# Patient Record
Sex: Female | Born: 1940
Health system: Southern US, Community
[De-identification: ages and names within clinical notes are randomized; demographics above are authoritative.]

## PROBLEM LIST (undated history)

## (undated) DIAGNOSIS — M1990 Unspecified osteoarthritis, unspecified site: Secondary | ICD-10-CM

## (undated) DIAGNOSIS — M722 Plantar fascial fibromatosis: Secondary | ICD-10-CM

## (undated) DIAGNOSIS — E669 Obesity, unspecified: Secondary | ICD-10-CM

## (undated) DIAGNOSIS — E66812 Obesity, class 2: Secondary | ICD-10-CM

## (undated) DIAGNOSIS — Z9981 Dependence on supplemental oxygen: Secondary | ICD-10-CM

## (undated) DIAGNOSIS — G4733 Obstructive sleep apnea (adult) (pediatric): Secondary | ICD-10-CM

## (undated) DIAGNOSIS — R0600 Dyspnea, unspecified: Secondary | ICD-10-CM

## (undated) DIAGNOSIS — I10 Essential (primary) hypertension: Secondary | ICD-10-CM

## (undated) DIAGNOSIS — D649 Anemia, unspecified: Secondary | ICD-10-CM

## (undated) DIAGNOSIS — B019 Varicella without complication: Secondary | ICD-10-CM

## (undated) DIAGNOSIS — J4540 Moderate persistent asthma, uncomplicated: Secondary | ICD-10-CM

## (undated) DIAGNOSIS — E785 Hyperlipidemia, unspecified: Secondary | ICD-10-CM

## (undated) DIAGNOSIS — G43909 Migraine, unspecified, not intractable, without status migrainosus: Secondary | ICD-10-CM

## (undated) HISTORY — DX: Migraine, unspecified, not intractable, without status migrainosus: G43.909

## (undated) HISTORY — DX: Unspecified osteoarthritis, unspecified site: M19.90

## (undated) HISTORY — DX: Obesity, unspecified: E66.9

## (undated) HISTORY — DX: Obesity, class 2: E66.812

## (undated) HISTORY — PX: OTHER SURGICAL HISTORY: SHX169

## (undated) HISTORY — DX: Essential (primary) hypertension: I10

## (undated) HISTORY — PX: CARPAL TUNNEL RELEASE: SHX101

## (undated) HISTORY — DX: Dyspnea, unspecified: R06.00

## (undated) HISTORY — DX: Hyperlipidemia, unspecified: E78.5

## (undated) HISTORY — DX: Obstructive sleep apnea (adult) (pediatric): G47.33

## (undated) HISTORY — PX: DILATION AND CURETTAGE OF UTERUS: SHX78

## (undated) HISTORY — DX: Varicella without complication: B01.9

## (undated) HISTORY — DX: Plantar fascial fibromatosis: M72.2

## (undated) SURGICAL SUPPLY — 1 items: MYOSURE XL FIBROID (MISCELLANEOUS) IMPLANT

---

## 2000-07-15 ENCOUNTER — Emergency Department (HOSPITAL_COMMUNITY): Admission: EM | Admit: 2000-07-15 | Discharge: 2000-07-16 | Payer: Self-pay | Admitting: Emergency Medicine

## 2000-07-16 ENCOUNTER — Encounter: Payer: Self-pay | Admitting: Emergency Medicine

## 2000-08-06 ENCOUNTER — Ambulatory Visit (HOSPITAL_COMMUNITY): Admission: RE | Admit: 2000-08-06 | Discharge: 2000-08-06 | Payer: Self-pay | Admitting: Cardiology

## 2000-08-06 ENCOUNTER — Encounter: Payer: Self-pay | Admitting: Cardiology

## 2002-07-20 ENCOUNTER — Ambulatory Visit (HOSPITAL_COMMUNITY): Admission: RE | Admit: 2002-07-20 | Discharge: 2002-07-20 | Payer: Self-pay | Admitting: Cardiology

## 2002-07-20 ENCOUNTER — Encounter: Payer: Self-pay | Admitting: Cardiology

## 2002-12-12 ENCOUNTER — Encounter: Payer: Self-pay | Admitting: Cardiology

## 2002-12-12 ENCOUNTER — Encounter: Admission: RE | Admit: 2002-12-12 | Discharge: 2002-12-12 | Payer: Self-pay | Admitting: Cardiology

## 2002-12-26 ENCOUNTER — Ambulatory Visit (HOSPITAL_BASED_OUTPATIENT_CLINIC_OR_DEPARTMENT_OTHER): Admission: RE | Admit: 2002-12-26 | Discharge: 2002-12-26 | Payer: Self-pay | Admitting: Orthopedic Surgery

## 2003-01-04 ENCOUNTER — Encounter: Admission: RE | Admit: 2003-01-04 | Discharge: 2003-04-04 | Payer: Self-pay | Admitting: Orthopedic Surgery

## 2003-02-14 ENCOUNTER — Encounter: Admission: RE | Admit: 2003-02-14 | Discharge: 2003-02-14 | Payer: Self-pay | Admitting: Cardiology

## 2003-03-26 ENCOUNTER — Encounter: Payer: Self-pay | Admitting: Cardiology

## 2003-03-26 ENCOUNTER — Ambulatory Visit (HOSPITAL_COMMUNITY): Admission: RE | Admit: 2003-03-26 | Discharge: 2003-03-26 | Payer: Self-pay | Admitting: Cardiology

## 2003-03-27 ENCOUNTER — Ambulatory Visit (HOSPITAL_COMMUNITY): Admission: RE | Admit: 2003-03-27 | Discharge: 2003-03-27 | Payer: Self-pay | Admitting: Cardiology

## 2003-03-28 ENCOUNTER — Ambulatory Visit (HOSPITAL_COMMUNITY): Admission: RE | Admit: 2003-03-28 | Discharge: 2003-03-28 | Payer: Self-pay | Admitting: Cardiology

## 2003-03-28 ENCOUNTER — Encounter: Payer: Self-pay | Admitting: Cardiology

## 2003-04-05 ENCOUNTER — Encounter: Admission: RE | Admit: 2003-04-05 | Discharge: 2003-07-04 | Payer: Self-pay | Admitting: Orthopedic Surgery

## 2003-05-29 ENCOUNTER — Encounter: Payer: Self-pay | Admitting: Orthopedic Surgery

## 2003-05-29 ENCOUNTER — Encounter: Admission: RE | Admit: 2003-05-29 | Discharge: 2003-05-29 | Payer: Self-pay | Admitting: Orthopedic Surgery

## 2003-12-07 ENCOUNTER — Encounter: Admission: RE | Admit: 2003-12-07 | Discharge: 2004-02-01 | Payer: Self-pay | Admitting: Orthopedic Surgery

## 2003-12-27 ENCOUNTER — Encounter: Admission: RE | Admit: 2003-12-27 | Discharge: 2003-12-27 | Payer: Self-pay | Admitting: Cardiology

## 2004-06-30 ENCOUNTER — Other Ambulatory Visit: Admission: RE | Admit: 2004-06-30 | Discharge: 2004-06-30 | Payer: Self-pay | Admitting: Obstetrics and Gynecology

## 2004-07-24 ENCOUNTER — Ambulatory Visit (HOSPITAL_COMMUNITY): Admission: RE | Admit: 2004-07-24 | Discharge: 2004-07-24 | Payer: Self-pay | Admitting: Obstetrics and Gynecology

## 2004-08-02 ENCOUNTER — Encounter (INDEPENDENT_AMBULATORY_CARE_PROVIDER_SITE_OTHER): Payer: Self-pay | Admitting: Specialist

## 2004-08-02 ENCOUNTER — Ambulatory Visit (HOSPITAL_COMMUNITY): Admission: RE | Admit: 2004-08-02 | Discharge: 2004-08-02 | Payer: Self-pay | Admitting: Obstetrics and Gynecology

## 2004-12-18 ENCOUNTER — Encounter: Admission: RE | Admit: 2004-12-18 | Discharge: 2005-02-10 | Payer: Self-pay | Admitting: Orthopedic Surgery

## 2005-01-07 ENCOUNTER — Encounter: Admission: RE | Admit: 2005-01-07 | Discharge: 2005-01-07 | Payer: Self-pay | Admitting: Cardiology

## 2008-10-31 ENCOUNTER — Ambulatory Visit (HOSPITAL_BASED_OUTPATIENT_CLINIC_OR_DEPARTMENT_OTHER): Admission: RE | Admit: 2008-10-31 | Discharge: 2008-10-31 | Payer: Self-pay | Admitting: Cardiology

## 2008-11-04 ENCOUNTER — Ambulatory Visit: Payer: Self-pay | Admitting: Internal Medicine

## 2009-01-20 ENCOUNTER — Encounter: Admission: RE | Admit: 2009-01-20 | Discharge: 2009-01-20 | Payer: Self-pay | Admitting: Sports Medicine

## 2009-02-13 ENCOUNTER — Encounter: Admission: RE | Admit: 2009-02-13 | Discharge: 2009-02-13 | Payer: Self-pay | Admitting: Cardiology

## 2009-11-01 ENCOUNTER — Encounter: Admission: RE | Admit: 2009-11-01 | Discharge: 2009-11-01 | Payer: Self-pay | Admitting: Cardiology

## 2010-01-01 ENCOUNTER — Ambulatory Visit (HOSPITAL_COMMUNITY): Admission: RE | Admit: 2010-01-01 | Discharge: 2010-01-01 | Payer: Self-pay | Admitting: Cardiology

## 2010-01-01 ENCOUNTER — Ambulatory Visit: Payer: Self-pay | Admitting: Vascular Surgery

## 2011-04-14 NOTE — Procedures (Signed)
NAME:  Heather Grant, PRIBBLE          ACCOUNT NO.:  [id number]   MEDICAL RECORD NO.:  [id number]          PATIENT TYPE:  OUT   LOCATION:  SLEEP CENTER                 FACILITY:  [facility]   PHYSICIAN:  Clinton D. Maple Hudson, MD, FCCP, FACPDATE OF BIRTH:  1941-01-17   DATE OF STUDY:  10/31/2008                            NOCTURNAL POLYSOMNOGRAM   REFERRING PHYSICIAN:   REFERRING PHYSICIAN:  Osvaldo Shipper. Spruill, MD   INDICATION FOR STUDY:  Insomnia with sleep apnea.   EPWORTH SLEEPINESS SCORE:  14/24. BMI 43.8. Weight 255 pounds. Height 64  inches. Neck 14 inches.   HOME MEDICATIONS:  Charted and reviewed.   SLEEP ARCHITECTURE:  Total sleep time 294 minutes with sleep efficiency  78.7%.  Stage I was 8.8%. Stage II  81.8%.  Stage III absent.  REM 9.3%  of total sleep time.  Sleep latency 13 minutes.  REM latency 158  minutes.  Awake after sleep onset 66 minutes.  Arousal index 23.2.  No  bedtime medication was taken.   RESPIRATORY DATA:  Apnea-hypopnea index (AHI) 5.7 per hour.  A total of  28 events were scored including 22 obstructive apneas and 6 hypopneas.  Events were not positional.  REM AHI 54.5.  Respiratory disturbance  index cutting subclinical events, which did not meet standard AHI  criteria, 10.8 per hour with a total of 25 events.  There were  insufficient events to permit CPAP titration by split protocol on the  study night.   OXYGEN DATA:  Moderate snoring with oxygen desaturation to a nadir of  78%.  Mean oxygen saturation through the study was 93.8% on room air.   CARDIAC DATA:  Sinus rhythm with occasional PVC and PAC.   MOVEMENT/PARASOMNIA:  No significant movement disturbance.  Bathroom x1.   IMPRESSION/RECOMMENDATION:  1. Very mild obstructive sleep apnea/hypopnea syndrome, AHI 5.7 per      hour with nonpositional events, moderate snoring at oxygen      desaturation to a nadir of 78%.  2. There were insufficient events to permit continuous positive airway  pressure titration by protocol on the study night.  Consider      conservative measures first such as weight loss, treatment for      nasal congestion, encourage her to sleep on flat back.  Continuous      positive airway pressure may be considered later if there is      sufficient      clinical concern.  Note that the patient states at home her sleep      is markedly disturbed by frequent bathroom trips; however, she only      used the rest room once on this study night.      Clinton D. Maple Hudson, MD, [facility], FACP  Diplomate, Biomedical engineer of Sleep Medicine  Electronically Signed     CDY/MEDQ  D:  11/04/2008 14:57:28  T:  11/05/2008 00:38:47  Job:  [id number]

## 2011-04-17 NOTE — Op Note (Signed)
NAME:  Heather Grant, Heather Grant                    ACCOUNT NO.:  [id number]   MEDICAL RECORD NO.:  [id number]                   PATIENT TYPE:  AMB   LOCATION:  SDC                                  FACILITY:  [facility]   PHYSICIAN:  Janine Limbo, M.D.            DATE OF BIRTH:  1941/10/21   DATE OF PROCEDURE:  08/02/2004  DATE OF DISCHARGE:  08/02/2004                                 OPERATIVE REPORT   PREOPERATIVE DIAGNOSIS:  Postmenopausal bleeding.   POSTOPERATIVE DIAGNOSIS:  Postmenopausal bleeding.   PROCEDURE:  Hysteroscopy with dilatation and curettage.   SURGEON:  Janine Limbo, M.D.   ANESTHESIA:  Monitored anesthetic control, paracervical block using 0.5%  Marcaine with epinephrine.   DISPOSITION:  [name] is a 70 year old female, para 4-0-1-4 who  presents with postmenopausal bleeding.  She understands the indications for  her procedure and she accepts the risk of, but not admitted to, anesthetic  complications, bleeding, infections, and possible damage to the surrounding  organs.   FINDINGS:  The patient was noted to have a small uterus.  No adnexal masses  were appreciated.  The uterus sounded to 6 to 7 cm.  On hysteroscopy, the  patient was found to have a small septum at the fundus of the uterus, but no  other abnormalities were noted.  There was some granulation tissue at the  endocervix.  There was a 0.2 cm fibroid in the endometrial cavity.   DESCRIPTION OF PROCEDURE:  The patient was taken to the operating room where  she was given medication through her IV line.  The perineum and vagina were  prepped with multiple layers of Betadine.  A Foley catheter was placed in  the bladder. The patient was sterilely draped. Examination under anesthesia  was performed. The paracervical block was placed using 10 cc of 0.5%  Marcaine with epinephrine.  An endocervical curettage was performed.  The  uterus sounded to 6 to 7 cm. The cervix was gradually dilated.  The  diagnostic hysteroscope was inserted and pictures were taken of the  patient's endometrial cavity.  No malignant pathology was identified.  The  diagnostic hysteroscope was removed and the cavity was curetted. Very little  material was present.  The hysteroscope was inserted and again, the cavity  was felt to clean.  Hemostasis was adequate.  The granulation tissue at the  endocervix was then cauterized using the bipolar cautery.  The cervix was  sounded and the cervix was noted to be patent.  All fluid was drained from  the uterus.  The patient was returned to the supine position after all  instruments were removed and her examination was repeated.  The patient was  taken to the recovery room in stable condition.  All sponge, needle and  instrument counts were correct on two occasions. The estimated blood loss  was less than 5 cc. The fluid deficit was 10 cc.   FOLLOW-UP INSTRUCTIONS:  The patient was given a prescription for Vicodin  and she will take one or two tablets every four hours as-needed for pain (30  tablets, one refill).  She was given a copy of the postoperative  instruction sheet as prepared by the [facility] for  patients who had undergone dilatation and curettage.  She will return to see  Dr. Stefano Gaul in two to three weeks for follow-up examination. She will call  for questions or concerns.                                               Janine Limbo, M.D.    AVS/MEDQ  D:  08/02/2004  T:  08/04/2004  Job:  [id number]   cc:   Osvaldo Shipper. Spruill, M.D.  [address] [id number]  [place]  [place] [address]  Fax: [phone]

## 2011-04-17 NOTE — H&P (Signed)
NAME:  Heather Grant, Heather Grant                    ACCOUNT NO.:  [id number]   MEDICAL RECORD NO.:  [id number]                   PATIENT TYPE:  AMB   LOCATION:  SDC                                  FACILITY:  [facility]   PHYSICIAN:  Janine Limbo, M.D.            DATE OF BIRTH:  12/13/1940   DATE OF ADMISSION:  08/02/2004  DATE OF DISCHARGE:                                HISTORY & PHYSICAL   HISTORY OF PRESENT ILLNESS:  Ms. Etherington is a 70 year old female, para 4-  0-1-4, who presents with postmenopausal bleeding.  The patient reports that  she uneventful menopause at age 77.  The patient presented for evaluation.  Gonorrhea and Chlamydia cultures were negative.  Her Pap smear was within  normal limits.  The patient was found to have cervical polyp present that  returned showing mucus and benign inflamed endocervical tissue.  An  ultrasound was performed and the uterus was noted to be normal size and no  adnexal masses were appreciated.  The endometrial thickness was 3 mm.  The  patient continued to have bleeding, however.  She presents now for  hysteroscopy with dilation and curettage.   PAST MEDICAL HISTORY:  The patient has a history of hypertension.  She also  has a history of migraine headaches.   CURRENT MEDICATIONS:  Her current medications include Diovan/hydrochloride,  hydrocodone p.r.n., and a medication for gout which she cannot remember.   DRUG ALLERGIES:  None known.   OBSTETRICAL HISTORY:  The patient has had five pregnancies in all.  She had  four term vaginal deliveries and one miscarriage.   SOCIAL HISTORY:  The patient drinks alcohol socially.  She denies cigarette  use and recreational drug use.   REVIEW OF SYSTEMS:  Noncontributory.   FAMILY HISTORY:  The patient has a son with sickle cell trait and asthma.  She has a daughter with migraine headaches.   PHYSICAL EXAMINATION:  VITAL SIGNS:  Weight is 256 pounds.  HEENT:  Within normal limits.  CHEST:   Clear.  HEART:  Regular rate and rhythm.  BREASTS:  Without masses.  ABDOMEN:  Nontender.  No fluid wave is present and no adenopathy is present.  PELVIC:  External genitalia is within normal limits.  The vagina is normal  except for relaxation, the cervix is normal and the endocervical glands are  easily seen on the exocervix.  The uterus is difficult to outline because of  obesity.  No adnexal masses are appreciated and rectovaginal exam confirms.   ASSESSMENT:  1.  Postmenopausal bleeding.  2.  Obesity.  3.  Hypertension.   PLAN:  The patient will undergo hysteroscopy with dilation and curettage.  The patient understands the indications for her procedure and she accepts  the risk of, but not limited to, anesthetic complications, bleeding,  infections, and possible damage to the surrounding organs.  Janine Limbo, M.D.    AVS/MEDQ  D:  08/01/2004  T:  08/01/2004  Job:  [id number]   cc:   Osvaldo Shipper. Spruill, M.D.  [address] [id number]  [place]  [place] [address]  Fax: [phone]

## 2011-04-17 NOTE — Op Note (Signed)
NAME:  Heather Grant, Heather Grant                    ACCOUNT NO.:  [id number]   MEDICAL RECORD NO.:  [id number]                   PATIENT TYPE:  AMB   LOCATION:  DSC                                  FACILITY:  [facility]   PHYSICIAN:  Cindee Salt, M.D.                    DATE OF BIRTH:  10/05/1941   DATE OF PROCEDURE:  12/26/2002  DATE OF DISCHARGE:                                 OPERATIVE REPORT   PREOPERATIVE DIAGNOSIS:  Carpal tunnel syndrome, right hand.   POSTOPERATIVE DIAGNOSIS:  Carpal tunnel syndrome, right hand.   OPERATION:  Release right carpal canal with __________ transfer palmaris  longus to abductor right hand thumb.   SURGEON:  Cindee Salt, M.D.   ASSISTANT:  Nurse specialist.   ANESTHESIA:  IV regional.   HISTORY:  The patient is a 70 year old female with a history of carpal  tunnel syndrome.  The EMG nerve conduction is positive in __________ of the  abductor and difficulty with opposition.  She is brought to the operating  room where an upper arm IV regional anesthetic was carried out without  difficulty.  She was prepped and draped using Duraprep and in the supine  position right arm free a longitudinal incision was made in the palmar,  carried to the ulnar side of the wrist and on to the forearm.  This was  carried down through subcutaneous tissue.  Bleeders were electrocauterized.  The palmaris longus was identified.  This was elongated with palmar fascia  distally and isolated using a large strip of palmar fascia maintaining the  overall side of the palmaris longus.  The superficial arch was then  identified to the ulnar side of the median nerve.  The carpal retinaculum  was incised with sharp dissection fully releasing the median nerve.  The  canal was explored.  No further lesions were identified.  The wound was  irrigated.  A tunnel was then made to a separate incision on the radial  aspect of the thumb metacarpophalangeal joint.  Down through subcutaneous  tissue the dorsal sensory nerve was identified and protected.  The abductor  opponens insertion was identified.  This was isolated.  A tunnel was made  for placement of the palmaris longus.  Any connections of the palmaris  longus were incised proximally and the end was then delivered distally after  placement of a modified capsular type suture proximally in to the tendon  palmaris fascia end.  This was clamped at the incision site of the  phalangeal joint and carpal canal released.  The wound was closed with  interrupted 5-0 Nylon sutures.  The tendon was then transferred by weaving  through the abductor pollicis brevis opponens insertion and sutured to  itself with figure-of-eight 4-0 Mersilene sutures.  This was done with the  thumb held in opposed position.  The wound was irrigated, the skin closed  with interrupted 5-0 Nylon sutures.  A  sterile compressive dressing and  splint to the wrist and thumb was applied.  The patient tolerated the  procedure well and was taken to the recovery room for observation in  satisfactory condition.  After deflation of the tourniquet all fingers were  immediately pink.   DISPOSITION:  She is discharged home to return to the [facility] of  [place] in one week on Vicodin and Keflex.                                               Cindee Salt, M.D.   Angelique Blonder  D:  12/26/2002  T:  12/26/2002  Job:  [id number]

## 2011-07-07 ENCOUNTER — Encounter: Payer: Self-pay | Admitting: Internal Medicine

## 2011-07-07 ENCOUNTER — Ambulatory Visit (INDEPENDENT_AMBULATORY_CARE_PROVIDER_SITE_OTHER): Payer: Medicare Other | Admitting: Internal Medicine

## 2011-07-07 DIAGNOSIS — E785 Hyperlipidemia, unspecified: Secondary | ICD-10-CM

## 2011-07-07 DIAGNOSIS — M109 Gout, unspecified: Secondary | ICD-10-CM

## 2011-07-07 DIAGNOSIS — Z0000 Encounter for general adult medical examination without abnormal findings: Secondary | ICD-10-CM

## 2011-07-07 DIAGNOSIS — M1990 Unspecified osteoarthritis, unspecified site: Secondary | ICD-10-CM

## 2011-07-07 DIAGNOSIS — I10 Essential (primary) hypertension: Secondary | ICD-10-CM

## 2011-07-07 DIAGNOSIS — E669 Obesity, unspecified: Secondary | ICD-10-CM

## 2011-07-07 DIAGNOSIS — Z6835 Body mass index (BMI) 35.0-35.9, adult: Secondary | ICD-10-CM

## 2011-07-07 DIAGNOSIS — G4733 Obstructive sleep apnea (adult) (pediatric): Secondary | ICD-10-CM

## 2011-07-07 DIAGNOSIS — G43909 Migraine, unspecified, not intractable, without status migrainosus: Secondary | ICD-10-CM

## 2011-07-07 MED ORDER — VALSARTAN 320 MG PO TABS: 320.0000 mg | ORAL_TABLET | Freq: Every day | ORAL | Status: DC

## 2011-07-07 MED ORDER — COLCHICINE 0.6 MG PO TABS: 0.6000 mg | ORAL_TABLET | Freq: Every day | ORAL | Status: DC

## 2011-07-07 MED ORDER — ALLOPURINOL 300 MG PO TABS: 300.0000 mg | ORAL_TABLET | Freq: Every day | ORAL | Status: DC

## 2011-07-07 MED ORDER — NABUMETONE 750 MG PO TABS: 750.0000 mg | ORAL_TABLET | Freq: Two times a day (BID) | ORAL | Status: DC
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
## 2011-07-08 ENCOUNTER — Encounter: Payer: Self-pay | Admitting: Internal Medicine

## 2011-07-08 DIAGNOSIS — G4733 Obstructive sleep apnea (adult) (pediatric): Secondary | ICD-10-CM | POA: Insufficient documentation

## 2011-07-08 DIAGNOSIS — M1990 Unspecified osteoarthritis, unspecified site: Secondary | ICD-10-CM | POA: Insufficient documentation

## 2011-07-08 DIAGNOSIS — E785 Hyperlipidemia, unspecified: Secondary | ICD-10-CM | POA: Insufficient documentation

## 2011-07-08 DIAGNOSIS — Z0000 Encounter for general adult medical examination without abnormal findings: Secondary | ICD-10-CM | POA: Insufficient documentation

## 2011-07-08 DIAGNOSIS — I10 Essential (primary) hypertension: Secondary | ICD-10-CM | POA: Insufficient documentation

## 2011-07-08 DIAGNOSIS — G43909 Migraine, unspecified, not intractable, without status migrainosus: Secondary | ICD-10-CM | POA: Insufficient documentation

## 2011-07-08 DIAGNOSIS — M109 Gout, unspecified: Secondary | ICD-10-CM | POA: Insufficient documentation

## 2011-07-08 NOTE — Assessment & Plan Note (Signed)
Reviewed last lab from Oct '11 - Total 250, HDL 44, LDL 180.   Plan - will discuss medical therapy at next visit - she is a candidate for statin medication.

## 2011-07-08 NOTE — Assessment & Plan Note (Signed)
Controlled with low frequency of headaches. Will discuss various treatment options at next office visit.

## 2011-07-08 NOTE — Assessment & Plan Note (Signed)
No acute medical complaints at todays visit. She will return for a more thorough exam. She will need repeat general labs with last study in Oct '11. Last colonoscopy was March '06 - normal study. Follow-up in 10 years. She had a myocardial stress test in '04 that was normal. Last mammogram in Dec '10 - will be due for repeat study in Dec '12.  Will need to inquire as to immunization status.

## 2011-07-08 NOTE — Assessment & Plan Note (Signed)
Patinet is not on CPAP and with an AHI 5.7 this is probably not indicated. She does have nocturnal hypoxemia per ONO-last study July '10.   Plan - continue nocturnal Oxygen

## 2011-07-08 NOTE — Assessment & Plan Note (Signed)
BP Readings from Last 3 Encounters:  07/07/11 142/86    Close to goal on valsartan alone. Will consider second agent although diuretics are relatively contra indicated due to gout.

## 2011-07-08 NOTE — Assessment & Plan Note (Signed)
She has been evaluated at [facility] - has OA knees that is approaching end stage.  Plan - continued medical management           Weight loss

## 2011-07-08 NOTE — Assessment & Plan Note (Signed)
Marked obesity which is a contributor to her medical problems.  Plan - weight loss via weight management: smart food choices, portion size control and exercise.           Goal - to loose 1-2 lbs per month.

## 2011-07-08 NOTE — Assessment & Plan Note (Signed)
No recent flare. She is taking allopurinol 300mg  daily. Last Uric Acid level Oct '11 high at 9.2  Plan - continue present dose allopurinol           Provided low purine diet information at next office visit.

## 2011-09-18 ENCOUNTER — Other Ambulatory Visit: Payer: Self-pay | Admitting: Internal Medicine

## 2011-09-18 DIAGNOSIS — Z1231 Encounter for screening mammogram for malignant neoplasm of breast: Secondary | ICD-10-CM

## 2011-10-06 ENCOUNTER — Ambulatory Visit
Admission: RE | Admit: 2011-10-06 | Discharge: 2011-10-06 | Disposition: A | Discharge status: Home / Self Care | Payer: Medicare Other | Source: Ambulatory Visit | Attending: Internal Medicine | Admitting: Internal Medicine

## 2011-10-06 DIAGNOSIS — Z1231 Encounter for screening mammogram for malignant neoplasm of breast: Secondary | ICD-10-CM

## 2011-10-13 ENCOUNTER — Ambulatory Visit (INDEPENDENT_AMBULATORY_CARE_PROVIDER_SITE_OTHER): Payer: Medicare Other | Admitting: Internal Medicine

## 2011-10-13 ENCOUNTER — Encounter: Payer: Self-pay | Admitting: Internal Medicine

## 2011-10-13 VITALS — BP 150/80 | HR 68 | Temp 98.0°F | Ht 60.0 in | Wt 264.0 lb

## 2011-10-13 DIAGNOSIS — I10 Essential (primary) hypertension: Secondary | ICD-10-CM

## 2011-10-13 DIAGNOSIS — G4733 Obstructive sleep apnea (adult) (pediatric): Secondary | ICD-10-CM

## 2011-10-13 MED ORDER — HYDROCHLOROTHIAZIDE 25 MG PO TABS: 25.0000 mg | ORAL_TABLET | Freq: Every day | ORAL | Status: DC

## 2011-10-13 NOTE — Assessment & Plan Note (Signed)
BP Readings from Last 3 Encounters:  10/13/11 150/80  07/07/11 142/86   Blood pressure is suboptimally controlled.   Plan will add HCTZ 25 mg once a day for better control of systolic BP

## 2011-10-13 NOTE — Patient Instructions (Signed)
Increased sleepiness - reviewed your sleep study from '09 and the oxygen study from '10. No sleep apnea. You may have a problem with sleep disruption due to nocturia (getting up to pee). Plan - trial of vesicare 5 mg at bedtime to reduce the urinary frequency and to let you sleep better.  Blood pressure is too high today and was borderline at your last visit. Plan add HCTZ 25 mg once a day, with your diovan, to get better control of your blood pressure.
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
## 2011-10-17 NOTE — Assessment & Plan Note (Signed)
Her increase somnolence is unlikely to be due to OSA given low AHI.

## 2011-10-29 ENCOUNTER — Telehealth: Payer: Self-pay | Admitting: *Deleted

## 2011-10-29 NOTE — Telephone Encounter (Signed)
May stop medication. Will nee f/u ov in 1-2 weeks to manage hypertension

## 2011-10-29 NOTE — Telephone Encounter (Signed)
Pt c/o "striking pains in head & eyes" she believes to be side effect of HCTZ; states she cannot take this medication. Please advise.

## 2011-11-02 NOTE — Telephone Encounter (Signed)
LMOM: Greatly apologize for delay in return call--LMOM for patient to call back and schedule f/u OV.

## 2011-11-09 NOTE — Telephone Encounter (Signed)
ROV 12/18

## 2011-11-17 ENCOUNTER — Ambulatory Visit: Payer: Medicare Other | Admitting: Internal Medicine

## 2011-12-11 ENCOUNTER — Ambulatory Visit
Admission: RE | Admit: 2011-12-11 | Discharge: 2011-12-11 | Disposition: A | Discharge status: Home / Self Care | Payer: Medicare Other | Source: Ambulatory Visit | Attending: Orthopedic Surgery | Admitting: Orthopedic Surgery

## 2011-12-11 ENCOUNTER — Other Ambulatory Visit: Payer: Self-pay | Admitting: Orthopedic Surgery

## 2011-12-11 DIAGNOSIS — T79A0XA Compartment syndrome, unspecified, initial encounter: Secondary | ICD-10-CM

## 2012-01-28 ENCOUNTER — Ambulatory Visit (INDEPENDENT_AMBULATORY_CARE_PROVIDER_SITE_OTHER): Payer: Medicare Other | Admitting: Internal Medicine

## 2012-01-28 ENCOUNTER — Encounter: Payer: Self-pay | Admitting: Internal Medicine

## 2012-01-28 ENCOUNTER — Ambulatory Visit (INDEPENDENT_AMBULATORY_CARE_PROVIDER_SITE_OTHER)
Admission: RE | Admit: 2012-01-28 | Discharge: 2012-01-28 | Disposition: A | Discharge status: Home / Self Care | Payer: Medicare Other | Source: Ambulatory Visit | Attending: Internal Medicine | Admitting: Internal Medicine

## 2012-01-28 ENCOUNTER — Other Ambulatory Visit (INDEPENDENT_AMBULATORY_CARE_PROVIDER_SITE_OTHER): Payer: Medicare Other

## 2012-01-28 DIAGNOSIS — E785 Hyperlipidemia, unspecified: Secondary | ICD-10-CM

## 2012-01-28 DIAGNOSIS — M5432 Sciatica, left side: Secondary | ICD-10-CM

## 2012-01-28 DIAGNOSIS — R0609 Other forms of dyspnea: Secondary | ICD-10-CM

## 2012-01-28 DIAGNOSIS — R0989 Other specified symptoms and signs involving the circulatory and respiratory systems: Secondary | ICD-10-CM

## 2012-01-28 DIAGNOSIS — R0600 Dyspnea, unspecified: Secondary | ICD-10-CM

## 2012-01-28 DIAGNOSIS — M109 Gout, unspecified: Secondary | ICD-10-CM

## 2012-01-28 DIAGNOSIS — M5430 Sciatica, unspecified side: Secondary | ICD-10-CM

## 2012-01-28 DIAGNOSIS — I10 Essential (primary) hypertension: Secondary | ICD-10-CM

## 2012-01-28 DIAGNOSIS — R51 Headache: Secondary | ICD-10-CM

## 2012-01-28 DIAGNOSIS — E669 Obesity, unspecified: Secondary | ICD-10-CM

## 2012-01-28 LAB — LIPID PANEL
Cholesterol: 268 mg/dL — ABNORMAL HIGH (ref 0–200)
HDL: 46.9 mg/dL (ref >39.00)
Total CHOL/HDL Ratio: 6
Triglycerides: 202 mg/dL — ABNORMAL HIGH (ref 0.0–149.0)
VLDL: 40.4 mg/dL — ABNORMAL HIGH (ref 0.0–40.0)

## 2012-01-28 LAB — COMPREHENSIVE METABOLIC PANEL
ALT: 20 U/L (ref 0–35)
AST: 18 U/L (ref 0–37)
Albumin: 3.8 g/dL (ref 3.5–5.2)
Alkaline Phosphatase: 58 U/L (ref 39–117)
BUN: 13 mg/dL (ref 6–23)
CO2: 28 mEq/L (ref 19–32)
Calcium: 9.9 mg/dL (ref 8.4–10.5)
Chloride: 106 mEq/L (ref 96–112)
Creatinine, Ser: 1.2 mg/dL (ref 0.4–1.2)
GFR: 58.41 mL/min — ABNORMAL LOW (ref >60.00)
Glucose, Bld: 92 mg/dL (ref 70–99)
Potassium: 4.1 mEq/L (ref 3.5–5.1)
Sodium: 143 mEq/L (ref 135–145)
Total Bilirubin: 0.3 mg/dL (ref 0.3–1.2)
Total Protein: 6.9 g/dL (ref 6.0–8.3)

## 2012-01-28 LAB — LDL CHOLESTEROL, DIRECT: Direct LDL: 198.8 mg/dL

## 2012-01-28 LAB — BRAIN NATRIURETIC PEPTIDE: Pro B Natriuretic peptide (BNP): 41 pg/mL (ref 0.0–100.0)

## 2012-01-28 LAB — URIC ACID: Uric Acid, Serum: 9.4 mg/dL — ABNORMAL HIGH (ref 2.4–7.0)

## 2012-01-28 LAB — SEDIMENTATION RATE: Sed Rate: 47 mm/hr — ABNORMAL HIGH (ref 0–22)

## 2012-01-28 MED ORDER — METOPROLOL SUCCINATE ER 25 MG PO TB24: 25.0000 mg | ORAL_TABLET | Freq: Every day | ORAL | Status: DC
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
## 2012-01-28 NOTE — Patient Instructions (Signed)
Blood pressure - is a little high. Heart rate up a little. Plan - toprol xl 25 mg once a day, continue diovan 320 mg  Headache- will check a sed rate to be sure the headache isn't coming from inflammation of the blood vessels. For headache pain it is ok to take [facility] or advil  Shortness of breath with wheezing- will be sure lungs are clear with a chest x-ray. Will check to be sure this is not a heart problem by getting a 2 D echo - an ultrasound of the heart - that will be done at [facility] on [address] next week.   Leg pain - you have pain that is more suggestive of sciatica then hip pain. Plan - x-rays of the lumbar spine looking for evidence of a pinched nerve. For pain try taking tylenol 1000 mg three times a day on schedule.   Gout - will check a uric acid level to be sure there is good control to prevent the gout.

## 2012-01-30 DIAGNOSIS — R519 Headache, unspecified: Secondary | ICD-10-CM | POA: Insufficient documentation

## 2012-01-30 DIAGNOSIS — R51 Headache: Secondary | ICD-10-CM | POA: Insufficient documentation

## 2012-01-30 DIAGNOSIS — R0609 Other forms of dyspnea: Secondary | ICD-10-CM | POA: Insufficient documentation

## 2012-01-30 DIAGNOSIS — R0600 Dyspnea, unspecified: Secondary | ICD-10-CM | POA: Insufficient documentation

## 2012-01-30 MED ORDER — SIMVASTATIN 20 MG PO TABS: 20.0000 mg | ORAL_TABLET | Freq: Every day | ORAL | Status: DC

## 2012-01-30 NOTE — Assessment & Plan Note (Signed)
Frontal and templar headache. Non focal neuro exam. BP is elevated.  Plan - BP control           Rule out arteritis           Tylenol 1000 mg three times a day for leg pain may also address headache. If persistent Headache will add NSAIDs.  Addendum - ESR 47 - mild elevation but not suggestive of Templar arteritis

## 2012-01-30 NOTE — Assessment & Plan Note (Signed)
ON exam the hip seems fine. Her back exam was without significant radiculopathy. L-S spine films do reveal degenerative disk disease.  Plan - Tylenol 1000 mg three times a day as primary medical therapy            Daily back exercises            Weight Management

## 2012-01-30 NOTE — Assessment & Plan Note (Addendum)
No recent flares. Uric Acid 9.4 - too high.  Plan - check with patient as to adherence to allopurinol 300mg  daily regimen

## 2012-01-30 NOTE — Assessment & Plan Note (Addendum)
BP Readings from Last 3 Encounters:  01/28/12 142/78  10/13/11 150/80  07/07/11 142/86   Sub optimal control on present medication - ARB. She was intolerant of HCTZ.  Plan - add toprolol XL 25 mg qd          Follow-up BP check

## 2012-01-30 NOTE — Assessment & Plan Note (Addendum)
LDL 199!! On no medication.  Plan - simvastatin 20 mg daily           Follow-up lab in 4 weeks

## 2012-01-30 NOTE — Assessment & Plan Note (Signed)
Stressed that obesity was her #1 health problem and make a major contribution to all her other problems! Wt Readings from Last 3 Encounters:  01/28/12 270 lb 6 oz (122.641 kg)  10/13/11 264 lb (119.75 kg)  07/07/11 261 lb (118.389 kg)    Plan - weight management: stop all sugared beverages: sodas, sweetened tea. Smart food choice- avoid fast food, fried food. PORTION SIZE CONTROL - but eat 3 moderate meals a day plus a bedtime snack.           Target weight - 200 lbs; goal is to loose 2 lbs per month -  a 4-6 year project but with distinct benefits on the way to the target in regard to BP control, joint pain, cholesterol management.

## 2012-01-30 NOTE — Assessment & Plan Note (Signed)
Very high risk for CAD and ischemic cardiomyopathy with "metabolic syndrome" - hypertension, Diabetes, hyperlipidemia and obesity -  in addition to deconditioning in a setting of obesity.  Plan - 2 D echo           Metabolic risk reduction.

## 2012-02-10 ENCOUNTER — Ambulatory Visit (HOSPITAL_COMMUNITY): Payer: Medicare Other | Attending: Cardiology

## 2012-02-10 ENCOUNTER — Other Ambulatory Visit: Payer: Self-pay

## 2012-02-10 DIAGNOSIS — E785 Hyperlipidemia, unspecified: Secondary | ICD-10-CM | POA: Insufficient documentation

## 2012-02-10 DIAGNOSIS — R0600 Dyspnea, unspecified: Secondary | ICD-10-CM

## 2012-02-10 DIAGNOSIS — I10 Essential (primary) hypertension: Secondary | ICD-10-CM | POA: Insufficient documentation

## 2012-02-10 DIAGNOSIS — I059 Rheumatic mitral valve disease, unspecified: Secondary | ICD-10-CM | POA: Insufficient documentation

## 2012-02-10 DIAGNOSIS — R0989 Other specified symptoms and signs involving the circulatory and respiratory systems: Secondary | ICD-10-CM | POA: Insufficient documentation

## 2012-02-10 DIAGNOSIS — I079 Rheumatic tricuspid valve disease, unspecified: Secondary | ICD-10-CM | POA: Insufficient documentation

## 2012-02-10 DIAGNOSIS — R0602 Shortness of breath: Secondary | ICD-10-CM

## 2012-02-10 DIAGNOSIS — R0609 Other forms of dyspnea: Secondary | ICD-10-CM | POA: Insufficient documentation

## 2012-02-10 DIAGNOSIS — E669 Obesity, unspecified: Secondary | ICD-10-CM | POA: Insufficient documentation

## 2012-02-12 ENCOUNTER — Encounter: Payer: Self-pay | Admitting: Internal Medicine

## 2012-03-16 ENCOUNTER — Other Ambulatory Visit: Payer: Self-pay | Admitting: Orthopedic Surgery

## 2012-03-16 ENCOUNTER — Ambulatory Visit
Admission: RE | Admit: 2012-03-16 | Discharge: 2012-03-16 | Disposition: A | Discharge status: Home / Self Care | Payer: Medicare Other | Source: Ambulatory Visit | Attending: Orthopedic Surgery | Admitting: Orthopedic Surgery

## 2012-03-16 DIAGNOSIS — M549 Dorsalgia, unspecified: Secondary | ICD-10-CM

## 2012-06-15 ENCOUNTER — Encounter: Payer: Self-pay | Admitting: Internal Medicine

## 2012-06-15 ENCOUNTER — Ambulatory Visit (INDEPENDENT_AMBULATORY_CARE_PROVIDER_SITE_OTHER): Payer: Medicare Other | Admitting: Internal Medicine

## 2012-06-15 VITALS — BP 142/70 | HR 71 | Temp 98.1°F | Resp 16 | Wt 272.0 lb

## 2012-06-15 DIAGNOSIS — H60399 Other infective otitis externa, unspecified ear: Secondary | ICD-10-CM

## 2012-06-15 DIAGNOSIS — I10 Essential (primary) hypertension: Secondary | ICD-10-CM

## 2012-06-15 DIAGNOSIS — H6090 Unspecified otitis externa, unspecified ear: Secondary | ICD-10-CM

## 2012-06-15 MED ORDER — NEOMYCIN-POLYMYXIN-HC 3.5-10000-1 OT SOLN: 3.0000 [drp] | Freq: Four times a day (QID) | OTIC | Status: AC

## 2012-06-15 NOTE — Progress Notes (Signed)
  Subjective:    Patient ID: Heather Grant, female    DOB: 11-16-1941, 71 y.o.   MRN: [id number]  HPI Heather Grant presents for pain in the left ear. She had a "risen" which she treated with peroxide. She has had ear pain for several years. She has not had any drainage. She feels like she has had hearing loss. No fever, chills. No enlarged nodes.  PMH, FamHx and SocHx reviewed for any changes and relevance.    Review of Systems System review is negative for any constitutional, cardiac, pulmonary, GI or neuro symptoms or complaints other than as described in the HPI.     Objective:   Physical Exam Filed Vitals:   06/15/12 1037  BP: 142/70  Pulse: 71  Temp: 98.1 F (36.7 C)  Resp: 16   Wt Readings from Last 3 Encounters:  06/15/12 272 lb (123.378 kg)  01/28/12 270 lb 6 oz (122.641 kg)  10/13/11 264 lb (119.75 kg)   Gen'l- obese AA woman in no distress HEENT - EAC/TM right -ok; EAC left with scant cerumen, no lesion. TM normal Cor- 2+ radial RRR       Assessment & Plan:  Ear pain - minor otitis externa  Plan- cortisporin qtts to sore ear.

## 2012-06-15 NOTE — Patient Instructions (Addendum)
Blood pressure looks better. Continue your medications.  Ear pain - mild inflammation of the left ear canal. The ear drum looks fine. Plan - generic cortisporin ear drops as directed for 7 days.   Return for annual wellness exam in August.

## 2012-06-18 NOTE — Assessment & Plan Note (Signed)
BP Readings from Last 3 Encounters:  06/15/12 142/70  01/28/12 142/78  10/13/11 150/80   Borderline control on BB, ARB high dose, HCTZ  Plan- will recheck in August - next step is to change to loop diuretic.

## 2012-06-26 ENCOUNTER — Other Ambulatory Visit: Payer: Self-pay | Admitting: Internal Medicine

## 2012-07-22 ENCOUNTER — Other Ambulatory Visit: Payer: Self-pay | Admitting: Orthopedic Surgery

## 2012-07-22 ENCOUNTER — Ambulatory Visit
Admission: RE | Admit: 2012-07-22 | Discharge: 2012-07-22 | Disposition: A | Discharge status: Home / Self Care | Payer: Medicare Other | Source: Ambulatory Visit | Attending: Orthopedic Surgery | Admitting: Orthopedic Surgery

## 2012-07-22 DIAGNOSIS — M1990 Unspecified osteoarthritis, unspecified site: Secondary | ICD-10-CM

## 2012-08-23 ENCOUNTER — Ambulatory Visit (INDEPENDENT_AMBULATORY_CARE_PROVIDER_SITE_OTHER): Payer: Medicare Other | Admitting: Internal Medicine

## 2012-08-23 ENCOUNTER — Encounter: Payer: Self-pay | Admitting: Internal Medicine

## 2012-08-23 VITALS — BP 124/62 | HR 84 | Temp 97.2°F | Resp 16 | Wt 261.0 lb

## 2012-08-23 DIAGNOSIS — G4733 Obstructive sleep apnea (adult) (pediatric): Secondary | ICD-10-CM

## 2012-08-23 DIAGNOSIS — Z0000 Encounter for general adult medical examination without abnormal findings: Secondary | ICD-10-CM

## 2012-08-23 DIAGNOSIS — M109 Gout, unspecified: Secondary | ICD-10-CM

## 2012-08-23 DIAGNOSIS — E669 Obesity, unspecified: Secondary | ICD-10-CM

## 2012-08-23 DIAGNOSIS — E785 Hyperlipidemia, unspecified: Secondary | ICD-10-CM

## 2012-08-23 DIAGNOSIS — Z23 Encounter for immunization: Secondary | ICD-10-CM

## 2012-08-23 DIAGNOSIS — I10 Essential (primary) hypertension: Secondary | ICD-10-CM

## 2012-08-23 MED ORDER — LOVASTATIN 40 MG PO TABS: 40.0000 mg | ORAL_TABLET | Freq: Every day | ORAL | Status: DC

## 2012-08-23 NOTE — Patient Instructions (Addendum)
Blood pressure - 124/ 62 VERY GOOD. Plan  Continue present medications  Cholesterol - the bad cholesterol was 199 with a goal of 130 or less. Plan   a trial of a different "statin" drug - lovastatin 40 mg once a day (generic)  Return for lab work in 4 weeks.  Orthopedics - will defer diagnosis and treatment to Dr. Montez Morita  Gout - no recent flares. The allopurinol is to prevent gout by lowering the uric acid. The Cochicine is for an acute flare of gout - a red, hot, swollen and exquistely painful joint. For a gout flare take two (2) colchicine tablets for relief and you may take an additional colchicine in 1 hr if needed but that is the limit.The Relafen taken daily if for generalized arthritic pain. You sahould follow a low purine diet.  Immunizations: you should have tetanus booster today and the pneumonia vaccine. You can return in 3 weeks for the Shingles vaccine. Flu shot 3 weeks after that if you are interested.
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
## 2012-08-26 NOTE — Assessment & Plan Note (Signed)
Cholesterol - the bad cholesterol was 199 with a goal of 130 or less. Plan   a trial of a different "statin" drug - lovastatin 40 mg once a day (generic)  Return for lab work in 4 weeks.

## 2012-08-26 NOTE — Assessment & Plan Note (Signed)
Interval history w/o major illness, injury or surgery. Physical exam notable for obesity but otherwise no significant findings. She will return for lab in 1 month.  She is current with colorectal and breast cancer screening. Immunizations : tetanus and pneumonia shots today, flu and shingles immunizations recommended.   In summary - a nice woman who needs to address weight which is her main health risk problem. Her other chronic diseases are seemingly stable except for lipid issues - which are addressed today. She will return for lab as instructed with follow-up as indicated.

## 2012-08-26 NOTE — Assessment & Plan Note (Signed)
No recent flares  Plan - Uric acid level with next lab draw with recommendations to follow.

## 2012-08-26 NOTE — Assessment & Plan Note (Signed)
BP Readings from Last 3 Encounters:  08/23/12 124/62  06/15/12 142/70  01/28/12 142/78   Adequate control at today's visit  Plan - continue present medications.

## 2012-08-26 NOTE — Assessment & Plan Note (Signed)
Wt Readings from Last 3 Encounters:  08/23/12 261 lb (118.389 kg)  06/15/12 272 lb (123.378 kg)  01/28/12 270 lb 6 oz (122.641 kg)   Reemphasized the importance of weight management: smart food choices, portions size control and trying to get aerobic exercise at least 3 times a week.

## 2012-09-01 ENCOUNTER — Other Ambulatory Visit: Payer: Self-pay | Admitting: Internal Medicine

## 2012-09-01 DIAGNOSIS — Z1231 Encounter for screening mammogram for malignant neoplasm of breast: Secondary | ICD-10-CM

## 2012-09-12 ENCOUNTER — Encounter: Payer: Self-pay | Admitting: Obstetrics and Gynecology

## 2012-09-12 ENCOUNTER — Ambulatory Visit (INDEPENDENT_AMBULATORY_CARE_PROVIDER_SITE_OTHER): Payer: Medicare Other | Admitting: Obstetrics and Gynecology

## 2012-09-12 VITALS — BP 140/84 | Resp 18 | Ht 59.5 in | Wt 226.0 lb

## 2012-09-12 DIAGNOSIS — E669 Obesity, unspecified: Secondary | ICD-10-CM

## 2012-09-12 DIAGNOSIS — L538 Other specified erythematous conditions: Secondary | ICD-10-CM

## 2012-09-12 DIAGNOSIS — L293 Anogenital pruritus, unspecified: Secondary | ICD-10-CM

## 2012-09-12 DIAGNOSIS — N760 Acute vaginitis: Secondary | ICD-10-CM

## 2012-09-12 DIAGNOSIS — Z01419 Encounter for gynecological examination (general) (routine) without abnormal findings: Secondary | ICD-10-CM

## 2012-09-12 DIAGNOSIS — N898 Other specified noninflammatory disorders of vagina: Secondary | ICD-10-CM

## 2012-09-12 DIAGNOSIS — N762 Acute vulvitis: Secondary | ICD-10-CM

## 2012-09-12 DIAGNOSIS — Z124 Encounter for screening for malignant neoplasm of cervix: Secondary | ICD-10-CM

## 2012-09-12 DIAGNOSIS — L304 Erythema intertrigo: Secondary | ICD-10-CM

## 2012-09-12 LAB — POCT WET PREP (WET MOUNT)
Whiff Test: NEGATIVE
pH: 4.5

## 2012-09-12 MED ORDER — DOMEBORO 25 % EX PACK: 1.0000 | PACK | Freq: Every day | CUTANEOUS | Status: DC

## 2012-09-12 MED ORDER — NYSTATIN 100000 UNIT/GM EX POWD: Freq: Four times a day (QID) | CUTANEOUS | Status: DC

## 2012-09-12 MED ORDER — FLUCONAZOLE 150 MG PO TABS: 150.0000 mg | ORAL_TABLET | Freq: Every day | ORAL | Status: DC

## 2012-09-12 MED ORDER — NYSTATIN-TRIAMCINOLONE 100000-0.1 UNIT/GM-% EX OINT: TOPICAL_OINTMENT | Freq: Two times a day (BID) | CUTANEOUS | Status: DC

## 2012-09-12 NOTE — Progress Notes (Signed)
Subjective:    Heather Grant is a 71 y.o. female No obstetric history on file. who presents for annual exam. The patient complains of vaginal itching and vulvar itching.  She also has itching between her thighs.  The itching and under her breasts and under her panniculus is better than it one swabs. She sweats a lot in these areas.  Her powder has not worked as well as she had hoped. Her BMI is greater than 40. She has multiple medical problems including arthritis, hypertension, and gout.  The following portions of the patient's history were reviewed and updated as appropriate: allergies, current medications, past family history, past medical history, past social history, past surgical history and problem list.  Review of Systems Pertinent items are noted in HPI. Gastrointestinal:No change in bowel habits, no abdominal pain, no rectal bleeding Genitourinary:negative for dysuria, frequency, hematuria, nocturia and urinary incontinence    Objective:     BP 140/84  Resp 18  Ht 4' 11.5" (1.511 m)  Wt 226 lb (102.513 kg)  BMI 44.88 kg/m2  Weight:  Wt Readings from Last 1 Encounters:  09/12/12 226 lb (102.513 kg)     BMI: Body mass index is 44.88 kg/(m^2). General Appearance: Alert, appropriate appearance for age. No acute distress HEENT: Grossly normal Neck / Thyroid: Supple, no masses, nodes or enlargement Lungs: clear to auscultation bilaterally Back: No CVA tenderness Breast Exam: No masses or nodes.No dimpling, nipple retraction or discharge. Cardiovascular: Regular rate and rhythm. S1, S2, no murmur Gastrointestinal: Soft, non-tender, no masses or organomegaly Moist skin under breasts, under a panniculus, and between her thighs  ++++++++++++++++++++++++++++++++++++++++++++++++++++++++  Pelvic Exam: External genitalia: normal general appearance Vaginal: normal without tenderness, induration or masses and relaxation noted Cervix: normal appearance Adnexa: normal bimanual  exam and difficult to examine due to obesity Uterus: difficult to outline due to obesity Rectovaginal: normal rectal, no masses  Wet prep: Questionable yeast  ++++++++++++++++++++++++++++++++++++++++++++++++++++++++  Lymphatic Exam: Non-palpable nodes in neck, clavicular, axillary, or inguinal regions  Psychiatric: Alert and oriented, appropriate affect.@OBJECTIVEEN @      Assessment:    Menopause   Overweight or obese: Yes  Pelvic relaxation: Yes  Menopausal symptoms: No. Severe: No.  Vaginal and vulvar itching due to chronic vulvitis due to intertrigo due to obesity.   Plan:    Mammogram. Pap smear. bone density scan   Medications:  Diflucan 150 mg 1 tablet by mouth each day for 3 days  Mycolog cream or ointment twice daily as needed  Nystatin powder after each pass  Domeboro solution 25% daily to help keep the skin dry  Follow-up:  for annual exam  The updated Pap smear screening guidelines were discussed with the patient. The patient requested that I obtain a Pap smear: Yes.  Kegel exercises discussed: Yes.  Proper diet and regular exercise were reviewed.  Annual mammograms recommended starting at age 70. Proper breast care was discussed.  Screening colonoscopy is recommended beginning at age 50.  Regular health maintenance was reviewed.  Sleep hygiene was discussed.  Adequate calcium and vitamin D intake was emphasized.  Leonard Schwartz M.D.   Regular Periods: no Mammogram: yes 2012 "WNL"  Monthly Breast Ex.: yes Exercise: yes  Tetanus < 10 years: yes Seatbelts: yes  NI. Bladder Functn.: yes Abuse at home: no  Daily BM's: yes Stressful Work: no  Healthy Diet: yes Sigmoid-Colonoscopy: 2007  Calcium: no Medical problems this year: pt c/o vaginal itching.    LAST PAP:11/06/2009 "WNL"  Contraception: [employer]  Mammogram:  2012 pt has one scheduled for this year.   PCP: Dr.Nora  PMH: No Changes  FMH: No Changes   Last Bone Scan:  Never   Odor: no Fever: no Pelvic Pain: no  Itching: yes Dyspareunia: no Desires GC/CT: no  Thin: no History of PID: no Desires HIV,RPR,HbsAG: no  Thick: no History of STD: no Other: pt states she thinks there may be a "bump in vagina/rectum area.

## 2012-09-13 LAB — PAP IG W/ RFLX HPV ASCU

## 2012-09-21 ENCOUNTER — Ambulatory Visit: Payer: Medicare Other | Admitting: Internal Medicine

## 2012-09-21 ENCOUNTER — Encounter: Payer: Self-pay | Admitting: Internal Medicine

## 2012-09-21 ENCOUNTER — Ambulatory Visit (INDEPENDENT_AMBULATORY_CARE_PROVIDER_SITE_OTHER): Payer: Medicare Other | Admitting: Internal Medicine

## 2012-09-21 ENCOUNTER — Other Ambulatory Visit (INDEPENDENT_AMBULATORY_CARE_PROVIDER_SITE_OTHER): Payer: Medicare Other

## 2012-09-21 VITALS — BP 132/80 | HR 64 | Temp 97.9°F | Resp 16 | Wt 266.0 lb

## 2012-09-21 DIAGNOSIS — M109 Gout, unspecified: Secondary | ICD-10-CM

## 2012-09-21 DIAGNOSIS — E785 Hyperlipidemia, unspecified: Secondary | ICD-10-CM

## 2012-09-21 DIAGNOSIS — I10 Essential (primary) hypertension: Secondary | ICD-10-CM

## 2012-09-21 DIAGNOSIS — Z23 Encounter for immunization: Secondary | ICD-10-CM

## 2012-09-21 DIAGNOSIS — G4733 Obstructive sleep apnea (adult) (pediatric): Secondary | ICD-10-CM

## 2012-09-21 LAB — COMPREHENSIVE METABOLIC PANEL
ALT: 24 U/L (ref 0–35)
AST: 20 U/L (ref 0–37)
Albumin: 3.8 g/dL (ref 3.5–5.2)
Alkaline Phosphatase: 65 U/L (ref 39–117)
BUN: 13 mg/dL (ref 6–23)
CO2: 28 mEq/L (ref 19–32)
Calcium: 10 mg/dL (ref 8.4–10.5)
Chloride: 106 mEq/L (ref 96–112)
Creatinine, Ser: 1.2 mg/dL (ref 0.4–1.2)
GFR: 58.3 mL/min — ABNORMAL LOW (ref >60.00)
Glucose, Bld: 99 mg/dL (ref 70–99)
Potassium: 4.7 mEq/L (ref 3.5–5.1)
Sodium: 142 mEq/L (ref 135–145)
Total Bilirubin: 0.7 mg/dL (ref 0.3–1.2)
Total Protein: 7.5 g/dL (ref 6.0–8.3)

## 2012-09-21 LAB — HEPATIC FUNCTION PANEL
ALT: 24 U/L (ref 0–35)
AST: 20 U/L (ref 0–37)
Albumin: 3.8 g/dL (ref 3.5–5.2)
Alkaline Phosphatase: 65 U/L (ref 39–117)
Bilirubin, Direct: 0.1 mg/dL (ref 0.0–0.3)
Total Bilirubin: 0.7 mg/dL (ref 0.3–1.2)
Total Protein: 7.5 g/dL (ref 6.0–8.3)

## 2012-09-21 LAB — LDL CHOLESTEROL, DIRECT: Direct LDL: 192.2 mg/dL

## 2012-09-21 LAB — LIPID PANEL
Cholesterol: 257 mg/dL — ABNORMAL HIGH (ref 0–200)
HDL: 40.1 mg/dL (ref >39.00)
Total CHOL/HDL Ratio: 6
Triglycerides: 188 mg/dL — ABNORMAL HIGH (ref 0.0–149.0)
VLDL: 37.6 mg/dL (ref 0.0–40.0)

## 2012-09-21 LAB — URIC ACID: Uric Acid, Serum: 7.5 mg/dL — ABNORMAL HIGH (ref 2.4–7.0)

## 2012-09-21 MED ORDER — PRAVASTATIN SODIUM 40 MG PO TABS: 40.0000 mg | ORAL_TABLET | Freq: Every day | ORAL | Status: DC

## 2012-09-21 NOTE — Assessment & Plan Note (Signed)
Patient is on nocturnal oxygen.

## 2012-09-21 NOTE — Assessment & Plan Note (Signed)
Very high LDL in a setting of multiple risk factors. Explained risks and treatments to the patient.  Plan   pravastatin 40 mg once a day  Lab in 1 month

## 2012-09-21 NOTE — Patient Instructions (Addendum)
Immunization - you do need to have shingles vaccine and you should consider having a flu shot.  Cholesterol is really too high. You do need medication to bring it down. Sorry you had belly pain with lovastatin. Plan-  We'll try pravastatin 40 mg once a day  Follow-up lab in 1 month.   Blood pressure is looking fine.  Knees - osteoarthritis of both knees: if you can bear weight and walk and if you are not having a lot of pain or having your knees give out on you it is ok to wait on having any surgery.
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
## 2012-09-21 NOTE — Assessment & Plan Note (Signed)
No recent flares of gout.  Plan Uric acid level with next lab draw. recommendations to follow

## 2012-09-30 ENCOUNTER — Encounter: Payer: Self-pay | Admitting: Internal Medicine

## 2012-10-06 ENCOUNTER — Ambulatory Visit
Admission: RE | Admit: 2012-10-06 | Discharge: 2012-10-06 | Disposition: A | Discharge status: Home / Self Care | Payer: Medicare Other | Source: Ambulatory Visit | Attending: Internal Medicine | Admitting: Internal Medicine

## 2012-10-06 ENCOUNTER — Telehealth: Payer: Self-pay | Admitting: *Deleted

## 2012-10-06 DIAGNOSIS — Z1231 Encounter for screening mammogram for malignant neoplasm of breast: Secondary | ICD-10-CM

## 2012-10-06 NOTE — Telephone Encounter (Signed)
Called patient. The lab in question was before starting pravachol.  She reports sharp HA pain relieved with APAP  Plan- continue pravachol. Return for lab Dec 1

## 2012-10-06 NOTE — Telephone Encounter (Signed)
Pt left msg on triage stating she received lab letter from md. Requesting md to change her cholesterol med. Pls advise...Heather Grant

## 2012-10-09 ENCOUNTER — Other Ambulatory Visit: Payer: Self-pay | Admitting: Internal Medicine

## 2012-10-13 ENCOUNTER — Inpatient Hospital Stay: Admit: 2012-10-13 | Payer: Self-pay | Admitting: Orthopedic Surgery

## 2012-10-13 SURGERY — ARTHROPLASTY, KNEE, TOTAL
Anesthesia: General | Site: Knee | Laterality: Left

## 2012-11-15 ENCOUNTER — Other Ambulatory Visit (INDEPENDENT_AMBULATORY_CARE_PROVIDER_SITE_OTHER): Payer: Medicare Other

## 2012-11-15 DIAGNOSIS — M109 Gout, unspecified: Secondary | ICD-10-CM

## 2012-11-15 DIAGNOSIS — I10 Essential (primary) hypertension: Secondary | ICD-10-CM

## 2012-11-15 DIAGNOSIS — G4733 Obstructive sleep apnea (adult) (pediatric): Secondary | ICD-10-CM

## 2012-11-15 DIAGNOSIS — E785 Hyperlipidemia, unspecified: Secondary | ICD-10-CM

## 2012-11-15 LAB — COMPREHENSIVE METABOLIC PANEL
ALT: 22 U/L (ref 0–35)
AST: 20 U/L (ref 0–37)
Albumin: 3.9 g/dL (ref 3.5–5.2)
Alkaline Phosphatase: 70 U/L (ref 39–117)
BUN: 9 mg/dL (ref 6–23)
CO2: 26 mEq/L (ref 19–32)
Calcium: 9.6 mg/dL (ref 8.4–10.5)
Chloride: 104 mEq/L (ref 96–112)
Creatinine, Ser: 1.2 mg/dL (ref 0.4–1.2)
GFR: 58.28 mL/min — ABNORMAL LOW (ref >60.00)
Glucose, Bld: 102 mg/dL — ABNORMAL HIGH (ref 70–99)
Potassium: 3.9 mEq/L (ref 3.5–5.1)
Sodium: 140 mEq/L (ref 135–145)
Total Bilirubin: 0.6 mg/dL (ref 0.3–1.2)
Total Protein: 7.3 g/dL (ref 6.0–8.3)

## 2012-11-15 LAB — CBC WITH DIFFERENTIAL/PLATELET
Basophils Absolute: 0.1 10*3/uL (ref 0.0–0.1)
Basophils Relative: 0.8 % (ref 0.0–3.0)
Eosinophils Absolute: 0.1 10*3/uL (ref 0.0–0.7)
Eosinophils Relative: 1.2 % (ref 0.0–5.0)
HCT: 37.4 % (ref 36.0–46.0)
Hemoglobin: 12.6 g/dL (ref 12.0–15.0)
Lymphocytes Relative: 27.6 % (ref 12.0–46.0)
Lymphs Abs: 1.9 10*3/uL (ref 0.7–4.0)
MCHC: 33.7 g/dL (ref 30.0–36.0)
MCV: 91.6 fl (ref 78.0–100.0)
Monocytes Absolute: 0.9 10*3/uL (ref 0.1–1.0)
Monocytes Relative: 13.9 % — ABNORMAL HIGH (ref 3.0–12.0)
Neutro Abs: 3.9 10*3/uL (ref 1.4–7.7)
Neutrophils Relative %: 56.5 % (ref 43.0–77.0)
Platelets: 316 10*3/uL (ref 150.0–400.0)
RBC: 4.09 Mil/uL (ref 3.87–5.11)
RDW: 12.8 % (ref 11.5–14.6)
WBC: 6.8 10*3/uL (ref 4.5–10.5)

## 2012-11-15 LAB — TSH: TSH: 0.3 u[IU]/mL — ABNORMAL LOW (ref 0.35–5.50)

## 2012-11-15 LAB — LIPID PANEL
Cholesterol: 200 mg/dL (ref 0–200)
HDL: 40.2 mg/dL (ref >39.00)
LDL Cholesterol: 140 mg/dL — ABNORMAL HIGH (ref 0–99)
Total CHOL/HDL Ratio: 5
Triglycerides: 99 mg/dL (ref 0.0–149.0)
VLDL: 19.8 mg/dL (ref 0.0–40.0)

## 2012-11-15 LAB — URIC ACID: Uric Acid, Serum: 8.1 mg/dL — ABNORMAL HIGH (ref 2.4–7.0)

## 2012-11-16 ENCOUNTER — Encounter: Payer: Self-pay | Admitting: Internal Medicine

## 2012-11-16 ENCOUNTER — Telehealth: Payer: Self-pay | Admitting: Internal Medicine

## 2012-11-16 MED ORDER — EZETIMIBE 10 MG PO TABS: 10.0000 mg | ORAL_TABLET | Freq: Every day | ORAL | Status: DC

## 2012-11-16 NOTE — Telephone Encounter (Signed)
Patient Information:  Caller Name: Brenly  Phone: [phone]  Patient: Heather Grant, Heather Grant  Gender: Female  DOB: 1941/10/03  Age: 72 Years  PCP: Illene Regulus (Adults only)  Office Follow Up:  Does the office need to follow up with this patient?: No  Instructions For The Office: N/A  RN Note:  Patient has dry cough with onset 11/14/12 with some nasal discharge.  Describes cough as mainly during the day and has taken medication that has helped.  She is even a little better.  Denies fever or emergent symptoms.  Offered to schedule appointment for tomorrow based on triaged and patient indicating she wanted and antibiotic, but stated she was willing to try home care first and call back as needed.  Symptoms  Reason For Call & Symptoms: Cough is dry  Reviewed Health History In EMR: Yes  Reviewed Medications In EMR: Yes  Reviewed Allergies In EMR: Yes  Reviewed Surgeries / Procedures: Yes  Date of Onset of Symptoms: 11/14/2012  Treatments Tried: Alka Seltzer plus  Treatments Tried Worked: Yes  Guideline(s) Used:  Cough  Disposition Per Guideline:   Home Care  Reason For Disposition Reached:   Cough with cold symptoms (e.g., runny nose, postnasal drip, throat clearing)  Advice Given:  Reassurance  You can get a dry hacking cough after a chest cold. Sometimes this type of cough can last 1-3 weeks, and be worse at night.  You can also get a cough after being exposed to irritating substances like smoke, strong perfumes, and dust.  Here is some care advice that should help.  Cough Medicines:  OTC Cough Drops: Cough drops can help a lot, especially for mild coughs. They reduce coughing by soothing your irritated throat and removing that tickle sensation in the back of the throat. Cough drops also have the advantage of portability - you can carry them with you.  Home Remedy - Honey: This old home remedy has been shown to help decrease coughing at night. The adult dosage is 2  teaspoons (10 ml) at bedtime. Honey should not be given to infants under one year of age.  Coughing Spasms:  Drink warm fluids. Inhale warm mist (Reason: both relax the airway and loosen up the phlegm).  Suck on cough drops or hard candy to coat the irritated throat.  Prevent Dehydration:  Drink adequate liquids.  This will help soothe an irritated or dry throat and loosen up the phlegm.  Call Back If:  Difficulty breathing  Cough lasts more than 3 weeks  Fever lasts > 3 days  You become worse.  For a Runny Nose With Profuse Discharge:   Nasal mucus and discharge help to wash viruses and bacteria out of the nose and sinuses.  Blowing the nose is all that is needed.  If the skin around your nostrils gets irritated, apply a tiny amount of petroleum ointment to the nasal openings once or twice a day.  For a Stuffy Nose - Use Nasal Washes:  Introduction: Saline (salt water) nasal irrigation (nasal wash) is an effective and simple home remedy for treating stuffy nose and sinus congestion. The nose can be irrigated by pouring, spraying, or squirting salt water into the nose and then letting it run back out.  How it Helps: The salt water rinses out excess mucus, washes out any irritants (dust, allergens) that might be present, and moistens the nasal cavity.  Methods: There are several ways to perform nasal irrigation. You can use a saline nasal spray  bottle (available over-the-counter), a rubber ear syringe, a medical syringe without the needle, or a Neti Pot.

## 2012-11-18 ENCOUNTER — Telehealth: Payer: Self-pay | Admitting: Internal Medicine

## 2012-11-18 NOTE — Telephone Encounter (Signed)
Patient Information:  Caller Name: Kerith  Phone: [phone]  Patient: Heather Grant  Gender: Female  DOB: 11/26/1941  Age: 71 Years  PCP: Illene Regulus (Adults only)  Office Follow Up:  Does the office need to follow up with this patient?: No  Instructions For The Office: N/A  RN Note:  called yesterday for cold sxs and she said she feels like they have improved but now she is wheezing some.  No fever and no chest pain   Able to speak full sentences.  I heard  very distinct wheezes over the phone while speaking to her.  Symptoms  Reason For Call & Symptoms: wheezing  Reviewed Health History In EMR: Yes  Reviewed Medications In EMR: Yes  Reviewed Allergies In EMR: Yes  Reviewed Surgeries / Procedures: Yes  Date of Onset of Symptoms: 11/18/2012  Guideline(s) Used:  Colds  Breathing Difficulty  Disposition Per Guideline:   Go to ED Now  Reason For Disposition Reached:   Wheezing can be heard across the room  Advice Given:  N/A

## 2012-11-19 ENCOUNTER — Emergency Department (INDEPENDENT_AMBULATORY_CARE_PROVIDER_SITE_OTHER)
Admission: EM | Admit: 2012-11-19 | Discharge: 2012-11-19 | Disposition: A | Discharge status: Other Acute Inpatient Hospital | Payer: Medicare Other | Source: Home / Self Care | Attending: Family Medicine | Admitting: Family Medicine

## 2012-11-19 ENCOUNTER — Encounter (HOSPITAL_COMMUNITY): Payer: Self-pay

## 2012-11-19 ENCOUNTER — Emergency Department (HOSPITAL_COMMUNITY)
Admission: EM | Admit: 2012-11-19 | Discharge: 2012-11-19 | Disposition: A | Discharge status: Home / Self Care | Payer: Medicare Other | Attending: Emergency Medicine | Admitting: Emergency Medicine

## 2012-11-19 ENCOUNTER — Emergency Department (INDEPENDENT_AMBULATORY_CARE_PROVIDER_SITE_OTHER): Payer: Medicare Other

## 2012-11-19 ENCOUNTER — Encounter (HOSPITAL_COMMUNITY): Payer: Self-pay | Admitting: Physical Medicine and Rehabilitation

## 2012-11-19 DIAGNOSIS — E785 Hyperlipidemia, unspecified: Secondary | ICD-10-CM | POA: Insufficient documentation

## 2012-11-19 DIAGNOSIS — R0602 Shortness of breath: Secondary | ICD-10-CM

## 2012-11-19 DIAGNOSIS — IMO0001 Reserved for inherently not codable concepts without codable children: Secondary | ICD-10-CM

## 2012-11-19 DIAGNOSIS — R05 Cough: Secondary | ICD-10-CM | POA: Insufficient documentation

## 2012-11-19 DIAGNOSIS — E669 Obesity, unspecified: Secondary | ICD-10-CM | POA: Insufficient documentation

## 2012-11-19 DIAGNOSIS — Z87891 Personal history of nicotine dependence: Secondary | ICD-10-CM | POA: Insufficient documentation

## 2012-11-19 DIAGNOSIS — J45909 Unspecified asthma, uncomplicated: Secondary | ICD-10-CM

## 2012-11-19 DIAGNOSIS — Z8739 Personal history of other diseases of the musculoskeletal system and connective tissue: Secondary | ICD-10-CM | POA: Insufficient documentation

## 2012-11-19 DIAGNOSIS — I10 Essential (primary) hypertension: Secondary | ICD-10-CM | POA: Insufficient documentation

## 2012-11-19 DIAGNOSIS — Z79899 Other long term (current) drug therapy: Secondary | ICD-10-CM | POA: Insufficient documentation

## 2012-11-19 DIAGNOSIS — Z8679 Personal history of other diseases of the circulatory system: Secondary | ICD-10-CM | POA: Insufficient documentation

## 2012-11-19 DIAGNOSIS — Z8619 Personal history of other infectious and parasitic diseases: Secondary | ICD-10-CM | POA: Insufficient documentation

## 2012-11-19 DIAGNOSIS — J45901 Unspecified asthma with (acute) exacerbation: Secondary | ICD-10-CM | POA: Insufficient documentation

## 2012-11-19 DIAGNOSIS — R059 Cough, unspecified: Secondary | ICD-10-CM | POA: Insufficient documentation

## 2012-11-19 LAB — CBC WITH DIFFERENTIAL/PLATELET
Basophils Absolute: 0.1 10*3/uL (ref 0.0–0.1)
Basophils Relative: 1 % (ref 0–1)
Eosinophils Absolute: 0.2 10*3/uL (ref 0.0–0.7)
Eosinophils Relative: 2 % (ref 0–5)
HCT: 37.7 % (ref 36.0–46.0)
Hemoglobin: 12.8 g/dL (ref 12.0–15.0)
Lymphocytes Relative: 58 % — ABNORMAL HIGH (ref 12–46)
Lymphs Abs: 4.2 10*3/uL — ABNORMAL HIGH (ref 0.7–4.0)
MCH: 31.1 pg (ref 26.0–34.0)
MCHC: 34 g/dL (ref 30.0–36.0)
MCV: 91.5 fL (ref 78.0–100.0)
Monocytes Absolute: 0.7 10*3/uL (ref 0.1–1.0)
Monocytes Relative: 9 % (ref 3–12)
Neutro Abs: 2.1 10*3/uL (ref 1.7–7.7)
Neutrophils Relative %: 29 % — ABNORMAL LOW (ref 43–77)
Platelets: 310 10*3/uL (ref 150–400)
RBC: 4.12 MIL/uL (ref 3.87–5.11)
RDW: 12.2 % (ref 11.5–15.5)
WBC: 7.1 10*3/uL (ref 4.0–10.5)

## 2012-11-19 LAB — PRO B NATRIURETIC PEPTIDE: Pro B Natriuretic peptide (BNP): 70.4 pg/mL (ref 0–125)

## 2012-11-19 LAB — BASIC METABOLIC PANEL
BUN: 13 mg/dL (ref 6–23)
CO2: 25 mEq/L (ref 19–32)
Calcium: 10 mg/dL (ref 8.4–10.5)
Chloride: 103 mEq/L (ref 96–112)
Creatinine, Ser: 1.14 mg/dL — ABNORMAL HIGH (ref 0.50–1.10)
GFR calc Af Amer: 56 mL/min — ABNORMAL LOW (ref >90)
GFR calc non Af Amer: 48 mL/min — ABNORMAL LOW (ref >90)
Glucose, Bld: 125 mg/dL — ABNORMAL HIGH (ref 70–99)
Potassium: 3.8 mEq/L (ref 3.5–5.1)
Sodium: 141 mEq/L (ref 135–145)

## 2012-11-19 LAB — POCT I-STAT TROPONIN I: Troponin i, poc: 0.01 ng/mL (ref 0.00–0.08)

## 2012-11-19 MED ORDER — ALBUTEROL SULFATE HFA 108 (90 BASE) MCG/ACT IN AERS
2.0000 | INHALATION_SPRAY | RESPIRATORY_TRACT | Status: DC | PRN
  Filled 2012-11-19: qty 6.7

## 2012-11-19 MED ORDER — ALBUTEROL SULFATE (5 MG/ML) 0.5% IN NEBU
INHALATION_SOLUTION | RESPIRATORY_TRACT | Status: AC
  Filled 2012-11-19: qty 1

## 2012-11-19 MED ORDER — IPRATROPIUM BROMIDE 0.02 % IN SOLN
0.5000 mg | Freq: Once | RESPIRATORY_TRACT | Status: AC
  Administered 2012-11-19: 0.5 mg via RESPIRATORY_TRACT
  Filled 2012-11-19: qty 2.5

## 2012-11-19 MED ORDER — ALBUTEROL SULFATE (5 MG/ML) 0.5% IN NEBU
5.0000 mg | INHALATION_SOLUTION | Freq: Once | RESPIRATORY_TRACT | Status: AC
  Administered 2012-11-19: 5 mg via RESPIRATORY_TRACT

## 2012-11-19 MED ORDER — ALBUTEROL SULFATE (5 MG/ML) 0.5% IN NEBU
5.0000 mg | INHALATION_SOLUTION | Freq: Once | RESPIRATORY_TRACT | Status: AC
  Administered 2012-11-19: 5 mg via RESPIRATORY_TRACT
  Filled 2012-11-19: qty 1

## 2012-11-19 MED ORDER — GUAIFENESIN-CODEINE 100-10 MG/5ML PO SYRP: ORAL_SOLUTION | ORAL | Status: DC

## 2012-11-19 MED ORDER — IPRATROPIUM BROMIDE 0.02 % IN SOLN
0.5000 mg | Freq: Once | RESPIRATORY_TRACT | Status: AC
  Administered 2012-11-19: 0.5 mg via RESPIRATORY_TRACT

## 2012-11-19 MED ORDER — AZITHROMYCIN 250 MG PO TABS: ORAL_TABLET | ORAL | Status: DC

## 2012-11-19 NOTE — ED Notes (Signed)
Discontinued Chest xray. Patient had a chest xray completed today at [facility].

## 2012-11-19 NOTE — ED Provider Notes (Signed)
History     CSN: [id number]  Arrival date & time 11/19/12  1410   First MD Initiated Contact with Patient 11/19/12 1427      Chief Complaint  Patient presents with  . Shortness of Breath    (Consider location/radiation/quality/duration/timing/severity/associated sxs/prior treatment) Patient is a 71 y.o. female presenting with shortness of breath. The history is provided by the patient and a relative.  Shortness of Breath  The current episode started today. The onset was sudden. The problem has been gradually worsening. The problem is moderate. Associated symptoms include rhinorrhea, cough, shortness of breath and wheezing. Pertinent negatives include no chest pain and no chest pressure.    Past Medical History  Diagnosis Date  . Hypertension   . Migraines   . Arthritis     hands and knees. May be hips  . Varicella   . Obesity, Class II, BMI 35-39.9   . OSA (obstructive sleep apnea)     mild by study Dec '09. AHI 5.7  . Nocturnal dyspnea     ONO 2010 with several readings  below 88%  . Gout     great toe, ankles  . Plantar fasciitis   . Hyperlipidemia     Past Surgical History  Procedure Date  . Carpal tunnel release     right  . Dilation and curettage of uterus   . G5p4     NSVD  . Cataract     right eye with IOL    Family History  Problem Relation Age of Onset  . Hypertension Mother   . Stroke Father   . Cancer Sister   . Cancer Sister     lung    History  Substance Use Topics  . Smoking status: Former Games developer  . Smokeless tobacco: Never Used  . Alcohol Use: Yes     Comment: rare    OB History    Grav Para Term Preterm Abortions TAB SAB Ect Mult Living                  Review of Systems  Constitutional: Negative.   HENT: Positive for congestion and rhinorrhea.   Respiratory: Positive for cough, shortness of breath and wheezing.   Cardiovascular: Negative for chest pain, palpitations and leg swelling.  Gastrointestinal: Negative.      Allergies  Review of patient's allergies indicates no known allergies.  Home Medications   Current Outpatient Rx  Name  Route  Sig  Dispense  Refill  . ALLOPURINOL 300 MG PO TABS      TAKE 1 TABLET BY MOUTH EVERY DAY   30 tablet   5   . DOMEBORO 25 % EX PACK   Apply externally   Apply 1 packet topically daily.   100 each   11   . COLCRYS 0.6 MG PO TABS      TAKE 1 TABLET (0.6 MG TOTAL) BY MOUTH DAILY.   30 tablet   5   . DIOVAN 320 MG PO TABS      TAKE 1 TABLET (320 MG TOTAL) BY MOUTH DAILY.   30 tablet   11   . EZETIMIBE 10 MG PO TABS   Oral   Take 1 tablet (10 mg total) by mouth daily.   30 tablet   3   . FLUCONAZOLE 150 MG PO TABS   Oral   Take 1 tablet (150 mg total) by mouth daily.   3 tablet   1   . HYDROCHLOROTHIAZIDE 25 MG  PO TABS   Oral   Take 1 tablet (25 mg total) by mouth daily.   30 tablet   11   . METOPROLOL SUCCINATE ER 25 MG PO TB24   Oral   Take 1 tablet (25 mg total) by mouth daily.   30 tablet   11   . NABUMETONE 750 MG PO TABS   Oral   Take 1 tablet (750 mg total) by mouth 2 (two) times daily.   60 tablet   11   . NYSTATIN 100000 UNIT/GM EX POWD   Topical   Apply topically 4 (four) times daily.   60 g   11   . NYSTATIN-TRIAMCINOLONE 100000-0.1 UNIT/GM-% EX OINT   Topical   Apply topically 2 (two) times daily.   60 g   11   . PRAVASTATIN SODIUM 40 MG PO TABS   Oral   Take 1 tablet (40 mg total) by mouth daily.   30 tablet   5     BP 138/61  Pulse 98  Temp 98.6 F (37 C) (Oral)  Resp 18  SpO2 99%  Physical Exam  Nursing note and vitals reviewed. Constitutional: She is oriented to person, place, and time. She appears well-developed and well-nourished.  HENT:  Head: Normocephalic.  Right Ear: External ear normal.  Left Ear: External ear normal.  Mouth/Throat: Oropharynx is clear and moist.  Eyes: Pupils are equal, round, and reactive to light.  Neck: Normal range of motion. Neck supple.   Pulmonary/Chest: No respiratory distress. She has wheezes.  Lymphadenopathy:    She has no cervical adenopathy.  Neurological: She is alert and oriented to person, place, and time.  Skin: Skin is warm and dry.    ED Course  Procedures (including critical care time)  Labs Reviewed - No data to display Dg Chest 2 View  11/19/2012  *RADIOLOGY REPORT*  Clinical Data: Cough and short of breath  CHEST - 2 VIEW  Comparison: 01/28/2012  Findings: Heart size and vascularity are normal.  Lungs are clear without infiltrate or effusion.  No mass lesion.  No significant interval change from the prior exam.  IMPRESSION: No active cardiopulmonary disease.   Original Report Authenticated By: Janeece Riggers, M.D.      1. Shortness of breath dyspnea       MDM  X-rays reviewed and report per radiologist.  Wheezing persistent after neb .          Linna Hoff, MD 11/19/12 [phone]

## 2012-11-19 NOTE — ED Provider Notes (Signed)
History     CSN: [id number]  Arrival date & time 11/19/12  1623   First MD Initiated Contact with Patient 11/19/12 1855      Chief Complaint  Patient presents with  . Shortness of Breath    (Consider location/radiation/quality/duration/timing/severity/associated sxs/prior treatment) Patient is a 71 y.o. female presenting with cough. The history is provided by the patient. No language interpreter was used.  Cough This is a new problem. The current episode started more than 2 days ago (She had onset of cough, starting last Sunday, worsening yesterday with wheezing.  ). Episode frequency: She called Dr. Debby Bud' office and was advised to go to the ED.  [facility] and sent to the ED for additional evaluation.  The problem has been gradually worsening. The cough is productive of sputum. There has been no fever. Associated symptoms include shortness of breath and wheezing. Pertinent negatives include no chills. Treatments tried: Given an albuterol and atrovent nebulizer treatment at the [facility]. The treatment provided no relief. She is not a smoker.    Past Medical History  Diagnosis Date  . Hypertension   . Migraines   . Arthritis     hands and knees. May be hips  . Varicella   . Obesity, Class II, BMI 35-39.9   . OSA (obstructive sleep apnea)     mild by study Dec '09. AHI 5.7  . Nocturnal dyspnea     ONO 2010 with several readings  below 88%  . Gout     great toe, ankles  . Plantar fasciitis   . Hyperlipidemia     Past Surgical History  Procedure Date  . Carpal tunnel release     right  . Dilation and curettage of uterus   . G5p4     NSVD  . Cataract     right eye with IOL    Family History  Problem Relation Age of Onset  . Hypertension Mother   . Stroke Father   . Cancer Sister   . Cancer Sister     lung    History  Substance Use Topics  . Smoking status: Former Games developer  . Smokeless tobacco: Never Used  . Alcohol Use: Yes   Comment: rare    OB History    Grav Para Term Preterm Abortions TAB SAB Ect Mult Living                  Review of Systems  Constitutional: Negative.  Negative for fever and chills.  HENT: Negative.   Eyes: Negative.   Respiratory: Positive for cough, shortness of breath and wheezing.   Cardiovascular: Negative.   Gastrointestinal: Negative.   Genitourinary: Negative.   Musculoskeletal: Negative.   Skin: Negative.   Neurological: Negative.   Psychiatric/Behavioral: Negative.     Allergies  Review of patient's allergies indicates no known allergies.  Home Medications   Current Outpatient Rx  Name  Route  Sig  Dispense  Refill  . ALCAFTADINE 0.25 % OP SOLN   Both Eyes   Place 1 drop into both eyes every other day.         . ALLOPURINOL 300 MG PO TABS   Oral   Take 300 mg by mouth daily.         Marland Kitchen CARBOXYMETHYLCELLULOSE SODIUM 0.5 % OP SOLN   Both Eyes   Place 1 drop into both eyes every other day.         . COLCHICINE  0.6 MG PO TABS   Oral   Take 0.6 mg by mouth daily.         Marland Kitchen METOPROLOL SUCCINATE ER 25 MG PO TB24   Oral   Take 1 tablet (25 mg total) by mouth daily.   30 tablet   11   . NABUMETONE 750 MG PO TABS   Oral   Take 750 mg by mouth 2 (two) times daily as needed. For gout pain         . NYSTATIN 100000 UNIT/GM EX POWD   Topical   Apply 1 g topically 2 (two) times daily as needed. For itching of crotch area         . NYSTATIN-TRIAMCINOLONE 100000-0.1 UNIT/GM-% EX OINT   Topical   Apply 1 application topically 2 (two) times daily as needed. For itching in crotch area         . VALSARTAN 320 MG PO TABS   Oral   Take 320 mg by mouth daily.         . AZITHROMYCIN 250 MG PO TABS      Take 2 tablets today, then 1 tablet per day for the next 4 days.   6 tablet   0   . GUAIFENESIN-CODEINE 100-10 MG/5ML PO SYRP      Take 2 teaspoons every 4 hours if needed for cough.   120 mL   0   . HYDROCODONE-ACETAMINOPHEN 10-650 MG PO  TABS   Oral   Take 1 tablet by mouth daily as needed. For pain           BP 167/54  Pulse 85  Temp 98.3 F (36.8 C) (Oral)  Resp 18  SpO2 99%  Physical Exam  Constitutional: She is oriented to person, place, and time. She appears well-developed and well-nourished. No distress.  HENT:  Head: Normocephalic and atraumatic.  Right Ear: External ear normal.  Left Ear: External ear normal.  Mouth/Throat: Oropharynx is clear and moist.  Eyes: Conjunctivae normal and EOM are normal. Pupils are equal, round, and reactive to light.  Neck: Normal range of motion. Neck supple.  Cardiovascular: Normal rate, regular rhythm and normal heart sounds.   Pulmonary/Chest:       Wheezes heard at left base.  Abdominal: Bowel sounds are normal.  Musculoskeletal: Normal range of motion. She exhibits no edema and no tenderness.  Neurological: She is alert and oriented to person, place, and time.       No sensory or motor deficit.  Skin: Skin is warm and dry.  Psychiatric: She has a normal mood and affect. Her behavior is normal.    ED Course  Procedures (including critical care time)   Results for orders placed during the hospital encounter of 11/19/12  PRO B NATRIURETIC PEPTIDE      Component Value Range   Pro B Natriuretic peptide (BNP) 70.4  0 - 125 pg/mL  BASIC METABOLIC PANEL      Component Value Range   Sodium 141  135 - 145 mEq/L   Potassium 3.8  3.5 - 5.1 mEq/L   Chloride 103  96 - 112 mEq/L   CO2 25  19 - 32 mEq/L   Glucose, Bld 125 (*) 70 - 99 mg/dL   BUN 13  6 - 23 mg/dL   Creatinine, Ser 9.60 (*) 0.50 - 1.10 mg/dL   Calcium 45.4  8.4 - 09.8 mg/dL   GFR calc non Af Amer 48 (*) >90 mL/min   GFR calc  Af Amer 56 (*) >90 mL/min  CBC WITH DIFFERENTIAL      Component Value Range   WBC 7.1  4.0 - 10.5 K/uL   RBC 4.12  3.87 - 5.11 MIL/uL   Hemoglobin 12.8  12.0 - 15.0 g/dL   HCT 16.1  09.6 - 04.5 %   MCV 91.5  78.0 - 100.0 fL   MCH 31.1  26.0 - 34.0 pg   MCHC 34.0  30.0 -  36.0 g/dL   RDW 40.9  81.1 - 91.4 %   Platelets 310  150 - 400 K/uL   Neutrophils Relative 29 (*) 43 - 77 %   Neutro Abs 2.1  1.7 - 7.7 K/uL   Lymphocytes Relative 58 (*) 12 - 46 %   Lymphs Abs 4.2 (*) 0.7 - 4.0 K/uL   Monocytes Relative 9  3 - 12 %   Monocytes Absolute 0.7  0.1 - 1.0 K/uL   Eosinophils Relative 2  0 - 5 %   Eosinophils Absolute 0.2  0.0 - 0.7 K/uL   Basophils Relative 1  0 - 1 %   Basophils Absolute 0.1  0.0 - 0.1 K/uL  POCT I-STAT TROPONIN I      Component Value Range   Troponin i, poc 0.01  0.00 - 0.08 ng/mL   Comment 3            Dg Chest 2 View  11/19/2012  *RADIOLOGY REPORT*  Clinical Data: Cough and short of breath  CHEST - 2 VIEW  Comparison: 01/28/2012  Findings: Heart size and vascularity are normal.  Lungs are clear without infiltrate or effusion.  No mass lesion.  No significant interval change from the prior exam.  IMPRESSION: No active cardiopulmonary disease.   Original Report Authenticated By: Janeece Riggers, M.D.     Lab workup was negative.  Rx for asthmatic bronchitis with Z-pak, Robitussin AC, and albuterol inhaler.   1. Asthmatic bronchitis            Carleene Cooper III, MD 11/19/12 1924

## 2012-11-19 NOTE — ED Notes (Addendum)
Pt presents to department from [facility] for evaluation of SOB and new onset asthma. Pt states she began wheezing and began progressively more short of breath throughout day. Upon arrival lung sounds diminished upper lobes, no wheezing noted. Denies chest pain. Respirations unlabored. Speaking complete sentences. Pt is conscious alert and oriented x4. Chest x-ray done at [facility] is negative.

## 2012-11-19 NOTE — ED Notes (Signed)
C/o URI type symptoms, want to checked out ? Breathing tx; coarse sounds noted on ascultation bilateral chest ; NAD

## 2012-11-28 ENCOUNTER — Telehealth: Payer: Self-pay | Admitting: Internal Medicine

## 2012-11-28 NOTE — Telephone Encounter (Signed)
Patient Information:  Caller Name: Korynne  Phone: [phone]  Patient: Heather Grant  Gender: Female  DOB: 25-Aug-1941  Age: 71 Years  PCP: Illene Regulus (Adults only)  Office Follow Up:  Does the office need to follow up with this patient?: No  Instructions For The Office: N/A  RN Note:  States is improving but still having some wheezing and productive cough.  Afebrile.  Per protocol, advised appt within 24 hours.  Appt scheduled 11/29/12 1545 with Ms. Sampson Si.  krs/can  Symptoms  Reason For Call & Symptoms: wheezing; seen in ED 11/19/12 and is better, but states she still gets congested, worse on arising AM.  States using inhaler x 2 each morning.  Diagnosed with asthmatic bronchitis.  Needs follow up appt.  Reviewed Health History In EMR: Yes  Reviewed Medications In EMR: Yes  Reviewed Allergies In EMR: Yes  Reviewed Surgeries / Procedures: Yes  Date of Onset of Symptoms: Unknown  Guideline(s) Used:  Asthma Attack  Disposition Per Guideline:   See Today or Tomorrow in Office  Reason For Disposition Reached:   Mild asthma attack (e.g., no SOB at rest, mild SOB with walking, speaks normally in sentences, mild wheezing) and persists > 24 hours on appropriate treatment  Advice Given:  N/A  Appointment Scheduled:  11/29/2012 15:45:00 Appointment Scheduled Provider:  Nicki Reaper

## 2012-11-29 ENCOUNTER — Encounter: Payer: Self-pay | Admitting: Internal Medicine

## 2012-11-29 ENCOUNTER — Ambulatory Visit (INDEPENDENT_AMBULATORY_CARE_PROVIDER_SITE_OTHER): Payer: Medicare Other | Admitting: Internal Medicine

## 2012-11-29 VITALS — BP 140/78 | HR 72 | Temp 98.1°F

## 2012-11-29 DIAGNOSIS — J209 Acute bronchitis, unspecified: Secondary | ICD-10-CM

## 2012-11-29 MED ORDER — HYDROCODONE-HOMATROPINE 5-1.5 MG/5ML PO SYRP: 5.0000 mL | ORAL_SOLUTION | Freq: Three times a day (TID) | ORAL | Status: DC | PRN

## 2012-11-29 MED ORDER — LEVOFLOXACIN 500 MG PO TABS: 500.0000 mg | ORAL_TABLET | Freq: Every day | ORAL | Status: DC
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
## 2012-11-29 NOTE — Patient Instructions (Addendum)

## 2012-12-08 ENCOUNTER — Encounter: Payer: Self-pay | Admitting: Internal Medicine

## 2012-12-08 ENCOUNTER — Ambulatory Visit (INDEPENDENT_AMBULATORY_CARE_PROVIDER_SITE_OTHER): Payer: Medicare Other | Admitting: Internal Medicine

## 2012-12-08 VITALS — BP 126/80 | HR 85 | Temp 97.9°F | Resp 12 | Wt 263.1 lb

## 2012-12-08 DIAGNOSIS — J209 Acute bronchitis, unspecified: Secondary | ICD-10-CM

## 2012-12-08 DIAGNOSIS — E785 Hyperlipidemia, unspecified: Secondary | ICD-10-CM

## 2012-12-08 MED ORDER — PRAVASTATIN SODIUM 40 MG PO TABS: 40.0000 mg | ORAL_TABLET | Freq: Every day | ORAL | Status: DC

## 2012-12-08 NOTE — Progress Notes (Signed)
Subjective:    Patient ID: Heather Grant, female    DOB: March 11, 1941, 72 y.o.   MRN: [id number]  HPI Ms. Lekas was sick and seen at urgent care Dec 21st for wheezing which did not clear with a nebulizer. She was referred to ED - all records reviewed - she was wheezing. CXR negative, CBC normal Chemistry panel normal. She was released with a MDI, Z-pak. Her symptoms did not clear - she was seen Dec 31st by Ms. Baity who treated with Levquin x 7 days and continued the MDI.  Lipid management - at October visit she was started on Prevastatin. She had follow up lab December with an LDL that went from 192 to 478. She was advised by letter to add Zetia for further lowering but she took this to mean stopping pravastatin and take zetia alone. She stopped Pravastatin. Zetia gave her stomach pain so she stopped the Zetia.   PMH, FamHx and SocHx reviewed for any changes and relevance. Current Outpatient Prescriptions on File Prior to Visit  Medication Sig Dispense Refill  . albuterol (PROVENTIL) (2.5 MG/3ML) 0.083% nebulizer solution Take 2.5 mg by nebulization every 6 (six) hours as needed.      . Alcaftadine (LASTACAFT) 0.25 % SOLN Place 1 drop into both eyes every other day.      . allopurinol (ZYLOPRIM) 300 MG tablet Take 300 mg by mouth daily.      . carboxymethylcellulose (REFRESH PLUS) 0.5 % SOLN Place 1 drop into both eyes every other day.      . colchicine 0.6 MG tablet Take 0.6 mg by mouth daily.      Marland Kitchen guaiFENesin-codeine (ROBITUSSIN AC) 100-10 MG/5ML syrup Take 2 teaspoons every 4 hours if needed for cough.  120 mL  0  . HYDROcodone-homatropine (HYCODAN) 5-1.5 MG/5ML syrup Take 5 mLs by mouth every 8 (eight) hours as needed for cough.  120 mL  0  . metoprolol succinate (TOPROL-XL) 25 MG 24 hr tablet Take 1 tablet (25 mg total) by mouth daily.  30 tablet  11  . nabumetone (RELAFEN) 750 MG tablet Take 750 mg by mouth 2 (two) times daily as needed. For gout pain      . nystatin  (MYCOSTATIN/NYSTOP) 100000 UNIT/GM POWD Apply 1 g topically 2 (two) times daily as needed. For itching of crotch area      . nystatin-triamcinolone ointment (MYCOLOG) Apply 1 application topically 2 (two) times daily as needed. For itching in crotch area      . valsartan (DIOVAN) 320 MG tablet Take 320 mg by mouth daily.      Marland Kitchen levofloxacin (LEVAQUIN) 500 MG tablet Take 1 tablet (500 mg total) by mouth daily.  7 tablet  0      Review of Systems System review is negative for any constitutional, cardiac, pulmonary, GI or neuro symptoms or complaints other than as described in the HPI.     Objective:   Physical Exam Filed Vitals:   12/08/12 1513  BP: 126/80  Pulse: 85  Temp: 97.9 F (36.6 C)  Resp: 12   Wt Readings from Last 3 Encounters:  12/08/12 263 lb 1.3 oz (119.332 kg)  09/21/12 266 lb (120.657 kg)  09/12/12 226 lb (102.513 kg)   Gen'l- overweight AAwoman in no distress HEENT - C&S clear Cor - RRR PUlm - no increased WOB, expiratory wheezing diffusely on exam.       Assessment & Plan:  Asthmatic bronchitis - no active bacterial infectious signs but still  with restricted airway.  Plan Provided patient education including cartoon of inflammatory changes to the airway  Finish the levaquin  Albuterol MDI 2 puffs q 4 but not more than 4 times a day. If using 3 times a day or more she will need to be reevaluated.

## 2012-12-08 NOTE — Patient Instructions (Addendum)
You had acute bronchitis with swelling of the airways due to infection with inflammation (see cartoon). You seem to be better with very clear lungs today. The cough in the morning sounds pretty minor.  Plan - take 1-2 tsp of robitussin DM (or the generic equivalent) at bedtime. This contains mucinex which will help you clear overnight accumulation of mucus.            No indication for a chronic inhaler.   Cholesterol management - I am sorry that I was unclear in my letter of Dec 18th: you were to ADD Zetia to the pravastatin for better control of the LDL. The Pravastatin did lower the LDL cholesterol from 192 to 140, which is a good drop.   Plan A very strict low fat diet! This will help lower the cholesterol and this will thus help you loose weight  Retry the Pravastatin 40 mg once a day.  If you have problems with pravastatin call and we can either go with diet alone or try a another class of medication.     Fat and Cholesterol Control Diet Cholesterol levels in your body are determined significantly by your diet. Cholesterol levels may also be related to heart disease. The following material helps to explain this relationship and discusses what you can do to help keep your heart healthy. Not all cholesterol is bad. Low-density lipoprotein (LDL) cholesterol is the "bad" cholesterol. It may cause fatty deposits to build up inside your arteries. High-density lipoprotein (HDL) cholesterol is "good." It helps to remove the "bad" LDL cholesterol from your blood. Cholesterol is a very important risk factor for heart disease. Other risk factors are high blood pressure, smoking, stress, heredity, and weight. The heart muscle gets its supply of blood through the coronary arteries. If your LDL cholesterol is high and your HDL cholesterol is low, you are at risk for having fatty deposits build up in your coronary arteries. This leaves less room through which blood can flow. Without sufficient blood and  oxygen, the heart muscle cannot function properly and you may feel chest pains (angina pectoris). When a coronary artery closes up entirely, a part of the heart muscle may die causing a heart attack (myocardial infarction). CHECKING CHOLESTEROL When your caregiver sends your blood to a lab to be examined for cholesterol, a complete lipid (fat) profile may be done. With this test, the total amount of cholesterol and levels of LDL and HDL are determined. Triglycerides are a type of fat that circulates in the blood. They can also be used to determine heart disease risk. The list below describes what the numbers should be: Test: Total Cholesterol.  Less than 200 mg/dl.  Test: LDL "bad cholesterol."  Less than 100 mg/dl.   Less than 70 mg/dl if you are at very high risk of a heart attack or sudden cardiac death.  Test: HDL "good cholesterol."  Greater than 50 mg/dl for women.   Greater than 40 mg/dl for men.  Test: Triglycerides.  Less than 150 mg/dl.  CONTROLLING CHOLESTEROL WITH DIET Although exercise and lifestyle factors are important, your diet is key. That is because certain foods are known to raise cholesterol and others to lower it. The goal is to balance foods for their effect on cholesterol and more importantly, to replace saturated and trans fat with other types of fat, such as monounsaturated fat, polyunsaturated fat, and omega-3 fatty acids. On average, a person should consume no more than 15 to 17 g of saturated fat  daily. Saturated and trans fats are considered "bad" fats, and they will raise LDL cholesterol. Saturated fats are primarily found in animal products such as meats, butter, and cream. However, that does not mean you need to give up all your favorite foods. Today, there are good tasting, low-fat, low-cholesterol substitutes for most of the things you like to eat. Choose low-fat or nonfat alternatives. Choose round or loin cuts of red meat. These types of cuts are lowest in  fat and cholesterol. Chicken (without the skin), fish, veal, and ground [place] breast are great choices. Eliminate fatty meats, such as hot dogs and salami. Even shellfish have little or no saturated fat. Have a 3 oz (85 g) portion when you eat lean meat, poultry, or fish. Trans fats are also called "partially hydrogenated oils." They are oils that have been scientifically manipulated so that they are solid at room temperature resulting in a longer shelf life and improved taste and texture of foods in which they are added. Trans fats are found in stick margarine, some tub margarines, cookies, crackers, and baked goods.   When baking and cooking, oils are a great substitute for butter. The monounsaturated oils are especially beneficial since it is believed they lower LDL and raise HDL. The oils you should avoid entirely are saturated tropical oils, such as coconut and palm.   Remember to eat a lot from food groups that are naturally free of saturated and trans fat, including fish, fruit, vegetables, beans, grains (barley, rice, couscous, bulgur wheat), and pasta (without cream sauces).   IDENTIFYING FOODS THAT LOWER CHOLESTEROL   Soluble fiber may lower your cholesterol. This type of fiber is found in fruits such as apples, vegetables such as broccoli, potatoes, and carrots, legumes such as beans, peas, and lentils, and grains such as barley. Foods fortified with plant sterols (phytosterol) may also lower cholesterol. You should eat at least 2 g per day of these foods for a cholesterol lowering effect.   Read package labels to identify low-saturated fats, trans fat free, and low-fat foods at the supermarket. Select cheeses that have only 2 to 3 g saturated fat per ounce. Use a heart-healthy tub margarine that is free of trans fats or partially hydrogenated oil. When buying baked goods (cookies, crackers), avoid partially hydrogenated oils. Breads and muffins should be made from whole grains (whole-wheat or  whole oat flour, instead of "flour" or "enriched flour"). Buy non-creamy canned soups with reduced salt and no added fats.   FOOD PREPARATION TECHNIQUES   Never deep-fry. If you must fry, either stir-fry, which uses very little fat, or use non-stick cooking sprays. When possible, broil, bake, or roast meats, and steam vegetables. Instead of putting butter or margarine on vegetables, use lemon and herbs, applesauce, and cinnamon (for squash and sweet potatoes), nonfat yogurt, salsa, and low-fat dressings for salads.   LOW-SATURATED FAT / LOW-FAT FOOD SUBSTITUTES Meats / Saturated Fat (g)  Avoid: Steak, marbled (3 oz/85 g) / 11 g   Choose: Steak, lean (3 oz/85 g) / 4 g   Avoid: Hamburger (3 oz/85 g) / 7 g   Choose: Hamburger, lean (3 oz/85 g) / 5 g   Avoid: Ham (3 oz/85 g) / 6 g   Choose: Ham, lean cut (3 oz/85 g) / 2.4 g   Avoid: Chicken, with skin, dark meat (3 oz/85 g) / 4 g   Choose: Chicken, skin removed, dark meat (3 oz/85 g) / 2 g   Avoid: Chicken, with skin, light  meat (3 oz/85 g) / 2.5 g   Choose: Chicken, skin removed, light meat (3 oz/85 g) / 1 g  Dairy / Saturated Fat (g)  Avoid: Whole milk (1 cup) / 5 g   Choose: Low-fat milk, 2% (1 cup) / 3 g   Choose: Low-fat milk, 1% (1 cup) / 1.5 g   Choose: Skim milk (1 cup) / 0.3 g   Avoid: Hard cheese (1 oz/28 g) / 6 g   Choose: Skim milk cheese (1 oz/28 g) / 2 to 3 g   Avoid: Cottage cheese, 4% fat (1 cup) / 6.5 g   Choose: Low-fat cottage cheese, 1% fat (1 cup) / 1.5 g   Avoid: Ice cream (1 cup) / 9 g   Choose: Sherbet (1 cup) / 2.5 g   Choose: Nonfat frozen yogurt (1 cup) / 0.3 g   Choose: Frozen fruit bar / trace   Avoid: Whipped cream (1 tbs) / 3.5 g   Choose: Nondairy whipped topping (1 tbs) / 1 g  Condiments / Saturated Fat (g)  Avoid: Mayonnaise (1 tbs) / 2 g   Choose: Low-fat mayonnaise (1 tbs) / 1 g   Avoid: Butter (1 tbs) / 7 g   Choose: Extra light margarine (1 tbs) / 1 g   Avoid: Coconut  oil (1 tbs) / 11.8 g   Choose: Olive oil (1 tbs) / 1.8 g   Choose: Corn oil (1 tbs) / 1.7 g   Choose: Safflower oil (1 tbs) / 1.2 g   Choose: Sunflower oil (1 tbs) / 1.4 g   Choose: Soybean oil (1 tbs) / 2.4 g   Choose: Canola oil (1 tbs) / 1 g  Document Released: 11/16/2005 Document Revised: 02/08/2012 Document Reviewed: 05/07/2011 [facility] Patient Information 2013 [place], [place].

## 2012-12-11 NOTE — Assessment & Plan Note (Signed)
Explained miscommunication: she was to continue pravastatin and add zetia!  Plan Leave of zetia  Resume pravastatin  Diet management - patient education provided.

## 2013-03-20 ENCOUNTER — Ambulatory Visit (INDEPENDENT_AMBULATORY_CARE_PROVIDER_SITE_OTHER): Payer: Medicare Other | Admitting: Internal Medicine

## 2013-03-20 ENCOUNTER — Telehealth: Payer: Self-pay | Admitting: Internal Medicine

## 2013-03-20 ENCOUNTER — Encounter: Payer: Self-pay | Admitting: Internal Medicine

## 2013-03-20 VITALS — BP 150/84 | HR 81 | Temp 98.3°F

## 2013-03-20 DIAGNOSIS — J45901 Unspecified asthma with (acute) exacerbation: Secondary | ICD-10-CM

## 2013-03-20 DIAGNOSIS — J309 Allergic rhinitis, unspecified: Secondary | ICD-10-CM

## 2013-03-20 MED ORDER — LORATADINE 10 MG PO TABS: 10.0000 mg | ORAL_TABLET | Freq: Every day | ORAL | Status: DC

## 2013-03-20 MED ORDER — PREDNISONE (PAK) 10 MG PO TABS: 10.0000 mg | ORAL_TABLET | ORAL | Status: DC

## 2013-03-20 NOTE — Progress Notes (Signed)
Subjective:    Patient ID: Heather Grant, female    DOB: Aug 07, 1941, 72 y.o.   MRN: [id number]  Cough This is a recurrent problem. The current episode started in the past 7 days. The problem has been gradually improving. The problem occurs every few minutes. The cough is non-productive. Associated symptoms include nasal congestion, postnasal drip, rhinorrhea, shortness of breath and wheezing. Pertinent negatives include no chest pain, chills, fever, heartburn, sore throat or weight loss. The symptoms are aggravated by pollens and lying down. She has tried OTC cough suppressant for the symptoms. The treatment provided mild relief. Her past medical history is significant for environmental allergies. There is no history of COPD. Asthma: ?     Past Medical History  Diagnosis Date  . Hypertension   . Migraines   . Arthritis     hands and knees. May be hips  . Varicella   . Obesity, Class II, BMI 35-39.9   . OSA (obstructive sleep apnea)     mild by study Dec '09. AHI 5.7  . Nocturnal dyspnea     ONO 2010 with several readings  below 88%  . Gout     great toe, ankles  . Plantar fasciitis   . Hyperlipidemia     Review of Systems  Constitutional: Negative for fever, chills and weight loss.  HENT: Positive for rhinorrhea and postnasal drip. Negative for sore throat.   Respiratory: Positive for cough, shortness of breath and wheezing.   Cardiovascular: Negative for chest pain.  Gastrointestinal: Negative for heartburn.  Allergic/Immunologic: Positive for environmental allergies.       Objective:   Physical Exam BP 150/84  Pulse 81  Temp(Src) 98.3 F (36.8 C) (Oral)  SpO2 94% Wt Readings from Last 3 Encounters:  12/08/12 263 lb 1.3 oz (119.332 kg)  09/21/12 266 lb (120.657 kg)  09/12/12 226 lb (102.513 kg)   Constitutional: She is obese, but appears well-developed and well-nourished. No distress.  HENT: Head: Normocephalic and atraumatic. Sinus nontender. Ears: B TMs ok,  no erythema or effusion; Nose: Nose normal. Mouth/Throat: Oropharynx is clear and moist. No oropharyngeal exudate.  Eyes: Conjunctivae and EOM are normal. Pupils are equal, round, and reactive to light. No scleral icterus.  Neck: Normal range of motion. Neck supple. No JVD or LAD present. No thyromegaly present.  Cardiovascular: Normal rate, regular rhythm and normal heart sounds.  No murmur heard. No BLE edema. Pulmonary/Chest: Effort normal and breath sounds normal. No respiratory distress. She has soft and expiratory wheezes notably at the left base.  Skin: Skin is warm and dry. No rash noted. No erythema.  Psychiatric: She has a normal mood and affect. Her behavior is normal. Judgment and thought content normal.   Lab Results  Component Value Date   WBC 7.1 11/19/2012   HGB 12.8 11/19/2012   HCT 37.7 11/19/2012   PLT 310 11/19/2012   GLUCOSE 125* 11/19/2012   CHOL 200 11/15/2012   TRIG 99.0 11/15/2012   HDL 40.20 11/15/2012   LDLDIRECT 192.2 09/21/2012   LDLCALC 140* 11/15/2012   ALT 22 11/15/2012   AST 20 11/15/2012   NA 141 11/19/2012   K 3.8 11/19/2012   CL 103 11/19/2012   CREATININE 1.14* 11/19/2012   BUN 13 11/19/2012   CO2 25 11/19/2012   TSH 0.30* 11/15/2012   Dg Chest 2 View  11/19/2012  *RADIOLOGY REPORT*  Clinical Data: Cough and short of breath  CHEST - 2 VIEW  Comparison: 01/28/2012  Findings: Heart  size and vascularity are normal.  Lungs are clear without infiltrate or effusion.  No mass lesion.  No significant interval change from the prior exam.  IMPRESSION: No active cardiopulmonary disease.   Original Report Authenticated By: Janeece Riggers, M.D.         Assessment & Plan:   Asthmatic bronchitis with bronchospasm Allergic rhinitis  Treat with prednisone taper x6 days Hold empiric antibiotics with symptoms worse Daily antihistamine Claritin for next 30 days, then as needed Continue over-the-counter cough medications and other prescriptions as previously  advised

## 2013-03-20 NOTE — Telephone Encounter (Signed)
Agree with advice as given. Thanks

## 2013-03-20 NOTE — Patient Instructions (Signed)
It was good to see you today. We have reviewed your prior records including labs and tests today If you develop worsening symptoms or fever, call and we can reconsider antibiotics, but it does not appear necessary to use antibiotics at this time. Use prednisone taper over the next 6 days and Claritin once daily for the next 30 days - Your prescription(s) have been submitted to your pharmacy. Please take as directed and contact our office if you believe you are having problem(s) with the medication(s). Call if symptoms unimproved in next 5-7 days, sooner if worse

## 2013-03-20 NOTE — Telephone Encounter (Signed)
[facility] Triage Call Report Triage Record Num: [id number] Operator: Tomasita Crumble Patient Name: [facility] Call Date & Time: 03/18/2013 1:35:52PM Patient Phone: [phone] PCP: Illene Regulus Patient Gender: Female PCP Fax : [phone] Patient DOB: [birth year] Practice Name: [facility] Reason for Call: Caller: Janet Berlin; PCP: Illene Regulus (Adults only); CB#: [phone]; Call regarding Cough/Congestion; Wheezing. History of bronchitis January 2014. Clear spuutm. Afebrile/subjective. Onset cough 03/18/13. Also reports hoarseness. Reviewed EMR. Advised see provider within 24 hours per nursing judgment and Cough - Adult protocol. Since office closed until 4/21 advised [facility]. Caller states plan to do home care measures and go for evaluation if condition worsens. Protocol(s) Used: Cough - Adult Recommended Outcome per Protocol: Provide Home/Self Care Reason for Outcome: Hoarseness (laryngitis) Care Advice: ~ Use a cool mist humidifier to moisten air. Be sure to clean according to manufacturer's instructions. Suck on hard candy: Life Savers, lemon drops, etc. (sugar free if diabetic) or herbal throat lozenges may provide relief. ~ May inhale steam from hot shower or heated water. Be careful to avoid burns. Limit or avoid exposure to irritants and allergens (e.g. air pollution, smoke/smoking, chemicals, dust, pollen, pet dander, etc.) ~ Anytime hoarseness is present, avoid talking and singing.

## 2013-03-27 ENCOUNTER — Ambulatory Visit (INDEPENDENT_AMBULATORY_CARE_PROVIDER_SITE_OTHER): Payer: Medicare Other | Admitting: Internal Medicine

## 2013-03-27 ENCOUNTER — Other Ambulatory Visit (INDEPENDENT_AMBULATORY_CARE_PROVIDER_SITE_OTHER): Payer: Medicare Other

## 2013-03-27 ENCOUNTER — Telehealth: Payer: Self-pay | Admitting: Internal Medicine

## 2013-03-27 ENCOUNTER — Ambulatory Visit (INDEPENDENT_AMBULATORY_CARE_PROVIDER_SITE_OTHER)
Admission: RE | Admit: 2013-03-27 | Discharge: 2013-03-27 | Disposition: A | Discharge status: Home / Self Care | Payer: Medicare Other | Source: Ambulatory Visit | Attending: Internal Medicine | Admitting: Internal Medicine

## 2013-03-27 ENCOUNTER — Encounter: Payer: Self-pay | Admitting: Internal Medicine

## 2013-03-27 VITALS — BP 158/90 | HR 104 | Temp 99.1°F | Resp 16 | Ht 59.0 in | Wt 266.8 lb

## 2013-03-27 DIAGNOSIS — E785 Hyperlipidemia, unspecified: Secondary | ICD-10-CM

## 2013-03-27 DIAGNOSIS — I10 Essential (primary) hypertension: Secondary | ICD-10-CM

## 2013-03-27 DIAGNOSIS — J209 Acute bronchitis, unspecified: Secondary | ICD-10-CM

## 2013-03-27 DIAGNOSIS — R7309 Other abnormal glucose: Secondary | ICD-10-CM | POA: Insufficient documentation

## 2013-03-27 DIAGNOSIS — R059 Cough, unspecified: Secondary | ICD-10-CM

## 2013-03-27 DIAGNOSIS — J45901 Unspecified asthma with (acute) exacerbation: Secondary | ICD-10-CM

## 2013-03-27 DIAGNOSIS — R05 Cough: Secondary | ICD-10-CM

## 2013-03-27 DIAGNOSIS — J4520 Mild intermittent asthma, uncomplicated: Secondary | ICD-10-CM | POA: Insufficient documentation

## 2013-03-27 LAB — COMPREHENSIVE METABOLIC PANEL
ALT: 35 U/L (ref 0–35)
AST: 22 U/L (ref 0–37)
Albumin: 3.9 g/dL (ref 3.5–5.2)
Alkaline Phosphatase: 71 U/L (ref 39–117)
BUN: 23 mg/dL (ref 6–23)
CO2: 30 mEq/L (ref 19–32)
Calcium: 9.4 mg/dL (ref 8.4–10.5)
Chloride: 104 mEq/L (ref 96–112)
Creatinine, Ser: 1.4 mg/dL — ABNORMAL HIGH (ref 0.4–1.2)
GFR: 47.02 mL/min — ABNORMAL LOW (ref >60.00)
Glucose, Bld: 126 mg/dL — ABNORMAL HIGH (ref 70–99)
Potassium: 3.8 mEq/L (ref 3.5–5.1)
Sodium: 142 mEq/L (ref 135–145)
Total Bilirubin: 0.6 mg/dL (ref 0.3–1.2)
Total Protein: 7 g/dL (ref 6.0–8.3)

## 2013-03-27 LAB — LIPID PANEL
Cholesterol: 193 mg/dL (ref 0–200)
HDL: 43.3 mg/dL (ref >39.00)
Total CHOL/HDL Ratio: 4
Triglycerides: 422 mg/dL — ABNORMAL HIGH (ref 0.0–149.0)
VLDL: 84.4 mg/dL — ABNORMAL HIGH (ref 0.0–40.0)

## 2013-03-27 LAB — TSH: TSH: 1.24 u[IU]/mL (ref 0.35–5.50)

## 2013-03-27 LAB — LDL CHOLESTEROL, DIRECT: Direct LDL: 120.5 mg/dL

## 2013-03-27 LAB — HEMOGLOBIN A1C: Hgb A1c MFr Bld: 5.6 % (ref 4.6–6.5)

## 2013-03-27 MED ORDER — IPRATROPIUM BROMIDE 0.02 % IN SOLN
0.5000 mg | Freq: Once | RESPIRATORY_TRACT | Status: AC
  Administered 2013-03-27: 0.5 mg via RESPIRATORY_TRACT

## 2013-03-27 MED ORDER — MOXIFLOXACIN HCL 400 MG PO TABS: 400.0000 mg | ORAL_TABLET | Freq: Every day | ORAL | Status: DC

## 2013-03-27 MED ORDER — MOMETASONE FURO-FORMOTEROL FUM 200-5 MCG/ACT IN AERO: 2.0000 | INHALATION_SPRAY | Freq: Two times a day (BID) | RESPIRATORY_TRACT | Status: DC

## 2013-03-27 MED ORDER — ALBUTEROL SULFATE HFA 108 (90 BASE) MCG/ACT IN AERS: 2.0000 | INHALATION_SPRAY | Freq: Four times a day (QID) | RESPIRATORY_TRACT | Status: DC | PRN

## 2013-03-27 NOTE — Assessment & Plan Note (Signed)
Start avelox for the infection 

## 2013-03-27 NOTE — Assessment & Plan Note (Signed)
I will check her A1C to see if she has developed DM2 

## 2013-03-27 NOTE — Patient Instructions (Signed)

## 2013-03-27 NOTE — Assessment & Plan Note (Signed)
She has completed the oral steroids I have asked her to start using dulera and ventolin - I gave her samples and O instructed her on the use of them, she used them and demonstrated proficiency in their use

## 2013-03-27 NOTE — Assessment & Plan Note (Signed)
I will check her CXR to see if she has PNA 

## 2013-03-27 NOTE — Progress Notes (Signed)
Subjective:    Patient ID: [name], female    DOB: [birth year], 72 y.o.   MRN: [id number]  Cough This is a recurrent problem. The current episode started 1 to 4 weeks ago. The problem has been gradually worsening. The problem occurs every few hours. The cough is productive of purulent sputum. Associated symptoms include chills, a sore throat, shortness of breath, sweats and wheezing. Pertinent negatives include no chest pain, ear congestion, ear pain, fever, headaches, heartburn, hemoptysis, myalgias, nasal congestion, postnasal drip, rash, rhinorrhea or weight loss. Nothing aggravates the symptoms. She has tried oral steroids for the symptoms. The treatment provided mild relief. Her past medical history is significant for asthma. There is no history of bronchiectasis, bronchitis, COPD, emphysema, environmental allergies or pneumonia.      Review of Systems  Constitutional: Positive for chills. Negative for fever, weight loss, diaphoresis, activity change, appetite change, fatigue and unexpected weight change.  HENT: Positive for sore throat. Negative for ear pain, rhinorrhea and postnasal drip.   Eyes: Negative.   Respiratory: Positive for cough, shortness of breath and wheezing. Negative for apnea, hemoptysis, choking, chest tightness and stridor.   Cardiovascular: Negative.  Negative for chest pain, palpitations and leg swelling.  Gastrointestinal: Negative.  Negative for heartburn, nausea, vomiting, abdominal pain, diarrhea and constipation.  Endocrine: Negative.   Genitourinary: Negative.   Musculoskeletal: Negative.  Negative for myalgias, back pain, joint swelling and gait problem.  Skin: Negative.  Negative for rash.  Allergic/Immunologic: Negative.  Negative for environmental allergies.  Neurological: Negative.  Negative for dizziness, tremors, speech difficulty, weakness, light-headedness, numbness and headaches.  Hematological: Negative.  Negative for adenopathy. Does  not bruise/bleed easily.  Psychiatric/Behavioral: Negative.        Objective:   Physical Exam  Vitals reviewed. Constitutional: She is oriented to person, place, and time. She appears well-developed and well-nourished.  Non-toxic appearance. She does not have a sickly appearance. She appears ill. No distress.  HENT:  Head: Normocephalic and atraumatic.  Mouth/Throat: Oropharynx is clear and moist. No oropharyngeal exudate.  Eyes: Conjunctivae are normal. Right eye exhibits no discharge. Left eye exhibits no discharge. No scleral icterus.  Neck: Normal range of motion. Neck supple. No JVD present. No tracheal deviation present. No thyromegaly present.  Cardiovascular: Normal rate, regular rhythm, normal heart sounds and intact distal pulses.  Exam reveals no gallop and no friction rub.   No murmur heard. Pulmonary/Chest: No accessory muscle usage or stridor. Not tachypneic. She is in respiratory distress. She has no decreased breath sounds. She has wheezes in the right middle field and the left middle field. She has rhonchi in the right middle field and the left middle field. She has no rales. She exhibits no tenderness.  She as given a jet neb with 2.5 mg albuterol, after that her lung exam shows no rhonchi and very few wheezes, she is moving air well and is in no distress after the albuterol jet neb  Abdominal: Soft. Bowel sounds are normal. She exhibits no distension and no mass. There is no tenderness. There is no rebound and no guarding.  Musculoskeletal: Normal range of motion. She exhibits no edema and no tenderness.  Lymphadenopathy:    She has no cervical adenopathy.  Neurological: She is oriented to person, place, and time.  Skin: Skin is warm and dry. No rash noted. She is not diaphoretic. No erythema. No pallor.  Psychiatric: She has a normal mood and affect. Her behavior is normal. Judgment and thought content  normal.     Lab Results  Component Value Date   WBC 7.1  11/19/2012   HGB 12.8 11/19/2012   HCT 37.7 11/19/2012   PLT 310 11/19/2012   GLUCOSE 125* 11/19/2012   CHOL 200 11/15/2012   TRIG 99.0 11/15/2012   HDL 40.20 11/15/2012   LDLDIRECT 192.2 09/21/2012   LDLCALC 140* 11/15/2012   ALT 22 11/15/2012   AST 20 11/15/2012   NA 141 11/19/2012   K 3.8 11/19/2012   CL 103 11/19/2012   CREATININE 1.14* 11/19/2012   BUN 13 11/19/2012   CO2 25 11/19/2012   TSH 0.30* 11/15/2012       Assessment & Plan:

## 2013-03-27 NOTE — Telephone Encounter (Signed)
Patient Information:  Caller Name: Laresha  Phone: [phone]  Patient: Heather Grant, Heather Grant  Gender: Female  DOB: January 04, 1941  Age: 72 Years  PCP: Illene Regulus (Adults only)  Office Follow Up:  Does the office need to follow up with this patient?: No  Instructions For The Office: N/A   Symptoms  Reason For Call & Symptoms: Pt is calling and states that she was senn in the office 03/20/13 and dx with bronchitis; sx started  03/17/13; sx are a lttle better but still present; pt reports that she is tired and wheezing; coughing up yellow mucus now; cough is the worst sx  Reviewed Health History In EMR: Yes  Reviewed Medications In EMR: Yes  Reviewed Allergies In EMR: Yes  Reviewed Surgeries / Procedures: Yes  Date of Onset of Symptoms: 03/17/2013  Treatments Tried: Claritin and Prednison completed  Treatments Tried Worked: No  Guideline(s) Used:  Cough  Disposition Per Guideline:   Go to Office Now  Reason For Disposition Reached:   Wheezing is present  Advice Given:  Coughing Spasms:  Drink warm fluids. Inhale warm mist (Reason: both relax the airway and loosen up the phlegm).  Prevent Dehydration:  Drink adequate liquids.  Call Back If:  Difficulty breathing  You become worse.  Patient Will Follow Care Advice:  YES  Appointment Scheduled:  03/27/2013 15:45:00 Appointment Scheduled Provider:  Sanda Linger (Adults only)

## 2013-03-28 ENCOUNTER — Ambulatory Visit: Payer: Self-pay | Admitting: Internal Medicine

## 2013-05-01 ENCOUNTER — Encounter: Payer: Self-pay | Admitting: Internal Medicine

## 2013-05-01 ENCOUNTER — Ambulatory Visit (INDEPENDENT_AMBULATORY_CARE_PROVIDER_SITE_OTHER): Payer: Medicare Other | Admitting: Internal Medicine

## 2013-05-01 VITALS — BP 170/86 | HR 75 | Temp 97.0°F | Wt 263.4 lb

## 2013-05-01 DIAGNOSIS — J45909 Unspecified asthma, uncomplicated: Secondary | ICD-10-CM

## 2013-05-01 DIAGNOSIS — J4520 Mild intermittent asthma, uncomplicated: Secondary | ICD-10-CM

## 2013-05-01 NOTE — Progress Notes (Signed)
Subjective:    Patient ID: Heather Grant, female    DOB: 06-02-1941, 72 y.o.   MRN: [id number]  HPI Heather Grant presents for follow up of "asthma." Chart reviewed: she had a bout of asthmatic bronchitis in Dec '13- was seen at Urgent care, referred to ED and Dr. Ignacia Palma diagnosed asthmatic broncitis. She did not really clear until early January and was seen in the interval by Ms. Baity and myself. She had a recurrent episode of asthmatic bronchitis in April diagnosed by Dr. Felicity Coyer and treated with steroids but not antibiotics. She saw Dr. Yetta Barre April 28th in follow up. She was doing better but at that time he diagnosed asthma and started [facility].  Since the 28th she has done well except for rhinitis with occasonal cough.  PMH, FamHx and SocHx reviewed for any changes and relevance. Current Outpatient Prescriptions on File Prior to Visit  Medication Sig Dispense Refill  . albuterol (VENTOLIN HFA) 108 (90 BASE) MCG/ACT inhaler Inhale 2 puffs into the lungs every 6 (six) hours as needed for wheezing.  1 Inhaler  11  . Alcaftadine (LASTACAFT) 0.25 % SOLN Place 1 drop into both eyes every other day.      . allopurinol (ZYLOPRIM) 300 MG tablet Take 300 mg by mouth daily.      . carboxymethylcellulose (REFRESH PLUS) 0.5 % SOLN Place 1 drop into both eyes every other day.      . colchicine 0.6 MG tablet Take 0.6 mg by mouth daily.      Marland Kitchen loratadine (CLARITIN) 10 MG tablet Take 1 tablet (10 mg total) by mouth daily.  30 tablet  2  . mometasone-formoterol (DULERA) 200-5 MCG/ACT AERO Inhale 2 puffs into the lungs 2 (two) times daily.  1 Inhaler  11  . moxifloxacin (AVELOX) 400 MG tablet Take 1 tablet (400 mg total) by mouth daily.  5 tablet  0  . nabumetone (RELAFEN) 750 MG tablet Take 750 mg by mouth 2 (two) times daily as needed. For gout pain      . nystatin (MYCOSTATIN/NYSTOP) 100000 UNIT/GM POWD Apply 1 g topically 2 (two) times daily as needed. For itching of crotch area      .  nystatin-triamcinolone ointment (MYCOLOG) Apply 1 application topically 2 (two) times daily as needed. For itching in crotch area      . pravastatin (PRAVACHOL) 40 MG tablet Take 1 tablet (40 mg total) by mouth daily. Take 1 by mouth daily  30 tablet    . valsartan (DIOVAN) 320 MG tablet Take 320 mg by mouth daily.      . metoprolol succinate (TOPROL-XL) 25 MG 24 hr tablet Take 1 tablet (25 mg total) by mouth daily.  30 tablet  11   No current facility-administered medications on file prior to visit.      Review of Systems System review is negative for any constitutional, cardiac, pulmonary, GI or neuro symptoms or complaints other than as described in the HPI.     Objective:   Physical Exam Filed Vitals:   05/01/13 1313  BP: 170/86  Pulse: 75  Temp: 97 F (36.1 C)   Wt Readings from Last 3 Encounters:  05/01/13 263 lb 6.4 oz (119.477 kg)  03/27/13 266 lb 12 oz (120.997 kg)  12/08/12 263 lb 1.3 oz (119.332 kg)   Gen'l- obese AA woman in no distress HEENT -= C&S clear Cor - 2+ radial pulse, RRR Pulm - normal breath sounds w/o wheezing, rales. NO cough. Neuro- alert and  awake       Assessment & Plan:  Asthmatic bronchitis - patient with two prolonged episodes of asthmatic bronchitis but no history of asthma as a chronic disease. Lungs are clear on exam.  Plan Referral to pulmonary medicine to assist in making a definitive diagnosis before committing to long term treatment with inhalational steroids.

## 2013-05-01 NOTE — Patient Instructions (Addendum)
Asthmatic bronchitis - patient with two prolonged episodes of asthmatic bronchitis but no history of asthma as a chronic disease. Lungs are clear on exam.  Plan Referral to pulmonary medicine to assist in making a definitive diagnosis before committing to long term treatment with inhalational steroids.  Chronic Asthmatic Bronchitis Chronic asthmatic bronchitis is often a complication of frequent asthma and/or bronchitis. After a long enough period of time, the continual airflow blockage is present in spite of treatment for asthma. The medications that used to treat asthma no longer work. The symptoms of chronic bronchitis may also be present. Bronchitis is an inflammation of the breathing tubules in the lungs. The combination of asthma, chronic bronchitis, and emphysema all affect the small breathing tubules (bronchial tree) in our lungs. It is a common condition. The problems from each are similar and overlap with each other so are sometimes hard to diagnose. When the asthma and bronchitis are combined, there is usually inflammation and infection. The small bronchial tubes produce more mucus. This blocks the airways and makes breathing harder. Usually this process is caused more by external irritants than infection. Smokers with chronic bronchitis are at a greater risk to develop asthmatic bronchitis. CAUSES   Why some people with asthma go on to develop chronic asthmatic bronchitis is not known. Smoking and environmental toxins or allergens seem to play a role. There are wide differences in who is susceptible.  Abnormalities of the small airways may develop in persons with persistent asthma. Asthmatics can be uncommonly subject to the effects of smoking. Asthma is also found associated with a number of other diseases. SYMPTOMS  Asthma, chronic bronchitis, and emphysema all cause symptoms of cough, wheezing, shortness of breath, and recurring infections. There may also be chest discomfort. All of the  above symptoms happen more often in chronic asthmatic bronchitis. DIAGNOSIS   Asthma, chronic bronchitis, and emphysema all affect the entire bronchial tree. This makes it difficult on exam to tell them apart. Other tests of the lungs are done to prove a diagnosis. These are called pulmonary function tests. TREATMENT   The asthmatic condition itself must always be treated.  Infection can be treated with antibiotics (medications to kill germs).  Serious infections may require hospitalization. These can include pneumonia, sinus infections, and acute bronchitis. HOME CARE INSTRUCTIONS  Use prescription medications as ordered by your caregiver.  Avoid pollen, dust, animal dander, molds, smoke, and other things that cause attacks at home and at work.  You may have fewer attacks if you decrease dust in your home. Electrostatic air cleaners may help.  It may help to replace your pillows or mattress with materials less likely to cause allergies.  If you are not on fluid restriction, drink 8 to 10 glasses of water each day.  Discuss possible exercise routines with your caregiver.  If animal dander is the cause of asthma, you may need to get rid of pets.  It is important that you:  Become educated about your medical condition.  Participate in maintaining wellness.  Seek medical care promptly or immediately as indicated below.  Delay in seeking medical attention could cause permanent injury and may be a risk to your life. SEEK MEDICAL CARE IF  You have wheezing and shortness of breath even if taking medicine to prevent attacks.  An oral temperature above 102 F (38.9 C)  You have muscle aches, chest pain, or thickening of sputum.  Your sputum changes from clear or white to yellow, green, gray, or bloody.  You have any problems that may be related to the medicine you are taking (such as a rash, itching, swelling, or trouble breathing). SEEK IMMEDIATE MEDICAL CARE IF:  Your usual  medicines do not stop your wheezing.  There is increased coughing and/or shortness of breath.  You have increased difficulty breathing. MAKE SURE YOU:   Understand these instructions.  Will watch your condition.  Will get help right away if you are not doing well or get worse. Document Released: 09/03/2006 Document Revised: 02/08/2012 Document Reviewed: 11/01/2007 [facility] Patient Information 2014 [place], [place].

## 2013-05-02 ENCOUNTER — Institutional Professional Consult (permissible substitution): Payer: Medicare Other | Admitting: Pulmonary Disease

## 2013-05-22 ENCOUNTER — Telehealth: Payer: Self-pay | Admitting: Internal Medicine

## 2013-05-22 NOTE — Telephone Encounter (Signed)
Off this week - see if can wait for Tammy P otherwise will need to work in to someone else's schedule

## 2013-05-22 NOTE — Telephone Encounter (Signed)
Spoke with pt states having sob,wheezing,productive cough ,thick and yellowish in color. Sinus drainage Pt has apt with DR wert on JUly 2nd as new consult . Dr wert can you see her today as a acute ?    No Known Allergies Dr Sherene Sires please advise

## 2013-05-22 NOTE — Telephone Encounter (Signed)
Per CY - pt needs to see her PCP or go to an urgent care. We have not seen this pt in office so we can't start calling medications in for her.  Pt is aware of this information.

## 2013-05-22 NOTE — Telephone Encounter (Signed)
TP off until Wednesday Called spoke with patient, apologized for the inconvenience and delay [place] with openings this afternoon at 3 and 3:15pm - pt with no transportation in that short amount of time No openings tomorrow Dr Maple Hudson please advise, thank you.

## 2013-05-23 ENCOUNTER — Institutional Professional Consult (permissible substitution): Payer: Medicare Other | Admitting: Pulmonary Disease

## 2013-05-24 ENCOUNTER — Telehealth: Payer: Self-pay | Admitting: *Deleted

## 2013-05-24 NOTE — Telephone Encounter (Signed)
May use otc robitussin, mucinex. For wheezing or possible bacterial infection she will need to be seen. Can be added on to my schedule.

## 2013-05-24 NOTE — Telephone Encounter (Signed)
Spoke with pt advised her as per Dr Debby Bud

## 2013-05-24 NOTE — Telephone Encounter (Signed)
Pt called states she is having productive cough, nasal congestion, and wheezing; further states she has an appointment on 7.2.2014 with Pulmonary.  She is requesting an Rx to last until she is seen by Pulmonary.

## 2013-05-31 ENCOUNTER — Ambulatory Visit (INDEPENDENT_AMBULATORY_CARE_PROVIDER_SITE_OTHER): Payer: Medicare Other | Admitting: Internal Medicine

## 2013-05-31 ENCOUNTER — Encounter: Payer: Self-pay | Admitting: Internal Medicine

## 2013-05-31 VITALS — BP 170/92 | HR 67 | Temp 98.0°F | Ht 59.0 in | Wt 263.0 lb

## 2013-05-31 DIAGNOSIS — J45901 Unspecified asthma with (acute) exacerbation: Secondary | ICD-10-CM

## 2013-05-31 MED ORDER — PANTOPRAZOLE SODIUM 40 MG PO TBEC: 40.0000 mg | DELAYED_RELEASE_TABLET | Freq: Every day | ORAL | Status: DC

## 2013-05-31 MED ORDER — FAMOTIDINE 20 MG PO TABS: ORAL_TABLET | ORAL | Status: DC

## 2013-05-31 NOTE — Progress Notes (Signed)
  Subjective:    Patient ID: Heather Grant, female    DOB: [birth year]    MRN: [id number]  HPI  72 yobf never smoker with ? New onset asthma in Dec 2013 referred 05/31/2013 to pulmonary clinic by Dr Debby Bud - did have one bad sinus infection age 72 but no resp problems subsequently or need for any type of resp/ allergy med in interim.  05/31/2013 1st pulmonary eval cc acute onset cough Dec 2013   treated in ER but   but ever since then cc daily symptoms of coughing day > night assoc with sob andworse when move around and productive of white mucus and feels like something stuck in throat. Some better from inhalers despite very poor hfa, no better with last prednisone course.  No obvious daytime variabilty or assoc  cp or chest tightness, subjective wheeze overt sinus or hb symptoms. No unusual exp hx or h/o childhood pna/ asthma or knowledge of premature birth.    Also denies any obvious fluctuation of symptoms with weather or environmental changes or other aggravating or alleviating factors except as outlined above   Review of Systems  Constitutional: Negative for fever, chills and unexpected weight change.  HENT: Positive for sore throat. Negative for ear pain, nosebleeds, congestion, rhinorrhea, sneezing, trouble swallowing, dental problem, voice change, postnasal drip and sinus pressure.   Eyes: Negative for visual disturbance.  Respiratory: Positive for cough and shortness of breath. Negative for choking.   Cardiovascular: Negative for chest pain and leg swelling.  Gastrointestinal: Negative for vomiting, abdominal pain and diarrhea.  Genitourinary: Negative for difficulty urinating.  Musculoskeletal: Positive for arthralgias.  Skin: Negative for rash.  Neurological: Positive for headaches. Negative for tremors and syncope.  Hematological: Does not bruise/bleed easily.       Objective:   Physical Exam   Obese bf needs one person assist to get on table / prominent  pseudowheeze  Wt Readings from Last 3 Encounters:  05/31/13 263 lb (119.296 kg)  05/01/13 263 lb 6.4 oz (119.477 kg)  03/27/13 266 lb 12 oz (120.997 kg)     HEENT: nl dentition, turbinates, and orophanx. Nl external ear canals without cough reflex   NECK :  without JVD/Nodes/TM/ nl carotid upstrokes bilaterally   LUNGS: no acc muscle use, clear to A and P bilaterally without cough on insp or exp maneuvers   CV:  RRR  no s3 or murmur or increase in P2, no edema   ABD:  soft and nontender with nl excursion in the supine position. No bruits or organomegaly, bowel sounds nl  MS:  warm without deformities, calf tenderness, cyanosis or clubbing  SKIN: warm and dry without lesions    NEURO:  alert, approp, no deficits   cxr 03/27/13  No active cardiopulmonary disease.     Assessment & Plan:

## 2013-05-31 NOTE — Patient Instructions (Addendum)
Pantoprazole (protonix) 40 mg   Take 30-60 min before first meal of the day and Pepcid 20 mg one bedtime until return to office - this is the best way to tell whether stomach acid is contributing to your problem.   GERD (REFLUX)  is an extremely common cause of respiratory symptoms, many times with no significant heartburn at all.    It can be treated with medication, but also with lifestyle changes including avoidance of late meals, excessive alcohol, smoking cessation, and avoid fatty foods, chocolate, peppermint, colas, red wine, and acidic juices such as orange juice.  NO MINT OR MENTHOL PRODUCTS SO NO COUGH DROPS  USE SUGARLESS CANDY INSTEAD (jolley ranchers or Stover's)  NO OIL BASED VITAMINS - use powdered substitutes.   dulera 100 2puffs every 12 hours if needed for cough or breathing and work on smooth deep breath - ok to substitute the 200 dulera (stronger) if you run out before next visit since you already have a prescription for this strength   Please schedule a follow up office visit in 6 weeks, call sooner if needed with pft's on return

## 2013-06-03 NOTE — Assessment & Plan Note (Addendum)
-   hfa 75% p coaching 05/30/2013   DDX of  difficult airways managment all start with A and  include Adherence, Ace Inhibitors, Acid Reflux, Active Sinus Disease, Alpha 1 Antitripsin deficiency, Anxiety masquerading as Airways dz,  ABPA,  allergy(esp in young), Aspiration (esp in elderly), Adverse effects of DPI,  Active smokers, plus two Bs  = Bronchiectasis and Beta blocker use..and one C= CHF   Adherence is always the initial "prime suspect" and is a multilayered concern that requires a "trust but verify" approach in every patient - starting with knowing how to use medications, especially inhalers, correctly, keeping up with refills and understanding the fundamental difference between maintenance and prns vs those medications only taken for a very short course and then stopped and not refilled. The proper method of use, as well as anticipated side effects, of a metered-dose inhaler are discussed and demonstrated to the patient. Improved effectiveness after extensive coaching during this visit to a level of approximately  75% from basline of < 25%   ? Acid reflux > strongly suggested by "something is stuck in my throat" and high risk based on obesity > rx max acid suppression and diet then return for pft's.  ? Active sinus dz > consider sinus ct next based on prominent upper airway symptoms    Each maintenance medication was reviewed in detail including most importantly the difference between maintenance and as needed and under what circumstances the prns are to be used.  Please see instructions for details which were reviewed in writing and the patient given a copy.

## 2013-07-12 ENCOUNTER — Encounter: Payer: Self-pay | Admitting: Internal Medicine

## 2013-07-12 ENCOUNTER — Ambulatory Visit (INDEPENDENT_AMBULATORY_CARE_PROVIDER_SITE_OTHER): Payer: Medicare Other | Admitting: Internal Medicine

## 2013-07-12 VITALS — BP 130/82 | HR 93 | Temp 98.1°F | Ht 58.5 in | Wt 262.0 lb

## 2013-07-12 DIAGNOSIS — R0600 Dyspnea, unspecified: Secondary | ICD-10-CM

## 2013-07-12 DIAGNOSIS — J45909 Unspecified asthma, uncomplicated: Secondary | ICD-10-CM

## 2013-07-12 DIAGNOSIS — R0989 Other specified symptoms and signs involving the circulatory and respiratory systems: Secondary | ICD-10-CM

## 2013-07-12 DIAGNOSIS — R05 Cough: Secondary | ICD-10-CM

## 2013-07-12 DIAGNOSIS — R059 Cough, unspecified: Secondary | ICD-10-CM

## 2013-07-12 DIAGNOSIS — R0609 Other forms of dyspnea: Secondary | ICD-10-CM

## 2013-07-12 LAB — PULMONARY FUNCTION TEST

## 2013-07-12 MED ORDER — MOMETASONE FURO-FORMOTEROL FUM 100-5 MCG/ACT IN AERO: INHALATION_SPRAY | RESPIRATORY_TRACT | Status: DC

## 2013-07-12 NOTE — Patient Instructions (Addendum)
Stay on the protonix(pantoprazole) and pepcid(famotidine)  and see if coughs stays gone  If you need the albuterol more than a couple times a week you need to be back on dulera 100 2 every 12 hours   Add chlortrimeton 4 mg at bedtime(over the counter) the see if the morning cough is better and if not the next step is See Heather Grant with all your medications, even over the counter meds, separated in two separate bags, the ones you take no matter what vs the ones you stop once you feel better and take only as needed when you feel you need them.   Heather Grant  will generate for you a new user friendly medication calendar that will put [place] all on the same page re: your medication use.     Without this process, it simply isn't possible to assure that we are providing  your outpatient care  with  the attention to detail we feel you deserve.   If we cannot assure that you're getting that kind of care,  then we cannot manage your problem effectively from this clinic.  Once you have seen Heather Grant and we are sure that we're all on the same page with your medication use she will arrange follow up with me.

## 2013-07-12 NOTE — Assessment & Plan Note (Signed)
-   hfa 75% p coaching 05/30/2013  - PFT's 07/12/2013 wnl p last saba/laba dose > 5 days prior  Nl pft's 5 days after last rx strongly suggest  Classic Upper airway cough syndrome, so named because it's frequently impossible to sort out how much is  CR/sinusitis with freq throat clearing (which can be related to primary GERD)   vs  causing  secondary (" extra esophageal")  GERD from wide swings in gastric pressure that occur with throat clearing, often  promoting self use of mint and menthol lozenges that reduce the lower esophageal sphincter tone and exacerbate the problem further in a cyclical fashion.   These are the same pts (now being labeled as having "irritable larynx syndrome" by some cough centers) who not infrequently have a history of having failed to tolerate ace inhibitors,  dry powder inhalers or biphosphonates or report having atypical reflux symptoms that don't respond to standard doses of PPI , and are easily confused as having aecopd or asthma flares by even experienced allergists/ pulmonologists.   Will add 1st gen h1 and ask her only to restart dulera 100 2 q 12 in event need for saba increases    Each maintenance medication was reviewed in detail including most importantly the difference between maintenance and as needed and under what circumstances the prns are to be used.  Please see instructions for details which were reviewed in writing and the patient given a copy.    F/u is prn

## 2013-07-12 NOTE — Progress Notes (Signed)
PFT done today. 

## 2013-07-12 NOTE — Progress Notes (Signed)
Subjective:    Patient ID: Heather Grant, female    DOB: [birth year]    MRN: [id number]   Brief patient profile:  71 yobf never smoker with ? New onset asthma in Dec 2013 referred 05/31/2013 to pulmonary clinic by Dr Debby Bud - did have one bad sinus infection age 72 but no resp problems subsequently or need for any type of resp/ allergy med in interim.   HPI  05/31/2013 1st pulmonary eval cc acute onset cough Dec 2013   treated in ER but   but ever since then cc daily symptoms of coughing day > night assoc with sob and worse when move around and productive of white mucus and feels like something stuck in throat. Some better from inhalers despite very poor hfa, no better with last prednisone course. rec Pantoprazole (protonix) 40 mg   Take 30-60 min before first meal of the day and Pepcid 20 mg one bedtime until return to office - this is the best way to tell whether stomach acid is contributing to your problem.  GERD diet  dulera 100 2puffs every 12 hours if needed for cough or breathing  07/12/2013 f/u ov/Nancy Arvin  Chief Complaint  Patient presents with  . Follow-up    Pt reports DOE unchanged, but her cough is doing much better. She denies any new co's today.     occ uses albuterol if sitting out side in heat ? Less while on dulera (not clear this is so) but overall better on the 100 dulera than the 200 and ran out of both 5 days prior to OV  s flare of day or noct cough or sob  No obvious daytime variabilty or assoc  cp or chest tightness, subjective wheeze overt sinus or hb symptoms. No unusual exp hx or h/o childhood pna/ asthma or knowledge of premature birth.   Sleeping ok without nocturnal  or early am exacerbation  of respiratory  c/o's or need for noct saba. Also denies any obvious fluctuation of symptoms with weather or environmental changes or other aggravating or alleviating factors except as outlined above   Current Medications, Allergies, Past Medical History, Past Surgical  History, Family History, and Social History were reviewed in [employer] record.  ROS  The following are not active complaints unless bolded sore throat, dysphagia, dental problems, itching, sneezing,  nasal congestion or excess/ purulent secretions, ear ache,   fever, chills, sweats, unintended wt loss, pleuritic or exertional cp, hemoptysis,  orthopnea pnd or leg swelling, presyncope, palpitations, heartburn, abdominal pain, anorexia, nausea, vomiting, diarrhea  or change in bowel or urinary habits, change in stools or urine, dysuria,hematuria,  rash, arthralgias, visual complaints, headache, numbness weakness or ataxia or problems with walking or coordination,  change in mood/affect or memory.            Objective:   Physical Exam   Obese bf no longer needs one person assist to get on table / prominent pseudowheeze resolved   07/12/2013        262  Wt Readings from Last 3 Encounters:  05/31/13 263 lb (119.296 kg)  05/01/13 263 lb 6.4 oz (119.477 kg)  03/27/13 266 lb 12 oz (120.997 kg)     HEENT: nl dentition, turbinates, and orophanx. Nl external ear canals without cough reflex   NECK :  without JVD/Nodes/TM/ nl carotid upstrokes bilaterally   LUNGS: no acc muscle use, clear to A and P bilaterally without cough on insp or exp maneuvers  CV:  RRR  no s3 or murmur or increase in P2, no edema   ABD:  soft and nontender with nl excursion in the supine position. No bruits or organomegaly, bowel sounds nl  MS:  warm without deformities, calf tenderness, cyanosis or clubbing  SKIN: warm and dry without lesions    NEURO:  alert, approp, no deficits     cxr 03/27/13  No active cardiopulmonary disease.     Assessment & Plan:

## 2013-07-25 ENCOUNTER — Other Ambulatory Visit: Payer: Self-pay | Admitting: Internal Medicine

## 2013-07-26 ENCOUNTER — Encounter: Payer: Self-pay | Admitting: Internal Medicine

## 2013-09-05 ENCOUNTER — Telehealth: Payer: Self-pay | Admitting: Internal Medicine

## 2013-09-05 MED ORDER — PANTOPRAZOLE SODIUM 40 MG PO TBEC: 40.0000 mg | DELAYED_RELEASE_TABLET | Freq: Every day | ORAL | Status: DC

## 2013-09-05 MED ORDER — FAMOTIDINE 20 MG PO TABS: ORAL_TABLET | ORAL | Status: DC

## 2013-09-05 NOTE — Telephone Encounter (Signed)
Refills sent. Pt is aware. Tonika Eden, CMA  

## 2013-09-06 ENCOUNTER — Other Ambulatory Visit: Payer: Self-pay

## 2013-09-06 ENCOUNTER — Other Ambulatory Visit: Payer: Self-pay | Admitting: Internal Medicine

## 2013-09-06 DIAGNOSIS — Z1231 Encounter for screening mammogram for malignant neoplasm of breast: Secondary | ICD-10-CM

## 2013-10-06 ENCOUNTER — Other Ambulatory Visit: Payer: Self-pay | Admitting: Internal Medicine

## 2013-10-06 MED ORDER — PANTOPRAZOLE SODIUM 40 MG PO TBEC: 40.0000 mg | DELAYED_RELEASE_TABLET | Freq: Every day | ORAL | Status: DC

## 2013-10-06 MED ORDER — FAMOTIDINE 20 MG PO TABS: ORAL_TABLET | ORAL | Status: DC

## 2013-10-09 ENCOUNTER — Ambulatory Visit
Admission: RE | Admit: 2013-10-09 | Discharge: 2013-10-09 | Disposition: A | Discharge status: Home / Self Care | Payer: Medicare Other | Source: Ambulatory Visit

## 2013-10-09 ENCOUNTER — Other Ambulatory Visit: Payer: Self-pay

## 2013-10-09 DIAGNOSIS — Z1231 Encounter for screening mammogram for malignant neoplasm of breast: Secondary | ICD-10-CM

## 2013-10-09 MED ORDER — NABUMETONE 750 MG PO TABS: 750.0000 mg | ORAL_TABLET | Freq: Two times a day (BID) | ORAL | Status: DC | PRN

## 2013-10-09 MED ORDER — VALSARTAN 320 MG PO TABS: 320.0000 mg | ORAL_TABLET | Freq: Every day | ORAL | Status: DC

## 2013-10-09 MED ORDER — METOPROLOL SUCCINATE ER 25 MG PO TB24: 25.0000 mg | ORAL_TABLET | Freq: Every day | ORAL | Status: DC

## 2013-10-14 IMAGING — CR DG TIBIA/FIBULA 2V*L*
2 series · 2 of 2 positions shown · non-contrast
Comparison: None.

CLINICAL DATA: Compartment syndrome of the left lower leg

LEFT TIBIA AND FIBULA - 2 VIEW

[view not recorded (1 of 2)]
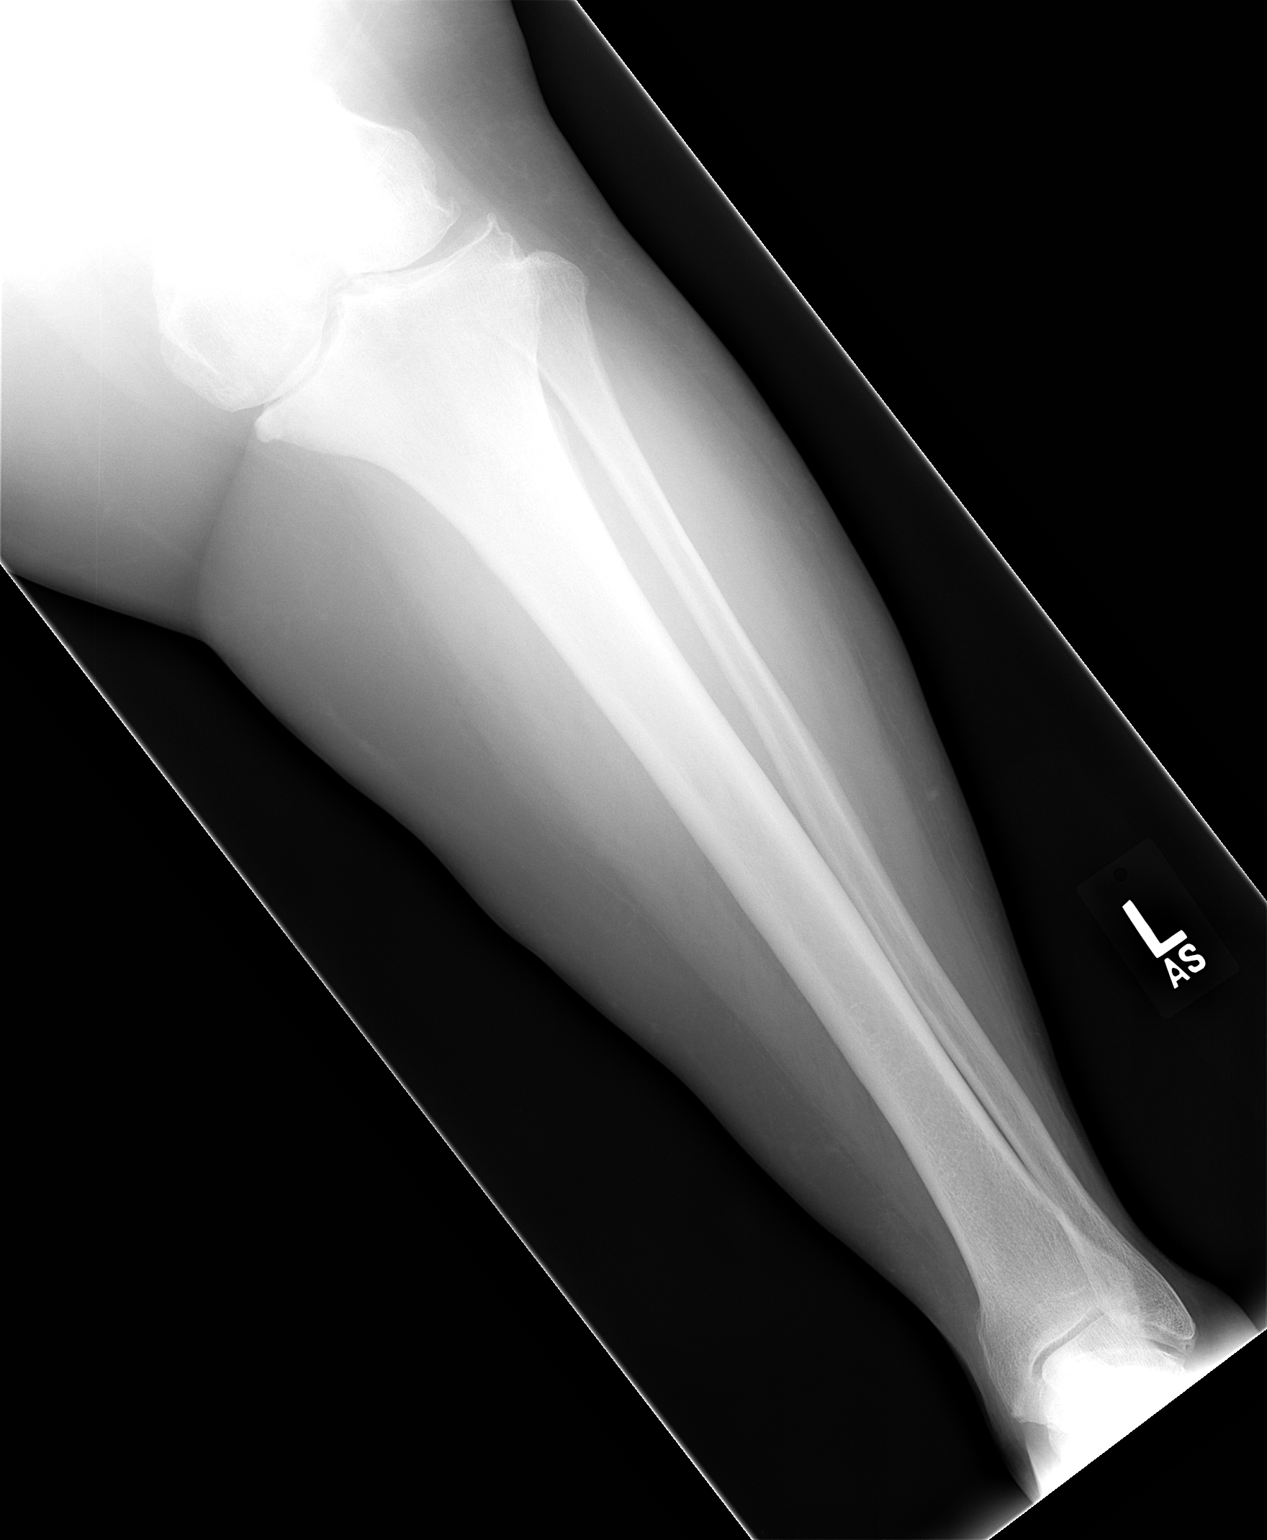

[view not recorded (2 of 2)]
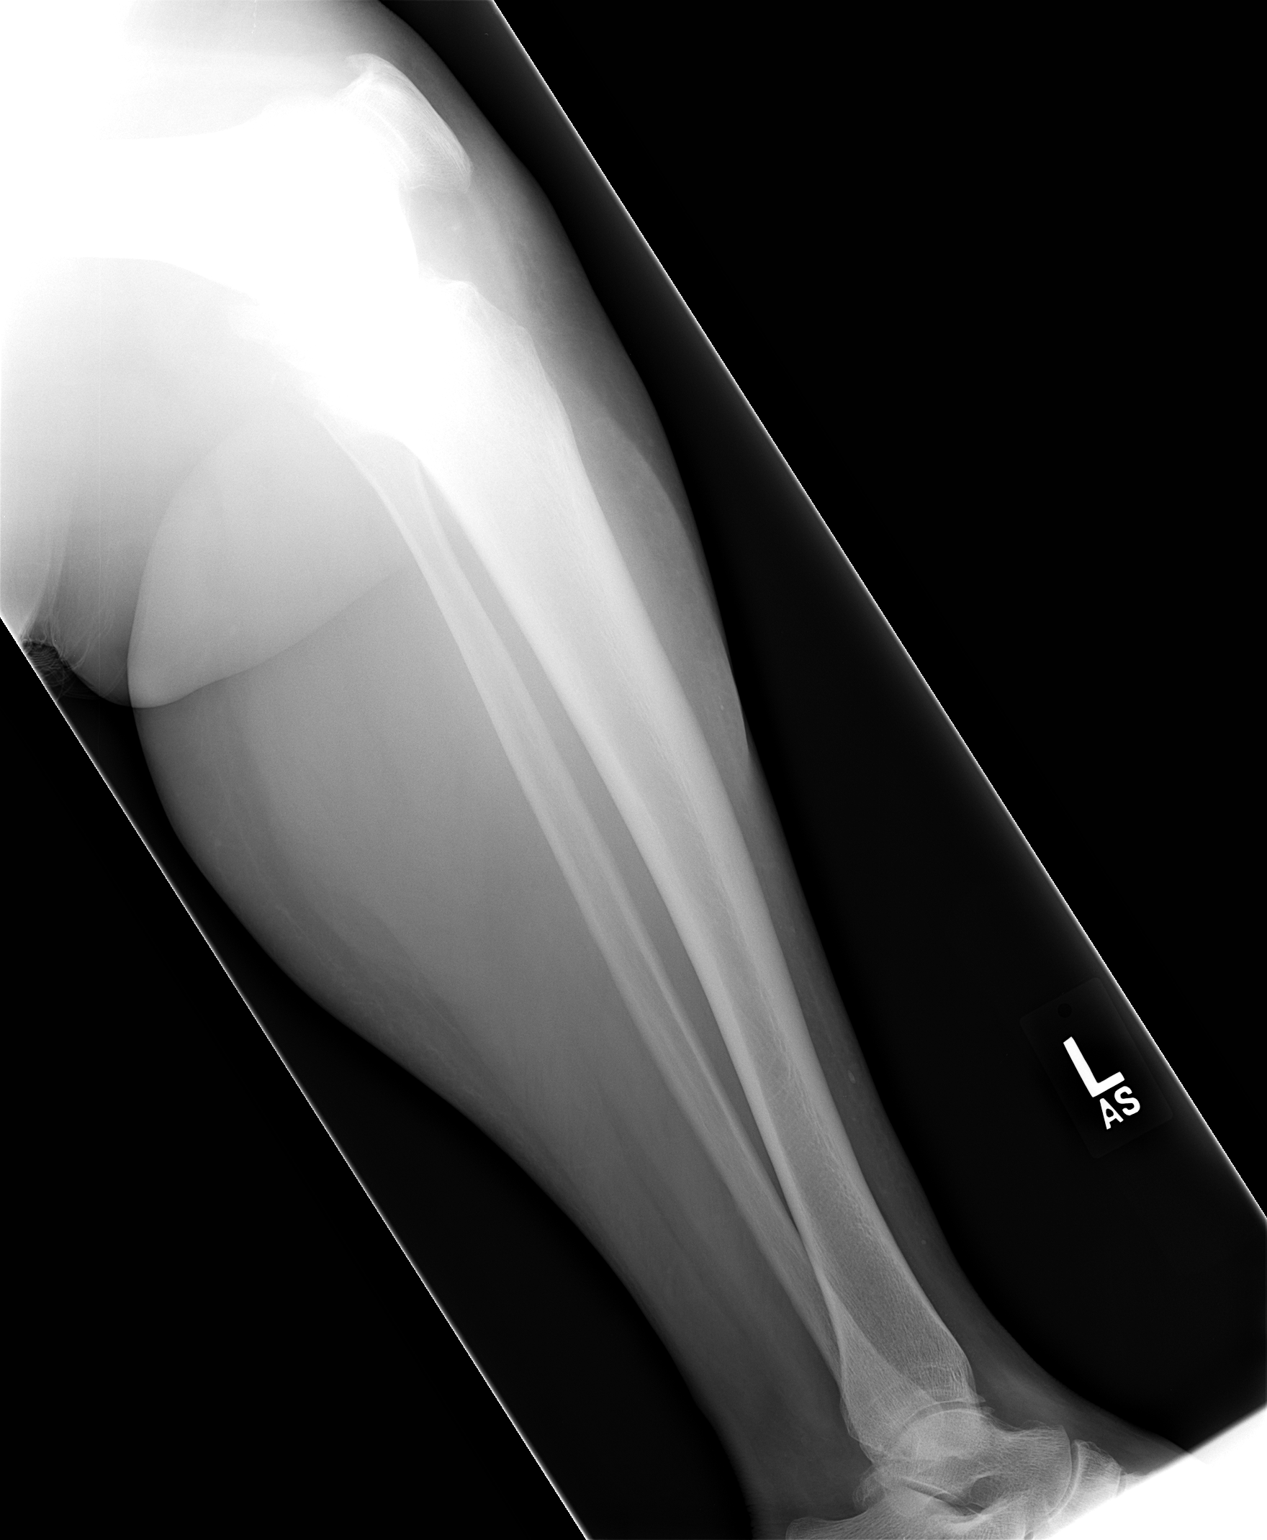

[2 of 2 positions shown; findings below may reference images not displayed]

FINDINGS: Degenerative joint disease is noted involving the left
knee with considerable loss of joint space medially.  No acute bony
abnormality is seen.  The ankle joint is unremarkable.  Alignment
is normal.
IMPRESSION: No acute abnormality.  Degenerative change in the left knee as
noted above.

## 2013-10-19 ENCOUNTER — Other Ambulatory Visit: Payer: Self-pay | Admitting: Internal Medicine

## 2014-01-09 ENCOUNTER — Ambulatory Visit: Payer: Medicare Other | Admitting: Internal Medicine

## 2014-02-13 ENCOUNTER — Encounter: Payer: Self-pay | Admitting: Internal Medicine

## 2014-02-13 ENCOUNTER — Other Ambulatory Visit (INDEPENDENT_AMBULATORY_CARE_PROVIDER_SITE_OTHER): Payer: Medicare Other

## 2014-02-13 ENCOUNTER — Ambulatory Visit (INDEPENDENT_AMBULATORY_CARE_PROVIDER_SITE_OTHER): Payer: Medicare Other | Admitting: Internal Medicine

## 2014-02-13 VITALS — BP 146/92 | HR 71 | Temp 97.8°F | Resp 16 | Ht 59.0 in | Wt 249.0 lb

## 2014-02-13 DIAGNOSIS — R7309 Other abnormal glucose: Secondary | ICD-10-CM

## 2014-02-13 DIAGNOSIS — M109 Gout, unspecified: Secondary | ICD-10-CM

## 2014-02-13 DIAGNOSIS — I10 Essential (primary) hypertension: Secondary | ICD-10-CM

## 2014-02-13 DIAGNOSIS — E785 Hyperlipidemia, unspecified: Secondary | ICD-10-CM

## 2014-02-13 DIAGNOSIS — E669 Obesity, unspecified: Secondary | ICD-10-CM

## 2014-02-13 LAB — COMPREHENSIVE METABOLIC PANEL
ALT: 18 U/L (ref 0–35)
AST: 19 U/L (ref 0–37)
Albumin: 4.1 g/dL (ref 3.5–5.2)
Alkaline Phosphatase: 69 U/L (ref 39–117)
BUN: 11 mg/dL (ref 6–23)
CO2: 27 mEq/L (ref 19–32)
Calcium: 9.9 mg/dL (ref 8.4–10.5)
Chloride: 104 mEq/L (ref 96–112)
Creatinine, Ser: 1.2 mg/dL (ref 0.4–1.2)
GFR: 55.88 mL/min — ABNORMAL LOW (ref >60.00)
Glucose, Bld: 87 mg/dL (ref 70–99)
Potassium: 4.1 mEq/L (ref 3.5–5.1)
Sodium: 141 mEq/L (ref 135–145)
Total Bilirubin: 0.6 mg/dL (ref 0.3–1.2)
Total Protein: 7.2 g/dL (ref 6.0–8.3)

## 2014-02-13 LAB — LIPID PANEL
Cholesterol: 273 mg/dL — ABNORMAL HIGH (ref 0–200)
HDL: 52 mg/dL (ref >39.00)
LDL Cholesterol: 187 mg/dL — ABNORMAL HIGH (ref 0–99)
Total CHOL/HDL Ratio: 5
Triglycerides: 171 mg/dL — ABNORMAL HIGH (ref 0.0–149.0)
VLDL: 34.2 mg/dL (ref 0.0–40.0)

## 2014-02-13 LAB — HEMOGLOBIN A1C: Hgb A1c MFr Bld: 5.4 % (ref 4.6–6.5)

## 2014-02-13 LAB — URIC ACID: Uric Acid, Serum: 8.5 mg/dL — ABNORMAL HIGH (ref 2.4–7.0)

## 2014-02-13 MED ORDER — COLCHICINE 0.6 MG PO TABS: 0.6000 mg | ORAL_TABLET | ORAL | Status: DC | PRN

## 2014-02-13 MED ORDER — MUPIROCIN CALCIUM 2 % EX CREA: 1.0000 "application " | TOPICAL_CREAM | Freq: Two times a day (BID) | CUTANEOUS | Status: DC

## 2014-02-13 NOTE — Progress Notes (Signed)
Pre visit review using our clinic review tool, if applicable. No additional management support is needed unless otherwise documented below in the visit note. 
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
## 2014-02-13 NOTE — Patient Instructions (Signed)
1. Lesion on the left breast - a resolving infected hair follicle. No breast lump or nodule Plan Wash twice a day with soap and water   Apply small amount of mupirocin ointment after washing  2. Gout - several flares a year. Plan Take allopurinol every day  Take colchicine only for a flare of gout: 2 tablets at onset of pain. May take 1 tablet 2 hours later if needed  Will check uric acid level today  3. Blood pressure - ok control on present medications  4. Cholesterol -  Plan Lab today - cholesterol levels with recommendation to follow   5. Obesity - your body mass index (BMI) is very high at 50, normal is 19-25.  Plan Diet management: smart food choices, PORTION SIZE CONTROL, regular exercise. Goal - to loose 1-2 lbs.month. Target weight -200 lbs (50 lbs weight loss over 4 years)   THIS IS VERY IMPORTANT FOR YOUR OVERALL HEALTH  All your lab results will be sent by mail -  You should get a letter next Tuesday or Wednesday.

## 2014-02-14 ENCOUNTER — Telehealth: Payer: Self-pay | Admitting: Internal Medicine

## 2014-02-14 NOTE — Assessment & Plan Note (Signed)
BP Readings from Last 3 Encounters:  02/13/14 146/92  07/12/13 130/82  05/31/13 170/92   Adequate control of BP on present medications

## 2014-02-14 NOTE — Assessment & Plan Note (Signed)
Gout - several flares a year. Plan Take allopurinol every day  Take colchicine only for a flare of gout: 2 tablets at onset of pain. May take 1 tablet 2 hours later if needed  Will check uric acid level today

## 2014-02-14 NOTE — Assessment & Plan Note (Signed)
Cholesterol -  Plan Lab today - cholesterol levels with recommendation to follow

## 2014-02-14 NOTE — Telephone Encounter (Signed)
Relevant patient education mailed to patient.  

## 2014-02-14 NOTE — Assessment & Plan Note (Signed)
Obesity - your body mass index (BMI) is very high at 50, normal is 19-25.  Plan Diet management: smart food choices, PORTION SIZE CONTROL, regular exercise. Goal - to loose 1-2 lbs.month. Target weight -200 lbs (50 lbs weight loss over 4 years)   [place]

## 2014-02-16 ENCOUNTER — Encounter: Payer: Self-pay | Admitting: Internal Medicine

## 2014-02-16 DIAGNOSIS — E785 Hyperlipidemia, unspecified: Secondary | ICD-10-CM

## 2014-02-16 MED ORDER — PRAVASTATIN SODIUM 40 MG PO TABS: 40.0000 mg | ORAL_TABLET | Freq: Every day | ORAL | Status: DC

## 2014-03-15 ENCOUNTER — Ambulatory Visit (INDEPENDENT_AMBULATORY_CARE_PROVIDER_SITE_OTHER): Payer: Medicare Other | Admitting: Internal Medicine

## 2014-03-15 ENCOUNTER — Ambulatory Visit: Payer: Medicare Other | Admitting: Internal Medicine

## 2014-03-15 ENCOUNTER — Encounter: Payer: Self-pay | Admitting: Internal Medicine

## 2014-03-15 VITALS — BP 140/84 | HR 73 | Temp 98.0°F | Ht <= 58 in | Wt 237.0 lb

## 2014-03-15 DIAGNOSIS — J45909 Unspecified asthma, uncomplicated: Secondary | ICD-10-CM

## 2014-03-15 DIAGNOSIS — J310 Chronic rhinitis: Secondary | ICD-10-CM | POA: Insufficient documentation

## 2014-03-15 MED ORDER — PREDNISONE 10 MG PO TABS: ORAL_TABLET | ORAL | Status: DC

## 2014-03-15 NOTE — Progress Notes (Signed)
Subjective:    Patient ID: Heather Grant, female    DOB: [birth year]    MRN: [id number]   Brief patient profile:  49 yobf never smoker with ? New onset asthma in Dec 2013 referred 05/31/2013 to pulmonary clinic by Dr Linda Hedges - did have one bad sinus infection age 73 but no resp problems subsequently or need for any type of resp/ allergy med in interim.     History of Present Illness  05/31/2013 1st pulmonary eval cc acute onset cough Dec 2013   treated in ER but   but ever since then cc daily symptoms of coughing day > night assoc with sob and worse when move around and productive of white mucus and feels like something stuck in throat. Some better from inhalers despite very poor hfa, no better with last prednisone course. rec Pantoprazole (protonix) 40 mg   Take 30-60 min before first meal of the day and Pepcid 20 mg one bedtime until return to office - this is the best way to tell whether stomach acid is contributing to your problem.  GERD diet  dulera 100 2puffs every 12 hours if needed for cough or breathing  07/12/2013 f/u ov/Heather Grant  Chief Complaint  Patient presents with  . Follow-up    Pt reports DOE unchanged, but her cough is doing much better. She denies any new co's today.     occ uses albuterol if sitting out side in heat ? Less while on dulera (not clear this is so) but overall better on the 100 dulera than the 200 and ran out of both 5 days prior to OV  s flare of day or noct cough or sob rec Stay on the protonix(pantoprazole) and pepcid(famotidine)  and see if coughs stays gone If you need the albuterol more than a couple times a week you need to be back on dulera 100 2 every 12 hours  Add chlortrimeton 4 mg at bedtime(over the counter) the see if the morning cough is better > resolved   03/15/2014 acute  ov/Heather Grant re: nasal symptoms/ maint on dulera 100 2bid  Chief Complaint  Patient presents with  . Follow-up    Sinus pressure, PND and cough with thick white mucus x2  weeks   did fine on rx despite poor hfa using dulera 100 2bid until 2 week prior to OV  With noct cough/ nasal chest and congestion but no purulent secretions.  On ppi qam ac and h2 hs maint rx  No better on 1st gen h1 prn   No obvious daytime variabilty or assoc  cp or chest tightness, subjective wheeze overt  hb symptoms. No unusual exp hx or h/o childhood pna/ asthma or knowledge of premature birth.   Sleeping ok without nocturnal  or early am exacerbation  of respiratory  c/o's or need for noct saba. Also denies any obvious fluctuation of symptoms with weather or environmental changes or other aggravating or alleviating factors except as outlined above   Current Medications, Allergies, Past Medical History, Past Surgical History, Family History, and Social History were reviewed in [employer] record.  ROS  The following are not active complaints unless bolded sore throat, dysphagia, dental problems, itching, sneezing,  nasal congestion or excess/ purulent secretions, ear ache,   fever, chills, sweats, unintended wt loss, pleuritic or exertional cp, hemoptysis,  orthopnea pnd or leg swelling, presyncope, palpitations, heartburn, abdominal pain, anorexia, nausea, vomiting, diarrhea  or change in bowel or urinary habits, change in stools  or urine, dysuria,hematuria,  rash, arthralgias, visual complaints, headache, numbness weakness or ataxia or problems with walking or coordination,  change in mood/affect or memory.            Objective:   Physical Exam   Obese bf nad    07/12/2013        262  > 03/15/2014 234  Wt Readings from Last 3 Encounters:  05/31/13 263 lb (119.296 kg)  05/01/13 263 lb 6.4 oz (119.477 kg)  03/27/13 266 lb 12 oz (120.997 kg)     HEENT: nl dentition,  and orophanx. Nl external ear canals without cough reflex Moderate non-specific turbinate edema bilaterally    NECK :  without JVD/Nodes/TM/ nl carotid upstrokes bilaterally   LUNGS: no  acc muscle use, clear to A and P bilaterally without cough on insp or exp maneuvers   CV:  RRR  no s3 or murmur or increase in P2, no edema   ABD:  soft and nontender with nl excursion in the supine position. No bruits or organomegaly, bowel sounds nl  MS:  warm without deformities, calf tenderness, cyanosis or clubbing  SKIN: warm and dry without lesions    NEURO:  alert, approp, no deficits     cxr 03/27/13  No active cardiopulmonary disease.     Assessment & Plan:

## 2014-03-15 NOTE — Patient Instructions (Signed)
For nose dripping take chlorpheniramine 4 mg every 4 hour but may cause drowsiness   dulera Take 2 puffs first thing in am and then another 2 puffs about 12 hours later - work on smooth deep breath in and out thru nose  Prednisone 10 mg take  4 each am x 2 days,   2 each am x 2 days,  1 each am x 2 days and stop   If not 100% better next step is See Tammy NP   with all your medications, even over the counter meds, separated in two separate bags, the ones you take no matter what vs the ones you stop once you feel better and take only as needed when you feel you need them.   Tammy  will generate for you a new user friendly medication calendar that will put [place] all on the same page re: your medication use.     Without this process, it simply isn't possible to assure that we are providing  your outpatient care  with  the attention to detail we feel you deserve.   If we cannot assure that you're getting that kind of care,  then we cannot manage your problem effectively from this clinic.  Once you have seen Tammy and we are sure that we're all on the same page with your medication use she will arrange follow up with me.

## 2014-03-15 NOTE — Assessment & Plan Note (Signed)
Mild flare ? Allergic as corresponds to allergy season but not responding to 1st gen H1 > rec Prednisone 10 mg take  4 each am x 2 days,   2 each am x 2 days,  1 each am x 2 days and stop then regroup with all meds in hand if not better

## 2014-03-15 NOTE — Assessment & Plan Note (Signed)
-   PFT's 07/12/2013 wnl p last saba/laba dose > 5 days prior  Despite flare of rhinitis no evidence of true asthma flare but she's convinced better on dulera and not needing saba while on dulera 100 despite poor hfa  The proper method of use, as well as anticipated side effects, of a metered-dose inhaler are discussed and demonstrated to the patient. Improved effectiveness after extensive coaching during this visit to a level of approximately  75% so ok to continue

## 2014-03-23 ENCOUNTER — Other Ambulatory Visit: Payer: Self-pay | Admitting: *Deleted

## 2014-03-23 MED ORDER — ALLOPURINOL 300 MG PO TABS: 300.0000 mg | ORAL_TABLET | Freq: Every day | ORAL | Status: DC

## 2014-03-27 ENCOUNTER — Other Ambulatory Visit: Payer: Self-pay | Admitting: *Deleted

## 2014-03-27 MED ORDER — ALLOPURINOL 300 MG PO TABS: 300.0000 mg | ORAL_TABLET | Freq: Every day | ORAL | Status: DC

## 2014-05-26 IMAGING — CR DG KNEE 1-2V*L*
2 series · 2 of 2 positions shown · non-contrast
Comparison: Left tib-fib films of 12/11/2011

CLINICAL DATA: Knee pain, no injury

LEFT KNEE - 1-2 VIEW

[w knee ap left]
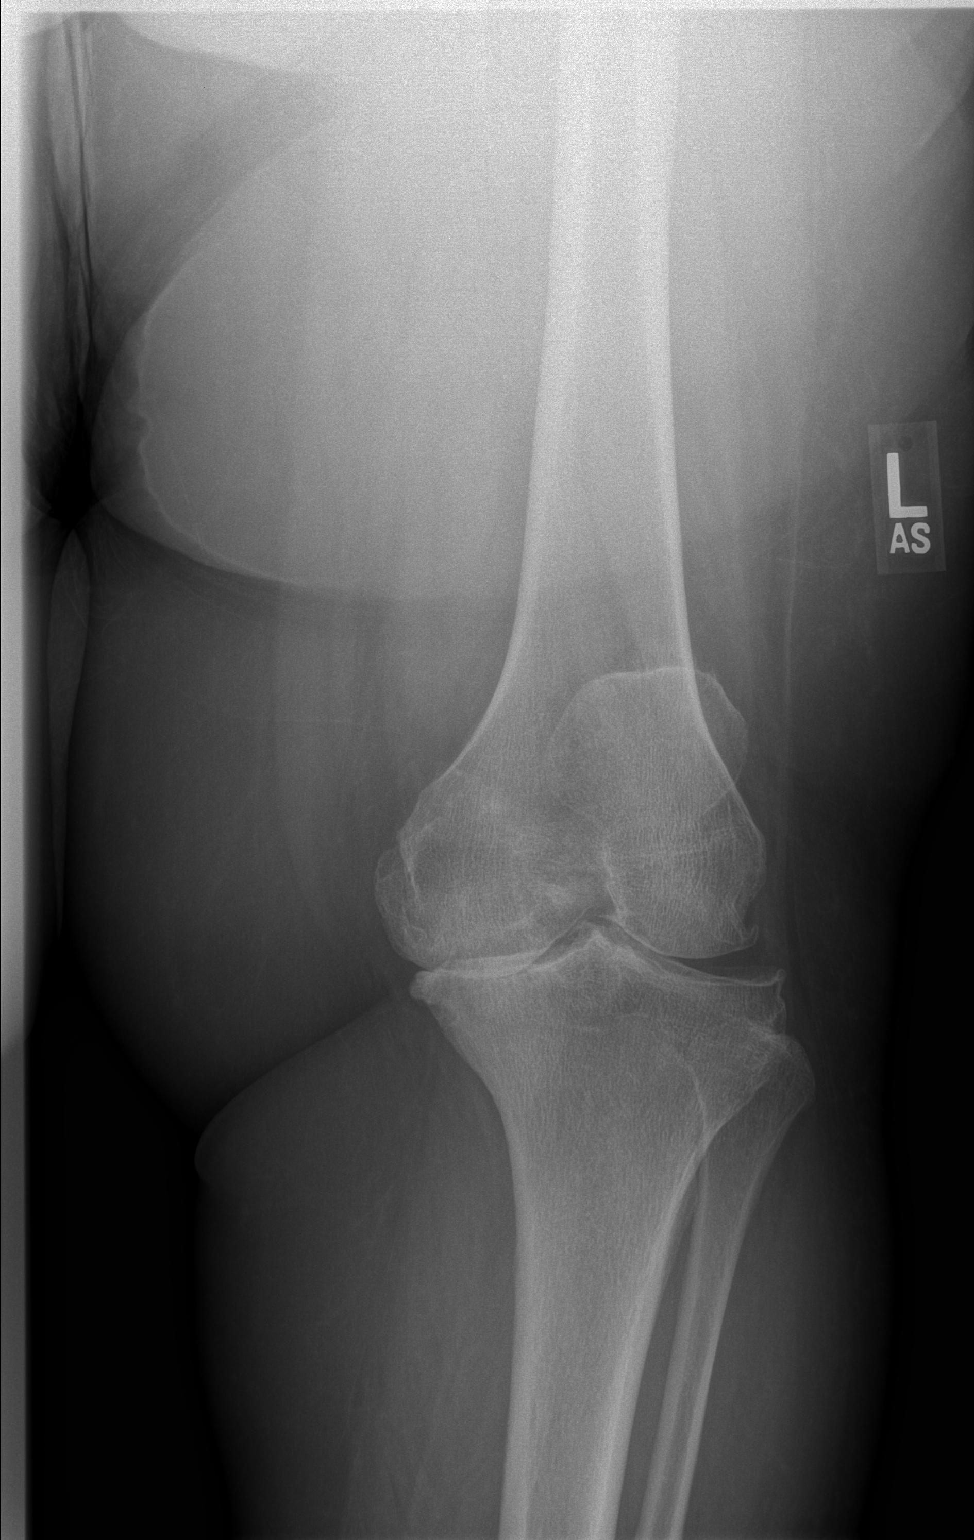

[w knee lat. left]
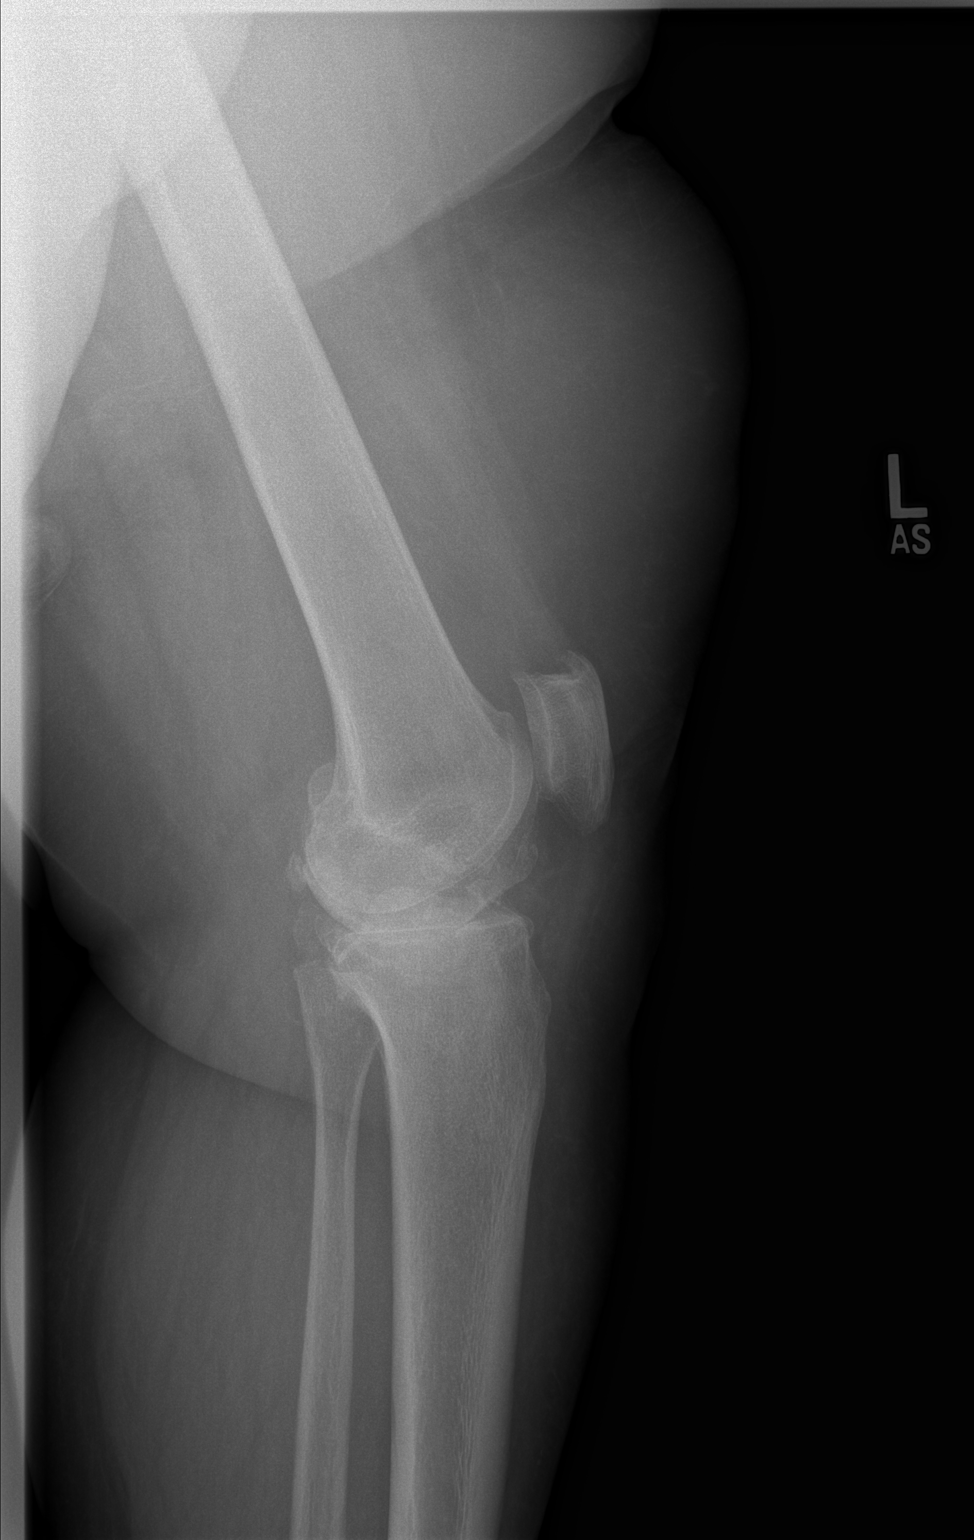

[2 of 2 positions shown; findings below may reference images not displayed]

FINDINGS: There is tricompartmental degenerative joint disease left
knee primarily involving the medial compartment with complete loss
of joint space at that site with sclerosis and spurring.  No acute
bony abnormality is seen.  No definite effusion.
IMPRESSION: Tricompartmental degenerative joint disease primarily involving the
medial compartment.

## 2014-07-23 ENCOUNTER — Other Ambulatory Visit: Payer: Self-pay | Admitting: Internal Medicine

## 2014-07-23 MED ORDER — FAMOTIDINE 20 MG PO TABS: ORAL_TABLET | ORAL | Status: DC

## 2014-07-23 MED ORDER — PANTOPRAZOLE SODIUM 40 MG PO TBEC: 40.0000 mg | DELAYED_RELEASE_TABLET | Freq: Every day | ORAL | Status: DC

## 2014-07-25 ENCOUNTER — Other Ambulatory Visit (INDEPENDENT_AMBULATORY_CARE_PROVIDER_SITE_OTHER): Payer: Medicare Other

## 2014-07-25 ENCOUNTER — Ambulatory Visit (INDEPENDENT_AMBULATORY_CARE_PROVIDER_SITE_OTHER): Payer: Medicare Other | Admitting: Internal Medicine

## 2014-07-25 ENCOUNTER — Encounter: Payer: Self-pay | Admitting: Internal Medicine

## 2014-07-25 VITALS — BP 174/80 | HR 68 | Temp 97.8°F | Wt 232.8 lb

## 2014-07-25 DIAGNOSIS — M1A00X0 Idiopathic chronic gout, unspecified site, without tophus (tophi): Secondary | ICD-10-CM

## 2014-07-25 DIAGNOSIS — M1A0710 Idiopathic chronic gout, right ankle and foot, without tophus (tophi): Secondary | ICD-10-CM

## 2014-07-25 DIAGNOSIS — I10 Essential (primary) hypertension: Secondary | ICD-10-CM

## 2014-07-25 DIAGNOSIS — M1A9XX0 Chronic gout, unspecified, without tophus (tophi): Secondary | ICD-10-CM

## 2014-07-25 LAB — BASIC METABOLIC PANEL
BUN: 11 mg/dL (ref 6–23)
CO2: 27 mEq/L (ref 19–32)
Calcium: 10.1 mg/dL (ref 8.4–10.5)
Chloride: 109 mEq/L (ref 96–112)
Creatinine, Ser: 1.2 mg/dL (ref 0.4–1.2)
GFR: 59.74 mL/min — ABNORMAL LOW (ref >60.00)
Glucose, Bld: 97 mg/dL (ref 70–99)
Potassium: 3.7 mEq/L (ref 3.5–5.1)
Sodium: 143 mEq/L (ref 135–145)

## 2014-07-25 LAB — URIC ACID: Uric Acid, Serum: 8.2 mg/dL — ABNORMAL HIGH (ref 2.4–7.0)

## 2014-07-25 MED ORDER — METOPROLOL TARTRATE 25 MG PO TABS: 25.0000 mg | ORAL_TABLET | Freq: Two times a day (BID) | ORAL | Status: DC

## 2014-07-25 NOTE — Patient Instructions (Signed)
Minimal Blood Pressure Goal= AVERAGE < 150/90;  Ideal is an AVERAGE < 140/85. This AVERAGE should be calculated from @ least 5-7 BP readings taken @ different times of day on different days of week. You should not respond to isolated BP readings , but rather the AVERAGE for that week .Please bring your  blood pressure cuff to office visits to verify that it is reliable.It  can also be checked against the blood pressure device at the pharmacy. Finger or wrist cuffs are not dependable; an arm cuff is.  Your next office appointment will be determined based upon review of your pending labs . Those instructions will be transmitted to you  by mail.

## 2014-07-25 NOTE — Progress Notes (Signed)
Pre visit review using our clinic review tool, if applicable. No additional management support is needed unless otherwise documented below in the visit note. 

## 2014-07-25 NOTE — Progress Notes (Signed)
   Subjective:    Patient ID: Heather Grant, female    DOB: [birth year], 73 y.o.   MRN: [id number]  HPI  She has been taking her blood pressure medications; she denies any adverse effects. She is not monitoring blood pressure at home. She does restrict salt  She is not exercising because of degenerative joint disease of knees. She does attempt to walk around her complex but must employ a walker. Her orthopedist has suggested total knee replacement.  She's had minor bitemporal headaches for which she takes BCs on occasion.  She describes some calf discomfort when she attempts to mobilize; there is no definite claudication. She does have intermittent edema.  She has a past history of gout involving the right foot. She is on colchicine and allopurinol.    Review of Systems   She denies epistaxis, chest pain, palpitations, paroxysmal nocturnal dyspnea, or lightheadedness.  She does have some excessive thirst and hunger  She also has some myalgias.     Objective:   Physical Exam Positive or pertinent findings include: As per CDC guidelines severe obesity a is present Abdomen is protuberant w/o organomegaly or masses Posterior tibial pulses are decreased.  General appearance :adequately nourished; in no distress. Eyes: No conjunctival inflammation or scleral icterus is present. Heart:  Normal rate and regular rhythm. S1 and S2 normal without gallop, murmur, click, rub or other extra sounds   Lungs:Chest clear to auscultation; no wheezes, rhonchi,rales ,or rubs present.No increased work of breathing.  Skin:Warm & dry.  Intact without suspicious lesions or rashes ; no jaundice or tenting Lymphatic: No lymphadenopathy is noted about the head, neck, axilla           Assessment & Plan:  See Current Assessment & Plan in Problem List under specific Diagnosis

## 2014-07-26 ENCOUNTER — Ambulatory Visit: Payer: Medicare Other | Admitting: Nurse Practitioner

## 2014-07-26 NOTE — Assessment & Plan Note (Signed)
Blood pressure goals reviewed. BMET 

## 2014-07-26 NOTE — Assessment & Plan Note (Signed)
Uric acid

## 2014-08-13 ENCOUNTER — Encounter: Payer: Self-pay | Admitting: Internal Medicine

## 2014-08-13 ENCOUNTER — Ambulatory Visit (INDEPENDENT_AMBULATORY_CARE_PROVIDER_SITE_OTHER): Payer: Medicare Other | Admitting: Internal Medicine

## 2014-08-13 ENCOUNTER — Ambulatory Visit: Payer: Medicare Other | Admitting: Internal Medicine

## 2014-08-13 VITALS — BP 158/72 | HR 66 | Temp 98.7°F | Resp 22 | Ht <= 58 in | Wt 234.1 lb

## 2014-08-13 DIAGNOSIS — E785 Hyperlipidemia, unspecified: Secondary | ICD-10-CM | POA: Diagnosis not present

## 2014-08-13 DIAGNOSIS — I10 Essential (primary) hypertension: Secondary | ICD-10-CM

## 2014-08-13 DIAGNOSIS — J45909 Unspecified asthma, uncomplicated: Secondary | ICD-10-CM

## 2014-08-13 DIAGNOSIS — M170 Bilateral primary osteoarthritis of knee: Secondary | ICD-10-CM

## 2014-08-13 DIAGNOSIS — M1A00X0 Idiopathic chronic gout, unspecified site, without tophus (tophi): Secondary | ICD-10-CM

## 2014-08-13 DIAGNOSIS — G43909 Migraine, unspecified, not intractable, without status migrainosus: Secondary | ICD-10-CM

## 2014-08-13 DIAGNOSIS — M1710 Unilateral primary osteoarthritis, unspecified knee: Secondary | ICD-10-CM

## 2014-08-13 DIAGNOSIS — M1A0710 Idiopathic chronic gout, right ankle and foot, without tophus (tophi): Secondary | ICD-10-CM

## 2014-08-13 NOTE — Progress Notes (Signed)
Pre visit review using our clinic review tool, if applicable. No additional management support is needed unless otherwise documented below in the visit note. 

## 2014-08-13 NOTE — Patient Instructions (Signed)
We will check your blood tests next time. You are doing great with your health. Keep trying to work on losing some more weight to help take the pressure off your knees.   Come back in about 6-12 months. Please feel free to call us sooner if you are feeling sick of have problems.   Exercise to Stay Healthy Exercise helps you become and stay healthy. EXERCISE IDEAS AND TIPS Choose exercises that:  You enjoy.  Fit into your day. You do not need to exercise really hard to be healthy. You can do exercises at a slow or medium level and stay healthy. You can:  Stretch before and after working out.  Try yoga, Pilates, or tai chi.  Lift weights.  Walk fast, swim, jog, run, climb stairs, bicycle, dance, or rollerskate.  Take aerobic classes. Exercises that burn about 150 calories:  Running 1  miles in 15 minutes.  Playing volleyball for 45 to 60 minutes.  Washing and waxing a car for 45 to 60 minutes.  Playing touch football for 45 minutes.  Walking 1  miles in 35 minutes.  Pushing a stroller 1  miles in 30 minutes.  Playing basketball for 30 minutes.  Raking leaves for 30 minutes.  Bicycling 5 miles in 30 minutes.  Walking 2 miles in 30 minutes.  Dancing for 30 minutes.  Shoveling snow for 15 minutes.  Swimming laps for 20 minutes.  Walking up stairs for 15 minutes.  Bicycling 4 miles in 15 minutes.  Gardening for 30 to 45 minutes.  Jumping rope for 15 minutes.  Washing windows or floors for 45 to 60 minutes. Document Released: 12/19/2010 Document Revised: 02/08/2012 Document Reviewed: 12/19/2010 [facility] Patient Information 2015 [place], [place]. This information is not intended to replace advice given to you by your health care provider. Make sure you discuss any questions you have with your health care provider.

## 2014-08-14 NOTE — Assessment & Plan Note (Signed)
She is not on statin at this time and will recheck lipid panel at next visit. She is working on her diet.

## 2014-08-14 NOTE — Assessment & Plan Note (Signed)
BP mildly elevated today and will monitor. If continues to be elevated will need titration of therapy.

## 2014-08-14 NOTE — Assessment & Plan Note (Signed)
Her knees do bother her but she does not want to pursue knee replacement at this time.

## 2014-08-14 NOTE — Assessment & Plan Note (Signed)
She does well with dulera and has not needed her albuterol inhaler recently.

## 2014-08-14 NOTE — Assessment & Plan Note (Signed)
She has not had as many migraines recently and will watch.

## 2014-08-14 NOTE — Progress Notes (Signed)
   Subjective:    Patient ID: Heather Grant, female    DOB: [birth year], 73 y.o.   MRN: [id number]  HPI The patient is a 73 YO female who is coming in today to establish care. She does have PMH of asthma, gout, HTN, osteoarthritis. She is not having any new problems today. She does not have to use her albuterol since she has been using her dulera. She does have gout and has not had a flare in some time but she faithfully take her allopurinol. Her main complaint is that her knees are hurting her and she is having more pain to get around. She is not ready to have knee replacement as she is still getting around. She denies chest pains, SOB, abdominal pains, diarrhea, constipation. She denies numbness or tingling in her feet.   Review of Systems  Constitutional: Negative for fever, activity change, appetite change and fatigue.  HENT: Negative.   Respiratory: Negative for cough, chest tightness, shortness of breath and wheezing.   Cardiovascular: Negative for chest pain, palpitations and leg swelling.  Gastrointestinal: Negative for abdominal pain, diarrhea, constipation and abdominal distention.  Endocrine: Negative.   Genitourinary: Negative.   Musculoskeletal: Positive for arthralgias. Negative for gait problem and myalgias.  Skin: Negative for wound.  Neurological: Negative for dizziness, syncope, weakness and headaches.       Objective:   Physical Exam  Constitutional: She is oriented to person, place, and time. She appears well-developed and well-nourished.  Overweight  HENT:  Head: Normocephalic and atraumatic.  Eyes: EOM are normal.  Neck: Normal range of motion.  Cardiovascular: Normal rate and regular rhythm.   No murmur heard. Pulmonary/Chest: Effort normal and breath sounds normal. No respiratory distress. She has no wheezes.  Abdominal: Soft. Bowel sounds are normal. She exhibits no distension. There is no tenderness. There is no rebound.  Neurological: She is alert and  oriented to person, place, and time. Coordination normal.  Skin: Skin is warm and dry.   Filed Vitals:   08/13/14 1537  BP: 158/72  Pulse: 66  Temp: 98.7 F (37.1 C)  TempSrc: Oral  Resp: 22  Height: 4\' 9"  (1.448 m)  Weight: 234 lb 1.9 oz (106.196 kg)  SpO2: 98%      Assessment & Plan:

## 2014-08-14 NOTE — Assessment & Plan Note (Signed)
She does take her allopurinol and no recent flares. She does have colchicine if needed at home for flares.

## 2014-09-17 ENCOUNTER — Other Ambulatory Visit: Payer: Self-pay

## 2014-09-17 DIAGNOSIS — Z1239 Encounter for other screening for malignant neoplasm of breast: Secondary | ICD-10-CM

## 2014-09-19 ENCOUNTER — Ambulatory Visit: Payer: Medicare Other | Admitting: Internal Medicine

## 2014-09-26 ENCOUNTER — Other Ambulatory Visit: Payer: Self-pay | Admitting: Internal Medicine

## 2014-10-09 ENCOUNTER — Ambulatory Visit (INDEPENDENT_AMBULATORY_CARE_PROVIDER_SITE_OTHER): Payer: Medicare Other | Admitting: Internal Medicine

## 2014-10-09 ENCOUNTER — Encounter: Payer: Self-pay | Admitting: Internal Medicine

## 2014-10-09 VITALS — BP 148/76 | HR 61 | Temp 98.4°F | Resp 14 | Ht <= 58 in | Wt 234.8 lb

## 2014-10-09 DIAGNOSIS — M159 Polyosteoarthritis, unspecified: Secondary | ICD-10-CM

## 2014-10-09 DIAGNOSIS — M150 Primary generalized (osteo)arthritis: Secondary | ICD-10-CM

## 2014-10-09 DIAGNOSIS — M8949 Other hypertrophic osteoarthropathy, multiple sites: Secondary | ICD-10-CM

## 2014-10-09 MED ORDER — MELOXICAM 15 MG PO TABS: 15.0000 mg | ORAL_TABLET | Freq: Every day | ORAL | Status: DC

## 2014-10-09 NOTE — Progress Notes (Signed)
   Subjective:    Patient ID: Heather Grant, female    DOB: [birth year], 73 y.o.   MRN: [id number]  HPI The patient is a 73 year old female who is coming in today for left shoulder pain. She states being off and on for a while but has been getting worse last week or 2. She has been using muscle rub cream on it from the grocery store and this has been doing okay for her pain but she feels she needs something more. She denies any injury to it and feels she has reasonable full range of motion. She also is having some pain on the underneath of her bicep region where there is some hanging tissue. She states that when she supports the hanging tissue with another supporting objects she does not get pain there. She states that the pain in her shoulders never goes into her chest or down her arm.   Review of Systems  Constitutional: Negative for fever, activity change, appetite change, fatigue and unexpected weight change.  Respiratory: Negative for cough, chest tightness, shortness of breath and wheezing.   Cardiovascular: Negative for chest pain, palpitations and leg swelling.  Gastrointestinal: Negative for abdominal pain and abdominal distention.  Musculoskeletal: Positive for myalgias and arthralgias. Negative for back pain and gait problem.  Neurological: Negative for dizziness, weakness and numbness.      Objective:   Physical Exam  Constitutional: She is oriented to person, place, and time. She appears well-developed and well-nourished.  Morbidly obese  HENT:  Head: Normocephalic and atraumatic.  Eyes: EOM are normal.  Neck: Normal range of motion.  Cardiovascular: Normal rate and regular rhythm.   No murmur heard. Pulmonary/Chest: Effort normal and breath sounds normal. No respiratory distress. She has no wheezes.  Abdominal: Soft. Bowel sounds are normal. She exhibits no distension. There is no tenderness. There is no rebound.  Musculoskeletal:  Some pain on the underneath portion  of the upper arm where she has excessive dangling tissue. Decreased active range of motion left shoulder intact passive range of motion.  Neurological: She is alert and oriented to person, place, and time. Coordination normal.  Skin: Skin is warm and dry.   Filed Vitals:   10/09/14 1323  BP: 148/76  Pulse: 61  Temp: 98.4 F (36.9 C)  TempSrc: Oral  Resp: 14  Height: 4\' 9"  (1.448 m)  Weight: 234 lb 12.8 oz (106.505 kg)  SpO2: 96%      Assessment & Plan:

## 2014-10-09 NOTE — Assessment & Plan Note (Signed)
Advised patient can use both R and gel on her shoulder. We'll start Moban daily to see if this reduces her pain. Given stitching exercises for her shoulders to do to increase range of motion. Likely the pain in the area underneath her arm is due to hanging tissue pulling. Likely weight reduction would help. If no improvement will refer to sports medicine.

## 2014-10-09 NOTE — Patient Instructions (Signed)
We will have you use your voltaren gel on your shoulder as well. The other medicine we will send and is called meloxicam. Take 1 pill a day to help with arthritis pain in her shoulder and your knees.  We will give you some stretching exercises for her shoulder and if you could try and do one exercise for that shoulder every day it would help keep the muscles and tendons loose so the shoulder has less pain.  Shoulder Range of Motion Exercises The shoulder is the most flexible joint in the human body. Because of this it is also the most unstable joint in the body. All ages can develop shoulder problems. Early treatment of problems is necessary for a good outcome. People react to shoulder pain by decreasing the movement of the joint. After a brief period of time, the shoulder can become "frozen". This is an almost complete loss of the ability to move the damaged shoulder. Following injuries your caregivers can give you instructions on exercises to keep your range of motion (ability to move your shoulder freely), or regain it if it has been lost.  [place]: Codman's Exercise or Pendulum Exercise  This exercise may be performed in a prone (face-down) lying position or standing while leaning on a chair with the opposite arm. Its purpose is to relax the muscles in your shoulder and slowly but surely increase the range of motion and to relieve pain.  Lie on your stomach close to the side edge of the bed. Let your weak arm hang over the edge of the bed. Relax your shoulder, arm and hand. Let your shoulder blade relax and drop down.  Slowly and gently swing your arm forward and back. Do not use your neck muscles; relax them. It might be easier to have someone else gently start swinging your arm.  As pain decreases, increase your swing. To start, arm swing should begin at 15 degree angles. In time and as pain lessens, move to 30-45 degree angles. Start with  swinging for about 15 seconds, and work towards swinging for 3 to 5 minutes.  This exercise may also be performed in a standing/bent over position.  Stand and hold onto a sturdy chair with your good arm. Bend forward at the waist and bend your knees slightly to help protect your back. Relax your weak arm, let it hang limp. Relax your shoulder blade and let it drop.  Keep your shoulder relaxed and use body motion to swing your arm in small circles.  Stand up tall and relax.  Repeat motion and change direction of circles.  Start with swinging for about 30 seconds, and work towards swinging for 3 to 5 minutes. STRETCHING EXERCISES:  Lift your arm out in front of you with the elbow bent at 90 degrees. Using your other arm gently pull the elbow forward and across your body.  Bend one arm behind you with the palm facing outward. Using the other arm, hold a towel or rope and reach this arm up above your head, then bend it at the elbow to move your wrist to behind your neck. Grab the free end of the towel with the hand behind your back. Gently pull the towel up with the hand behind your neck, gradually increasing the pull on the hand behind the small of your back. Then, gradually pull down with the hand behind the small of your back. This will pull the hand and arm behind  your neck further. Both shoulders will have an increased range of motion with repetition of this exercise. STRENGTHENING EXERCISES:  Standing with your arm at your side and straight out from your shoulder with the elbow bent at 90 degrees, hold onto a small weight and slowly raise your hand so it points straight up in the air. Repeat this five times to begin with, and gradually increase to ten times. Do this four times per day. As you grow stronger you can gradually increase the weight.  Repeat the above exercise, only this time using an elastic band. Start with your hand up in the air and pull down until your hand is by your side. As  you grow stronger, gradually increase the amount you pull by increasing the number or size of the elastic bands. Use the same amount of repetitions.  Standing with your hand at your side and holding onto a weight, gradually lift the hand in front of you until it is over your head. Do the same also with the hand remaining at your side and lift the hand away from your body until it is again over your head. Repeat this five times to begin with, and gradually increase to ten times. Do this four times per day. As you grow stronger you can gradually increase the weight. Document Released: 08/15/2003 Document Revised: 11/21/2013 Document Reviewed: 11/16/2005 [facility] Patient Information 2015 [place], [place]. This information is not intended to replace advice given to you by your health care provider. Make sure you discuss any questions you have with your health care provider.

## 2014-10-09 NOTE — Progress Notes (Signed)
Pre visit review using our clinic review tool, if applicable. No additional management support is needed unless otherwise documented below in the visit note. 

## 2014-10-10 ENCOUNTER — Ambulatory Visit
Admission: RE | Admit: 2014-10-10 | Discharge: 2014-10-10 | Disposition: A | Discharge status: Home / Self Care | Payer: Medicare Other | Source: Ambulatory Visit

## 2014-10-10 ENCOUNTER — Other Ambulatory Visit: Payer: Self-pay

## 2014-10-10 DIAGNOSIS — Z1239 Encounter for other screening for malignant neoplasm of breast: Secondary | ICD-10-CM

## 2014-10-12 ENCOUNTER — Encounter (HOSPITAL_COMMUNITY): Payer: Self-pay | Admitting: Family Medicine

## 2014-10-12 ENCOUNTER — Telehealth: Payer: Self-pay | Admitting: Internal Medicine

## 2014-10-12 ENCOUNTER — Emergency Department (HOSPITAL_COMMUNITY)
Admission: EM | Admit: 2014-10-12 | Discharge: 2014-10-12 | Disposition: A | Discharge status: Home / Self Care | Payer: Medicare Other | Attending: Emergency Medicine | Admitting: Emergency Medicine

## 2014-10-12 DIAGNOSIS — E669 Obesity, unspecified: Secondary | ICD-10-CM | POA: Insufficient documentation

## 2014-10-12 DIAGNOSIS — Z79899 Other long term (current) drug therapy: Secondary | ICD-10-CM | POA: Insufficient documentation

## 2014-10-12 DIAGNOSIS — M179 Osteoarthritis of knee, unspecified: Secondary | ICD-10-CM | POA: Diagnosis not present

## 2014-10-12 DIAGNOSIS — Z8619 Personal history of other infectious and parasitic diseases: Secondary | ICD-10-CM | POA: Diagnosis not present

## 2014-10-12 DIAGNOSIS — Z791 Long term (current) use of non-steroidal anti-inflammatories (NSAID): Secondary | ICD-10-CM | POA: Insufficient documentation

## 2014-10-12 DIAGNOSIS — R519 Headache, unspecified: Secondary | ICD-10-CM

## 2014-10-12 DIAGNOSIS — G43909 Migraine, unspecified, not intractable, without status migrainosus: Secondary | ICD-10-CM | POA: Diagnosis not present

## 2014-10-12 DIAGNOSIS — R51 Headache: Secondary | ICD-10-CM

## 2014-10-12 DIAGNOSIS — I10 Essential (primary) hypertension: Secondary | ICD-10-CM | POA: Diagnosis not present

## 2014-10-12 DIAGNOSIS — H538 Other visual disturbances: Secondary | ICD-10-CM | POA: Diagnosis not present

## 2014-10-12 DIAGNOSIS — M109 Gout, unspecified: Secondary | ICD-10-CM | POA: Insufficient documentation

## 2014-10-12 DIAGNOSIS — G8929 Other chronic pain: Secondary | ICD-10-CM

## 2014-10-12 LAB — I-STAT CHEM 8, ED
BUN: 9 mg/dL (ref 6–23)
Calcium, Ion: 1.25 mmol/L (ref 1.13–1.30)
Chloride: 104 mEq/L (ref 96–112)
Creatinine, Ser: 1.3 mg/dL — ABNORMAL HIGH (ref 0.50–1.10)
Glucose, Bld: 95 mg/dL (ref 70–99)
HCT: 38 % (ref 36.0–46.0)
Hemoglobin: 12.9 g/dL (ref 12.0–15.0)
Potassium: 3.5 mEq/L — ABNORMAL LOW (ref 3.7–5.3)
Sodium: 143 mEq/L (ref 137–147)
TCO2: 24 mmol/L (ref 0–100)

## 2014-10-12 MED ORDER — ACETAMINOPHEN 325 MG PO TABS
650.0000 mg | ORAL_TABLET | Freq: Once | ORAL | Status: AC
  Administered 2014-10-12: 650 mg via ORAL
  Filled 2014-10-12: qty 2

## 2014-10-12 MED ORDER — POTASSIUM CHLORIDE CRYS ER 20 MEQ PO TBCR
40.0000 meq | EXTENDED_RELEASE_TABLET | Freq: Once | ORAL | Status: AC
  Administered 2014-10-12: 40 meq via ORAL
  Filled 2014-10-12: qty 2

## 2014-10-12 NOTE — ED Notes (Addendum)
Daughter comes to [facility], requesting vitals to be taken.  Pt reports "slight" headache and "a little" blurred vision.  Pt reports metoprolol dose was increased recently.

## 2014-10-12 NOTE — Telephone Encounter (Signed)
Left message for patient to call me back. 

## 2014-10-12 NOTE — Discharge Instructions (Signed)
Take Tylenol as directedfor headaches.call your primary care physician on 10/15/2014 to arrange to be seen in her office next week. Your blood pressure medication may need to be adjusted

## 2014-10-12 NOTE — ED Notes (Signed)
MD Jacobuwitz at the bedside  

## 2014-10-12 NOTE — ED Provider Notes (Signed)
CSN: [id number]     Arrival date & time 10/12/14  1148 History   None    Chief Complaint  Patient presents with  . Hypertension     (Consider location/radiation/quality/duration/timing/severity/associated sxs/prior Treatment) HPI Patient complain of headache onset approximately 8 AM today typical of headaches she's gotten for several weeks. Headache is at the top of her head gradual in onset accompanied by blurred visionin both eyes. She took her blood pressure 10:30 AM today blood pressure was 230/85.  She reports treating herself with her usual blood pressure medicines. Presently her headache is mild and her vision is almost back to normal. Symptoms accompanied by feeling "tired" since she's been here otherwise asymptomatic. Past Medical History  Diagnosis Date  . Hypertension   . Migraines   . Arthritis     hands and knees. May be hips  . Varicella   . Obesity, Class II, BMI 35-39.9   . OSA (obstructive sleep apnea)     mild by study Dec '09. AHI 5.7  . Nocturnal dyspnea     ONO 2010 with several readings  below 88%  . Gout     great toe, ankles  . Plantar fasciitis   . Hyperlipidemia    Past Surgical History  Procedure Laterality Date  . Carpal tunnel release      right  . Dilation and curettage of uterus    . G5p4      NSVD  . Cataract      right eye with IOL   Family History  Problem Relation Age of Onset  . Hypertension Mother   . Stroke Father   . Lung cancer Sister     smoked  . Asthma Son    History  Substance Use Topics  . Smoking status: Never Smoker   . Smokeless tobacco: Never Used  . Alcohol Use: Yes     Comment: rare   OB History    No data available     Review of Systems  Constitutional:       Feels tired  Eyes: Positive for visual disturbance.       Vision almost normal at present, improving spontaneously  All other systems reviewed and are negative.     Allergies  Review of patient's allergies indicates no known  allergies.  Home Medications   Prior to Admission medications   Medication Sig Start Date End Date Taking? Authorizing Provider  albuterol (VENTOLIN HFA) 108 (90 BASE) MCG/ACT inhaler Inhale 2 puffs into the lungs every 6 (six) hours as needed for wheezing. 03/27/13   Janith Lima, MD  allopurinol (ZYLOPRIM) 300 MG tablet TAKE 1 TABLET (300 MG TOTAL) BY MOUTH DAILY. 09/27/14   Olga Millers, MD  colchicine (COLCRYS) 0.6 MG tablet Take 1 tablet (0.6 mg total) by mouth as needed. For gout: take two tablets together. May take a third tablet at 2 hours if needed. May repeat the next day if needed. 02/13/14   Neena Rhymes, MD  famotidine (PEPCID) 20 MG tablet One at bedtime 07/23/14   Tanda Rockers, MD  meloxicam (MOBIC) 15 MG tablet Take 1 tablet (15 mg total) by mouth daily. 10/09/14   Olga Millers, MD  metoprolol tartrate (LOPRESSOR) 25 MG tablet Take 1 tablet (25 mg total) by mouth 2 (two) times daily. 07/25/14   Hendricks Limes, MD  mometasone-formoterol [facility]) 100-5 MCG/ACT AERO Take 2 puffs first thing in am and then another 2 puffs about 12 hours later. 07/12/13  Tanda Rockers, MD  nystatin (MYCOSTATIN/NYSTOP) 100000 UNIT/GM POWD Apply 1 g topically 2 (two) times daily as needed. For itching of crotch area    Historical Provider, MD  nystatin-triamcinolone ointment (MYCOLOG) Apply 1 application topically 2 (two) times daily as needed. For itching in crotch area    Historical Provider, MD  pantoprazole (PROTONIX) 40 MG tablet Take 1 tablet (40 mg total) by mouth daily. Take 30-60 min before first meal of the day 07/23/14   Tanda Rockers, MD  valsartan (DIOVAN) 320 MG tablet Take 320 mg by mouth daily.    Historical Provider, MD  Vitamin D, Ergocalciferol, (DRISDOL) 50000 UNITS CAPS capsule Take 50,000 Units by mouth every 7 (seven) days.  09/28/14   Historical Provider, MD  VOLTAREN 1 % GEL Apply 2 g topically 4 (four) times daily.  08/10/14   Historical Provider, MD   BP  157/64 mmHg  Pulse 47  Temp(Src) 97.9 F (36.6 C) (Oral)  Resp 18  Ht 4\' 9"  (1.448 m)  Wt 234 lb (106.142 kg)  BMI 50.62 kg/m2  SpO2 100% Physical Exam  Constitutional: She is oriented to person, place, and time. She appears well-developed and well-nourished. No distress.  HENT:  Head: Normocephalic and atraumatic.  Right Ear: External ear normal.  Left Ear: External ear normal.  Eyes: Conjunctivae are normal. Pupils are equal, round, and reactive to light.  Optic discs sharp bilaterally  Neck: Neck supple. No tracheal deviation present. No thyromegaly present.  No bruit  Cardiovascular: Normal rate and regular rhythm.   No murmur heard. Pulmonary/Chest: Effort normal and breath sounds normal.  Abdominal: Soft. Bowel sounds are normal. She exhibits no distension. There is no tenderness.  obese  Musculoskeletal: Normal range of motion. She exhibits no edema or tenderness.  Neurological: She is alert and oriented to person, place, and time. No cranial nerve deficit. Coordination normal.  Gait normal Romberg normal pronator drift normal finger to nose normal. DTR symmetric bilaterally knee jerk ankle jerk biceps disorder bilaterally  Skin: Skin is warm and dry. No rash noted.  Psychiatric: She has a normal mood and affect.  Nursing note and vitals reviewed.   ED Course  Procedures (including critical care time) Labs Review Labs Reviewed - No data to display  Imaging Review Mm Digital Screening Bilateral  10/11/2014   CLINICAL DATA:  Screening.  EXAM: DIGITAL SCREENING BILATERAL MAMMOGRAM WITH CAD  COMPARISON:  Previous exam(s)  ACR Breast Density Category a: The breast tissue is almost entirely fatty.  FINDINGS: There has been no significant interval change and there are no findings suspicious for malignancy. Images were processed with CAD.  IMPRESSION: No mammographic evidence of malignancy. A result letter of this screening mammogram will be mailed directly to the patient.   RECOMMENDATION: Screening mammogram in one year. (Code:SM-B-01Y)  BI-RADS CATEGORY  1: Negative.   Electronically Signed   By: Andres Shad   On: 10/11/2014 11:23     EKG Interpretation   Date/Time:  Friday October 12 2014 16:00:33 EST Ventricular Rate:  47 PR Interval:  139 QRS Duration: 146 QT Interval:  493 QTC Calculation: 436 R Axis:   73 Text Interpretation:  Sinus bradycardia Right bundle branch block Baseline  wander in lead(s) II aVF Since last tracing rate slower Confirmed by  Winfred Leeds  MD, Samentha Perham [phone]) on 10/12/2014 5:31:51 PM     5:30 PM patient asymptomatic alert and and laboratory after treatment with Tylenol. Results for orders placed or performed during the  hospital encounter of 10/12/14  I-stat chem 8, ed  Result Value Ref Range   Sodium 143 137 - 147 mEq/L   Potassium 3.5 (L) 3.7 - 5.3 mEq/L   Chloride 104 96 - 112 mEq/L   BUN 9 6 - 23 mg/dL   Creatinine, Ser 1.30 (H) 0.50 - 1.10 mg/dL   Glucose, Bld 95 70 - 99 mg/dL   Calcium, Ion 1.25 1.13 - 1.30 mmol/L   TCO2 24 0 - 100 mmol/L   Hemoglobin 12.9 12.0 - 15.0 g/dL   HCT 38.0 36.0 - 46.0 %   Mm Digital Screening Bilateral  10/11/2014   CLINICAL DATA:  Screening.  EXAM: DIGITAL SCREENING BILATERAL MAMMOGRAM WITH CAD  COMPARISON:  Previous exam(s)  ACR Breast Density Category a: The breast tissue is almost entirely fatty.  FINDINGS: There has been no significant interval change and there are no findings suspicious for malignancy. Images were processed with CAD.  IMPRESSION: No mammographic evidence of malignancy. A result letter of this screening mammogram will be mailed directly to the patient.  RECOMMENDATION: Screening mammogram in one year. (Code:SM-B-01Y)  BI-RADS CATEGORY  1: Negative.   Electronically Signed   By: Andres Shad   On: 10/11/2014 11:23    MDM  Plan follow-up with her care physician next week. No signs of end organ damage Final diagnoses:  None   Diagnosis #1 nonspecific headache #2  hypokalemia #3 hypertension     Orlie Dakin, MD 10/12/14 1739

## 2014-10-12 NOTE — ED Notes (Signed)
Pt sts BP has been elevated over the past week. sts some blurry vision and slight HA.

## 2014-10-12 NOTE — Telephone Encounter (Signed)
Pt called in and said that blood pressure is high and needs to know what to do.  Sh is requesting a return call from nurse

## 2014-10-15 NOTE — Telephone Encounter (Signed)
Patient aware and appointment has been scheduled for 10/16/2014.

## 2014-10-15 NOTE — Telephone Encounter (Signed)
Patient needs to be seen for an acute visit.

## 2014-10-15 NOTE — Telephone Encounter (Signed)
I spoke with patient and she said she went to the emergency room on Friday. Her pressure was in the 230's. She said the doctor at the emergency room told her that her PCP needed to change her blood pressure medication. She said her pressure goes up every time she walks. Do you want me to have her come back in, or do you want to make changes over the phone? Please advise thanks.

## 2014-10-15 NOTE — Telephone Encounter (Signed)
Pt called back in, she was requesting a call back today.  Pt is not feeling well

## 2014-10-16 ENCOUNTER — Encounter: Payer: Self-pay | Admitting: Internal Medicine

## 2014-10-16 ENCOUNTER — Ambulatory Visit (INDEPENDENT_AMBULATORY_CARE_PROVIDER_SITE_OTHER): Payer: Medicare Other | Admitting: Internal Medicine

## 2014-10-16 ENCOUNTER — Other Ambulatory Visit (INDEPENDENT_AMBULATORY_CARE_PROVIDER_SITE_OTHER): Payer: Medicare Other

## 2014-10-16 VITALS — BP 150/78 | HR 57 | Temp 97.6°F | Wt 236.2 lb

## 2014-10-16 DIAGNOSIS — Z8639 Personal history of other endocrine, nutritional and metabolic disease: Secondary | ICD-10-CM

## 2014-10-16 DIAGNOSIS — R7989 Other specified abnormal findings of blood chemistry: Secondary | ICD-10-CM

## 2014-10-16 DIAGNOSIS — R748 Abnormal levels of other serum enzymes: Secondary | ICD-10-CM

## 2014-10-16 DIAGNOSIS — R51 Headache: Secondary | ICD-10-CM

## 2014-10-16 DIAGNOSIS — Z8739 Personal history of other diseases of the musculoskeletal system and connective tissue: Secondary | ICD-10-CM

## 2014-10-16 DIAGNOSIS — R519 Headache, unspecified: Secondary | ICD-10-CM

## 2014-10-16 DIAGNOSIS — I10 Essential (primary) hypertension: Secondary | ICD-10-CM

## 2014-10-16 MED ORDER — HYDRALAZINE HCL 25 MG PO TABS
25.0000 mg | ORAL_TABLET | Freq: Three times a day (TID) | ORAL | Status: DC
Start: 2014-10-16 — End: 2014-11-16

## 2014-10-16 NOTE — Progress Notes (Signed)
Pre visit review using our clinic review tool, if applicable. No additional management support is needed unless otherwise documented below in the visit note. 

## 2014-10-16 NOTE — Progress Notes (Signed)
   Subjective:    Patient ID: Heather Grant, female    DOB: 1941/01/10, 73 y.o.   MRN: [id number]  HPI    She was seen in emergency room 10/12/14 with uncontrolled blood pressure. She had documented blood pressure at home of 230/85 with a wrist cuff. This was associated with frontal headaches and blurred vision. That ER note was reviewed.  Labs included a potassium of 3.5 and creatinine 1.3. EKG revealed sinus bradycardia.  Based on a telephone call from this office she had increased her Metroprolol from 25 mg twice a day to 2 pills twice a day.   She is on colchichine & allopurinol for history of gout   Review of Systems   Since the ER visit blood pressures were noted to be 113-115 over possibly 70. Again this with a wrist cuff ,not an arm cuff  She's continued to have her frontal headaches and feels it may be related to the metoprolol  She has chronic exertional dyspnea but this is actually improved. She describes edema.       Objective:   Physical Exam   Positive or pertinent findings include: Based on CDC criteria obesity is present She has a markedly slow heart rate Pedal pulses are decreased  General appearance :adequately nourished; in no distress. Eyes: No conjunctival inflammation or scleral icterus is present. Oral exam: Dental hygiene is good. Lips and gums are healthy appearing.There is no oropharyngeal erythema or exudate noted.  Heart:  Normal rate and regular rhythm. S1 and S2 normal without gallop, murmur, click, rub or other extra sounds   Lungs:Chest clear to auscultation; no wheezes, rhonchi,rales ,or rubs present.No increased work of breathing.  Abdomen:Protuberant; bowel sounds normal, soft and non-tender without masses, organomegaly or hernias noted.  No guarding or rebound.  Vascular : all pulses equal ; no bruits present. Skin:Warm & dry.  Intact without suspicious lesions or rashes ; no  tenting Lymphatic: No lymphadenopathy is noted about the  head, neck, axilla             Assessment & Plan:  #1 accelerated hypertension  #2 morning headaches, most likely related to blood pressure spikes 2 hours prior to arising. She questions relationship to metoprolol but the headache should be occurring during the day after she takes the doses  #2 history gout; HCTZ contraindicated. Additionally her potassium was only 3.5  #3 elevated creatinine; this states that spironolactone is not an option. The allopurinol dose should be reduced if possible.  #4 bradycardia; metoprolol will be decreased to 25 mg bid   Plan: Hydralazine 25 mg 3 times a day will be added;she will follow-up in 2 weeks with her primary care physician. She will keep a diary of her headaches and bring a log of blood pressures. An arm cuff was recommended.

## 2014-10-16 NOTE — Patient Instructions (Signed)
Please keep a diary of your headaches . Document  each occurrence on the calendar with notation of : #1 any prodrome ( any non headache symptom such as marked fatigue,visual changes, ,etc ) which precedes actual headache ; #2) severity on 1-10 scale; #3) any triggers ( food/ drink,enviromenntal or weather changes ,physical or emotional stress) in 8-12 hour period prior to the headache; & #4) response to any medications or other intervention. Please review "Headache" @ [facility] for additional information.    Minimal Blood Pressure Goal= AVERAGE < 140/90;  Ideal is an AVERAGE < 135/85. This AVERAGE should be calculated from @ least 5-7 BP readings taken @ different times of day on different days of week. You should not respond to isolated BP readings , but rather the AVERAGE for that week .Please bring your  blood pressure cuff to office visits to verify that it is reliable.It  can also be checked against the blood pressure device at the pharmacy. Finger or wrist cuffs are not dependable; an arm cuff is.

## 2014-10-17 ENCOUNTER — Telehealth: Payer: Self-pay | Admitting: *Deleted

## 2014-10-17 LAB — URIC ACID: Uric Acid, Serum: 8.1 mg/dL — ABNORMAL HIGH (ref 2.4–7.0)

## 2014-10-17 MED ORDER — BLOOD PRESSURE MONITOR DEVI: Status: DC

## 2014-10-17 NOTE — Telephone Encounter (Signed)
Notified pt rx ready for pick-up.../lmb 

## 2014-10-17 NOTE — Telephone Encounter (Signed)
Left msg on triage stating wanting to come and pick up rx for blood pressure cuff...Heather Grant

## 2014-10-23 ENCOUNTER — Telehealth: Payer: Self-pay | Admitting: Internal Medicine

## 2014-10-24 ENCOUNTER — Other Ambulatory Visit: Payer: Self-pay | Admitting: Internal Medicine

## 2014-10-29 ENCOUNTER — Other Ambulatory Visit: Payer: Self-pay | Admitting: Internal Medicine

## 2014-10-29 ENCOUNTER — Other Ambulatory Visit: Payer: Self-pay | Admitting: Geriatric Medicine

## 2014-10-29 MED ORDER — VALSARTAN 320 MG PO TABS: 320.0000 mg | ORAL_TABLET | Freq: Every day | ORAL | Status: DC

## 2014-10-29 NOTE — Telephone Encounter (Signed)
Pt called in and wants a refill on valsartan (DIOVAN) 320 MG tablet [[id number]] , She said pharmacy faxed over request.

## 2014-10-29 NOTE — Telephone Encounter (Signed)
Sent to pharmacy 

## 2014-11-02 ENCOUNTER — Ambulatory Visit (INDEPENDENT_AMBULATORY_CARE_PROVIDER_SITE_OTHER): Payer: Medicare Other | Admitting: Internal Medicine

## 2014-11-02 ENCOUNTER — Encounter: Payer: Self-pay | Admitting: Internal Medicine

## 2014-11-02 VITALS — BP 134/72 | HR 66 | Temp 98.2°F | Resp 18 | Ht <= 58 in | Wt 235.2 lb

## 2014-11-02 DIAGNOSIS — I10 Essential (primary) hypertension: Secondary | ICD-10-CM

## 2014-11-02 NOTE — Progress Notes (Signed)
Pre visit review using our clinic review tool, if applicable. No additional management support is needed unless otherwise documented below in the visit note. 

## 2014-11-02 NOTE — Progress Notes (Signed)
   Subjective:    Patient ID: Heather Grant, female    DOB: Aug 16, 1941, 73 y.o.   MRN: [id number]  HPI The patient is a 73 year old female who comes in today follow-up on her blood pressure. She also notes that she is having some pain in the MCP joint of her left thumb. She is getting some shooting nerve pains. Especially bad when she overuses it. She denies any headaches, she is doing well with her new medication hydralazine. She is denying chest pain, shortness breath, abdominal pain.  Review of Systems  Constitutional: Negative for fever, activity change, appetite change, fatigue and unexpected weight change.  Respiratory: Negative for cough, chest tightness, shortness of breath and wheezing.   Cardiovascular: Negative for chest pain, palpitations and leg swelling.  Gastrointestinal: Negative for abdominal pain and abdominal distention.  Musculoskeletal: Positive for myalgias and arthralgias. Negative for back pain and gait problem.  Neurological: Negative for dizziness, weakness and numbness.      Objective:   Physical Exam  Constitutional: She is oriented to person, place, and time. She appears well-developed and well-nourished.  Morbidly obese  HENT:  Head: Normocephalic and atraumatic.  Eyes: EOM are normal.  Neck: Normal range of motion.  Cardiovascular: Normal rate and regular rhythm.   No murmur heard. Pulmonary/Chest: Effort normal and breath sounds normal. No respiratory distress. She has no wheezes.  Abdominal: Soft. Bowel sounds are normal. She exhibits no distension. There is no tenderness. There is no rebound.  Musculoskeletal:  Pain around the MCP joint of the left thumb.  Neurological: She is alert and oriented to person, place, and time. Coordination normal.  Skin: Skin is warm and dry.   Filed Vitals:   11/02/14 0949  BP: 134/72  Pulse: 66  Temp: 98.2 F (36.8 C)  TempSrc: Oral  Resp: 18  Height: 4\' 9"  (1.448 m)  Weight: 235 lb 3.2 oz (106.686 kg)    SpO2: 96%      Assessment & Plan:

## 2014-11-02 NOTE — Patient Instructions (Signed)
Your blood pressure is doing great today. We will see you back in about 3-4 months to check on how you're doing.  If you have any new problems or questions before then please feel free to call our office.  Keep working on doing the exercises for your shoulder.

## 2014-11-02 NOTE — Assessment & Plan Note (Signed)
She is currently taking metoprolol, ARB, hydralazine. Blood pressure under much better control than at last visit.

## 2014-11-10 ENCOUNTER — Other Ambulatory Visit: Payer: Self-pay | Admitting: Internal Medicine

## 2014-11-12 ENCOUNTER — Other Ambulatory Visit: Payer: Self-pay | Admitting: Internal Medicine

## 2014-11-16 ENCOUNTER — Telehealth: Payer: Self-pay | Admitting: Internal Medicine

## 2014-11-16 ENCOUNTER — Other Ambulatory Visit: Payer: Self-pay | Admitting: Geriatric Medicine

## 2014-11-16 DIAGNOSIS — I10 Essential (primary) hypertension: Secondary | ICD-10-CM

## 2014-11-16 MED ORDER — HYDRALAZINE HCL 25 MG PO TABS: 25.0000 mg | ORAL_TABLET | Freq: Three times a day (TID) | ORAL | Status: DC

## 2014-11-16 NOTE — Telephone Encounter (Signed)
Pt states he needs med refilled, pharmacy has sent refill: hydrALAZINE (APRESOLINE) 25 MG tablet

## 2014-12-01 ENCOUNTER — Other Ambulatory Visit: Payer: Self-pay | Admitting: Internal Medicine

## 2014-12-02 DIAGNOSIS — J45998 Other asthma: Secondary | ICD-10-CM | POA: Diagnosis not present

## 2014-12-02 DIAGNOSIS — R0602 Shortness of breath: Secondary | ICD-10-CM | POA: Diagnosis not present

## 2014-12-03 DIAGNOSIS — M25561 Pain in right knee: Secondary | ICD-10-CM | POA: Diagnosis not present

## 2014-12-03 DIAGNOSIS — M7542 Impingement syndrome of left shoulder: Secondary | ICD-10-CM | POA: Diagnosis not present

## 2014-12-03 DIAGNOSIS — M25512 Pain in left shoulder: Secondary | ICD-10-CM | POA: Diagnosis not present

## 2014-12-03 DIAGNOSIS — M25562 Pain in left knee: Secondary | ICD-10-CM | POA: Diagnosis not present

## 2014-12-06 ENCOUNTER — Encounter: Payer: Self-pay | Admitting: Internal Medicine

## 2014-12-06 ENCOUNTER — Ambulatory Visit (INDEPENDENT_AMBULATORY_CARE_PROVIDER_SITE_OTHER): Payer: Medicare Other | Admitting: Internal Medicine

## 2014-12-06 VITALS — BP 132/72 | HR 52 | Temp 98.3°F | Ht <= 58 in | Wt 235.2 lb

## 2014-12-06 DIAGNOSIS — J4520 Mild intermittent asthma, uncomplicated: Secondary | ICD-10-CM | POA: Diagnosis not present

## 2014-12-06 DIAGNOSIS — J310 Chronic rhinitis: Secondary | ICD-10-CM

## 2014-12-06 MED ORDER — MOMETASONE FURO-FORMOTEROL FUM 100-5 MCG/ACT IN AERO: INHALATION_SPRAY | RESPIRATORY_TRACT | Status: DC

## 2014-12-06 NOTE — Progress Notes (Signed)
Subjective:    Patient ID: Heather Grant, female    DOB: 26-Nov-1941    MRN: [id number]   Brief patient profile:  36 yobf never smoker with ? New onset asthma in Dec 2013 referred 05/31/2013 to pulmonary clinic by Dr Linda Hedges - did have one bad sinus infection age 74 but no resp problems subsequently or need for any type of resp/ allergy med in interim.     History of Present Illness  05/31/2013 1st pulmonary eval cc acute onset cough Dec 2013   treated in ER but   but ever since then cc daily symptoms of coughing day > night assoc with sob and worse when move around and productive of white mucus and feels like something stuck in throat. Some better from inhalers despite very poor hfa, no better with last prednisone course. rec Pantoprazole (protonix) 40 mg   Take 30-60 min before first meal of the day and Pepcid 20 mg one bedtime until return to office - this is the best way to tell whether stomach acid is contributing to your problem.  GERD diet  dulera 100 2puffs every 12 hours if needed for cough or breathing  07/12/2013 f/u ov/Heather Grant  Chief Complaint  Patient presents with  . Follow-up    Pt reports DOE unchanged, but her cough is doing much better. She denies any new co's today.     occ uses albuterol if sitting out side in heat ? Less while on dulera (not clear this is so) but overall better on the 100 dulera than the 200 and ran out of both 5 days prior to OV  s flare of day or noct cough or sob rec Stay on the protonix(pantoprazole) and pepcid(famotidine)  and see if coughs stays gone If you need the albuterol more than a couple times a week you need to be back on dulera 100 2 every 12 hours  Add chlortrimeton 4 mg at bedtime(over the counter) the see if the morning cough is better > resolved     12/06/2014 f/u ov/Heather Grant re: asthma on duler / using 02 2lpm hs on dulera 100 2bid  Chief Complaint  Patient presents with  . Follow-up    sob-same, no cough.Doing good.Needs refills   proair sev days per week, not typically at night just if going in and out of buildings   No obvious daytime variabilty or assoc cough or   cp or chest tightness, subjective wheeze overt  hb symptoms. No unusual exp hx or h/o childhood pna/ asthma or knowledge of premature birth.   Sleeping ok without nocturnal  or early am exacerbation  of respiratory  c/o's or need for noct saba. Also denies any obvious fluctuation of symptoms with weather or environmental changes or other aggravating or alleviating factors except as outlined above   Current Medications, Allergies, Past Medical History, Past Surgical History, Family History, and Social History were reviewed in [employer] record.  ROS  The following are not active complaints unless bolded sore throat, dysphagia, dental problems, itching, sneezing,  nasal congestion or excess/ purulent secretions, ear ache,   fever, chills, sweats, unintended wt loss, pleuritic or exertional cp, hemoptysis,  orthopnea pnd or leg swelling, presyncope, palpitations, heartburn, abdominal pain, anorexia, nausea, vomiting, diarrhea  or change in bowel or urinary habits, change in stools or urine, dysuria,hematuria,  rash, arthralgias, visual complaints, headache, numbness weakness or ataxia or problems with walking or coordination,  change in mood/affect or memory.  Objective:   Physical Exam   Obese bf nad    07/12/2013        262  > 03/15/2014 234 >  12/06/2014 235  Wt Readings from Last 3 Encounters:  05/31/13 263 lb (119.296 kg)  05/01/13 263 lb 6.4 oz (119.477 kg)  03/27/13 266 lb 12 oz (120.997 kg)     HEENT: nl dentition,  and orophanx. Nl external ear canals without cough reflex Moderate non-specific turbinate edema bilaterally    NECK :  without JVD/Nodes/TM/ nl carotid upstrokes bilaterally   LUNGS: no acc muscle use, clear to A and P bilaterally without cough on insp or exp maneuvers   CV:  RRR  no s3 or  murmur or increase in P2, no edema   ABD:  soft and nontender with nl excursion in the supine position. No bruits or organomegaly, bowel sounds nl  MS:  warm without deformities, calf tenderness, cyanosis or clubbing  SKIN: warm and dry without lesions    NEURO:  alert, approp, no deficits     cxr 03/27/13  No active cardiopulmonary disease.     Assessment & Plan:

## 2014-12-06 NOTE — Patient Instructions (Signed)
dulera 100 Take 2 puffs first thing in am and then another 2 puffs about 12 hours later and if doing great leave the pm dose  Only use your albuterol (proair) as a rescue medication to be used if you can't catch your breath by resting or doing a relaxed purse lip breathing pattern.  - The less you use it, the better it will work when you need it. - Ok to use up to 2 puffs  every 4 hours if you must but call for immediate appointment if use goes up over your usual need - Don't leave home without it !!  (think of it like the spare tire for your car)    If you are satisfied with your treatment plan,  let your doctor know and he/she can either refill your medications or you can return here when your prescription runs out.     If in any way you are not 100% satisfied,  please tell us.  If 100% better, tell your friends!  Pulmonary follow up is as needed

## 2014-12-09 ENCOUNTER — Encounter: Payer: Self-pay | Admitting: Internal Medicine

## 2014-12-09 NOTE — Assessment & Plan Note (Signed)
-  PFT's 07/12/2013 wnl p last saba/laba dose > 5 days prior    All goals of chronic asthma control met including optimal function and elimination of symptoms with minimal need for rescue therapy.  Contingencies discussed in full including contacting this office immediately if not controlling the symptoms using the rule of two's.     The proper method of use, as well as anticipated side effects, of a metered-dose inhaler are discussed and demonstrated to the patient. Improved effectiveness after extensive coaching during this visit to a level of approximately  90% so could try to reduce her dulera to just 100 2 qam in the absene of an obvious flare or noct symptoms    Each maintenance medication was reviewed in detail including most importantly the difference between maintenance and as needed and under what circumstances the prns are to be used.  Please see instructions for details which were reviewed in writing and the patient given a copy.

## 2014-12-09 NOTE — Assessment & Plan Note (Signed)
Adequate control on 1st gen H1, reviewed  Pulmonary f/u can be prn as has primary care following her now

## 2014-12-12 ENCOUNTER — Other Ambulatory Visit: Payer: Self-pay | Admitting: Internal Medicine

## 2014-12-14 ENCOUNTER — Other Ambulatory Visit: Payer: Self-pay | Admitting: Internal Medicine

## 2014-12-14 DIAGNOSIS — Z961 Presence of intraocular lens: Secondary | ICD-10-CM | POA: Diagnosis not present

## 2014-12-14 DIAGNOSIS — H04123 Dry eye syndrome of bilateral lacrimal glands: Secondary | ICD-10-CM | POA: Diagnosis not present

## 2014-12-14 DIAGNOSIS — H52203 Unspecified astigmatism, bilateral: Secondary | ICD-10-CM | POA: Diagnosis not present

## 2014-12-14 DIAGNOSIS — H5213 Myopia, bilateral: Secondary | ICD-10-CM | POA: Diagnosis not present

## 2014-12-17 ENCOUNTER — Other Ambulatory Visit: Payer: Self-pay | Admitting: Geriatric Medicine

## 2014-12-17 MED ORDER — COLCHICINE 0.6 MG PO TABS: 0.6000 mg | ORAL_TABLET | ORAL | Status: DC | PRN

## 2014-12-23 ENCOUNTER — Other Ambulatory Visit: Payer: Self-pay | Admitting: Internal Medicine

## 2014-12-26 ENCOUNTER — Other Ambulatory Visit: Payer: Self-pay | Admitting: Internal Medicine

## 2015-01-02 DIAGNOSIS — J45998 Other asthma: Secondary | ICD-10-CM | POA: Diagnosis not present

## 2015-01-02 DIAGNOSIS — R0602 Shortness of breath: Secondary | ICD-10-CM | POA: Diagnosis not present

## 2015-01-08 ENCOUNTER — Telehealth: Payer: Self-pay | Admitting: Internal Medicine

## 2015-01-08 ENCOUNTER — Other Ambulatory Visit: Payer: Self-pay | Admitting: Internal Medicine

## 2015-01-28 MED ORDER — ALLOPURINOL 300 MG PO TABS: 300.0000 mg | ORAL_TABLET | Freq: Every day | ORAL | Status: DC

## 2015-01-28 NOTE — Telephone Encounter (Signed)
erx done

## 2015-01-28 NOTE — Telephone Encounter (Signed)
Pharmacy called in looking for refill for allopurinol (ZYLOPRIM) 300 MG tablet [[id number]]  They stated they have requested it a few times

## 2015-01-31 DIAGNOSIS — J45998 Other asthma: Secondary | ICD-10-CM | POA: Diagnosis not present

## 2015-01-31 DIAGNOSIS — R0602 Shortness of breath: Secondary | ICD-10-CM | POA: Diagnosis not present

## 2015-02-03 ENCOUNTER — Other Ambulatory Visit: Payer: Self-pay | Admitting: Internal Medicine

## 2015-02-11 ENCOUNTER — Other Ambulatory Visit (INDEPENDENT_AMBULATORY_CARE_PROVIDER_SITE_OTHER): Payer: Medicare Other

## 2015-02-11 ENCOUNTER — Encounter: Payer: Self-pay | Admitting: Internal Medicine

## 2015-02-11 ENCOUNTER — Ambulatory Visit (INDEPENDENT_AMBULATORY_CARE_PROVIDER_SITE_OTHER): Payer: Medicare Other | Admitting: Internal Medicine

## 2015-02-11 ENCOUNTER — Ambulatory Visit: Payer: Medicare Other | Admitting: Internal Medicine

## 2015-02-11 VITALS — BP 136/68 | HR 65 | Temp 97.7°F | Resp 18 | Ht 59.0 in | Wt 234.0 lb

## 2015-02-11 DIAGNOSIS — E785 Hyperlipidemia, unspecified: Secondary | ICD-10-CM | POA: Diagnosis not present

## 2015-02-11 DIAGNOSIS — M159 Polyosteoarthritis, unspecified: Secondary | ICD-10-CM

## 2015-02-11 DIAGNOSIS — M150 Primary generalized (osteo)arthritis: Secondary | ICD-10-CM

## 2015-02-11 DIAGNOSIS — Z23 Encounter for immunization: Secondary | ICD-10-CM

## 2015-02-11 DIAGNOSIS — I10 Essential (primary) hypertension: Secondary | ICD-10-CM

## 2015-02-11 DIAGNOSIS — Z0000 Encounter for general adult medical examination without abnormal findings: Secondary | ICD-10-CM

## 2015-02-11 DIAGNOSIS — M1A0710 Idiopathic chronic gout, right ankle and foot, without tophus (tophi): Secondary | ICD-10-CM

## 2015-02-11 DIAGNOSIS — M8949 Other hypertrophic osteoarthropathy, multiple sites: Secondary | ICD-10-CM

## 2015-02-11 LAB — LIPID PANEL
Cholesterol: 236 mg/dL — ABNORMAL HIGH (ref 0–200)
HDL: 49.2 mg/dL (ref >39.00)
LDL Cholesterol: 158 mg/dL — ABNORMAL HIGH (ref 0–99)
NonHDL: 186.8
Total CHOL/HDL Ratio: 5
Triglycerides: 145 mg/dL (ref 0.0–149.0)
VLDL: 29 mg/dL (ref 0.0–40.0)

## 2015-02-11 LAB — COMPREHENSIVE METABOLIC PANEL
ALT: 11 U/L (ref 0–35)
AST: 15 U/L (ref 0–37)
Albumin: 4.3 g/dL (ref 3.5–5.2)
Alkaline Phosphatase: 66 U/L (ref 39–117)
BUN: 18 mg/dL (ref 6–23)
CO2: 29 mEq/L (ref 19–32)
Calcium: 10.1 mg/dL (ref 8.4–10.5)
Chloride: 106 mEq/L (ref 96–112)
Creatinine, Ser: 1.37 mg/dL — ABNORMAL HIGH (ref 0.40–1.20)
GFR: 48.74 mL/min — ABNORMAL LOW (ref >60.00)
Glucose, Bld: 99 mg/dL (ref 70–99)
Potassium: 4.1 mEq/L (ref 3.5–5.1)
Sodium: 141 mEq/L (ref 135–145)
Total Bilirubin: 0.5 mg/dL (ref 0.2–1.2)
Total Protein: 6.9 g/dL (ref 6.0–8.3)

## 2015-02-11 LAB — URIC ACID: Uric Acid, Serum: 8.1 mg/dL — ABNORMAL HIGH (ref 2.4–7.0)

## 2015-02-11 MED ORDER — HYDROCODONE-ACETAMINOPHEN 5-325 MG PO TABS: 1.0000 | ORAL_TABLET | Freq: Every day | ORAL | Status: DC | PRN

## 2015-02-11 MED ORDER — COLCHICINE 0.6 MG PO TABS: 0.6000 mg | ORAL_TABLET | Freq: Every day | ORAL | Status: DC

## 2015-02-11 NOTE — Assessment & Plan Note (Signed)
Talked with her today about losing weight for her joints. She knows this is the only way to take pressure off the joints. She is not optimistic about this. Check lipid panel.

## 2015-02-11 NOTE — Assessment & Plan Note (Signed)
Colonoscopy due this year (she is doing hemoccult cards at home yearly instead, last done 8/15). Given prevnar to complete pneumonia series. Flu shot declines. Mammogram due in November and normal. Given schedule of screening for next 10 years. Talked to her about bone density and she is not sure if she has had one or not.

## 2015-02-11 NOTE — Progress Notes (Signed)
Pre visit review using our clinic review tool, if applicable. No additional management support is needed unless otherwise documented below in the visit note. 

## 2015-02-11 NOTE — Assessment & Plan Note (Addendum)
BP controlled, check labs today. Continue meds as per list.

## 2015-02-11 NOTE — Patient Instructions (Signed)
We have given you the pneumonia booster to help protect you against pneumonia.  We have refilled the pain medicine for the shoulder. We will check the blood work today to make sure you are doing well.  We are not changing the medicines today and will call back about the lab results.   Work on exercising at least 3-4 times per week to keep yourself and your heart healthy.   Health Maintenance Adopting a healthy lifestyle and getting preventive care can go a long way to promote health and wellness. Talk with your health care provider about what schedule of regular examinations is right for you. This is a good chance for you to check in with your provider about disease prevention and staying healthy. In between checkups, there are plenty of things you can do on your own. Experts have done a lot of research about which lifestyle changes and preventive measures are most likely to keep you healthy. Ask your health care provider for more information. WEIGHT AND DIET  Eat a healthy diet  Be sure to include plenty of vegetables, fruits, low-fat dairy products, and lean protein.  Do not eat a lot of foods high in solid fats, added sugars, or salt.  Get regular exercise. This is one of the most important things you can do for your health.  Most adults should exercise for at least 150 minutes each week. The exercise should increase your heart rate and make you sweat (moderate-intensity exercise).  Most adults should also do strengthening exercises at least twice a week. This is in addition to the moderate-intensity exercise.  Maintain a healthy weight  Body mass index (BMI) is a measurement that can be used to identify possible weight problems. It estimates body fat based on height and weight. Your health care provider can help determine your BMI and help you achieve or maintain a healthy weight.  For females 72 years of age and older:   A BMI below 18.5 is considered underweight.  A BMI of 18.5  to 24.9 is normal.  A BMI of 25 to 29.9 is considered overweight.  A BMI of 30 and above is considered obese.  Watch levels of cholesterol and blood lipids  You should start having your blood tested for lipids and cholesterol at 72 years of age, then have this test every 5 years.  You may need to have your cholesterol levels checked more often if:  Your lipid or cholesterol levels are high.  You are older than 72 years of age.  You are at high risk for heart disease.  CANCER SCREENING   Lung Cancer  Lung cancer screening is recommended for adults 53-70 years old who are at high risk for lung cancer because of a history of smoking.  A yearly low-dose CT scan of the lungs is recommended for people who:  Currently smoke.  Have quit within the past 15 years.  Have at least a 30-pack-year history of smoking. A pack year is smoking an average of one pack of cigarettes a day for 1 year.  Yearly screening should continue until it has been 15 years since you quit.  Yearly screening should stop if you develop a health problem that would prevent you from having lung cancer treatment.  Breast Cancer  Practice breast self-awareness. This means understanding how your breasts normally appear and feel.  It also means doing regular breast self-exams. Let your health care provider know about any changes, no matter how small.  If you  are in your 20s or 30s, you should have a clinical breast exam (CBE) by a health care provider every 1-3 years as part of a regular health exam.  If you are 40 or older, have a CBE every year. Also consider having a breast X-ray (mammogram) every year.  If you have a family history of breast cancer, talk to your health care provider about genetic screening.  If you are at high risk for breast cancer, talk to your health care provider about having an MRI and a mammogram every year.  Breast cancer gene (BRCA) assessment is recommended for women who have  family members with BRCA-related cancers. BRCA-related cancers include:  Breast.  Ovarian.  Tubal.  Peritoneal cancers.  Results of the assessment will determine the need for genetic counseling and BRCA1 and BRCA2 testing. Cervical Cancer Routine pelvic examinations to screen for cervical cancer are no longer recommended for nonpregnant women who are considered low risk for cancer of the pelvic organs (ovaries, uterus, and vagina) and who do not have symptoms. A pelvic examination may be necessary if you have symptoms including those associated with pelvic infections. Ask your health care provider if a screening pelvic exam is right for you.   The Pap test is the screening test for cervical cancer for women who are considered at risk.  If you had a hysterectomy for a problem that was not cancer or a condition that could lead to cancer, then you no longer need Pap tests.  If you are older than 65 years, and you have had normal Pap tests for the past 10 years, you no longer need to have Pap tests.  If you have had past treatment for cervical cancer or a condition that could lead to cancer, you need Pap tests and screening for cancer for at least 20 years after your treatment.  If you no longer get a Pap test, assess your risk factors if they change (such as having a new sexual partner). This can affect whether you should start being screened again.  Some women have medical problems that increase their chance of getting cervical cancer. If this is the case for you, your health care provider may recommend more frequent screening and Pap tests.  The human papillomavirus (HPV) test is another test that may be used for cervical cancer screening. The HPV test looks for the virus that can cause cell changes in the cervix. The cells collected during the Pap test can be tested for HPV.  The HPV test can be used to screen women 30 years of age and older. Getting tested for HPV can extend the interval  between normal Pap tests from three to five years.  An HPV test also should be used to screen women of any age who have unclear Pap test results.  After 72 years of age, women should have HPV testing as often as Pap tests.  Colorectal Cancer  This type of cancer can be detected and often prevented.  Routine colorectal cancer screening usually begins at 72 years of age and continues through 72 years of age.  Your health care provider may recommend screening at an earlier age if you have risk factors for colon cancer.  Your health care provider may also recommend using home test kits to check for hidden blood in the stool.  A small camera at the end of a tube can be used to examine your colon directly (sigmoidoscopy or colonoscopy). This is done to check for the earliest   forms of colorectal cancer.  Routine screening usually begins at age 28.  Direct examination of the colon should be repeated every 5-10 years through 72 years of age. However, you may need to be screened more often if early forms of precancerous polyps or small growths are found. Skin Cancer  Check your skin from head to toe regularly.  Tell your health care provider about any new moles or changes in moles, especially if there is a change in a mole's shape or color.  Also tell your health care provider if you have a mole that is larger than the size of a pencil eraser.  Always use sunscreen. Apply sunscreen liberally and repeatedly throughout the day.  Protect yourself by wearing long sleeves, pants, a wide-brimmed hat, and sunglasses whenever you are outside. HEART DISEASE, DIABETES, AND HIGH BLOOD PRESSURE   Have your blood pressure checked at least every 1-2 years. High blood pressure causes heart disease and increases the risk of stroke.  If you are between 80 years and 10 years old, ask your health care provider if you should take aspirin to prevent strokes.  Have regular diabetes screenings. This involves  taking a blood sample to check your fasting blood sugar level.  If you are at a normal weight and have a low risk for diabetes, have this test once every three years after 72 years of age.  If you are overweight and have a high risk for diabetes, consider being tested at a younger age or more often. PREVENTING INFECTION  Hepatitis B  If you have a higher risk for hepatitis B, you should be screened for this virus. You are considered at high risk for hepatitis B if:  You were born in a country where hepatitis B is common. Ask your health care provider which countries are considered high risk.  Your parents were born in a high-risk country, and you have not been immunized against hepatitis B (hepatitis B vaccine).  You have HIV or AIDS.  You use needles to inject street drugs.  You live with someone who has hepatitis B.  You have had sex with someone who has hepatitis B.  You get hemodialysis treatment.  You take certain medicines for conditions, including cancer, organ transplantation, and autoimmune conditions. Hepatitis C  Blood testing is recommended for:  Everyone born from [birth year] through 1963.  Anyone with known risk factors for hepatitis C. Sexually transmitted infections (STIs)  You should be screened for sexually transmitted infections (STIs) including gonorrhea and chlamydia if:  You are sexually active and are younger than 72 years of age.  You are older than 72 years of age and your health care provider tells you that you are at risk for this type of infection.  Your sexual activity has changed since you were last screened and you are at an increased risk for chlamydia or gonorrhea. Ask your health care provider if you are at risk.  If you do not have HIV, but are at risk, it may be recommended that you take a prescription medicine daily to prevent HIV infection. This is called pre-exposure prophylaxis (PrEP). You are considered at risk if:  You are sexually active  and do not regularly use condoms or know the HIV status of your partner(s).  You take drugs by injection.  You are sexually active with a partner who has HIV. Talk with your health care provider about whether you are at high risk of being infected with HIV. If you choose to  begin PrEP, you should first be tested for HIV. You should then be tested every 3 months for as long as you are taking PrEP.  PREGNANCY   If you are premenopausal and you may become pregnant, ask your health care provider about preconception counseling.  If you may become pregnant, take 400 to 800 micrograms (mcg) of folic acid every day.  If you want to prevent pregnancy, talk to your health care provider about birth control (contraception). OSTEOPOROSIS AND MENOPAUSE   Osteoporosis is a disease in which the bones lose minerals and strength with aging. This can result in serious bone fractures. Your risk for osteoporosis can be identified using a bone density scan.  If you are 79 years of age or older, or if you are at risk for osteoporosis and fractures, ask your health care provider if you should be screened.  Ask your health care provider whether you should take a calcium or vitamin D supplement to lower your risk for osteoporosis.  Menopause may have certain physical symptoms and risks.  Hormone replacement therapy may reduce some of these symptoms and risks. Talk to your health care provider about whether hormone replacement therapy is right for you.  HOME CARE INSTRUCTIONS   Schedule regular health, dental, and eye exams.  Stay current with your immunizations.   Do not use any tobacco products including cigarettes, chewing tobacco, or electronic cigarettes.  If you are pregnant, do not drink alcohol.  If you are breastfeeding, limit how much and how often you drink alcohol.  Limit alcohol intake to no more than 1 drink per day for nonpregnant women. One drink equals 12 ounces of beer, 5 ounces of wine,  or 1 ounces of hard liquor.  Do not use street drugs.  Do not share needles.  Ask your health care provider for help if you need support or information about quitting drugs.  Tell your health care provider if you often feel depressed.  Tell your health care provider if you have ever been abused or do not feel safe at home. Document Released: 06/01/2011 Document Revised: 04/02/2014 Document Reviewed: 10/18/2013 [facility] Patient Information 2015 [place], [place]. This information is not intended to replace advice given to you by your health care provider. Make sure you discuss any questions you have with your health care provider.

## 2015-02-11 NOTE — Assessment & Plan Note (Signed)
Check lipid panel today, meds as on list. No side effects.

## 2015-02-11 NOTE — Assessment & Plan Note (Signed)
Hydrocodone #30 no refills 5/325 mg for shoulder pain to be used daily prn. She indicates that she uses it only several times per week. Will continue to evaluate and treat as able.

## 2015-02-11 NOTE — Progress Notes (Signed)
   Subjective:    Patient ID: Heather Grant, female    DOB: 07-Jul-1941, 74 y.o.   MRN: [id number]  HPI The patient is a 74 YO female who is coming in today for wellness. She is doing well overall and has no concerns today. She does have some shoulder pain and knee pain. The voltaren gel does a good job but still gets some pains sometimes.   Diet: not healthy Physical activity: sedentary Depression/mood screen: negative Hearing: intact to whispered voice Visual acuity: grossly normal ADLs: capable Fall risk: none Home safety: good Cognitive evaluation: intact to orientation, naming, recall and repetition EOL planning: adv directives discussed, full code/ I agree  I have personally reviewed and have noted 1. The patient's medical and social history - no new changes updated today 2. Their use of alcohol, tobacco or illicit drugs 3. Their current medications and supplements 4. The patient's functional ability including ADL's, fall risks, home safety risks and hearing or visual impairment. 5. Diet and physical activities 6. Evidence for depression or mood disorders 7. Care team reviewed and updated - see snapshot view  Review of Systems  Constitutional: Negative for fever, activity change, appetite change, fatigue and unexpected weight change.  Respiratory: Negative for cough, chest tightness, shortness of breath and wheezing.   Cardiovascular: Negative for chest pain, palpitations and leg swelling.  Gastrointestinal: Negative for abdominal pain and abdominal distention.  Musculoskeletal: Positive for myalgias and arthralgias. Negative for back pain and gait problem.  Neurological: Negative for dizziness, weakness and numbness.      Objective:   Physical Exam  Constitutional: She is oriented to person, place, and time. She appears well-developed and well-nourished.  Morbidly obese  HENT:  Head: Normocephalic and atraumatic.  Eyes: EOM are normal.  Neck: Normal range of  motion.  Cardiovascular: Normal rate and regular rhythm.   No murmur heard. Pulmonary/Chest: Effort normal and breath sounds normal. No respiratory distress. She has no wheezes.  Abdominal: Soft. Bowel sounds are normal. She exhibits no distension. There is no tenderness. There is no rebound.  Musculoskeletal:  Pain in the left shoulder.   Neurological: She is alert and oriented to person, place, and time. Coordination normal.  Skin: Skin is warm and dry.   Filed Vitals:   02/11/15 1020  BP: 136/68  Pulse: 65  Temp: 97.7 F (36.5 C)  TempSrc: Oral  Resp: 18  Height: 4\' 11"  (1.499 m)  Weight: 234 lb (106.142 kg)  SpO2: 96%      Assessment & Plan:  Prevnar 13 given at today's visit.

## 2015-03-03 ENCOUNTER — Other Ambulatory Visit: Payer: Self-pay | Admitting: Internal Medicine

## 2015-03-03 DIAGNOSIS — J45998 Other asthma: Secondary | ICD-10-CM | POA: Diagnosis not present

## 2015-03-03 DIAGNOSIS — R0602 Shortness of breath: Secondary | ICD-10-CM | POA: Diagnosis not present

## 2015-03-04 ENCOUNTER — Ambulatory Visit: Payer: Medicare Other | Admitting: Internal Medicine

## 2015-03-06 ENCOUNTER — Other Ambulatory Visit: Payer: Self-pay | Admitting: Internal Medicine

## 2015-03-19 ENCOUNTER — Other Ambulatory Visit: Payer: Self-pay | Admitting: Internal Medicine

## 2015-04-02 DIAGNOSIS — J45998 Other asthma: Secondary | ICD-10-CM | POA: Diagnosis not present

## 2015-04-02 DIAGNOSIS — R0602 Shortness of breath: Secondary | ICD-10-CM | POA: Diagnosis not present

## 2015-04-11 DIAGNOSIS — M1711 Unilateral primary osteoarthritis, right knee: Secondary | ICD-10-CM | POA: Diagnosis not present

## 2015-04-11 DIAGNOSIS — M170 Bilateral primary osteoarthritis of knee: Secondary | ICD-10-CM | POA: Diagnosis not present

## 2015-04-11 DIAGNOSIS — M1712 Unilateral primary osteoarthritis, left knee: Secondary | ICD-10-CM | POA: Diagnosis not present

## 2015-05-03 DIAGNOSIS — M19012 Primary osteoarthritis, left shoulder: Secondary | ICD-10-CM | POA: Diagnosis not present

## 2015-05-06 ENCOUNTER — Encounter: Payer: Self-pay | Admitting: Internal Medicine

## 2015-05-06 ENCOUNTER — Ambulatory Visit (INDEPENDENT_AMBULATORY_CARE_PROVIDER_SITE_OTHER): Payer: Medicare Other | Admitting: Internal Medicine

## 2015-05-06 VITALS — BP 126/66 | HR 58 | Ht <= 58 in | Wt 233.0 lb

## 2015-05-06 DIAGNOSIS — J4520 Mild intermittent asthma, uncomplicated: Secondary | ICD-10-CM

## 2015-05-06 MED ORDER — MOMETASONE FURO-FORMOTEROL FUM 100-5 MCG/ACT IN AERO: INHALATION_SPRAY | RESPIRATORY_TRACT | Status: DC

## 2015-05-06 NOTE — Progress Notes (Signed)
Subjective:    Patient ID: Heather Grant, female    DOB: 1941/11/15    MRN: [id number]   Brief patient profile:  3 yobf never smoker with ? New onset asthma in Dec 2013 referred 05/31/2013 to pulmonary clinic by Dr Linda Hedges - did have one bad sinus infection age 74 but no resp problems subsequently or need for any type of resp/ allergy med in interim.     History of Present Illness  05/31/2013 1st pulmonary eval cc acute onset cough Dec 2013   treated in ER but   but ever since then cc daily symptoms of coughing day > night assoc with sob and worse when move around and productive of white mucus and feels like something stuck in throat. Some better from inhalers despite very poor hfa, no better with last prednisone course. rec Pantoprazole (protonix) 40 mg   Take 30-60 min before first meal of the day and Pepcid 20 mg one bedtime until return to office - this is the best way to tell whether stomach acid is contributing to your problem.  GERD diet  dulera 100 2puffs every 12 hours if needed for cough or breathing     12/06/2014 f/u ov/Melroy Bougher re: asthma on dulera / using 02 2lpm hs on dulera 100 2bid  Chief Complaint  Patient presents with  . Follow-up    sob-same, no cough.Doing good.Needs refills  proair sev days per week, not typically at night just if going in and out of buildings  rec dulera 100 Take 2 puffs first thing in am and then another 2 puffs about 12 hours later and if doing great leave the pm dose Only use your albuterol (proair) as a rescue medication     05/06/2015 f/u ov/Abiola Behring re: asthma/ on dulera 100 one twice daily    Chief Complaint  Patient presents with  . Follow-up    pt doing well, no complaints today.  does note occ. wheezing with exertion.    rare need for saba even on low dose dulera    No obvious daytime variabilty or assoc cough or cp or chest tightness, subjective wheeze overt  hb symptoms. No unusual exp hx or h/o childhood pna/ asthma or knowledge of  premature birth.   Sleeping ok without nocturnal  or early am exacerbation  of respiratory  c/o's or need for noct saba. Also denies any obvious fluctuation of symptoms with weather or environmental changes or other aggravating or alleviating factors except as outlined above   Current Medications, Allergies, Past Medical History, Past Surgical History, Family History, and Social History were reviewed in [employer] record.  ROS  The following are not active complaints unless bolded sore throat, dysphagia, dental problems, itching, sneezing,  nasal congestion or excess/ purulent secretions, ear ache,   fever, chills, sweats, unintended wt loss, pleuritic or exertional cp, hemoptysis,  orthopnea pnd or leg swelling, presyncope, palpitations, heartburn, abdominal pain, anorexia, nausea, vomiting, diarrhea  or change in bowel or urinary habits, change in stools or urine, dysuria,hematuria,  rash, arthralgias, visual complaints, headache, numbness weakness or ataxia or problems with walking or coordination,  change in mood/affect or memory.            Objective:   Physical Exam   Obese bf nad    07/12/2013        262  > 03/15/2014 234 >  12/06/2014 235 > 05/06/2015 233 Wt Readings from Last 3 Encounters:  05/31/13 263 lb (119.296 kg)  05/01/13 263 lb 6.4 oz (119.477 kg)  03/27/13 266 lb 12 oz (120.997 kg)     HEENT: nl dentition,  and orophanx. Nl external ear canals without cough reflex     NECK :  without JVD/Nodes/TM/ nl carotid upstrokes bilaterally   LUNGS: no acc muscle use, clear to A and P bilaterally without cough on insp or exp maneuvers   CV:  RRR  no s3 or murmur or increase in P2, no edema   ABD:  soft and nontender with nl excursion in the supine position. No bruits or organomegaly, bowel sounds nl  MS:  warm without deformities, calf tenderness, cyanosis or clubbing  SKIN: warm and dry without lesions    NEURO:  alert, approp, no deficits          Assessment & Plan:

## 2015-05-06 NOTE — Patient Instructions (Signed)
Ok to just take the dulera as you are one twice daily but for any increase in symptoms increase back to Take 2 puffs first thing in am and then another 2 puffs about 12 hours later.   Only use your albuterol (Proair = red pump) as a rescue medication to be used if you can't catch your breath by resting or doing a relaxed purse lip breathing pattern.  - The less you use it, the better it will work when you need it. - Ok to use up to 2 puffs  every 4 hours if you must but call for immediate appointment if use goes up over your usual need - Don't leave home without it !!  (think of it like the spare tire for your car)    If you are satisfied with your treatment plan,  let your doctor know and he/she can either refill your medications or you can return here when your prescription runs out.     If in any way you are not 100% satisfied,  please tell us.  If 100% better, tell your friends!  Pulmonary follow up is as needed

## 2015-05-12 ENCOUNTER — Encounter: Payer: Self-pay | Admitting: Internal Medicine

## 2015-05-12 NOTE — Assessment & Plan Note (Signed)
-  PFT's 07/12/2013 wnl p last saba/laba dose > 5 days prior - 12/06/2014 p extensive coaching HFA effectiveness =   90%    I had an extended final summary discussion with the patient reviewing all relevant studies completed to date and  lasting 15 to 20 minutes of a 25 minute visit on the following issues:    1) All goals of chronic asthma control met including optimal function and elimination of symptoms with minimal need for rescue therapy.  Contingencies discussed in full including contacting this office immediately if not controlling the symptoms using the rule of two's.     2) The proper method of use, as well as anticipated side effects, of a metered-dose inhaler are discussed and demonstrated to the patient. Improved effectiveness after extensive coaching during this visit to a level of approximately  90% so should be good to go with both hfa maint and prn  3) Each maintenance medication was reviewed in detail including most importantly the difference between maintenance and as needed and under what circumstances the prns are to be used.  Please see instructions for details which were reviewed in writing and the patient given a copy.    4) pulmonary f/u is prn / refills per primary care

## 2015-05-20 ENCOUNTER — Other Ambulatory Visit: Payer: Self-pay | Admitting: Internal Medicine

## 2015-05-22 ENCOUNTER — Other Ambulatory Visit: Payer: Self-pay | Admitting: Internal Medicine

## 2015-05-27 ENCOUNTER — Other Ambulatory Visit: Payer: Self-pay | Admitting: Internal Medicine

## 2015-06-02 DIAGNOSIS — R0602 Shortness of breath: Secondary | ICD-10-CM | POA: Diagnosis not present

## 2015-06-02 DIAGNOSIS — J45998 Other asthma: Secondary | ICD-10-CM | POA: Diagnosis not present

## 2015-06-17 DIAGNOSIS — M19012 Primary osteoarthritis, left shoulder: Secondary | ICD-10-CM | POA: Diagnosis not present

## 2015-06-22 ENCOUNTER — Other Ambulatory Visit: Payer: Self-pay | Admitting: Internal Medicine

## 2015-06-28 DIAGNOSIS — M19012 Primary osteoarthritis, left shoulder: Secondary | ICD-10-CM | POA: Diagnosis not present

## 2015-07-03 DIAGNOSIS — R0602 Shortness of breath: Secondary | ICD-10-CM | POA: Diagnosis not present

## 2015-07-03 DIAGNOSIS — J45998 Other asthma: Secondary | ICD-10-CM | POA: Diagnosis not present

## 2015-08-03 DIAGNOSIS — R0602 Shortness of breath: Secondary | ICD-10-CM | POA: Diagnosis not present

## 2015-08-03 DIAGNOSIS — J45998 Other asthma: Secondary | ICD-10-CM | POA: Diagnosis not present

## 2015-08-07 DIAGNOSIS — M170 Bilateral primary osteoarthritis of knee: Secondary | ICD-10-CM | POA: Diagnosis not present

## 2015-08-07 DIAGNOSIS — M1711 Unilateral primary osteoarthritis, right knee: Secondary | ICD-10-CM | POA: Diagnosis not present

## 2015-08-07 DIAGNOSIS — M1712 Unilateral primary osteoarthritis, left knee: Secondary | ICD-10-CM | POA: Diagnosis not present

## 2015-08-22 ENCOUNTER — Ambulatory Visit: Payer: Medicare Other | Admitting: Internal Medicine

## 2015-09-02 DIAGNOSIS — R0602 Shortness of breath: Secondary | ICD-10-CM | POA: Diagnosis not present

## 2015-09-02 DIAGNOSIS — J45998 Other asthma: Secondary | ICD-10-CM | POA: Diagnosis not present

## 2015-09-13 ENCOUNTER — Encounter: Payer: Self-pay | Admitting: Internal Medicine

## 2015-09-13 ENCOUNTER — Ambulatory Visit (INDEPENDENT_AMBULATORY_CARE_PROVIDER_SITE_OTHER): Payer: Medicare Other | Admitting: Internal Medicine

## 2015-09-13 ENCOUNTER — Ambulatory Visit: Payer: Medicare Other | Admitting: Internal Medicine

## 2015-09-13 ENCOUNTER — Other Ambulatory Visit: Payer: Self-pay | Admitting: Internal Medicine

## 2015-09-13 VITALS — BP 142/88 | HR 67 | Temp 98.3°F | Resp 14 | Ht <= 58 in | Wt 230.0 lb

## 2015-09-13 DIAGNOSIS — J4520 Mild intermittent asthma, uncomplicated: Secondary | ICD-10-CM | POA: Diagnosis not present

## 2015-09-13 DIAGNOSIS — M1A0710 Idiopathic chronic gout, right ankle and foot, without tophus (tophi): Secondary | ICD-10-CM

## 2015-09-13 DIAGNOSIS — I10 Essential (primary) hypertension: Secondary | ICD-10-CM

## 2015-09-13 NOTE — Assessment & Plan Note (Signed)
Weight down about 3 pounds, encouraged her to exercise more and may be able to go to silver sneakers with her insurance. Talked to her about her diet and she is not sure that she can make any changes today.

## 2015-09-13 NOTE — Patient Instructions (Signed)
We will get the paperwork done and faxed back for the aid. We are not changing your medicines today and do not need blood work.   Exercising to Stay Healthy Exercising regularly is important. It has many health benefits, such as:  Improving your overall fitness, flexibility, and endurance.  Increasing your bone density.  Helping with weight control.  Decreasing your body fat.  Increasing your muscle strength.  Reducing stress and tension.  Improving your overall health. In order to become healthy and stay healthy, it is recommended that you do moderate-intensity and vigorous-intensity exercise. You can tell that you are exercising at a moderate intensity if you have a higher heart rate and faster breathing, but you are still able to hold a conversation. You can tell that you are exercising at a vigorous intensity if you are breathing much harder and faster and cannot hold a conversation while exercising. HOW OFTEN SHOULD I EXERCISE? Choose an activity that you enjoy and set realistic goals. Your health care provider can help you to make an activity plan that works for you. Exercise regularly as directed by your health care provider. This may include:   Doing resistance training twice each week, such as:  Push-ups.  Sit-ups.  Lifting weights.  Using resistance bands.  Doing a given intensity of exercise for a given amount of time. Choose from these options:  150 minutes of moderate-intensity exercise every week.  75 minutes of vigorous-intensity exercise every week.  A mix of moderate-intensity and vigorous-intensity exercise every week. Children, pregnant women, people who are out of shape, people who are overweight, and older adults may need to consult a health care provider for individual recommendations. If you have any sort of medical condition, be sure to consult your health care provider before starting a new exercise program.  WHAT ARE SOME EXERCISE IDEAS? Some  moderate-intensity exercise ideas include:   Walking at a rate of 1 mile in 15 minutes.  Biking.  Hiking.  Golfing.  Dancing. Some vigorous-intensity exercise ideas include:   Walking at a rate of at least 4.5 miles per hour.  Jogging or running at a rate of 5 miles per hour.  Biking at a rate of at least 10 miles per hour.  Lap swimming.  Roller-skating or in-line skating.  Cross-country skiing.  Vigorous competitive sports, such as football, basketball, and soccer.  Jumping rope.  Aerobic dancing. WHAT ARE SOME EVERYDAY ACTIVITIES THAT CAN HELP ME TO GET EXERCISE?  Yard work, such as:  Psychologist, educational.  Raking and bagging leaves.  Washing and waxing your car.  Pushing a stroller.  Shoveling snow.  Gardening.  Washing windows or floors. HOW CAN I BE MORE ACTIVE IN MY DAY-TO-DAY ACTIVITIES?  Use the stairs instead of the elevator.  Take a walk during your lunch break.  If you drive, park your car farther away from work or school.  If you take public transportation, get off one stop early and walk the rest of the way.  Make all of your phone calls while standing up and walking around.  Get up, stretch, and walk around every 30 minutes throughout the day. WHAT GUIDELINES SHOULD I FOLLOW WHILE EXERCISING?  Do not exercise so much that you hurt yourself, feel dizzy, or get very short of breath.  Consult your health care provider before starting a new exercise program.  Wear comfortable clothes and shoes with good support.  Drink plenty of water while you exercise to prevent dehydration or heat  stroke. Body water is lost during exercise and must be replaced.  Work out until you breathe faster and your heart beats faster.   This information is not intended to replace advice given to you by your health care provider. Make sure you discuss any questions you have with your health care provider.   Document Released: 12/19/2010 Document Revised:  12/07/2014 Document Reviewed: 04/19/2014 Elsevier Interactive Patient Education [employer].

## 2015-09-13 NOTE — Assessment & Plan Note (Signed)
Doing well on dulera and has not used her rescue inhaler much recently. Stable and no exacerbation today.

## 2015-09-13 NOTE — Assessment & Plan Note (Signed)
BP at goal on recheck with hydralazine and metoprolol and valsartan. Recent BMP reviewed and no indication for change.

## 2015-09-13 NOTE — Progress Notes (Signed)
   Subjective:    Patient ID: Heather Grant, female    DOB: [birth year], 74 y.o.   MRN: [id number]  HPI The patient is a 74 YO female coming in for follow up of her blood pressure (previously well controlled on hydralazine, lopressor, and valsartan but mildly elevated today, complicated by CKD stage 3), and her obesity (not exercising much and weight down 3 pounds since last visit, not changed her diet at all, complicated by osteoarthritis and hypertension), and her asthma (not needing her rescue inhaler much lately, dulera for control, able to get around without dyspnea). No new concerns and breathing is doing much better than last time.   Review of Systems  Constitutional: Negative for fever, activity change, appetite change, fatigue and unexpected weight change.  Respiratory: Negative for cough, chest tightness, shortness of breath and wheezing.   Cardiovascular: Negative for chest pain, palpitations and leg swelling.  Gastrointestinal: Negative for abdominal pain and abdominal distention.  Musculoskeletal: Positive for myalgias and arthralgias. Negative for back pain and gait problem.       Left shoulder pain  Skin: Negative.   Neurological: Negative for dizziness, weakness and numbness.  Psychiatric/Behavioral: Negative.       Objective:   Physical Exam  Constitutional: She is oriented to person, place, and time. She appears well-developed and well-nourished.  Morbidly obese  HENT:  Head: Normocephalic and atraumatic.  Eyes: EOM are normal.  Neck: Normal range of motion.  Cardiovascular: Normal rate and regular rhythm.   No murmur heard. Pulmonary/Chest: Effort normal and breath sounds normal. No respiratory distress. She has no wheezes.  Abdominal: Soft. Bowel sounds are normal. She exhibits no distension. There is no tenderness. There is no rebound.  Musculoskeletal:  Pain in the left shoulder.   Neurological: She is alert and oriented to person, place, and time.  Coordination normal.  Skin: Skin is warm and dry.   Filed Vitals:   09/13/15 0923 09/13/15 0943  BP: 152/70 142/88  Pulse: 67   Temp: 98.3 F (36.8 C)   TempSrc: Oral   Resp: 14   Height: 4\' 9"  (1.448 m)   Weight: 230 lb (104.327 kg)   SpO2: 97%       Assessment & Plan:

## 2015-09-13 NOTE — Assessment & Plan Note (Signed)
Doing better with her gout. No recent flares and no flares recently. Taking her allopurinol and not needed her colchicine recently.

## 2015-09-13 NOTE — Progress Notes (Signed)
Pre visit review using our clinic review tool, if applicable. No additional management support is needed unless otherwise documented below in the visit note. 

## 2015-09-16 ENCOUNTER — Telehealth: Payer: Self-pay | Admitting: Internal Medicine

## 2015-09-16 NOTE — Telephone Encounter (Signed)
Per 05/2015 OV: Ok to just take the dulera as you are one twice daily but for any increase in symptoms increase back to Take 2 puffs first thing in am and then another 2 puffs about 12 hours later.  Only use your albuterol (Proair = red pump) as a rescue medication to be used if you can't catch your breath by resting or doing a relaxed purse lip breathing pattern.  - The less you use it, the better it will work when you need it. - Ok to use up to 2 puffs  every 4 hours if you must but call for immediate appointment if use goes up over your usual need - Don't leave home without it !!  (think of it like the spare tire for your car)   If you are satisfied with your treatment plan,  let your doctor know and he/she can either refill your medications or you can return here when your prescription runs out.    If in any way you are not 100% satisfied,  please tell us.  If 100% better, tell your friends! Pulmonary follow up is as needed ----  lmomtcb x1

## 2015-09-17 ENCOUNTER — Telehealth: Payer: Self-pay | Admitting: Internal Medicine

## 2015-09-17 ENCOUNTER — Other Ambulatory Visit: Payer: Self-pay | Admitting: Geriatric Medicine

## 2015-09-17 MED ORDER — FAMOTIDINE 20 MG PO TABS: 20.0000 mg | ORAL_TABLET | Freq: Every day | ORAL | Status: DC

## 2015-09-17 NOTE — Telephone Encounter (Signed)
Sent to pharmacy 

## 2015-09-17 NOTE — Telephone Encounter (Signed)
Patient is requesting a refill of famotidine (PEPCID) 20 MG tablet [[id number]] send to [facility] on [address]. Dr wert was previously managing this

## 2015-09-17 NOTE — Telephone Encounter (Signed)
Called and spoke to pt. Informed pt to have PCP fill medications or Dr. Melvyn Novas could fill medications if she were to come in for OV. Pt stated she would have PCP fill medication because she is doing so well and does not feel she needs to be seen by MW. Nothing further needed at this time.

## 2015-09-19 ENCOUNTER — Other Ambulatory Visit: Payer: Self-pay | Admitting: Internal Medicine

## 2015-09-25 ENCOUNTER — Telehealth: Payer: Self-pay | Admitting: Internal Medicine

## 2015-09-25 NOTE — Telephone Encounter (Signed)
Pt has a dentist appt on 11/8 and they are wanting a letter faxed to them stating that her blood pressure and medications she is taking is ok for her to have her teeth cleaned. Please fax to Dr. Laurena Spies at [phone]

## 2015-09-26 NOTE — Telephone Encounter (Signed)
Letter printed and signed.  

## 2015-09-26 NOTE — Telephone Encounter (Signed)
Faxed

## 2015-10-03 DIAGNOSIS — R0602 Shortness of breath: Secondary | ICD-10-CM | POA: Diagnosis not present

## 2015-10-03 DIAGNOSIS — J45998 Other asthma: Secondary | ICD-10-CM | POA: Diagnosis not present

## 2015-10-10 ENCOUNTER — Telehealth: Payer: Self-pay | Admitting: Internal Medicine

## 2015-10-10 MED ORDER — CETIRIZINE HCL 10 MG PO TABS: 10.0000 mg | ORAL_TABLET | Freq: Every day | ORAL | Status: DC

## 2015-10-10 NOTE — Telephone Encounter (Signed)
Rx for zyrtec (cetirizine) sent in. 1 pill daily to help with allergies.

## 2015-10-10 NOTE — Telephone Encounter (Signed)
Patient aware and will go pick up 

## 2015-10-10 NOTE — Telephone Encounter (Signed)
Pt is blowing her nose constantly. She is wondering if you can call her in something for this. Pharmacy is [facility].

## 2015-11-02 DIAGNOSIS — J45998 Other asthma: Secondary | ICD-10-CM | POA: Diagnosis not present

## 2015-11-02 DIAGNOSIS — R0602 Shortness of breath: Secondary | ICD-10-CM | POA: Diagnosis not present

## 2015-11-21 DIAGNOSIS — M1712 Unilateral primary osteoarthritis, left knee: Secondary | ICD-10-CM | POA: Diagnosis not present

## 2015-11-21 DIAGNOSIS — M1711 Unilateral primary osteoarthritis, right knee: Secondary | ICD-10-CM | POA: Diagnosis not present

## 2015-11-21 DIAGNOSIS — M170 Bilateral primary osteoarthritis of knee: Secondary | ICD-10-CM | POA: Diagnosis not present

## 2015-11-26 DIAGNOSIS — Z1231 Encounter for screening mammogram for malignant neoplasm of breast: Secondary | ICD-10-CM | POA: Diagnosis not present

## 2015-11-28 ENCOUNTER — Other Ambulatory Visit: Payer: Self-pay | Admitting: Internal Medicine

## 2015-12-03 DIAGNOSIS — J45998 Other asthma: Secondary | ICD-10-CM | POA: Diagnosis not present

## 2015-12-03 DIAGNOSIS — R0602 Shortness of breath: Secondary | ICD-10-CM | POA: Diagnosis not present

## 2015-12-25 DIAGNOSIS — H04123 Dry eye syndrome of bilateral lacrimal glands: Secondary | ICD-10-CM | POA: Diagnosis not present

## 2015-12-25 DIAGNOSIS — Z961 Presence of intraocular lens: Secondary | ICD-10-CM | POA: Diagnosis not present

## 2015-12-25 DIAGNOSIS — H43813 Vitreous degeneration, bilateral: Secondary | ICD-10-CM | POA: Diagnosis not present

## 2015-12-25 DIAGNOSIS — H5213 Myopia, bilateral: Secondary | ICD-10-CM | POA: Diagnosis not present

## 2016-01-03 DIAGNOSIS — R0602 Shortness of breath: Secondary | ICD-10-CM | POA: Diagnosis not present

## 2016-01-03 DIAGNOSIS — J45998 Other asthma: Secondary | ICD-10-CM | POA: Diagnosis not present

## 2016-01-31 ENCOUNTER — Telehealth: Payer: Self-pay | Admitting: Internal Medicine

## 2016-01-31 ENCOUNTER — Other Ambulatory Visit: Payer: Self-pay | Admitting: Internal Medicine

## 2016-01-31 DIAGNOSIS — R0602 Shortness of breath: Secondary | ICD-10-CM | POA: Diagnosis not present

## 2016-01-31 DIAGNOSIS — J45998 Other asthma: Secondary | ICD-10-CM | POA: Diagnosis not present

## 2016-01-31 MED ORDER — PANTOPRAZOLE SODIUM 40 MG PO TBEC: DELAYED_RELEASE_TABLET | ORAL | Status: DC

## 2016-01-31 NOTE — Telephone Encounter (Signed)
Called spoke with pt. She needs refill in pantoprazole. i have refilled this. Nothing further needed

## 2016-02-07 ENCOUNTER — Other Ambulatory Visit: Payer: Self-pay | Admitting: Internal Medicine

## 2016-03-02 DIAGNOSIS — R0602 Shortness of breath: Secondary | ICD-10-CM | POA: Diagnosis not present

## 2016-03-02 DIAGNOSIS — J45998 Other asthma: Secondary | ICD-10-CM | POA: Diagnosis not present

## 2016-03-10 ENCOUNTER — Ambulatory Visit (INDEPENDENT_AMBULATORY_CARE_PROVIDER_SITE_OTHER): Payer: Medicare Other | Admitting: Internal Medicine

## 2016-03-10 ENCOUNTER — Other Ambulatory Visit (INDEPENDENT_AMBULATORY_CARE_PROVIDER_SITE_OTHER): Payer: Medicare Other

## 2016-03-10 ENCOUNTER — Encounter: Payer: Self-pay | Admitting: Internal Medicine

## 2016-03-10 VITALS — BP 202/90 | HR 52 | Temp 98.7°F | Resp 12 | Ht <= 58 in | Wt 235.0 lb

## 2016-03-10 DIAGNOSIS — I10 Essential (primary) hypertension: Secondary | ICD-10-CM | POA: Diagnosis not present

## 2016-03-10 DIAGNOSIS — E785 Hyperlipidemia, unspecified: Secondary | ICD-10-CM | POA: Diagnosis not present

## 2016-03-10 DIAGNOSIS — M1A9XX0 Chronic gout, unspecified, without tophus (tophi): Secondary | ICD-10-CM | POA: Diagnosis not present

## 2016-03-10 LAB — LIPID PANEL
Cholesterol: 237 mg/dL — ABNORMAL HIGH (ref 0–200)
HDL: 51.1 mg/dL (ref >39.00)
LDL Cholesterol: 160 mg/dL — ABNORMAL HIGH (ref 0–99)
NonHDL: 185.82
Total CHOL/HDL Ratio: 5
Triglycerides: 129 mg/dL (ref 0.0–149.0)
VLDL: 25.8 mg/dL (ref 0.0–40.0)

## 2016-03-10 LAB — COMPREHENSIVE METABOLIC PANEL
ALT: 14 U/L (ref 0–35)
AST: 14 U/L (ref 0–37)
Albumin: 4.1 g/dL (ref 3.5–5.2)
Alkaline Phosphatase: 57 U/L (ref 39–117)
BUN: 15 mg/dL (ref 6–23)
CO2: 32 mEq/L (ref 19–32)
Calcium: 10 mg/dL (ref 8.4–10.5)
Chloride: 107 mEq/L (ref 96–112)
Creatinine, Ser: 1.12 mg/dL (ref 0.40–1.20)
GFR: 61.31 mL/min (ref >60.00)
Glucose, Bld: 100 mg/dL — ABNORMAL HIGH (ref 70–99)
Potassium: 4.3 mEq/L (ref 3.5–5.1)
Sodium: 145 mEq/L (ref 135–145)
Total Bilirubin: 0.5 mg/dL (ref 0.2–1.2)
Total Protein: 6.8 g/dL (ref 6.0–8.3)

## 2016-03-10 LAB — HEMOGLOBIN A1C: Hgb A1c MFr Bld: 5 % (ref 4.6–6.5)

## 2016-03-10 LAB — URIC ACID: Uric Acid, Serum: 7.6 mg/dL — ABNORMAL HIGH (ref 2.4–7.0)

## 2016-03-10 MED ORDER — HYDROCODONE-ACETAMINOPHEN 5-325 MG PO TABS: 1.0000 | ORAL_TABLET | Freq: Every day | ORAL | Status: DC | PRN

## 2016-03-10 NOTE — Patient Instructions (Signed)
We are checking the blood work today and will call you back with the results.   We will try to fill out the forms again and send them in. If you do not hear anything within 2-3 weeks call the social services to see if they have received the forms.   Diabetes and Exercise Exercising regularly is important. It is not just about losing weight. It has many health benefits, such as:  Improving your overall fitness, flexibility, and endurance.  Increasing your bone density.  Helping with weight control.  Decreasing your body fat.  Increasing your muscle strength.  Reducing stress and tension.  Improving your overall health. People with diabetes who exercise gain additional benefits because exercise:  Reduces appetite.  Improves the body's use of blood sugar (glucose).  Helps lower or control blood glucose.  Decreases blood pressure.  Helps control blood lipids (such as cholesterol and triglycerides).  Improves the body's use of the hormone insulin by:  Increasing the body's insulin sensitivity.  Reducing the body's insulin needs.  Decreases the risk for heart disease because exercising:  Lowers cholesterol and triglycerides levels.  Increases the levels of good cholesterol (such as high-density lipoproteins [HDL]) in the body.  Lowers blood glucose levels. YOUR ACTIVITY PLAN  Choose an activity that you enjoy, and set realistic goals. To exercise safely, you should begin practicing any new physical activity slowly, and gradually increase the intensity of the exercise over time. Your health care provider or diabetes educator can help create an activity plan that works for you. General recommendations include:  Encouraging children to engage in at least 60 minutes of physical activity each day.  Stretching and performing strength training exercises, such as yoga or weight lifting, at least 2 times per week.  Performing a total of at least 150 minutes of moderate-intensity  exercise each week, such as brisk walking or water aerobics.  Exercising at least 3 days per week, making sure you allow no more than 2 consecutive days to pass without exercising.  Avoiding long periods of inactivity (90 minutes or more). When you have to spend an extended period of time sitting down, take frequent breaks to walk or stretch. RECOMMENDATIONS FOR EXERCISING WITH TYPE 1 OR TYPE 2 DIABETES   Check your blood glucose before exercising. If blood glucose levels are greater than 240 mg/dL, check for urine ketones. Do not exercise if ketones are present.  Avoid injecting insulin into areas of the body that are going to be exercised. For example, avoid injecting insulin into:  The arms when playing tennis.  The legs when jogging.  Keep a record of:  Food intake before and after you exercise.  Expected peak times of insulin action.  Blood glucose levels before and after you exercise.  The type and amount of exercise you have done.  Review your records with your health care provider. Your health care provider will help you to develop guidelines for adjusting food intake and insulin amounts before and after exercising.  If you take insulin or oral hypoglycemic agents, watch for signs and symptoms of hypoglycemia. They include:  Dizziness.  Shaking.  Sweating.  Chills.  Confusion.  Drink plenty of water while you exercise to prevent dehydration or heat stroke. Body water is lost during exercise and must be replaced.  Talk to your health care provider before starting an exercise program to make sure it is safe for you. Remember, almost any type of activity is better than none.   This information  is not intended to replace advice given to you by your health care provider. Make sure you discuss any questions you have with your health care provider.   Document Released: 02/06/2004 Document Revised: 04/02/2015 Document Reviewed: 04/25/2013 Elsevier Interactive Patient  Education [employer].

## 2016-03-10 NOTE — Progress Notes (Signed)
Pre visit review using our clinic review tool, if applicable. No additional management support is needed unless otherwise documented below in the visit note. 

## 2016-03-11 ENCOUNTER — Telehealth: Payer: Self-pay | Admitting: Internal Medicine

## 2016-03-11 NOTE — Telephone Encounter (Signed)
Pt request referral to go to [facility] for nurse to come out and monitor her BP. Please help, their phone # [phone], [place] location.

## 2016-03-12 ENCOUNTER — Other Ambulatory Visit: Payer: Self-pay | Admitting: Internal Medicine

## 2016-03-12 MED ORDER — ATORVASTATIN CALCIUM 20 MG PO TABS: 20.0000 mg | ORAL_TABLET | Freq: Every day | ORAL | Status: DC

## 2016-03-12 NOTE — Progress Notes (Signed)
   Subjective:    Patient ID: Heather Grant, female    DOB: 1941-07-16, 75 y.o.   MRN: [id number]  HPI The patient is a 75 YO female coming in for follow up of her blood pressure (she has not taken her blood pressure medicine today, no headaches or chest pains, thinks her pressures have been better at home). She admits to some extra stress in her life right now. Taking her valsartan, hydralazine, metoprolol without side effects. She has not had a gout flare since last visit. Taking her allopurinol daily and the colchicine rarely.   Review of Systems  Constitutional: Negative for fever, activity change, appetite change, fatigue and unexpected weight change.  Respiratory: Negative for cough, chest tightness, shortness of breath and wheezing.   Cardiovascular: Negative for chest pain, palpitations and leg swelling.  Gastrointestinal: Negative for abdominal pain and abdominal distention.  Musculoskeletal: Positive for arthralgias. Negative for myalgias, back pain and gait problem.  Skin: Negative.   Neurological: Negative for dizziness, weakness and numbness.  Psychiatric/Behavioral: Negative.       Objective:   Physical Exam  Constitutional: She is oriented to person, place, and time. She appears well-developed and well-nourished.  Morbidly obese  HENT:  Head: Normocephalic and atraumatic.  Eyes: EOM are normal.  Neck: Normal range of motion.  Cardiovascular: Normal rate and regular rhythm.   No murmur heard. Pulmonary/Chest: Effort normal and breath sounds normal. No respiratory distress. She has no wheezes.  Abdominal: Soft. Bowel sounds are normal. She exhibits no distension. There is no tenderness. There is no rebound.  Neurological: She is alert and oriented to person, place, and time. Coordination normal.  Skin: Skin is warm and dry.   Filed Vitals:   03/10/16 1021  BP: 202/90  Pulse: 52  Temp: 98.7 F (37.1 C)  TempSrc: Oral  Resp: 12  Height: 4\' 9"  (1.448 m)    Weight: 235 lb (106.595 kg)  SpO2: 97%      Assessment & Plan:

## 2016-03-12 NOTE — Telephone Encounter (Signed)
Fine with me

## 2016-03-12 NOTE — Assessment & Plan Note (Signed)
Previously at goal on same therapy and will adjust as needed next visit. She will monitor closely at home and if elevated call and we will make change. She will work on exercising again to help with control and watching her sodium intake.

## 2016-03-12 NOTE — Assessment & Plan Note (Signed)
Checking uric acid to see if she is at goal. Adjust as needed. No flare recently.

## 2016-03-18 NOTE — Telephone Encounter (Signed)
Heather Grant, Please follow up on this request. Patient called to check on this

## 2016-03-19 ENCOUNTER — Other Ambulatory Visit: Payer: Self-pay | Admitting: Internal Medicine

## 2016-03-20 NOTE — Telephone Encounter (Signed)
Patient spoke with Social Services and they told her if she contacts her PCP and gets a referral for home health through them, they can come out and assist her with ADL's and to keep a check on her blood pressure. She says she can't clean her house and she needs help with that. She needs them to wash her clothes and cook for her and she said she was told by social services that they can handle all of that. 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 She cannot stand up for long periods of time due to her back and leg pain. Please advise, thanks.

## 2016-03-23 NOTE — Telephone Encounter (Signed)
What insurance does she have? If medicaid can you get a PCS form that seems to be what she is asking for. If she just has medicare they do not cover an aid. She would be able to hire someone privately.

## 2016-04-01 DIAGNOSIS — R0602 Shortness of breath: Secondary | ICD-10-CM | POA: Diagnosis not present

## 2016-04-01 DIAGNOSIS — J45998 Other asthma: Secondary | ICD-10-CM | POA: Diagnosis not present

## 2016-04-08 ENCOUNTER — Other Ambulatory Visit: Payer: Self-pay | Admitting: Internal Medicine

## 2016-05-02 DIAGNOSIS — J45998 Other asthma: Secondary | ICD-10-CM | POA: Diagnosis not present

## 2016-05-02 DIAGNOSIS — R0602 Shortness of breath: Secondary | ICD-10-CM | POA: Diagnosis not present

## 2016-05-07 ENCOUNTER — Ambulatory Visit: Payer: Self-pay | Admitting: Internal Medicine

## 2016-05-21 ENCOUNTER — Ambulatory Visit: Payer: Self-pay | Admitting: Internal Medicine

## 2016-05-24 ENCOUNTER — Other Ambulatory Visit: Payer: Self-pay | Admitting: Internal Medicine

## 2016-05-25 ENCOUNTER — Other Ambulatory Visit: Payer: Self-pay | Admitting: Internal Medicine

## 2016-05-27 ENCOUNTER — Telehealth: Payer: Self-pay | Admitting: Internal Medicine

## 2016-05-27 MED ORDER — MOMETASONE FURO-FORMOTEROL FUM 100-5 MCG/ACT IN AERO: INHALATION_SPRAY | RESPIRATORY_TRACT | Status: DC

## 2016-05-27 NOTE — Telephone Encounter (Signed)
Called spoke with pt. She needs refill on dulera. This has been sent in and aware to keep pending appt for further refills. Nothing further needed

## 2016-06-01 DIAGNOSIS — R0602 Shortness of breath: Secondary | ICD-10-CM | POA: Diagnosis not present

## 2016-06-01 DIAGNOSIS — J45998 Other asthma: Secondary | ICD-10-CM | POA: Diagnosis not present

## 2016-06-10 ENCOUNTER — Other Ambulatory Visit: Payer: Self-pay | Admitting: *Deleted

## 2016-06-10 MED ORDER — VALSARTAN 320 MG PO TABS: ORAL_TABLET | ORAL | Status: DC

## 2016-06-10 MED ORDER — COLCHICINE 0.6 MG PO TABS: 0.6000 mg | ORAL_TABLET | Freq: Every day | ORAL | Status: DC

## 2016-06-10 NOTE — Addendum Note (Signed)
Addended by: Earnstine Regal on: 06/10/2016 10:27 AM   Modules accepted: Orders

## 2016-06-11 DIAGNOSIS — M1711 Unilateral primary osteoarthritis, right knee: Secondary | ICD-10-CM | POA: Diagnosis not present

## 2016-06-11 DIAGNOSIS — M1712 Unilateral primary osteoarthritis, left knee: Secondary | ICD-10-CM | POA: Diagnosis not present

## 2016-06-11 DIAGNOSIS — M170 Bilateral primary osteoarthritis of knee: Secondary | ICD-10-CM | POA: Diagnosis not present

## 2016-06-19 ENCOUNTER — Encounter: Payer: Self-pay | Admitting: Internal Medicine

## 2016-06-19 ENCOUNTER — Ambulatory Visit (INDEPENDENT_AMBULATORY_CARE_PROVIDER_SITE_OTHER): Payer: Medicare Other | Admitting: Internal Medicine

## 2016-06-19 VITALS — BP 142/70 | HR 52 | Ht <= 58 in | Wt 230.0 lb

## 2016-06-19 DIAGNOSIS — J4520 Mild intermittent asthma, uncomplicated: Secondary | ICD-10-CM | POA: Diagnosis not present

## 2016-06-19 DIAGNOSIS — K219 Gastro-esophageal reflux disease without esophagitis: Secondary | ICD-10-CM

## 2016-06-19 MED ORDER — MOMETASONE FURO-FORMOTEROL FUM 100-5 MCG/ACT IN AERO: INHALATION_SPRAY | RESPIRATORY_TRACT | Status: DC

## 2016-06-19 NOTE — Assessment & Plan Note (Signed)
-  PFT's 07/12/2013 wnl p last saba/laba dose > 5 days prior - 12/06/2014 p extensive coaching HFA effectiveness =   90%    I had an extended final summary discussion with the patient reviewing all relevant studies completed to date and  lasting 15 to 20 minutes of a 25 minute visit on the following issues:    All goals of chronic asthma control met including optimal function and elimination of symptoms with minimal need for rescue therapy.  Contingencies discussed in full including contacting this office immediately if not controlling the symptoms using the rule of two's.     Each maintenance medication was reviewed in detail including most importantly the difference between maintenance and as needed and under what circumstances the prns are to be used.  Please see instructions for details which were reviewed in writing and the patient given a copy.

## 2016-06-19 NOTE — Assessment & Plan Note (Signed)
Discussed the recent press about ppi's in the context of a statistically significant (but questionably clinically relevant) increase in CRI in pts on ppi vs h2's > bottom line is the lowest dose of ppi that controls   gerd is the right dose and if that dose is zero that's fine esp since h2's are cheaper.     

## 2016-06-19 NOTE — Progress Notes (Signed)
Subjective:    Patient ID: Heather Grant, female    DOB: Apr 24, 1941    MRN: [id number]   Brief patient profile:  39   yobf never smoker with ? New onset asthma in Dec 2013 referred 05/31/2013 to pulmonary clinic by Dr Linda Hedges - did have one bad sinus infection age 75 but no resp problems subsequently or need for any type of resp/ allergy med in interim.     History of Present Illness  05/31/2013 1st pulmonary eval cc acute onset cough Dec 2013   treated in ER but   but ever since then cc daily symptoms of coughing day > night assoc with sob and worse when move around and productive of white mucus and feels like something stuck in throat. Some better from inhalers despite very poor hfa, no better with last prednisone course. rec Pantoprazole (protonix) 40 mg   Take 30-60 min before first meal of the day and Pepcid 20 mg one bedtime until return to office - this is the best way to tell whether stomach acid is contributing to your problem.  GERD diet  dulera 100 2puffs every 12 hours if needed for cough or breathing     12/06/2014 f/u ov/Miles Leyda re: asthma on dulera / using 02 2lpm hs on dulera 100 2bid  Chief Complaint  Patient presents with  . Follow-up    sob-same, no cough.Doing good.Needs refills  proair sev days per week, not typically at night just if going in and out of buildings  rec dulera 100 Take 2 puffs first thing in am and then another 2 puffs about 12 hours later and if doing great leave the pm dose Only use your albuterol (proair) as a rescue medication     05/06/2015 f/u ov/Duaine Radin re: asthma/ on dulera 100 one twice daily    Chief Complaint  Patient presents with  . Follow-up    pt doing well, no complaints today.  does note occ. wheezing with exertion.    rare need for saba even on low dose dulera  rec Ok to just take the dulera as you are one twice daily but for any increase in symptoms increase back to Take 2 puffs first thing in am and then another 2 puffs about 12  hours later.  Only use your albuterol (Proair = red pump) as a rescue medication    06/19/2016  f/u ov/Sharel Behne re: asthma / dulera 200  1-2 bid   Chief Complaint  Patient presents with  . Follow-up    Breathing is overall doing well. She is using albuterol inhaler 2 x per wk on average.   Not limited by breathing from desired activities  But very sedentary due to wt.  No obvious daytime variabilty or assoc excess/ purulent sputum or mucus plugs   or cp or chest tightness, subjective wheeze overt  hb symptoms. No unusual exp hx or h/o childhood pna/ asthma or knowledge of premature birth.   Sleeping ok without nocturnal  or early am exacerbation  of respiratory  c/o's or need for noct saba. Also denies any obvious fluctuation of symptoms with weather or environmental changes or other aggravating or alleviating factors except as outlined above   Current Medications, Allergies, Past Medical History, Past Surgical History, Family History, and Social History were reviewed in [employer] record.  ROS  The following are not active complaints unless bolded sore throat, dysphagia, dental problems, itching, sneezing,  nasal congestion or excess/ purulent secretions, ear ache,  fever, chills, sweats, unintended wt loss, pleuritic or exertional cp, hemoptysis,  orthopnea pnd or leg swelling, presyncope, palpitations, heartburn, abdominal pain, anorexia, nausea, vomiting, diarrhea  or change in bowel or urinary habits, change in stools or urine, dysuria,hematuria,  rash, arthralgias, visual complaints, headache, numbness weakness or ataxia or problems with walking or coordination,  change in mood/affect or memory.            Objective:   Physical Exam   Obese bf nad  / vital signs reviewed  07/12/2013        262  > 03/15/2014 234 >  12/06/2014 235 > 05/06/2015 233 > 06/19/2016  230     05/31/13 263 lb (119.296 kg)  05/01/13 263 lb 6.4 oz (119.477 kg)  03/27/13 266 lb 12 oz (120.997  kg)     HEENT: nl dentition,  and orophanx. Nl external ear canals without cough reflex     NECK :  without JVD/Nodes/TM/ nl carotid upstrokes bilaterally   LUNGS: no acc muscle use, clear to A and P bilaterally without cough on insp or exp maneuvers   CV:  RRR  no s3 or murmur or increase in P2, no edema   ABD:  soft and nontender with nl excursion in the supine position. No bruits or organomegaly, bowel sounds nl  MS:  warm without deformities, calf tenderness, cyanosis or clubbing  SKIN: warm and dry without lesions    NEURO:  alert, approp, no deficits         Assessment & Plan:   Outpatient Encounter Prescriptions as of 06/19/2016  Medication Sig  . albuterol (VENTOLIN HFA) 108 (90 BASE) MCG/ACT inhaler Inhale 2 puffs into the lungs every 6 (six) hours as needed for wheezing.  Marland Kitchen allopurinol (ZYLOPRIM) 300 MG tablet TAKE 1 TABLET (300 MG TOTAL) BY MOUTH DAILY.  Marland Kitchen atorvastatin (LIPITOR) 20 MG tablet Take 1 tablet (20 mg total) by mouth daily.  . colchicine (COLCRYS) 0.6 MG tablet Take 1 tablet (0.6 mg total) by mouth daily. For gout flare  . famotidine (PEPCID) 20 MG tablet TAKE 1 TABLET (20 MG TOTAL) BY MOUTH AT BEDTIME.  . hydrALAZINE (APRESOLINE) 25 MG tablet TAKE 1 TABLET (25 MG TOTAL) BY MOUTH 3 (THREE) TIMES DAILY.  Marland Kitchen HYDROcodone-acetaminophen (NORCO/VICODIN) 5-325 MG tablet Take 1 tablet by mouth daily as needed for moderate pain.  . metoprolol tartrate (LOPRESSOR) 25 MG tablet TAKE 1 TABLET BY MOUTH TWO TIMES DAILY  . mometasone-formoterol (DULERA) 100-5 MCG/ACT AERO Take 2 puffs first thing in am and then another 2 puffs about 12 hours later.  . nystatin (MYCOSTATIN/NYSTOP) 100000 UNIT/GM POWD   . nystatin-triamcinolone ointment ([place])   . pantoprazole (PROTONIX) 40 MG tablet TAKE 1 TABLET (40 MG TOTAL) BY MOUTH DAILY. TAKE 30-60 MIN BEFORE FIRST MEAL OF THE DAY  . valsartan (DIOVAN) 320 MG tablet TAKE 1 TABLET (320 MG TOTAL) BY MOUTH DAILY.  . VOLTAREN 1 %  GEL Apply 2 g topically 4 (four) times daily.   . [DISCONTINUED] mometasone-formoterol (DULERA) 100-5 MCG/ACT AERO Take 2 puffs first thing in am and then another 2 puffs about 12 hours later.  . [DISCONTINUED] cetirizine (ZYRTEC) 10 MG tablet Take 1 tablet (10 mg total) by mouth daily. (Patient not taking: Reported on 06/19/2016)   No facility-administered encounter medications on file as of 06/19/2016.

## 2016-06-19 NOTE — Assessment & Plan Note (Signed)
Body mass index is 49.76    Lab Results  Component Value Date   TSH 1.24 03/27/2013     Contributing to gerd tendency/ doe/reviewed the need and the process to achieve and maintain neg calorie balance > defer f/u primary care including intermittently monitoring thyroid status

## 2016-06-19 NOTE — Patient Instructions (Signed)
Ok to try pepcid 20 mg after bfast and also after supper (or at bedtime) to see if it doesn't work as well   Plan A = Automatic =  dulera 100 Take 2 puffs first thing in am and then another 2 puffs about 12 hours later.   Plan B = Backup Only use your albuterol (proair) as a rescue medication to be used if you can't catch your breath by resting or doing a relaxed purse lip breathing pattern.  - The less you use it, the better it will work when you need it. - Ok to use the inhaler up to 2 puffs  every 4 hours if you must but call for appointment if use goes up over your usual need - Don't leave home without it !!  (think of it like the spare tire for your car)       If you are satisfied with your treatment plan,  let your doctor know and he/she can either refill your medications or you can return here when your prescription runs out.     If in any way you are not 100% satisfied,  please tell us.  If 100% better, tell your friends!  Pulmonary follow up is as needed

## 2016-06-25 ENCOUNTER — Other Ambulatory Visit: Payer: Self-pay | Admitting: Internal Medicine

## 2016-07-02 DIAGNOSIS — J45998 Other asthma: Secondary | ICD-10-CM | POA: Diagnosis not present

## 2016-07-02 DIAGNOSIS — R0602 Shortness of breath: Secondary | ICD-10-CM | POA: Diagnosis not present

## 2016-08-02 DIAGNOSIS — J45998 Other asthma: Secondary | ICD-10-CM | POA: Diagnosis not present

## 2016-08-02 DIAGNOSIS — R0602 Shortness of breath: Secondary | ICD-10-CM | POA: Diagnosis not present

## 2016-09-01 DIAGNOSIS — J45998 Other asthma: Secondary | ICD-10-CM | POA: Diagnosis not present

## 2016-09-01 DIAGNOSIS — R0602 Shortness of breath: Secondary | ICD-10-CM | POA: Diagnosis not present

## 2016-09-10 ENCOUNTER — Encounter: Payer: Self-pay | Admitting: Internal Medicine

## 2016-09-10 ENCOUNTER — Ambulatory Visit (INDEPENDENT_AMBULATORY_CARE_PROVIDER_SITE_OTHER): Payer: Medicare Other | Admitting: Internal Medicine

## 2016-09-10 VITALS — BP 170/60 | HR 61 | Temp 98.0°F | Resp 20 | Ht <= 58 in | Wt 232.0 lb

## 2016-09-10 DIAGNOSIS — M159 Polyosteoarthritis, unspecified: Secondary | ICD-10-CM

## 2016-09-10 DIAGNOSIS — Z0000 Encounter for general adult medical examination without abnormal findings: Secondary | ICD-10-CM

## 2016-09-10 DIAGNOSIS — E785 Hyperlipidemia, unspecified: Secondary | ICD-10-CM | POA: Diagnosis not present

## 2016-09-10 DIAGNOSIS — J4520 Mild intermittent asthma, uncomplicated: Secondary | ICD-10-CM

## 2016-09-10 DIAGNOSIS — M150 Primary generalized (osteo)arthritis: Secondary | ICD-10-CM

## 2016-09-10 DIAGNOSIS — I10 Essential (primary) hypertension: Secondary | ICD-10-CM

## 2016-09-10 DIAGNOSIS — M8949 Other hypertrophic osteoarthropathy, multiple sites: Secondary | ICD-10-CM

## 2016-09-10 MED ORDER — HYDROCODONE-ACETAMINOPHEN 5-325 MG PO TABS: 1.0000 | ORAL_TABLET | Freq: Every day | ORAL | 0 refills | Status: DC | PRN

## 2016-09-10 NOTE — Assessment & Plan Note (Signed)
BP above goal and did not take lipitor due to side effects. We talked about the decrease to her risk of heart disease and stroke and she does not want to try any medication at this time.

## 2016-09-10 NOTE — Assessment & Plan Note (Signed)
Needs refills but no flare today.

## 2016-09-10 NOTE — Progress Notes (Signed)
   Subjective:    Patient ID: Franchot Mimes, female    DOB: Feb 24, 1941, 75 y.o.   MRN: [id number]  HPI Here for medicare wellness and CPE, no new complaints. Please see A/P for status and treatment of chronic medical problems. Dr Raphael Gibney dexa (gyn)  Diet: poor Physical activity: sedentary, no exercise Depression/mood screen: negative Hearing: intact to whispered voice, mild loss Visual acuity: grossly normal, performs annual eye exam January was last ADLs: capable Fall risk: low, uses cane Home safety: good, does not drive anymore Cognitive evaluation: intact to orientation, naming, recall and repetition EOL planning: adv directives discussed  I have personally reviewed and have noted 1. The patient's medical and social history - reviewed today no changes 2. Their use of alcohol, tobacco or illicit drugs 3. Their current medications and supplements 4. The patient's functional ability including ADL's, fall risks, home safety risks and hearing or visual impairment. 5. Diet and physical activities 6. Evidence for depression or mood disorders 7. Care team reviewed and updated (available in snapshot)  Review of Systems  Constitutional: Negative for activity change, appetite change, diaphoresis, fatigue and fever.  HENT: Negative.   Eyes: Negative.   Respiratory: Negative.   Cardiovascular: Negative.   Gastrointestinal: Negative.   Musculoskeletal: Positive for arthralgias and gait problem. Negative for back pain, joint swelling and myalgias.  Skin: Negative.   Neurological: Negative for dizziness, seizures, facial asymmetry, speech difficulty and numbness.  Psychiatric/Behavioral: Negative.       Objective:   Physical Exam  Constitutional: She is oriented to person, place, and time. She appears well-developed and well-nourished.  HENT:  Head: Normocephalic and atraumatic.  Nose: Nose normal.  Mouth/Throat: Oropharynx is clear and moist.  Eyes: EOM are normal.  Neck:  Normal range of motion.  Cardiovascular: Normal rate and regular rhythm.   Pulmonary/Chest: Effort normal and breath sounds normal. No respiratory distress. She has no wheezes. She has no rales.  Abdominal: Soft. Bowel sounds are normal. She exhibits no distension. There is no tenderness. There is no rebound.  Musculoskeletal: She exhibits no edema.  Neurological: She is alert and oriented to person, place, and time. Coordination normal.  Skin: Skin is warm and dry.  Psychiatric: She has a normal mood and affect.   Vitals:   09/10/16 1305 09/10/16 1340  BP: (!) 168/64 (!) 170/60  Pulse: 61   Resp: 20   Temp: 98 F (36.7 C)   SpO2: 98%   Weight: 232 lb (105.2 kg)   Height: 4\' 9"  (1.448 m)       Assessment & Plan:

## 2016-09-10 NOTE — Progress Notes (Signed)
Pre visit review using our clinic review tool, if applicable. No additional management support is needed unless otherwise documented below in the visit note. 

## 2016-09-10 NOTE — Assessment & Plan Note (Signed)
BP above goal and she elects to monitor and adjust next time if needed. Taking hydralazine, metoprolol, valsartan for BP.

## 2016-09-10 NOTE — Assessment & Plan Note (Signed)
She is still working on weight loss and not having success.

## 2016-09-10 NOTE — Assessment & Plan Note (Signed)
Handicapped sticker filled out today and rx for lift chair to aid in her mobility and safety.

## 2016-09-10 NOTE — Patient Instructions (Signed)
We have given you the prescription for the lift chair that you can call advanced to help you with. Their number is [phone]. [phone]  We have also filled out the papers for the parking pass for you.   Health Maintenance, Female Adopting a healthy lifestyle and getting preventive care can go a long way to promote health and wellness. Talk with your health care provider about what schedule of regular examinations is right for you. This is a good chance for you to check in with your provider about disease prevention and staying healthy. In between checkups, there are plenty of things you can do on your own. Experts have done a lot of research about which lifestyle changes and preventive measures are most likely to keep you healthy. Ask your health care provider for more information. WEIGHT AND DIET  Eat a healthy diet  Be sure to include plenty of vegetables, fruits, low-fat dairy products, and lean protein.  Do not eat a lot of foods high in solid fats, added sugars, or salt.  Get regular exercise. This is one of the most important things you can do for your health.  Most adults should exercise for at least 150 minutes each week. The exercise should increase your heart rate and make you sweat (moderate-intensity exercise).  Most adults should also do strengthening exercises at least twice a week. This is in addition to the moderate-intensity exercise.  Maintain a healthy weight  Body mass index (BMI) is a measurement that can be used to identify possible weight problems. It estimates body fat based on height and weight. Your health care provider can help determine your BMI and help you achieve or maintain a healthy weight.  For females 58 years of age and older:   A BMI below 18.5 is considered underweight.  A BMI of 18.5 to 24.9 is normal.  A BMI of 25 to 29.9 is considered overweight.  A BMI of 30 and above is considered obese.  Watch levels of cholesterol and blood  lipids  You should start having your blood tested for lipids and cholesterol at 73 years of age, then have this test every 5 years.  You may need to have your cholesterol levels checked more often if:  Your lipid or cholesterol levels are high.  You are older than 73 years of age.  You are at high risk for heart disease.  CANCER SCREENING   Lung Cancer  Lung cancer screening is recommended for adults 56-28 years old who are at high risk for lung cancer because of a history of smoking.  A yearly low-dose CT scan of the lungs is recommended for people who:  Currently smoke.  Have quit within the past 15 years.  Have at least a 30-pack-year history of smoking. A pack year is smoking an average of one pack of cigarettes a day for 1 year.  Yearly screening should continue until it has been 15 years since you quit.  Yearly screening should stop if you develop a health problem that would prevent you from having lung cancer treatment.  Breast Cancer  Practice breast self-awareness. This means understanding how your breasts normally appear and feel.  It also means doing regular breast self-exams. Let your health care provider know about any changes, no matter how small.  If you are in your 20s or 30s, you should have a clinical breast exam (CBE) by a health care provider every 1-3 years as part of a regular health exam.  If you are 40 or older, have a CBE every year. Also consider having a breast X-ray (mammogram) every year.  If you have a family history of breast cancer, talk to your health care provider about genetic screening.  If you are at high risk for breast cancer, talk to your health care provider about having an MRI and a mammogram every year.  Breast cancer gene (BRCA) assessment is recommended for women who have family members with BRCA-related cancers. BRCA-related cancers include:  Breast.  Ovarian.  Tubal.  Peritoneal cancers.  Results of the assessment  will determine the need for genetic counseling and BRCA1 and BRCA2 testing. Cervical Cancer Your health care provider may recommend that you be screened regularly for cancer of the pelvic organs (ovaries, uterus, and vagina). This screening involves a pelvic examination, including checking for microscopic changes to the surface of your cervix (Pap test). You may be encouraged to have this screening done every 3 years, beginning at age 53.  For women ages 74-65, health care providers may recommend pelvic exams and Pap testing every 3 years, or they may recommend the Pap and pelvic exam, combined with testing for human papilloma virus (HPV), every 5 years. Some types of HPV increase your risk of cervical cancer. Testing for HPV may also be done on women of any age with unclear Pap test results.  Other health care providers may not recommend any screening for nonpregnant women who are considered low risk for pelvic cancer and who do not have symptoms. Ask your health care provider if a screening pelvic exam is right for you.  If you have had past treatment for cervical cancer or a condition that could lead to cancer, you need Pap tests and screening for cancer for at least 20 years after your treatment. If Pap tests have been discontinued, your risk factors (such as having a new sexual partner) need to be reassessed to determine if screening should resume. Some women have medical problems that increase the chance of getting cervical cancer. In these cases, your health care provider may recommend more frequent screening and Pap tests. Colorectal Cancer  This type of cancer can be detected and often prevented.  Routine colorectal cancer screening usually begins at 73 years of age and continues through 73 years of age.  Your health care provider may recommend screening at an earlier age if you have risk factors for colon cancer.  Your health care provider may also recommend using home test kits to check  for hidden blood in the stool.  A small camera at the end of a tube can be used to examine your colon directly (sigmoidoscopy or colonoscopy). This is done to check for the earliest forms of colorectal cancer.  Routine screening usually begins at age 20.  Direct examination of the colon should be repeated every 5-10 years through 73 years of age. However, you may need to be screened more often if early forms of precancerous polyps or small growths are found. Skin Cancer  Check your skin from head to toe regularly.  Tell your health care provider about any new moles or changes in moles, especially if there is a change in a mole's shape or color.  Also tell your health care provider if you have a mole that is larger than the size of a pencil eraser.  Always use sunscreen. Apply sunscreen liberally and repeatedly throughout the day.  Protect yourself by wearing long sleeves, pants, a wide-brimmed hat, and sunglasses whenever you  are outside. HEART DISEASE, DIABETES, AND HIGH BLOOD PRESSURE   High blood pressure causes heart disease and increases the risk of stroke. High blood pressure is more likely to develop in:  People who have blood pressure in the high end of the normal range (130-139/85-89 mm Hg).  People who are overweight or obese.  People who are African American.  If you are 32-12 years of age, have your blood pressure checked every 3-5 years. If you are 41 years of age or older, have your blood pressure checked every year. You should have your blood pressure measured twice--once when you are at a hospital or clinic, and once when you are not at a hospital or clinic. Record the average of the two measurements. To check your blood pressure when you are not at a hospital or clinic, you can use:  An automated blood pressure machine at a pharmacy.  A home blood pressure monitor.  If you are between 64 years and 73 years old, ask your health care provider if you should take  aspirin to prevent strokes.  Have regular diabetes screenings. This involves taking a blood sample to check your fasting blood sugar level.  If you are at a normal weight and have a low risk for diabetes, have this test once every three years after 73 years of age.  If you are overweight and have a high risk for diabetes, consider being tested at a younger age or more often. PREVENTING INFECTION  Hepatitis B  If you have a higher risk for hepatitis B, you should be screened for this virus. You are considered at high risk for hepatitis B if:  You were born in a country where hepatitis B is common. Ask your health care provider which countries are considered high risk.  Your parents were born in a high-risk country, and you have not been immunized against hepatitis B (hepatitis B vaccine).  You have HIV or AIDS.  You use needles to inject street drugs.  You live with someone who has hepatitis B.  You have had sex with someone who has hepatitis B.  You get hemodialysis treatment.  You take certain medicines for conditions, including cancer, organ transplantation, and autoimmune conditions. Hepatitis C  Blood testing is recommended for:  Everyone born from [birth year] through 1963.  Anyone with known risk factors for hepatitis C. Sexually transmitted infections (STIs)  You should be screened for sexually transmitted infections (STIs) including gonorrhea and chlamydia if:  You are sexually active and are younger than 73 years of age.  You are older than 73 years of age and your health care provider tells you that you are at risk for this type of infection.  Your sexual activity has changed since you were last screened and you are at an increased risk for chlamydia or gonorrhea. Ask your health care provider if you are at risk.  If you do not have HIV, but are at risk, it may be recommended that you take a prescription medicine daily to prevent HIV infection. This is called  pre-exposure prophylaxis (PrEP). You are considered at risk if:  You are sexually active and do not regularly use condoms or know the HIV status of your partner(s).  You take drugs by injection.  You are sexually active with a partner who has HIV. Talk with your health care provider about whether you are at high risk of being infected with HIV. If you choose to begin PrEP, you should first be tested for  HIV. You should then be tested every 3 months for as long as you are taking PrEP.  PREGNANCY   If you are premenopausal and you may become pregnant, ask your health care provider about preconception counseling.  If you may become pregnant, take 400 to 800 micrograms (mcg) of folic acid every day.  If you want to prevent pregnancy, talk to your health care provider about birth control (contraception). OSTEOPOROSIS AND MENOPAUSE   Osteoporosis is a disease in which the bones lose minerals and strength with aging. This can result in serious bone fractures. Your risk for osteoporosis can be identified using a bone density scan.  If you are 27 years of age or older, or if you are at risk for osteoporosis and fractures, ask your health care provider if you should be screened.  Ask your health care provider whether you should take a calcium or vitamin D supplement to lower your risk for osteoporosis.  Menopause may have certain physical symptoms and risks.  Hormone replacement therapy may reduce some of these symptoms and risks. Talk to your health care provider about whether hormone replacement therapy is right for you.  HOME CARE INSTRUCTIONS   Schedule regular health, dental, and eye exams.  Stay current with your immunizations.   Do not use any tobacco products including cigarettes, chewing tobacco, or electronic cigarettes.  If you are pregnant, do not drink alcohol.  If you are breastfeeding, limit how much and how often you drink alcohol.  Limit alcohol intake to no more than 1  drink per day for nonpregnant women. One drink equals 12 ounces of beer, 5 ounces of wine, or 1 ounces of hard liquor.  Do not use street drugs.  Do not share needles.  Ask your health care provider for help if you need support or information about quitting drugs.  Tell your health care provider if you often feel depressed.  Tell your health care provider if you have ever been abused or do not feel safe at home.   This information is not intended to replace advice given to you by your health care provider. Make sure you discuss any questions you have with your health care provider.   Document Released: 06/01/2011 Document Revised: 12/07/2014 Document Reviewed: 10/18/2013 Elsevier Interactive Patient Education [employer].

## 2016-09-10 NOTE — Assessment & Plan Note (Signed)
Declines flu shot. Declines colon cancer screening today but does yearly FOBT with her health insurance. Shingles done and pneumonia series complete. Counseled on home safety and fall prevention. Counseled on sun safety and mole surveillance. Given 10 year screening recommendations.

## 2016-09-19 ENCOUNTER — Other Ambulatory Visit: Payer: Self-pay | Admitting: Internal Medicine

## 2016-09-23 ENCOUNTER — Other Ambulatory Visit: Payer: Self-pay | Admitting: Internal Medicine

## 2016-09-23 DIAGNOSIS — J45901 Unspecified asthma with (acute) exacerbation: Secondary | ICD-10-CM

## 2016-09-23 MED ORDER — ALBUTEROL SULFATE HFA 108 (90 BASE) MCG/ACT IN AERS
2.0000 | INHALATION_SPRAY | Freq: Four times a day (QID) | RESPIRATORY_TRACT | 2 refills | Status: DC | PRN
Start: 2016-09-23 — End: 2017-10-18

## 2016-09-29 DIAGNOSIS — Z01411 Encounter for gynecological examination (general) (routine) with abnormal findings: Secondary | ICD-10-CM | POA: Diagnosis not present

## 2016-09-29 DIAGNOSIS — Z124 Encounter for screening for malignant neoplasm of cervix: Secondary | ICD-10-CM | POA: Diagnosis not present

## 2016-09-29 DIAGNOSIS — R21 Rash and other nonspecific skin eruption: Secondary | ICD-10-CM | POA: Diagnosis not present

## 2016-09-29 DIAGNOSIS — B372 Candidiasis of skin and nail: Secondary | ICD-10-CM | POA: Diagnosis not present

## 2016-10-02 DIAGNOSIS — R0602 Shortness of breath: Secondary | ICD-10-CM | POA: Diagnosis not present

## 2016-10-02 DIAGNOSIS — J45998 Other asthma: Secondary | ICD-10-CM | POA: Diagnosis not present

## 2016-11-01 DIAGNOSIS — J45998 Other asthma: Secondary | ICD-10-CM | POA: Diagnosis not present

## 2016-11-01 DIAGNOSIS — R0602 Shortness of breath: Secondary | ICD-10-CM | POA: Diagnosis not present

## 2016-11-09 ENCOUNTER — Other Ambulatory Visit: Payer: Self-pay | Admitting: Internal Medicine

## 2016-11-26 DIAGNOSIS — Z1231 Encounter for screening mammogram for malignant neoplasm of breast: Secondary | ICD-10-CM | POA: Diagnosis not present

## 2016-12-02 DIAGNOSIS — R0602 Shortness of breath: Secondary | ICD-10-CM | POA: Diagnosis not present

## 2016-12-02 DIAGNOSIS — J45998 Other asthma: Secondary | ICD-10-CM | POA: Diagnosis not present

## 2016-12-04 ENCOUNTER — Other Ambulatory Visit (INDEPENDENT_AMBULATORY_CARE_PROVIDER_SITE_OTHER): Payer: Medicare Other

## 2016-12-04 ENCOUNTER — Ambulatory Visit (INDEPENDENT_AMBULATORY_CARE_PROVIDER_SITE_OTHER): Payer: Medicare Other | Admitting: Internal Medicine

## 2016-12-04 VITALS — BP 170/89 | HR 67 | Temp 97.4°F | Resp 16 | Ht <= 58 in | Wt 229.2 lb

## 2016-12-04 DIAGNOSIS — I10 Essential (primary) hypertension: Secondary | ICD-10-CM | POA: Diagnosis not present

## 2016-12-04 DIAGNOSIS — I872 Venous insufficiency (chronic) (peripheral): Secondary | ICD-10-CM | POA: Diagnosis not present

## 2016-12-04 LAB — LIPID PANEL
Cholesterol: 231 mg/dL — ABNORMAL HIGH (ref 0–200)
HDL: 45.3 mg/dL (ref >39.00)
LDL Cholesterol: 156 mg/dL — ABNORMAL HIGH (ref 0–99)
NonHDL: 185.69
Total CHOL/HDL Ratio: 5
Triglycerides: 148 mg/dL (ref 0.0–149.0)
VLDL: 29.6 mg/dL (ref 0.0–40.0)

## 2016-12-04 LAB — CBC
HCT: 39.6 % (ref 36.0–46.0)
Hemoglobin: 13.4 g/dL (ref 12.0–15.0)
MCHC: 33.8 g/dL (ref 30.0–36.0)
MCV: 92.2 fl (ref 78.0–100.0)
Platelets: 344 10*3/uL (ref 150.0–400.0)
RBC: 4.29 Mil/uL (ref 3.87–5.11)
RDW: 12.7 % (ref 11.5–15.5)
WBC: 7.7 10*3/uL (ref 4.0–10.5)

## 2016-12-04 LAB — COMPREHENSIVE METABOLIC PANEL
ALT: 10 U/L (ref 0–35)
AST: 11 U/L (ref 0–37)
Albumin: 4.3 g/dL (ref 3.5–5.2)
Alkaline Phosphatase: 58 U/L (ref 39–117)
BUN: 12 mg/dL (ref 6–23)
CO2: 30 mEq/L (ref 19–32)
Calcium: 9.8 mg/dL (ref 8.4–10.5)
Chloride: 108 mEq/L (ref 96–112)
Creatinine, Ser: 1.24 mg/dL — ABNORMAL HIGH (ref 0.40–1.20)
GFR: 54.41 mL/min — ABNORMAL LOW (ref >60.00)
Glucose, Bld: 94 mg/dL (ref 70–99)
Potassium: 3.9 mEq/L (ref 3.5–5.1)
Sodium: 143 mEq/L (ref 135–145)
Total Bilirubin: 0.6 mg/dL (ref 0.2–1.2)
Total Protein: 6.9 g/dL (ref 6.0–8.3)

## 2016-12-04 LAB — BRAIN NATRIURETIC PEPTIDE: Pro B Natriuretic peptide (BNP): 164 pg/mL — ABNORMAL HIGH (ref 0.0–100.0)

## 2016-12-04 MED ORDER — HYDROCODONE-ACETAMINOPHEN 5-325 MG PO TABS
1.0000 | ORAL_TABLET | Freq: Every day | ORAL | 0 refills | Status: DC | PRN
Start: 2016-12-04 — End: 2017-03-16

## 2016-12-04 NOTE — Progress Notes (Signed)
   Subjective:    Patient ID: Heather Grant, female    DOB: [birth year], 76 y.o.   MRN: [id number]  HPI The patient is a 76 YO female coming in for foot swelling. Some dietary changes over the holidays. She is still taking her blood pressure medications and has not missed doses of these. No injury or overuse to her feet or legs. Swelling worsens as the day goes on. Mild soreness but not severe enough to take anything for. She is not elevating her legs.   Review of Systems  Constitutional: Negative for activity change, appetite change, fatigue, fever and unexpected weight change.  Respiratory: Negative.   Cardiovascular: Positive for leg swelling. Negative for chest pain and palpitations.  Gastrointestinal: Negative.   Musculoskeletal: Negative.   Skin: Negative.   Neurological: Negative.       Objective:   Physical Exam  Constitutional: She is oriented to person, place, and time. She appears well-developed and well-nourished.  Obese  HENT:  Head: Normocephalic and atraumatic.  Eyes: EOM are normal.  Cardiovascular: Normal rate and regular rhythm.   Pulmonary/Chest: Effort normal. No respiratory distress. She has no wheezes. She has no rales.  Abdominal: Soft. She exhibits no distension. There is no tenderness. There is no rebound.  Musculoskeletal:  Trace edema in the ankles non-pitting.   Neurological: She is alert and oriented to person, place, and time. Coordination normal.  Skin: Skin is warm and dry.   Vitals:   12/04/16 1438 12/04/16 1502  BP: (!) 180/110 (!) 170/89  Pulse: 67   Resp: 16   Temp: 97.4 F (36.3 C)   TempSrc: Oral   SpO2: 98%   Weight: 229 lb 4 oz (104 kg)   Height: 4\' 9"  (1.448 m)       Assessment & Plan:

## 2016-12-04 NOTE — Assessment & Plan Note (Signed)
No meds yet before office visit today but prior under much better control.

## 2016-12-04 NOTE — Assessment & Plan Note (Signed)
Checking CMP and BNP. Prescription for the compression stockings today and advised to elevate her legs and monitor her sodium intake.

## 2016-12-04 NOTE — Patient Instructions (Signed)
We have given you the prescription for the compression stockings.   We will check the labs today and call you back with the results looking for another cause.    Venous Stasis or Chronic Venous Insufficiency Chronic venous insufficiency, also called venous stasis, is a condition that affects the veins in the legs. The condition prevents blood from being pumped through these veins effectively. Blood may no longer be pumped effectively from the legs back to the heart. This condition can range from mild to severe. With proper treatment, you should be able to continue with an active life. CAUSES  Chronic venous insufficiency occurs when the vein walls become stretched, weakened, or damaged or when valves within the vein are damaged. Some common causes of this include:  High blood pressure inside the veins (venous hypertension).  Increased blood pressure in the leg veins from long periods of sitting or standing.  A blood clot that blocks blood flow in a vein (deep vein thrombosis).  Inflammation of a superficial vein (phlebitis) that causes a blood clot to form. RISK FACTORS Various things can make you more likely to develop chronic venous insufficiency, including:  Family history of this condition.  Obesity.  Pregnancy.  Sedentary lifestyle.  Smoking.  Jobs requiring long periods of standing or sitting in one place.  Being a certain age. Women in their 86s and 59s and men in their 93s are more likely to develop this condition. SIGNS AND SYMPTOMS  Symptoms may include:   Varicose veins.  Skin breakdown or ulcers.  Reddened or discolored skin on the leg.  Brown, smooth, tight, and painful skin just above the ankle, usually on the inside surface (lipodermatosclerosis).  Swelling. DIAGNOSIS  To diagnose this condition, your health care provider will take a medical history and do a physical exam. The following tests may be ordered to confirm the diagnosis:  Duplex ultrasound-A  procedure that produces a picture of a blood vessel and nearby organs and also provides information on blood flow through the blood vessel.  Plethysmography-A procedure that tests blood flow.  A venogram, or venography-A procedure used to look at the veins using X-ray and dye. TREATMENT The goals of treatment are to help you return to an active life and to minimize pain or disability. Treatment will depend on the severity of the condition. Medical procedures may be needed for severe cases. Treatment options may include:   Use of compression stockings. These can help with symptoms and lower the chances of the problem getting worse, but they do not cure the problem.  Sclerotherapy-A procedure involving an injection of a material that "dissolves" the damaged veins. Other veins in the network of blood vessels take over the function of the damaged veins.  Surgery to remove the vein or cut off blood flow through the vein (vein stripping or laser ablation surgery).  Surgery to repair a valve. HOME CARE INSTRUCTIONS   Wear compression stockings as directed by your health care provider.  Only take over-the-counter or prescription medicines for pain, discomfort, or fever as directed by your health care provider.  Follow up with your health care provider as directed. SEEK MEDICAL CARE IF:   You have redness, swelling, or increasing pain in the affected area.  You see a red streak or line that extends up or down from the affected area.  You have a breakdown or loss of skin in the affected area, even if the breakdown is small.  You have an injury to the affected area.  SEEK IMMEDIATE MEDICAL CARE IF:   You have an injury and open wound in the affected area.  Your pain is severe and does not improve with medicine.  You have sudden numbness or weakness in the foot or ankle below the affected area, or you have trouble moving your foot or ankle.  You have a fever or persistent symptoms for more  than 2-3 days.  You have a fever and your symptoms suddenly get worse. MAKE SURE YOU:   Understand these instructions.  Will watch your condition.  Will get help right away if you are not doing well or get worse. This information is not intended to replace advice given to you by your health care provider. Make sure you discuss any questions you have with your health care provider. Document Released: 03/22/2007 Document Revised: 09/06/2013 Document Reviewed: 07/24/2013 Elsevier Interactive Patient Education  2017 [employer].

## 2016-12-04 NOTE — Progress Notes (Signed)
Pre visit review using our clinic review tool, if applicable. No additional management support is needed unless otherwise documented below in the visit note. 

## 2016-12-14 ENCOUNTER — Other Ambulatory Visit: Payer: Self-pay | Admitting: Internal Medicine

## 2017-01-02 DIAGNOSIS — R0602 Shortness of breath: Secondary | ICD-10-CM | POA: Diagnosis not present

## 2017-01-02 DIAGNOSIS — J45998 Other asthma: Secondary | ICD-10-CM | POA: Diagnosis not present

## 2017-01-30 DIAGNOSIS — R0602 Shortness of breath: Secondary | ICD-10-CM | POA: Diagnosis not present

## 2017-01-30 DIAGNOSIS — J45998 Other asthma: Secondary | ICD-10-CM | POA: Diagnosis not present

## 2017-02-11 ENCOUNTER — Other Ambulatory Visit: Payer: Self-pay | Admitting: Internal Medicine

## 2017-02-25 DIAGNOSIS — Z961 Presence of intraocular lens: Secondary | ICD-10-CM | POA: Diagnosis not present

## 2017-02-25 DIAGNOSIS — H04123 Dry eye syndrome of bilateral lacrimal glands: Secondary | ICD-10-CM | POA: Diagnosis not present

## 2017-02-25 DIAGNOSIS — H43813 Vitreous degeneration, bilateral: Secondary | ICD-10-CM | POA: Diagnosis not present

## 2017-02-25 DIAGNOSIS — H52202 Unspecified astigmatism, left eye: Secondary | ICD-10-CM | POA: Diagnosis not present

## 2017-02-27 ENCOUNTER — Other Ambulatory Visit: Payer: Self-pay | Admitting: Internal Medicine

## 2017-03-02 DIAGNOSIS — J45998 Other asthma: Secondary | ICD-10-CM | POA: Diagnosis not present

## 2017-03-02 DIAGNOSIS — R0602 Shortness of breath: Secondary | ICD-10-CM | POA: Diagnosis not present

## 2017-03-10 DIAGNOSIS — M1711 Unilateral primary osteoarthritis, right knee: Secondary | ICD-10-CM | POA: Diagnosis not present

## 2017-03-10 DIAGNOSIS — M1712 Unilateral primary osteoarthritis, left knee: Secondary | ICD-10-CM | POA: Diagnosis not present

## 2017-03-10 DIAGNOSIS — M170 Bilateral primary osteoarthritis of knee: Secondary | ICD-10-CM | POA: Diagnosis not present

## 2017-03-16 ENCOUNTER — Telehealth: Payer: Self-pay | Admitting: Internal Medicine

## 2017-03-16 MED ORDER — HYDROCODONE-ACETAMINOPHEN 5-325 MG PO TABS: 1.0000 | ORAL_TABLET | Freq: Every day | ORAL | 0 refills | Status: DC | PRN

## 2017-03-16 NOTE — Telephone Encounter (Signed)
Pt would like refill of HYDROcodone-acetaminophen (NORCO/VICODIN) 5-325 MG

## 2017-03-16 NOTE — Telephone Encounter (Signed)
Patient aware script is ready to be picked up

## 2017-03-16 NOTE — Telephone Encounter (Signed)
Filled but quantity decreased as 30 is a 3 month supply and we cannot write 3 month supply of controlled substances.

## 2017-04-01 ENCOUNTER — Encounter: Payer: Self-pay | Admitting: Internal Medicine

## 2017-04-01 ENCOUNTER — Ambulatory Visit (INDEPENDENT_AMBULATORY_CARE_PROVIDER_SITE_OTHER): Payer: Medicare Other | Admitting: Internal Medicine

## 2017-04-01 DIAGNOSIS — M79671 Pain in right foot: Secondary | ICD-10-CM

## 2017-04-01 DIAGNOSIS — J45998 Other asthma: Secondary | ICD-10-CM | POA: Diagnosis not present

## 2017-04-01 DIAGNOSIS — R0602 Shortness of breath: Secondary | ICD-10-CM | POA: Diagnosis not present

## 2017-04-01 MED ORDER — MELOXICAM 15 MG PO TABS: 15.0000 mg | ORAL_TABLET | Freq: Every day | ORAL | 0 refills | Status: DC

## 2017-04-01 MED ORDER — TRIAMCINOLONE ACETONIDE 0.1 % EX CREA: 1.0000 "application " | TOPICAL_CREAM | Freq: Two times a day (BID) | CUTANEOUS | 0 refills | Status: DC

## 2017-04-01 NOTE — Progress Notes (Signed)
Pre visit review using our clinic review tool, if applicable. No additional management support is needed unless otherwise documented below in the visit note. 

## 2017-04-01 NOTE — Assessment & Plan Note (Signed)
Not typical for gout, possible strain versus plantar fasciitis. Rx for meloxicam short term and triamcinolone for the rash. No indication for x-ray today and advised to use support hose and sensible shoes.

## 2017-04-01 NOTE — Patient Instructions (Addendum)
We have sent in medicine for the pain which you can take daily for the pain called meloxicam.   We have also sent in the cream to use on the rash twice a day.   Plantar Fasciitis Rehab Ask your health care provider which exercises are safe for you. Do exercises exactly as told by your health care provider and adjust them as directed. It is normal to feel mild stretching, pulling, tightness, or discomfort as you do these exercises, but you should stop right away if you feel sudden pain or your pain gets worse. Do not begin these exercises until told by your health care provider. Stretching and range of motion exercises These exercises warm up your muscles and joints and improve the movement and flexibility of your foot. These exercises also help to relieve pain. Exercise A: Plantar fascia stretch   1. Sit with your left / right leg crossed over your opposite knee. 2. Hold your heel with one hand with that thumb near your arch. With your other hand, hold your toes and gently pull them back toward the top of your foot. You should feel a stretch on the bottom of your toes or your foot or both. 3. Hold this stretch for__________ seconds. 4. Slowly release your toes and return to the starting position. Repeat __________ times. Complete this exercise __________ times a day. Exercise B: Gastroc, standing   1. Stand with your hands against a wall. 2. Extend your left / right leg behind you, and bend your front knee slightly. 3. Keeping your heels on the floor and keeping your back knee straight, shift your weight toward the wall without arching your back. You should feel a gentle stretch in your left / right calf. 4. Hold this position for __________ seconds. Repeat __________ times. Complete this exercise __________ times a day. Exercise C: Soleus, standing  1. Stand with your hands against a wall. 2. Extend your left / right leg behind you, and bend your front knee slightly. 3. Keeping your heels  on the floor, bend your back knee and slightly shift your weight over the back leg. You should feel a gentle stretch deep in your calf. 4. Hold this position for __________ seconds. Repeat __________ times. Complete this exercise __________ times a day. Exercise D: Gastrocsoleus, standing  1. Stand with the ball of your left / right foot on a step. The ball of your foot is on the walking surface, right under your toes. 2. Keep your other foot firmly on the same step. 3. Hold onto the wall or a railing for balance. 4. Slowly lift your other foot, allowing your body weight to press your heel down over the edge of the step. You should feel a stretch in your left / right calf. 5. Hold this position for __________ seconds. 6. Return both feet to the step. 7. Repeat this exercise with a slight bend in your left / right knee. Repeat __________ times with your left / right knee straight and __________ times with your left / right knee bent. Complete this exercise __________ times a day. Balance exercise This exercise builds your balance and strength control of your arch to help take pressure off your plantar fascia. Exercise E: Single leg stand  1. Without shoes, stand near a railing or in a doorway. You may hold onto the railing or door frame as needed. 2. Stand on your left / right foot. Keep your big toe down on the floor and try to keep  your arch lifted. Do not let your foot roll inward. 3. Hold this position for __________ seconds. 4. If this exercise is too easy, you can try it with your eyes closed or while standing on a pillow. Repeat __________ times. Complete this exercise __________ times a day. This information is not intended to replace advice given to you by your health care provider. Make sure you discuss any questions you have with your health care provider. Document Released: 11/16/2005 Document Revised: 07/21/2016 Document Reviewed: 09/30/2015 Elsevier Interactive Patient Education   2017 [employer].

## 2017-04-01 NOTE — Progress Notes (Signed)
   Subjective:    Patient ID: Franchot Mimes, female    DOB: 03-25-1941, 76 y.o.   MRN: [id number]  HPI The patient is a 76 YO female coming in for redness on her feet. This started last Sunday and is painful. She denies injury or overuse. She denies new shoes or different activity. She was walking outdoors over the weekend. She does not remember any bite or injury. Rash showed up 1-2 days ago and is itching. She thought that it was gout and started taking colchicine which did not help. She has stopped taking that. Walking and prolonged standing make it worse. She tried voltaren gel on the area which did not help much. Overall stable since onset. She is doing slightly less activity. Ankles are swelling some but not more than usual.   Review of Systems  Constitutional: Positive for activity change. Negative for appetite change, fatigue, fever and unexpected weight change.  Respiratory: Negative.   Cardiovascular: Negative.   Gastrointestinal: Negative.   Musculoskeletal: Positive for arthralgias, joint swelling and myalgias. Negative for back pain and gait problem.  Skin: Negative.   Neurological: Negative.       Objective:   Physical Exam  Constitutional: She is oriented to person, place, and time. She appears well-developed and well-nourished.  HENT:  Head: Normocephalic and atraumatic.  Cardiovascular: Normal rate and regular rhythm.   Pulmonary/Chest: Effort normal. No respiratory distress. She has no wheezes. She has no rales.  Abdominal: Soft.  Musculoskeletal: She exhibits edema.  1+ non-pitting edema right ankle, mild soreness in the midfoot and ankle. Mild rash on the medial midfoot which is faint.   Neurological: She is alert and oriented to person, place, and time.  Skin: Skin is warm and dry.   Vitals:   04/01/17 0920  BP: 140/70  Pulse: 85  Resp: 16  Temp: 97.8 F (36.6 C)  TempSrc: Oral  SpO2: 96%  Weight: 220 lb (99.8 kg)  Height: 4\' 9"  (1.448 m)        Assessment & Plan:

## 2017-04-30 ENCOUNTER — Other Ambulatory Visit: Payer: Self-pay | Admitting: Internal Medicine

## 2017-05-02 DIAGNOSIS — J45998 Other asthma: Secondary | ICD-10-CM | POA: Diagnosis not present

## 2017-05-02 DIAGNOSIS — R0602 Shortness of breath: Secondary | ICD-10-CM | POA: Diagnosis not present

## 2017-05-06 ENCOUNTER — Other Ambulatory Visit: Payer: Self-pay | Admitting: Internal Medicine

## 2017-05-31 ENCOUNTER — Ambulatory Visit (INDEPENDENT_AMBULATORY_CARE_PROVIDER_SITE_OTHER): Payer: Medicare Other | Admitting: Internal Medicine

## 2017-05-31 ENCOUNTER — Telehealth: Payer: Self-pay | Admitting: *Deleted

## 2017-05-31 ENCOUNTER — Encounter: Payer: Self-pay | Admitting: Internal Medicine

## 2017-05-31 VITALS — BP 150/70 | HR 62 | Ht <= 58 in | Wt 218.0 lb

## 2017-05-31 DIAGNOSIS — R05 Cough: Secondary | ICD-10-CM | POA: Diagnosis not present

## 2017-05-31 DIAGNOSIS — J4520 Mild intermittent asthma, uncomplicated: Secondary | ICD-10-CM

## 2017-05-31 DIAGNOSIS — R058 Other specified cough: Secondary | ICD-10-CM

## 2017-05-31 LAB — NITRIC OXIDE: Nitric Oxide: 9

## 2017-05-31 MED ORDER — HYDROCODONE-ACETAMINOPHEN 5-325 MG PO TABS: 1.0000 | ORAL_TABLET | Freq: Every day | ORAL | 0 refills | Status: DC | PRN

## 2017-05-31 NOTE — Progress Notes (Signed)
Subjective:    Patient ID: Heather Grant, female    DOB: 1941-02-28    MRN: [id number]   Brief patient profile:  18  yobf never smoker with ? New onset asthma in Dec 2013 referred 05/31/2013 to pulmonary clinic by Dr Linda Hedges - did have one bad sinus infection age 76 but no resp problems subsequently or need for any type of resp/ allergy med in interim.     History of Present Illness  05/31/2013 1st pulmonary eval cc acute onset cough Dec 2013   treated in ER but   but ever since then cc daily symptoms of coughing day > night assoc with sob and worse when move around and productive of white mucus and feels like something stuck in throat. Some better from inhalers despite very poor hfa, no better with last prednisone course. rec Pantoprazole (protonix) 40 mg   Take 30-60 min before first meal of the day and Pepcid 20 mg one bedtime until return to office - this is the best way to tell whether stomach acid is contributing to your problem.  GERD diet  dulera 100 2puffs every 12 hours if needed for cough or breathing     06/19/2016  f/u ov/[place] Hijazi re: asthma / dulera 200  1-2 bid   Chief Complaint  Patient presents with  . Follow-up    Breathing is overall doing well. She is using albuterol inhaler 2 x per wk on average.   Not limited by breathing from desired activities  But very sedentary due to wt. rec Ok to try pepcid 20 mg after bfast and also after supper (or at bedtime) to see if it doesn't work as well  Plan A = Automatic =  dulera 100 Take 2 puffs first thing in am and then another 2 puffs about 12 hours later.  Plan B = Backup Only use your albuterol (proair) as a rescue medication     05/31/2017  f/u ov/Richad Ramsay re: uacs vs asthma Chief Complaint  Patient presents with  . Follow-up    Breathing is unchanged. She is using her albuterol inhaler 4 x per wk on average.      only cc = minimal cough is day > noct/ dry and admits not using dulera consistently Not limited by breathing  from desired activities  But very sedendentary    No obvious day to day or daytime variability or assoc excess/ purulent sputum or mucus plugs or hemoptysis or cp or chest tightness, subjective wheeze or overt sinus or hb symptoms. No unusual exp hx or h/o childhood pna/ asthma or knowledge of premature birth.  Sleeping ok without nocturnal  or early am exacerbation  of respiratory  c/o's or need for noct saba. Also denies any obvious fluctuation of symptoms with weather or environmental changes or other aggravating or alleviating factors except as outlined above   Current Medications, Allergies, Complete Past Medical History, Past Surgical History, Family History, and Social History were reviewed in [facility] record.  ROS  The following are not active complaints unless bolded sore throat, dysphagia, dental problems, itching, sneezing,  nasal congestion or excess/ purulent secretions, ear ache,   fever, chills, sweats, unintended wt loss, classically pleuritic or exertional cp,  orthopnea pnd or leg swelling, presyncope, palpitations, abdominal pain, anorexia, nausea, vomiting, diarrhea  or change in bowel or bladder habits, change in stools or urine, dysuria,hematuria,  rash, arthralgias, visual complaints, headache, numbness, weakness or ataxia or problems with walking or coordination,  change in mood/affect or memory.                    Objective:   Physical Exam   Obese bf nad  / vital signs reviewed   - Note on arrival 02 sats  100% on RA      07/12/2013        262  > 03/15/2014 234 >  12/06/2014 235 > 05/06/2015 233 > 06/19/2016  230 > 05/31/2017  218     05/31/13 263 lb (119.296 kg)  05/01/13 263 lb 6.4 oz (119.477 kg)  03/27/13 266 lb 12 oz (120.997 kg)     HEENT: nl dentition,  and oropharynx. Nl external ear canals without cough reflex    NECK :  without JVD/Nodes/TM/ nl carotid upstrokes bilaterally   LUNGS: no acc muscle use, clear to A and P  bilaterally without cough on insp or exp maneuvers   CV:  RRR  no s3 or murmur or increase in P2, no edema   ABD:  soft and nontender with nl excursion in the supine position. No bruits or organomegaly, bowel sounds nl  MS:  warm without deformities, calf tenderness, cyanosis or clubbing  SKIN: warm and dry without lesions    NEURO:  alert, approp, no deficits         Assessment & Plan:     Outpatient Encounter Prescriptions as of 05/31/2017  Medication Sig  . albuterol (VENTOLIN HFA) 108 (90 Base) MCG/ACT inhaler Inhale 2 puffs into the lungs every 6 (six) hours as needed for wheezing.  Marland Kitchen allopurinol (ZYLOPRIM) 300 MG tablet TAKE 1 TABLET (300 MG TOTAL) BY MOUTH DAILY.  Marland Kitchen colchicine (COLCRYS) 0.6 MG tablet Take 1 tablet (0.6 mg total) by mouth daily. For gout flare  . famotidine (PEPCID) 20 MG tablet TAKE 1 TABLET (20 MG TOTAL) BY MOUTH AT BEDTIME.  . hydrALAZINE (APRESOLINE) 25 MG tablet TAKE 1 TABLET (25 MG TOTAL) BY MOUTH 3 (THREE) TIMES DAILY.  Marland Kitchen HYDROcodone-acetaminophen (NORCO/VICODIN) 5-325 MG tablet Take 1 tablet by mouth daily as needed for moderate pain. This is a 30 day supply.  . meloxicam (MOBIC) 15 MG tablet Take 1 tablet (15 mg total) by mouth daily.  . metoprolol tartrate (LOPRESSOR) 25 MG tablet TAKE 1 TABLET BY MOUTH TWO TIMES DAILY  . mometasone-formoterol (DULERA) 100-5 MCG/ACT AERO Take 2 puffs first thing in am and then another 2 puffs about 12 hours later.  . pantoprazole (PROTONIX) 40 MG tablet TAKE 1 TABLET (40 MG TOTAL) BY MOUTH DAILY. TAKE 30-60 MIN BEFORE FIRST MEAL OF THE DAY  . triamcinolone cream (KENALOG) 0.1 % Apply 1 application topically 2 (two) times daily.  . valsartan (DIOVAN) 320 MG tablet TAKE 1 TABLET BY MOUTH DAILY.  . VOLTAREN 1 % GEL Apply 2 g topically 4 (four) times daily.   . [DISCONTINUED] HYDROcodone-acetaminophen (NORCO/VICODIN) 5-325 MG tablet Take 1 tablet by mouth daily as needed for moderate pain. This is a 30 day supply.  .  [DISCONTINUED] nystatin-triamcinolone ointment (MYCOLOG)    No facility-administered encounter medications on file as of 05/31/2017.

## 2017-05-31 NOTE — Patient Instructions (Addendum)
Leave off dulera to see what difference it makes - restart at 2 pffs every 12 hours if get worse off it  Only use your albuterol as a rescue medication to be used if you can't catch your breath by resting or doing a relaxed purse lip breathing pattern.  - The less you use it, the better it will work when you need it. - Ok to use up to 2 puffs  every 4 hours if you must but call for immediate appointment if use goes up over your usual need - Don't leave home without it !!  (think of it like the spare tire for your car)    If you are satisfied with your treatment plan,  let your doctor know and he/she can either refill your medications or you can return here when your prescription runs out.     If in any way you are not 100% satisfied,  please tell us.  If 100% better, tell your friends!  Pulmonary follow up is as needed

## 2017-05-31 NOTE — Telephone Encounter (Signed)
[place] Controlled Substance reviewed with no irregularities. Medication refilled and printed.

## 2017-05-31 NOTE — Telephone Encounter (Signed)
Rec'd call pt requesting refill on her Hydrocodone. Pt states she has appt this afternoon up stairs if possible would like to pick rx up then since she have a ride. Pls advise...Johny Chess

## 2017-05-31 NOTE — Telephone Encounter (Signed)
Notified pt will leave rx up front for pick-up today...Heather Grant

## 2017-06-01 DIAGNOSIS — R0602 Shortness of breath: Secondary | ICD-10-CM | POA: Diagnosis not present

## 2017-06-01 DIAGNOSIS — J45998 Other asthma: Secondary | ICD-10-CM | POA: Diagnosis not present

## 2017-06-03 DIAGNOSIS — R05 Cough: Secondary | ICD-10-CM | POA: Insufficient documentation

## 2017-06-03 DIAGNOSIS — R058 Other specified cough: Secondary | ICD-10-CM | POA: Insufficient documentation

## 2017-06-03 NOTE — Assessment & Plan Note (Signed)
Body mass index is 47.17 kg/m.  -  trending down, encouraged Lab Results  Component Value Date   TSH 1.24 03/27/2013     Contributing to gerd risk/ doe/reviewed the need and the process to achieve and maintain neg calorie balance > defer f/u primary care including intermittently monitoring thyroid status

## 2017-06-03 NOTE — Assessment & Plan Note (Addendum)
-  PFT's 07/12/2013 wnl p last saba/laba dose > 5 days prior   - FENO 05/31/2017  =    9 - 05/31/2017 p extensive coaching HFA effectiveness =   90% > try off dulera as cough more typical of uacs     I had an extended final summary discussion with the patient reviewing all relevant studies completed to date and  lasting 15 to 20 minutes of a 25 minute visit on the following issues:    With feno so low and only dry day time throat clearing type cough, at this point it's just as likely dulera is hurting as helping as she has features of mostly uacs and in terms of asthma:  All goals of chronic asthma control met including optimal function and elimination of symptoms with minimal need for rescue therapy.  Contingencies discussed in full including contacting this office immediately if not controlling the symptoms using the rule of two's.     If cough persists or worsens off dulera, she should start it back more consistently then return here for further w/u if not satisfied.   Each maintenance medication was reviewed in detail including most importantly the difference between maintenance and as needed and under what circumstances the prns are to be used.  Please see AVS for specific  Instructions which are unique to this visit and I personally typed out  which were reviewed in detail in writing with the patient and a copy provided.

## 2017-06-03 NOTE — Assessment & Plan Note (Signed)
Upper airway cough syndrome (previously labeled PNDS) , is  so named because it's frequently impossible to sort out how much is  CR/sinusitis with freq throat clearing (which can be related to primary GERD)   vs  causing  secondary (" extra esophageal")  GERD from wide swings in gastric pressure that occur with throat clearing, often  promoting self use of mint and menthol lozenges that reduce the lower esophageal sphincter tone and exacerbate the problem further in a cyclical fashion.   These are the same pts (now being labeled as having "irritable larynx syndrome" by some cough centers) who not infrequently have a history of having failed to tolerate ace inhibitors,  dry powder inhalers or biphosphonates or report having atypical/extraesophageal reflux symptoms that don't respond to standard doses of PPI  and are easily confused as having aecopd or asthma flares by even experienced allergists/ pulmonologists (myself included).   rec continue diet/ gerd rx/ 1st gen h1 prn and f/u here if not better

## 2017-06-18 ENCOUNTER — Other Ambulatory Visit: Payer: Self-pay | Admitting: Internal Medicine

## 2017-07-02 DIAGNOSIS — J45998 Other asthma: Secondary | ICD-10-CM | POA: Diagnosis not present

## 2017-07-02 DIAGNOSIS — R0602 Shortness of breath: Secondary | ICD-10-CM | POA: Diagnosis not present

## 2017-07-08 ENCOUNTER — Ambulatory Visit (INDEPENDENT_AMBULATORY_CARE_PROVIDER_SITE_OTHER): Payer: Medicare Other | Admitting: Family Medicine

## 2017-07-08 ENCOUNTER — Encounter: Payer: Self-pay | Admitting: Family Medicine

## 2017-07-08 VITALS — BP 160/70 | HR 50 | Temp 97.6°F | Ht <= 58 in | Wt 219.0 lb

## 2017-07-08 DIAGNOSIS — R42 Dizziness and giddiness: Secondary | ICD-10-CM | POA: Insufficient documentation

## 2017-07-08 DIAGNOSIS — R55 Syncope and collapse: Secondary | ICD-10-CM | POA: Diagnosis not present

## 2017-07-08 DIAGNOSIS — I10 Essential (primary) hypertension: Secondary | ICD-10-CM

## 2017-07-08 MED ORDER — IRBESARTAN 300 MG PO TABS: 300.0000 mg | ORAL_TABLET | Freq: Every day | ORAL | 1 refills | Status: DC

## 2017-07-08 NOTE — Assessment & Plan Note (Signed)
She did have one episode of lightheadedness that seems to be orthostatic in nature. She feels much better today. She has a history of bradycardia and reviewed previous lab work and EKGs. - Monitor for now.

## 2017-07-08 NOTE — Patient Instructions (Addendum)
Thank you for coming in,   Please follow-up in 2-3 weeks since we changed your medication for your blood pressure. Please let us know if you feel lightheaded anymore   Please feel free to call with any questions or concerns at any time, at [phone]. --Dr. Raeford Razor
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
## 2017-07-08 NOTE — Assessment & Plan Note (Signed)
Switched from valsartan to irbesartan - Advised to follow-up in 2-3 weeks to have blood work performed.

## 2017-07-26 ENCOUNTER — Other Ambulatory Visit: Payer: Self-pay | Admitting: Internal Medicine

## 2017-08-02 DIAGNOSIS — R0602 Shortness of breath: Secondary | ICD-10-CM | POA: Diagnosis not present

## 2017-08-02 DIAGNOSIS — J45998 Other asthma: Secondary | ICD-10-CM | POA: Diagnosis not present

## 2017-08-03 DIAGNOSIS — M1712 Unilateral primary osteoarthritis, left knee: Secondary | ICD-10-CM | POA: Diagnosis not present

## 2017-08-03 DIAGNOSIS — M1711 Unilateral primary osteoarthritis, right knee: Secondary | ICD-10-CM | POA: Diagnosis not present

## 2017-08-03 DIAGNOSIS — M170 Bilateral primary osteoarthritis of knee: Secondary | ICD-10-CM | POA: Diagnosis not present

## 2017-08-21 ENCOUNTER — Other Ambulatory Visit: Payer: Self-pay | Admitting: Internal Medicine

## 2017-09-01 ENCOUNTER — Telehealth: Payer: Self-pay | Admitting: Internal Medicine

## 2017-09-01 ENCOUNTER — Other Ambulatory Visit: Payer: Self-pay

## 2017-09-01 DIAGNOSIS — R0602 Shortness of breath: Secondary | ICD-10-CM | POA: Diagnosis not present

## 2017-09-01 DIAGNOSIS — J45998 Other asthma: Secondary | ICD-10-CM | POA: Diagnosis not present

## 2017-09-01 MED ORDER — FAMOTIDINE 20 MG PO TABS: ORAL_TABLET | ORAL | 1 refills | Status: DC

## 2017-09-01 NOTE — Telephone Encounter (Signed)
Pt called requesting a refill on famotidine (PEPCID) 20 MG tablet sent to [facility] on [address].

## 2017-09-01 NOTE — Telephone Encounter (Signed)
Faxed to pharmacy

## 2017-09-06 ENCOUNTER — Other Ambulatory Visit: Payer: Self-pay

## 2017-09-06 MED ORDER — IRBESARTAN 300 MG PO TABS: 300.0000 mg | ORAL_TABLET | Freq: Every day | ORAL | 0 refills | Status: DC

## 2017-09-06 NOTE — Telephone Encounter (Signed)
Irbesartan 300mg  #90 faxed to pharm with note to advise pt to sched appt/thx dmf

## 2017-09-30 DIAGNOSIS — Z01411 Encounter for gynecological examination (general) (routine) with abnormal findings: Secondary | ICD-10-CM | POA: Diagnosis not present

## 2017-09-30 DIAGNOSIS — B372 Candidiasis of skin and nail: Secondary | ICD-10-CM | POA: Diagnosis not present

## 2017-10-02 DIAGNOSIS — J45998 Other asthma: Secondary | ICD-10-CM | POA: Diagnosis not present

## 2017-10-02 DIAGNOSIS — R0602 Shortness of breath: Secondary | ICD-10-CM | POA: Diagnosis not present

## 2017-10-08 ENCOUNTER — Ambulatory Visit (INDEPENDENT_AMBULATORY_CARE_PROVIDER_SITE_OTHER): Payer: Medicare Other | Admitting: Internal Medicine

## 2017-10-08 ENCOUNTER — Other Ambulatory Visit (INDEPENDENT_AMBULATORY_CARE_PROVIDER_SITE_OTHER): Payer: Medicare Other

## 2017-10-08 ENCOUNTER — Encounter: Payer: Self-pay | Admitting: Internal Medicine

## 2017-10-08 ENCOUNTER — Telehealth: Payer: Self-pay

## 2017-10-08 VITALS — BP 130/60 | HR 57 | Temp 97.9°F | Ht <= 58 in | Wt 219.0 lb

## 2017-10-08 DIAGNOSIS — I10 Essential (primary) hypertension: Secondary | ICD-10-CM | POA: Diagnosis not present

## 2017-10-08 DIAGNOSIS — M1A0710 Idiopathic chronic gout, right ankle and foot, without tophus (tophi): Secondary | ICD-10-CM | POA: Diagnosis not present

## 2017-10-08 DIAGNOSIS — Z0000 Encounter for general adult medical examination without abnormal findings: Secondary | ICD-10-CM

## 2017-10-08 DIAGNOSIS — K219 Gastro-esophageal reflux disease without esophagitis: Secondary | ICD-10-CM | POA: Diagnosis not present

## 2017-10-08 DIAGNOSIS — E785 Hyperlipidemia, unspecified: Secondary | ICD-10-CM

## 2017-10-08 LAB — COMPREHENSIVE METABOLIC PANEL
ALT: 10 U/L (ref 0–35)
AST: 11 U/L (ref 0–37)
Albumin: 4.1 g/dL (ref 3.5–5.2)
Alkaline Phosphatase: 54 U/L (ref 39–117)
BUN: 15 mg/dL (ref 6–23)
CO2: 29 mEq/L (ref 19–32)
Calcium: 10.2 mg/dL (ref 8.4–10.5)
Chloride: 106 mEq/L (ref 96–112)
Creatinine, Ser: 1.2 mg/dL (ref 0.40–1.20)
GFR: 56.38 mL/min — ABNORMAL LOW (ref >60.00)
Glucose, Bld: 92 mg/dL (ref 70–99)
Potassium: 3.8 mEq/L (ref 3.5–5.1)
Sodium: 143 mEq/L (ref 135–145)
Total Bilirubin: 0.5 mg/dL (ref 0.2–1.2)
Total Protein: 7 g/dL (ref 6.0–8.3)

## 2017-10-08 LAB — LIPID PANEL
Cholesterol: 229 mg/dL — ABNORMAL HIGH (ref 0–200)
HDL: 50.6 mg/dL (ref >39.00)
LDL Cholesterol: 160 mg/dL — ABNORMAL HIGH (ref 0–99)
NonHDL: 178
Total CHOL/HDL Ratio: 5
Triglycerides: 88 mg/dL (ref 0.0–149.0)
VLDL: 17.6 mg/dL (ref 0.0–40.0)

## 2017-10-08 LAB — HEMOGLOBIN A1C: Hgb A1c MFr Bld: 4.8 % (ref 4.6–6.5)

## 2017-10-08 MED ORDER — HYDROCODONE-ACETAMINOPHEN 5-325 MG PO TABS: 1.0000 | ORAL_TABLET | Freq: Every day | ORAL | 0 refills | Status: DC | PRN

## 2017-10-08 NOTE — Assessment & Plan Note (Addendum)
Checking lipid panel and off meds currently.

## 2017-10-08 NOTE — Assessment & Plan Note (Signed)
Cologuard ordered. Mammogram overdue. Flu and tetanus and pneumonia completed. Counseled about shingrix. Counseled about sun safety and mole surveillance. Given 10 year screening recommendations.

## 2017-10-08 NOTE — Assessment & Plan Note (Signed)
BP at goal off irbesartan, continue hydralazine and metoprolol. Checking CMP and adjust as needed.

## 2017-10-08 NOTE — Telephone Encounter (Signed)
Order [id number]

## 2017-10-08 NOTE — Assessment & Plan Note (Signed)
Taking pepcid and good control.

## 2017-10-08 NOTE — Assessment & Plan Note (Signed)
No flare today, colchicine as needed.

## 2017-10-08 NOTE — Patient Instructions (Signed)
We will check the labs today.  Ask the pharmacist about the shingrix which is the shingles vaccine.   Health Maintenance, Female Adopting a healthy lifestyle and getting preventive care can go a long way to promote health and wellness. Talk with your health care provider about what schedule of regular examinations is right for you. This is a good chance for you to check in with your provider about disease prevention and staying healthy. In between checkups, there are plenty of things you can do on your own. Experts have done a lot of research about which lifestyle changes and preventive measures are most likely to keep you healthy. Ask your health care provider for more information. Weight and diet Eat a healthy diet  Be sure to include plenty of vegetables, fruits, low-fat dairy products, and lean protein.  Do not eat a lot of foods high in solid fats, added sugars, or salt.  Get regular exercise. This is one of the most important things you can do for your health. ? Most adults should exercise for at least 150 minutes each week. The exercise should increase your heart rate and make you sweat (moderate-intensity exercise). ? Most adults should also do strengthening exercises at least twice a week. This is in addition to the moderate-intensity exercise.  Maintain a healthy weight  Body mass index (BMI) is a measurement that can be used to identify possible weight problems. It estimates body fat based on height and weight. Your health care provider can help determine your BMI and help you achieve or maintain a healthy weight.  For females 4 years of age and older: ? A BMI below 18.5 is considered underweight. ? A BMI of 18.5 to 24.9 is normal. ? A BMI of 25 to 29.9 is considered overweight. ? A BMI of 30 and above is considered obese.  Watch levels of cholesterol and blood lipids  You should start having your blood tested for lipids and cholesterol at 74 years of age, then have this  test every 5 years.  You may need to have your cholesterol levels checked more often if: ? Your lipid or cholesterol levels are high. ? You are older than 74 years of age. ? You are at high risk for heart disease.  Cancer screening Lung Cancer  Lung cancer screening is recommended for adults 70-47 years old who are at high risk for lung cancer because of a history of smoking.  A yearly low-dose CT scan of the lungs is recommended for people who: ? Currently smoke. ? Have quit within the past 15 years. ? Have at least a 30-pack-year history of smoking. A pack year is smoking an average of one pack of cigarettes a day for 1 year.  Yearly screening should continue until it has been 15 years since you quit.  Yearly screening should stop if you develop a health problem that would prevent you from having lung cancer treatment.  Breast Cancer  Practice breast self-awareness. This means understanding how your breasts normally appear and feel.  It also means doing regular breast self-exams. Let your health care provider know about any changes, no matter how small.  If you are in your 20s or 30s, you should have a clinical breast exam (CBE) by a health care provider every 1-3 years as part of a regular health exam.  If you are 4 or older, have a CBE every year. Also consider having a breast X-ray (mammogram) every year.  If you have a family  history of breast cancer, talk to your health care provider about genetic screening.  If you are at high risk for breast cancer, talk to your health care provider about having an MRI and a mammogram every year.  Breast cancer gene (BRCA) assessment is recommended for women who have family members with BRCA-related cancers. BRCA-related cancers include: ? Breast. ? Ovarian. ? Tubal. ? Peritoneal cancers.  Results of the assessment will determine the need for genetic counseling and BRCA1 and BRCA2 testing.  Cervical Cancer Your health care  provider may recommend that you be screened regularly for cancer of the pelvic organs (ovaries, uterus, and vagina). This screening involves a pelvic examination, including checking for microscopic changes to the surface of your cervix (Pap test). You may be encouraged to have this screening done every 3 years, beginning at age 66.  For women ages 96-65, health care providers may recommend pelvic exams and Pap testing every 3 years, or they may recommend the Pap and pelvic exam, combined with testing for human papilloma virus (HPV), every 5 years. Some types of HPV increase your risk of cervical cancer. Testing for HPV may also be done on women of any age with unclear Pap test results.  Other health care providers may not recommend any screening for nonpregnant women who are considered low risk for pelvic cancer and who do not have symptoms. Ask your health care provider if a screening pelvic exam is right for you.  If you have had past treatment for cervical cancer or a condition that could lead to cancer, you need Pap tests and screening for cancer for at least 20 years after your treatment. If Pap tests have been discontinued, your risk factors (such as having a new sexual partner) need to be reassessed to determine if screening should resume. Some women have medical problems that increase the chance of getting cervical cancer. In these cases, your health care provider may recommend more frequent screening and Pap tests.  Colorectal Cancer  This type of cancer can be detected and often prevented.  Routine colorectal cancer screening usually begins at 74 years of age and continues through 74 years of age.  Your health care provider may recommend screening at an earlier age if you have risk factors for colon cancer.  Your health care provider may also recommend using home test kits to check for hidden blood in the stool.  A small camera at the end of a tube can be used to examine your colon  directly (sigmoidoscopy or colonoscopy). This is done to check for the earliest forms of colorectal cancer.  Routine screening usually begins at age 46.  Direct examination of the colon should be repeated every 5-10 years through 74 years of age. However, you may need to be screened more often if early forms of precancerous polyps or small growths are found.  Skin Cancer  Check your skin from head to toe regularly.  Tell your health care provider about any new moles or changes in moles, especially if there is a change in a mole's shape or color.  Also tell your health care provider if you have a mole that is larger than the size of a pencil eraser.  Always use sunscreen. Apply sunscreen liberally and repeatedly throughout the day.  Protect yourself by wearing long sleeves, pants, a wide-brimmed hat, and sunglasses whenever you are outside.  Heart disease, diabetes, and high blood pressure  High blood pressure causes heart disease and increases the risk of stroke.  High blood pressure is more likely to develop in: ? People who have blood pressure in the high end of the normal range (130-139/85-89 mm Hg). ? People who are overweight or obese. ? People who are African American.  If you are 69-78 years of age, have your blood pressure checked every 3-5 years. If you are 59 years of age or older, have your blood pressure checked every year. You should have your blood pressure measured twice-once when you are at a hospital or clinic, and once when you are not at a hospital or clinic. Record the average of the two measurements. To check your blood pressure when you are not at a hospital or clinic, you can use: ? An automated blood pressure machine at a pharmacy. ? A home blood pressure monitor.  If you are between 71 years and 74 years old, ask your health care provider if you should take aspirin to prevent strokes.  Have regular diabetes screenings. This involves taking a blood sample to  check your fasting blood sugar level. ? If you are at a normal weight and have a low risk for diabetes, have this test once every three years after 74 years of age. ? If you are overweight and have a high risk for diabetes, consider being tested at a younger age or more often. Preventing infection Hepatitis B  If you have a higher risk for hepatitis B, you should be screened for this virus. You are considered at high risk for hepatitis B if: ? You were born in a country where hepatitis B is common. Ask your health care provider which countries are considered high risk. ? Your parents were born in a high-risk country, and you have not been immunized against hepatitis B (hepatitis B vaccine). ? You have HIV or AIDS. ? You use needles to inject street drugs. ? You live with someone who has hepatitis B. ? You have had sex with someone who has hepatitis B. ? You get hemodialysis treatment. ? You take certain medicines for conditions, including cancer, organ transplantation, and autoimmune conditions.  Hepatitis C  Blood testing is recommended for: ? Everyone born from [birth year] through 1963. ? Anyone with known risk factors for hepatitis C.  Sexually transmitted infections (STIs)  You should be screened for sexually transmitted infections (STIs) including gonorrhea and chlamydia if: ? You are sexually active and are younger than 74 years of age. ? You are older than 74 years of age and your health care provider tells you that you are at risk for this type of infection. ? Your sexual activity has changed since you were last screened and you are at an increased risk for chlamydia or gonorrhea. Ask your health care provider if you are at risk.  If you do not have HIV, but are at risk, it may be recommended that you take a prescription medicine daily to prevent HIV infection. This is called pre-exposure prophylaxis (PrEP). You are considered at risk if: ? You are sexually active and do not regularly  use condoms or know the HIV status of your partner(s). ? You take drugs by injection. ? You are sexually active with a partner who has HIV.  Talk with your health care provider about whether you are at high risk of being infected with HIV. If you choose to begin PrEP, you should first be tested for HIV. You should then be tested every 3 months for as long as you are taking PrEP. Pregnancy  If you  are premenopausal and you may become pregnant, ask your health care provider about preconception counseling.  If you may become pregnant, take 400 to 800 micrograms (mcg) of folic acid every day.  If you want to prevent pregnancy, talk to your health care provider about birth control (contraception). Osteoporosis and menopause  Osteoporosis is a disease in which the bones lose minerals and strength with aging. This can result in serious bone fractures. Your risk for osteoporosis can be identified using a bone density scan.  If you are 61 years of age or older, or if you are at risk for osteoporosis and fractures, ask your health care provider if you should be screened.  Ask your health care provider whether you should take a calcium or vitamin D supplement to lower your risk for osteoporosis.  Menopause may have certain physical symptoms and risks.  Hormone replacement therapy may reduce some of these symptoms and risks. Talk to your health care provider about whether hormone replacement therapy is right for you. Follow these instructions at home:  Schedule regular health, dental, and eye exams.  Stay current with your immunizations.  Do not use any tobacco products including cigarettes, chewing tobacco, or electronic cigarettes.  If you are pregnant, do not drink alcohol.  If you are breastfeeding, limit how much and how often you drink alcohol.  Limit alcohol intake to no more than 1 drink per day for nonpregnant women. One drink equals 12 ounces of beer, 5 ounces of wine, or 1 ounces  of hard liquor.  Do not use street drugs.  Do not share needles.  Ask your health care provider for help if you need support or information about quitting drugs.  Tell your health care provider if you often feel depressed.  Tell your health care provider if you have ever been abused or do not feel safe at home. This information is not intended to replace advice given to you by your health care provider. Make sure you discuss any questions you have with your health care provider. Document Released: 06/01/2011 Document Revised: 04/23/2016 Document Reviewed: 08/20/2015 Elsevier Interactive Patient Education  [employer].

## 2017-10-08 NOTE — Assessment & Plan Note (Signed)
Complicated by hyperlipidemia, OA, hypertension. She knows that she needs to continue to lose weight for her health.

## 2017-10-08 NOTE — Progress Notes (Signed)
   Subjective:    Patient ID: Heather Grant, female    DOB: March 20, 1941, 76 y.o.   MRN: [id number]  HPI Here for medicare wellness and CPE, no new complaints. Please see A/P for status and treatment of chronic medical problems.   Diet: heart healthy Physical activity: sedentary Depression/mood screen: negative Hearing: intact to whispered voice Visual acuity: grossly normal with lens, performs annual eye exam  ADLs: capable Fall risk: low Home safety: good Cognitive evaluation: intact to orientation, naming, recall and repetition EOL planning: adv directives discussed  I have personally reviewed and have noted 1. The patient's medical and social history - reviewed today no changes 2. Their use of alcohol, tobacco or illicit drugs 3. Their current medications and supplements 4. The patient's functional ability including ADL's, fall risks, home safety risks and hearing or visual impairment. 5. Diet and physical activities 6. Evidence for depression or mood disorders 7. Care team reviewed and updated (available in snapshot)  Review of Systems  Constitutional: Negative.   HENT: Negative.   Eyes: Negative.   Respiratory: Negative for cough, chest tightness and shortness of breath.   Cardiovascular: Negative for chest pain, palpitations and leg swelling.  Gastrointestinal: Negative for abdominal distention, abdominal pain, constipation, diarrhea, nausea and vomiting.  Musculoskeletal: Positive for arthralgias.  Skin: Negative.   Neurological: Negative.   Psychiatric/Behavioral: Negative.       Objective:   Physical Exam  Constitutional: She is oriented to person, place, and time. She appears well-developed and well-nourished.  Obese  HENT:  Head: Normocephalic and atraumatic.  Eyes: EOM are normal.  Neck: Normal range of motion.  Cardiovascular: Normal rate and regular rhythm.  Pulmonary/Chest: Effort normal and breath sounds normal. No respiratory distress. She has no  wheezes. She has no rales.  Abdominal: Soft. Bowel sounds are normal. She exhibits no distension. There is no tenderness. There is no rebound.  Musculoskeletal: She exhibits no edema.  Neurological: She is alert and oriented to person, place, and time. Coordination normal.  Skin: Skin is warm and dry.  Psychiatric: She has a normal mood and affect.   Vitals:   10/08/17 1302  BP: 130/60  Pulse: (!) 57  Temp: 97.9 F (36.6 C)  TempSrc: Oral  SpO2: 100%  Weight: 219 lb (99.3 kg)  Height: 4\' 9"  (1.448 m)      Assessment & Plan:

## 2017-10-17 ENCOUNTER — Other Ambulatory Visit: Payer: Self-pay | Admitting: Internal Medicine

## 2017-10-18 ENCOUNTER — Other Ambulatory Visit: Payer: Self-pay | Admitting: Internal Medicine

## 2017-10-18 DIAGNOSIS — J45901 Unspecified asthma with (acute) exacerbation: Secondary | ICD-10-CM

## 2017-11-01 DIAGNOSIS — J45998 Other asthma: Secondary | ICD-10-CM | POA: Diagnosis not present

## 2017-11-01 DIAGNOSIS — R0602 Shortness of breath: Secondary | ICD-10-CM | POA: Diagnosis not present

## 2017-11-24 ENCOUNTER — Telehealth: Payer: Self-pay | Admitting: Internal Medicine

## 2017-11-24 NOTE — Telephone Encounter (Signed)
Check [place] registry last filled 10/13/2017...Johny Chess

## 2017-11-24 NOTE — Telephone Encounter (Signed)
Requesting pain medication. Last office visit - 10/08/17. Thanks.

## 2017-11-24 NOTE — Telephone Encounter (Signed)
Copied from [place] [id number]. Topic: Quick Communication - Rx Refill/Question >> Nov 24, 2017 12:21 PM Robina Ade, Helene Kelp D wrote: Has the patient contacted their pharmacy? No (Agent: If no, request that the patient contact the pharmacy for the refill.) Preferred Pharmacy (with phone number or street name): [id number] - [place], [place] Agent: Please be advised that RX refills may take up to 3 business days. We ask that you follow-up with your pharmacy. Patient needs refill on her HYDROcodone-acetaminophen (NORCO/VICODIN) 5-325 MG tablet. Please  call patient when its ready for pick-up.

## 2017-11-25 ENCOUNTER — Other Ambulatory Visit: Payer: Self-pay | Admitting: Internal Medicine

## 2017-11-25 MED ORDER — PANTOPRAZOLE SODIUM 40 MG PO TBEC: DELAYED_RELEASE_TABLET | ORAL | 3 refills | Status: DC

## 2017-11-25 MED ORDER — HYDROCODONE-ACETAMINOPHEN 5-325 MG PO TABS: 1.0000 | ORAL_TABLET | Freq: Every day | ORAL | 0 refills | Status: DC | PRN

## 2017-11-25 NOTE — Telephone Encounter (Signed)
Filled, she does need visit every 3 months for this medication so remind her that this is due by Feb 9th 2019.

## 2017-11-25 NOTE — Telephone Encounter (Signed)
Notified pt w/MD response.../lmb 

## 2017-12-01 DIAGNOSIS — Z1231 Encounter for screening mammogram for malignant neoplasm of breast: Secondary | ICD-10-CM | POA: Diagnosis not present

## 2017-12-02 DIAGNOSIS — J45998 Other asthma: Secondary | ICD-10-CM | POA: Diagnosis not present

## 2017-12-02 DIAGNOSIS — R0602 Shortness of breath: Secondary | ICD-10-CM | POA: Diagnosis not present

## 2017-12-03 ENCOUNTER — Telehealth: Payer: Self-pay

## 2017-12-03 NOTE — Telephone Encounter (Signed)
Sent letter with patient copy of controlled substance contract

## 2017-12-07 DIAGNOSIS — Z1211 Encounter for screening for malignant neoplasm of colon: Secondary | ICD-10-CM | POA: Diagnosis not present

## 2017-12-07 DIAGNOSIS — Z1212 Encounter for screening for malignant neoplasm of rectum: Secondary | ICD-10-CM | POA: Diagnosis not present

## 2017-12-13 ENCOUNTER — Other Ambulatory Visit: Payer: Self-pay | Admitting: Internal Medicine

## 2017-12-16 LAB — COLOGUARD: Cologuard: POSITIVE

## 2017-12-23 ENCOUNTER — Encounter: Payer: Self-pay | Admitting: Internal Medicine

## 2017-12-23 ENCOUNTER — Other Ambulatory Visit: Payer: Self-pay | Admitting: Internal Medicine

## 2017-12-23 DIAGNOSIS — R195 Other fecal abnormalities: Secondary | ICD-10-CM

## 2017-12-23 NOTE — Telephone Encounter (Signed)
Patient informed and referral for GI is placed

## 2017-12-23 NOTE — Progress Notes (Signed)
Abstracted and sent to scan  

## 2017-12-29 DIAGNOSIS — M170 Bilateral primary osteoarthritis of knee: Secondary | ICD-10-CM | POA: Diagnosis not present

## 2018-01-02 DIAGNOSIS — J45998 Other asthma: Secondary | ICD-10-CM | POA: Diagnosis not present

## 2018-01-02 DIAGNOSIS — R0602 Shortness of breath: Secondary | ICD-10-CM | POA: Diagnosis not present

## 2018-01-10 ENCOUNTER — Other Ambulatory Visit: Payer: Self-pay | Admitting: Internal Medicine

## 2018-01-11 ENCOUNTER — Telehealth: Payer: Self-pay | Admitting: Internal Medicine

## 2018-01-11 ENCOUNTER — Other Ambulatory Visit: Payer: Self-pay | Admitting: Internal Medicine

## 2018-01-11 MED ORDER — MOMETASONE FURO-FORMOTEROL FUM 100-5 MCG/ACT IN AERO: INHALATION_SPRAY | RESPIRATORY_TRACT | 3 refills | Status: DC

## 2018-01-11 NOTE — Telephone Encounter (Signed)
MW are you ok with [place] sending in a refill for this. Please advise. Thank you.

## 2018-01-11 NOTE — Telephone Encounter (Signed)
Happy to do it for 3 months but then ov with me or PCP prior to running out to regroup re longterm needs

## 2018-01-11 NOTE — Telephone Encounter (Signed)
Medication sent in, patient aware, making appointment with PCP. Nothing further needed.

## 2018-01-13 ENCOUNTER — Encounter: Payer: Self-pay | Admitting: Gastroenterology

## 2018-01-13 ENCOUNTER — Other Ambulatory Visit: Payer: Self-pay

## 2018-01-13 MED ORDER — METOPROLOL TARTRATE 25 MG PO TABS: 25.0000 mg | ORAL_TABLET | Freq: Two times a day (BID) | ORAL | 1 refills | Status: DC

## 2018-01-13 MED ORDER — HYDRALAZINE HCL 25 MG PO TABS: ORAL_TABLET | ORAL | 1 refills | Status: DC

## 2018-01-30 DIAGNOSIS — R0602 Shortness of breath: Secondary | ICD-10-CM | POA: Diagnosis not present

## 2018-01-30 DIAGNOSIS — J45998 Other asthma: Secondary | ICD-10-CM | POA: Diagnosis not present

## 2018-02-11 ENCOUNTER — Other Ambulatory Visit: Payer: Self-pay | Admitting: Internal Medicine

## 2018-02-28 ENCOUNTER — Encounter: Payer: Self-pay | Admitting: Gastroenterology

## 2018-02-28 ENCOUNTER — Ambulatory Visit (INDEPENDENT_AMBULATORY_CARE_PROVIDER_SITE_OTHER): Payer: Medicare Other | Admitting: Gastroenterology

## 2018-02-28 VITALS — BP 126/80 | HR 74 | Ht <= 58 in | Wt 213.2 lb

## 2018-02-28 DIAGNOSIS — Z6841 Body Mass Index (BMI) 40.0 and over, adult: Secondary | ICD-10-CM

## 2018-02-28 DIAGNOSIS — R195 Other fecal abnormalities: Secondary | ICD-10-CM | POA: Diagnosis not present

## 2018-02-28 MED ORDER — PEG-KCL-NACL-NASULF-NA ASC-C 140 G PO SOLR: 1.0000 | ORAL | Status: DC

## 2018-02-28 MED ORDER — METOCLOPRAMIDE HCL 5 MG PO TABS: 5.0000 mg | ORAL_TABLET | ORAL | 0 refills | Status: DC
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
## 2018-02-28 NOTE — Patient Instructions (Addendum)
If you are age 75 or older, your body mass index should be between 23-30. Your Body mass index is 46.15 kg/m. If this is out of the aforementioned range listed, please consider follow up with your Primary Care Provider.  If you are age 64 or younger, your body mass index should be between 19-25. Your Body mass index is 46.15 kg/m. If this is out of the aformentioned range listed, please consider follow up with your Primary Care Provider.  We have sent the following medications to your pharmacy for you to pick up at your convenience: Reglan 5mg  tablet Take one tablet 30 minutes prior to taking your bowel prep.   You have been scheduled for a colonoscopy. Please follow written instructions given to you at your visit today.  Please pick up your prep supplies at the pharmacy within the next 1-3 days. If you use inhalers (even only as needed), please bring them with you on the day of your procedure. Your physician has requested that you go to www.startemmi.com and enter the access code given to you at your visit today. This web site gives a general overview about your procedure. However, you should still follow specific instructions given to you by our office regarding your preparation for the procedure.  Thank you for choosing [facility]  Dr Wilfrid Lund III

## 2018-03-02 DIAGNOSIS — J45998 Other asthma: Secondary | ICD-10-CM | POA: Diagnosis not present

## 2018-03-02 DIAGNOSIS — R0602 Shortness of breath: Secondary | ICD-10-CM | POA: Diagnosis not present

## 2018-03-08 ENCOUNTER — Other Ambulatory Visit: Payer: Self-pay | Admitting: Internal Medicine

## 2018-04-01 DIAGNOSIS — R0602 Shortness of breath: Secondary | ICD-10-CM | POA: Diagnosis not present

## 2018-04-01 DIAGNOSIS — J45998 Other asthma: Secondary | ICD-10-CM | POA: Diagnosis not present

## 2018-04-06 ENCOUNTER — Ambulatory Visit (INDEPENDENT_AMBULATORY_CARE_PROVIDER_SITE_OTHER): Payer: Medicare Other | Admitting: Internal Medicine

## 2018-04-06 ENCOUNTER — Encounter: Payer: Self-pay | Admitting: Internal Medicine

## 2018-04-06 DIAGNOSIS — J4520 Mild intermittent asthma, uncomplicated: Secondary | ICD-10-CM | POA: Diagnosis not present

## 2018-04-06 DIAGNOSIS — M150 Primary generalized (osteo)arthritis: Secondary | ICD-10-CM

## 2018-04-06 DIAGNOSIS — M8949 Other hypertrophic osteoarthropathy, multiple sites: Secondary | ICD-10-CM

## 2018-04-06 DIAGNOSIS — I10 Essential (primary) hypertension: Secondary | ICD-10-CM

## 2018-04-06 DIAGNOSIS — F1120 Opioid dependence, uncomplicated: Secondary | ICD-10-CM | POA: Insufficient documentation

## 2018-04-06 DIAGNOSIS — M159 Polyosteoarthritis, unspecified: Secondary | ICD-10-CM

## 2018-04-06 MED ORDER — FAMOTIDINE 20 MG PO TABS: ORAL_TABLET | ORAL | 2 refills | Status: DC

## 2018-04-06 MED ORDER — TIZANIDINE HCL 4 MG PO TABS
4.0000 mg | ORAL_TABLET | Freq: Four times a day (QID) | ORAL | 0 refills | Status: DC | PRN
Start: 2018-04-06 — End: 2018-10-10

## 2018-04-06 MED ORDER — HYDROCODONE-ACETAMINOPHEN 5-325 MG PO TABS: 1.0000 | ORAL_TABLET | Freq: Every day | ORAL | 0 refills | Status: DC | PRN

## 2018-04-06 MED ORDER — MELOXICAM 15 MG PO TABS: 15.0000 mg | ORAL_TABLET | Freq: Every day | ORAL | 3 refills | Status: DC

## 2018-04-06 NOTE — Assessment & Plan Note (Signed)
Uses 10 pills per 3-4 months of hydrocodone. She tries to minimize usage as she understands the risk of dependence, memory changes, increased risk of falls. She generally uses at night time if needed. Will try nsaids and takes muscle relaxers rarely as well to help with pain. Is not very mobile as she is limited due to knee pain. Refill hydrocodone today and okay with visits every 6 months for refills with [place] database checks every 3 months.

## 2018-04-06 NOTE — Progress Notes (Signed)
   Subjective:    Patient ID: Heather Grant, female    DOB: July 31, 1941, 77 y.o.   MRN: [id number]  HPI The patient is a 77 YO female coming in for follow up of her blood pressure (taking hydralazine and metoprolol and BP at goal, denies side effects of meds, denies chest pains or SOB or abdominal pain) and her knee pain (uses rare hydrocodone and 10 pills usually lasts 3-4 months, denies worsening, wants to know if she can try mobic and wants refill on tizanidine which an orthopedic had given her a long time ago and she uses rarely), and chronic pain (takes hydrocodone intermittently but chronically, 10 pills usually last 3-4 months, denies worsening pain, denies taking more often lately, usually takes at night time to help her sleep).   Review of Systems  Constitutional: Negative.   HENT: Negative.   Eyes: Negative.   Respiratory: Negative for cough, chest tightness and shortness of breath.   Cardiovascular: Negative for chest pain, palpitations and leg swelling.  Gastrointestinal: Negative for abdominal distention, abdominal pain, constipation, diarrhea, nausea and vomiting.  Musculoskeletal: Positive for arthralgias, gait problem and myalgias.  Skin: Negative.   Neurological: Negative for dizziness, speech difficulty, light-headedness and numbness.  Psychiatric/Behavioral: Negative.       Objective:   Physical Exam  Constitutional: She is oriented to person, place, and time. She appears well-developed and well-nourished.  HENT:  Head: Normocephalic and atraumatic.  Eyes: EOM are normal.  Neck: Normal range of motion.  Cardiovascular: Normal rate and regular rhythm.  Pulmonary/Chest: Effort normal and breath sounds normal. No respiratory distress. She has no wheezes. She has no rales.  Abdominal: Soft. Bowel sounds are normal. She exhibits no distension. There is no tenderness. There is no rebound.  Musculoskeletal: She exhibits tenderness. She exhibits no edema.  Neurological:  She is alert and oriented to person, place, and time. Coordination abnormal.  Walker with seat for ambulation  Skin: Skin is warm and dry.  Psychiatric: She has a normal mood and affect.   Vitals:   04/06/18 1011  BP: 124/78  Pulse: (!) 51  Temp: 97.8 F (36.6 C)  TempSrc: Oral  SpO2: 99%  Weight: 211 lb (95.7 kg)  Height: 4\' 9"  (1.448 m)      Assessment & Plan:

## 2018-04-06 NOTE — Patient Instructions (Signed)
We have sent in the refill of the hydrocodone. We have sent in meloxicam to take daily for the arthritis pain.   We have sent in the muscle relaxer to use for the shoulder and back if needed.

## 2018-04-06 NOTE — Assessment & Plan Note (Signed)
Asked to fill dulera and albuterol if needed as symptoms stable for some time and this is fine. Will refill dulera and albuterol as needed. Return to pulmonary prn.

## 2018-04-06 NOTE — Assessment & Plan Note (Signed)
Taking metoprolol and hydralazine with BP at goal. Recent BMP without indication for change. Continue regimen.

## 2018-04-06 NOTE — Assessment & Plan Note (Signed)
Refill hydrocodone, checked [place] narcotic database and no other providers. Uses 10 pills for 3-4 months and uses rarely. Rx for meloxicam and tizanidine to use as alternatives if needed. Refuses surgery for her knee arthritis although it is recommended due to misgivings about surgery.

## 2018-04-07 ENCOUNTER — Ambulatory Visit: Payer: Self-pay | Admitting: Internal Medicine

## 2018-04-13 ENCOUNTER — Encounter: Payer: Self-pay | Admitting: Gastroenterology

## 2018-04-15 ENCOUNTER — Other Ambulatory Visit: Payer: Self-pay

## 2018-04-15 ENCOUNTER — Encounter (HOSPITAL_COMMUNITY): Payer: Self-pay | Admitting: *Deleted

## 2018-04-22 ENCOUNTER — Other Ambulatory Visit: Payer: Self-pay

## 2018-04-26 ENCOUNTER — Ambulatory Visit (HOSPITAL_COMMUNITY): Payer: Medicare Other | Admitting: Anesthesiology

## 2018-04-26 ENCOUNTER — Encounter (HOSPITAL_COMMUNITY): Payer: Self-pay | Admitting: Anesthesiology

## 2018-04-26 ENCOUNTER — Encounter (HOSPITAL_COMMUNITY)
Admission: RE | Disposition: A | Discharge status: Home / Self Care | Payer: Self-pay | Source: Ambulatory Visit | Attending: Gastroenterology

## 2018-04-26 ENCOUNTER — Other Ambulatory Visit: Payer: Self-pay

## 2018-04-26 ENCOUNTER — Ambulatory Visit (HOSPITAL_COMMUNITY)
Admission: RE | Admit: 2018-04-26 | Discharge: 2018-04-26 | Disposition: A | Discharge status: Home / Self Care | Payer: Medicare Other | Source: Ambulatory Visit | Attending: Gastroenterology | Admitting: Gastroenterology

## 2018-04-26 DIAGNOSIS — M109 Gout, unspecified: Secondary | ICD-10-CM | POA: Insufficient documentation

## 2018-04-26 DIAGNOSIS — Z6841 Body Mass Index (BMI) 40.0 and over, adult: Secondary | ICD-10-CM | POA: Insufficient documentation

## 2018-04-26 DIAGNOSIS — I10 Essential (primary) hypertension: Secondary | ICD-10-CM | POA: Diagnosis not present

## 2018-04-26 DIAGNOSIS — K5730 Diverticulosis of large intestine without perforation or abscess without bleeding: Secondary | ICD-10-CM | POA: Insufficient documentation

## 2018-04-26 DIAGNOSIS — R195 Other fecal abnormalities: Secondary | ICD-10-CM

## 2018-04-26 DIAGNOSIS — G4733 Obstructive sleep apnea (adult) (pediatric): Secondary | ICD-10-CM | POA: Insufficient documentation

## 2018-04-26 HISTORY — PX: COLONOSCOPY WITH PROPOFOL: SHX5780

## 2018-04-26 SURGERY — COLONOSCOPY WITH PROPOFOL
Anesthesia: Monitor Anesthesia Care

## 2018-04-26 MED ORDER — ONDANSETRON HCL 4 MG/2ML IJ SOLN
INTRAMUSCULAR | Status: DC | PRN
  Administered 2018-04-26: 4 mg via INTRAVENOUS

## 2018-04-26 MED ORDER — PROPOFOL 10 MG/ML IV BOLUS
INTRAVENOUS | Status: DC | PRN
  Administered 2018-04-26 (×2): 20 mg via INTRAVENOUS

## 2018-04-26 MED ORDER — PROPOFOL 500 MG/50ML IV EMUL
INTRAVENOUS | Status: DC | PRN
  Administered 2018-04-26: 120 ug/kg/min via INTRAVENOUS

## 2018-04-26 MED ORDER — LACTATED RINGERS IV SOLN
INTRAVENOUS | Status: DC
  Administered 2018-04-26: 1000 mL via INTRAVENOUS

## 2018-04-26 MED ORDER — PROPOFOL 10 MG/ML IV BOLUS
INTRAVENOUS | Status: AC
  Filled 2018-04-26: qty 60

## 2018-04-26 MED ORDER — LIDOCAINE 2% (20 MG/ML) 5 ML SYRINGE
INTRAMUSCULAR | Status: DC | PRN
  Administered 2018-04-26: 100 mg via INTRAVENOUS

## 2018-04-26 MED ORDER — SODIUM CHLORIDE 0.9 % IV SOLN: INTRAVENOUS | Status: DC

## 2018-04-26 MED ORDER — EPHEDRINE SULFATE-NACL 50-0.9 MG/10ML-% IV SOSY
PREFILLED_SYRINGE | INTRAVENOUS | Status: DC | PRN
  Administered 2018-04-26 (×2): 5 mg via INTRAVENOUS

## 2018-04-26 SURGICAL SUPPLY — 21 items

## 2018-04-26 NOTE — Op Note (Signed)
[facility] Patient Name: Heather Grant Procedure Date: 04/26/2018 MRN: [id number] Attending MD: Estill Cotta. Loletha Carrow , MD Date of Birth: 09/05/1941 CSN: [id number] Age: 77 Admit Type: Outpatient Procedure:                Colonoscopy Indications:              Positive Cologuard test Providers:                Mallie Mussel L. Loletha Carrow, MD, Cleda Daub, RN, Charolette Child, Technician Referring MD:             Pricilla Holm MD Medicines:                Monitored Anesthesia Care Complications:            No immediate complications. Estimated Blood Loss:     Estimated blood loss: none. Procedure:                Pre-Anesthesia Assessment:                           - Prior to the procedure, a History and Physical                            was performed, and patient medications and                            allergies were reviewed. The patient's tolerance of                            previous anesthesia was also reviewed. The risks                            and benefits of the procedure and the sedation                            options and risks were discussed with the patient.                            All questions were answered, and informed consent                            was obtained. Prior Anticoagulants: The patient has                            taken no previous anticoagulant or antiplatelet                            agents. ASA Grade Assessment: III - A patient with                            severe systemic disease. After reviewing the risks  and benefits, the patient was deemed in                            satisfactory condition to undergo the procedure.                           After obtaining informed consent, the colonoscope                            was passed under direct vision. Throughout the                            procedure, the patient's blood pressure, pulse, and   oxygen saturations were monitored continuously. The                            EC-[id number] ([id number]) scope was introduced through                            the anus and advanced to the the cecum, identified                            by appendiceal orifice and ileocecal valve. The                            colonoscopy was performed without difficulty. The                            patient tolerated the procedure well. The quality                            of the bowel preparation was excellent. The                            ileocecal valve, appendiceal orifice, and rectum                            were photographed. The quality of the bowel                            preparation was evaluated using the BBPS [facility]                            Bowel Preparation Scale) with scores of: Right                            Colon = 3, Transverse Colon = 3 and Left Colon = 3                            (entire mucosa seen well with no residual staining,                            small fragments of stool or opaque liquid). The  total BBPS score equals 9. Scope In: 8:20:56 AM Scope Out: 8:38:39 AM Scope Withdrawal Time: 0 hours 11 minutes 48 seconds  Total Procedure Duration: 0 hours 17 minutes 43 seconds  Findings:      The perianal and digital rectal examinations were normal.      Multiple medium-mouthed diverticula were found in the left colon.      The exam was otherwise without abnormality on direct and retroflexion       views. Impression:               - Diverticulosis in the left colon.                           - The examination was otherwise normal on direct                            and retroflexion views.                           - No specimens collected. Moderate Sedation:      MAC sedation used Recommendation:           - Patient has a contact number available for                            emergencies. The signs and symptoms of potential                             delayed complications were discussed with the                            patient. Return to normal activities tomorrow.                            Written discharge instructions were provided to the                            patient.                           - Resume previous diet.                           - Continue present medications.                           - No repeat screening colonoscopy due to age. Procedure Code(s):        --- Professional ---                           [phone], Colonoscopy, flexible; diagnostic, including                            collection of specimen(s) by brushing or washing,                            when performed (separate procedure) Diagnosis Code(s):        ---  Professional ---                           R19.5, Other fecal abnormalities                           K57.30, Diverticulosis of large intestine without                            perforation or abscess without bleeding CPT copyright 2017 American Medical Association. All rights reserved. The codes documented in this report are preliminary and upon coder review may  be revised to meet current compliance requirements. Egon Dittus L. Loletha Carrow, MD 04/26/2018 8:46:20 AM This report has been signed electronically. Number of Addenda: 0

## 2018-04-26 NOTE — Interval H&P Note (Signed)
History and Physical Interval Note:  04/26/2018 7:40 AM  Heather Grant  has presented today for surgery, with the diagnosis of positive cologuard -hc  The various methods of treatment have been discussed with the patient and family. After consideration of risks, benefits and other options for treatment, the patient has consented to  Procedure(s): COLONOSCOPY WITH PROPOFOL (N/A) as a surgical intervention .  The patient's history has been reviewed, patient examined, no change in status, stable for surgery.  I have reviewed the patient's chart and labs.  Questions were answered to the patient's satisfaction.     Nelida Meuse III

## 2018-04-26 NOTE — Anesthesia Preprocedure Evaluation (Addendum)
Anesthesia Evaluation  Patient identified by MRN, date of birth, ID band Patient awake    Reviewed: Allergy & Precautions, NPO status , Patient's Chart, lab work & pertinent test results, reviewed documented beta blocker date and time   History of Anesthesia Complications Negative for: history of anesthetic complications  Airway Mallampati: II  TM Distance: >3 FB Neck ROM: Full    Dental no notable dental hx. (+) Dental Advisory Given   Pulmonary asthma , sleep apnea ,    Pulmonary exam normal        Cardiovascular hypertension, Pt. on medications and Pt. on home beta blockers + Peripheral Vascular Disease  Normal cardiovascular exam     Neuro/Psych  Headaches, negative psych ROS   GI/Hepatic Neg liver ROS, GERD  ,  Endo/Other  Morbid obesity  Renal/GU negative Renal ROS     Musculoskeletal   Abdominal   Peds  Hematology   Anesthesia Other Findings   Reproductive/Obstetrics                            Anesthesia Physical Anesthesia Plan  ASA: III  Anesthesia Plan: MAC   Post-op Pain Management:    Induction: Intravenous  PONV Risk Score and Plan: 2 and Ondansetron and Propofol infusion  Airway Management Planned: Natural Airway  Additional Equipment:   Intra-op Plan:   Post-operative Plan:   Informed Consent: I have reviewed the patients History and Physical, chart, labs and discussed the procedure including the risks, benefits and alternatives for the proposed anesthesia with the patient or authorized representative who has indicated his/her understanding and acceptance.   Dental advisory given  Plan Discussed with: Anesthesiologist and CRNA  Anesthesia Plan Comments:        Anesthesia Quick Evaluation

## 2018-04-26 NOTE — Transfer of Care (Signed)
Immediate Anesthesia Transfer of Care Note  Patient: STEPHANE JUNKINS  Procedure(s) Performed: COLONOSCOPY WITH PROPOFOL (N/A )  Patient Location: Endoscopy Unit  Anesthesia Type:MAC  Level of Consciousness: awake and alert   Airway & Oxygen Therapy: Patient Spontanous Breathing and Patient connected to face mask oxygen  Post-op Assessment: Report given to RN and Post -op Vital signs reviewed and stable  Post vital signs: Reviewed and stable  Last Vitals:  Vitals Value Taken Time  BP    Temp    Pulse    Resp    SpO2      Last Pain:  Vitals:   04/26/18 0728  TempSrc: Oral  PainSc: 0-No pain         Complications: No apparent anesthesia complications

## 2018-04-26 NOTE — Anesthesia Postprocedure Evaluation (Signed)
Anesthesia Post Note  Patient: Heather Grant  Procedure(s) Performed: COLONOSCOPY WITH PROPOFOL (N/A )     Patient location during evaluation: Endoscopy Anesthesia Type: MAC Level of consciousness: awake and alert Pain management: pain level controlled Vital Signs Assessment: post-procedure vital signs reviewed and stable Respiratory status: spontaneous breathing, nonlabored ventilation, respiratory function stable and patient connected to nasal cannula oxygen Cardiovascular status: blood pressure returned to baseline and stable Postop Assessment: no apparent nausea or vomiting Anesthetic complications: no    Last Vitals:  Vitals:   04/26/18 0850 04/26/18 0900  BP: (!) 155/56 (!) 166/62  Pulse: 61 66  Resp: 17 15  Temp:    SpO2: 100% 99%    Last Pain:  Vitals:   04/26/18 0848  TempSrc: Oral  PainSc: 0-No pain                 Denario Bagot DANIEL

## 2018-04-26 NOTE — Discharge Instructions (Signed)

## 2018-04-26 NOTE — H&P (Signed)
History:  This patient presents for endoscopic testing for positive cologuard test.  Franchot Mimes Referring physician: Hoyt Koch, MD  Past Medical History: Past Medical History:  Diagnosis Date  . Arthritis    hands and knees. May be hips  . Gout    great toe, ankles  . Hyperlipidemia   . Hypertension   . Migraines   . Nocturnal dyspnea    ONO 2010 with several readings  below 88%  . Obesity, Class II, BMI 35-39.9   . OSA (obstructive sleep apnea)    mild by study Dec '09. AHI 5.7  . Plantar fasciitis   . Varicella      Past Surgical History: Past Surgical History:  Procedure Laterality Date  . CARPAL TUNNEL RELEASE     right  . cataract     right eye with IOL  . DILATION AND CURETTAGE OF UTERUS    . G5P4     NSVD    Allergies: No Known Allergies  Outpatient Meds: Current Facility-Administered Medications  Medication Dose Route Frequency Provider Last Rate Last Dose  . lactated ringers infusion   Intravenous Continuous Danis, Estill Cotta III, MD          ___________________________________________________________________ Objective   Exam:  BP (!) 150/57   Pulse (!) 51   Temp 97.9 F (36.6 C) (Oral)   Resp 12   Ht 4\' 9"  (1.448 m)   Wt 211 lb (95.7 kg)   SpO2 100%   BMI 45.66 kg/m    CV: RRR without murmur, S1/S2, no JVD, no peripheral edema  Resp: clear to auscultation bilaterally, normal RR and effort noted  GI: soft, no tenderness, with active bowel sounds. No guarding or palpable organomegaly noted.  Neuro: awake, alert and oriented x 3. Normal gross motor function and fluent speech   Assessment:  Positive cologuard test  Plan:  colonoscopy   Nelida Meuse III

## 2018-04-26 NOTE — Anesthesia Procedure Notes (Signed)
Date/Time: 04/26/2018 8:15 AM Performed by: Sharlette Dense, CRNA Oxygen Delivery Method: Simple face mask

## 2018-05-02 DIAGNOSIS — R0602 Shortness of breath: Secondary | ICD-10-CM | POA: Diagnosis not present

## 2018-05-02 DIAGNOSIS — J45998 Other asthma: Secondary | ICD-10-CM | POA: Diagnosis not present

## 2018-05-06 DIAGNOSIS — M1711 Unilateral primary osteoarthritis, right knee: Secondary | ICD-10-CM | POA: Diagnosis not present

## 2018-05-06 DIAGNOSIS — M1712 Unilateral primary osteoarthritis, left knee: Secondary | ICD-10-CM | POA: Diagnosis not present

## 2018-05-23 ENCOUNTER — Other Ambulatory Visit: Payer: Self-pay | Admitting: Internal Medicine

## 2018-06-01 DIAGNOSIS — R0602 Shortness of breath: Secondary | ICD-10-CM | POA: Diagnosis not present

## 2018-06-01 DIAGNOSIS — J45998 Other asthma: Secondary | ICD-10-CM | POA: Diagnosis not present

## 2018-06-12 ENCOUNTER — Other Ambulatory Visit: Payer: Self-pay | Admitting: Internal Medicine

## 2018-07-02 DIAGNOSIS — R0602 Shortness of breath: Secondary | ICD-10-CM | POA: Diagnosis not present

## 2018-07-02 DIAGNOSIS — J45998 Other asthma: Secondary | ICD-10-CM | POA: Diagnosis not present

## 2018-07-12 ENCOUNTER — Other Ambulatory Visit: Payer: Self-pay | Admitting: Internal Medicine

## 2018-07-30 ENCOUNTER — Other Ambulatory Visit: Payer: Self-pay | Admitting: Internal Medicine

## 2018-08-02 DIAGNOSIS — R0602 Shortness of breath: Secondary | ICD-10-CM | POA: Diagnosis not present

## 2018-08-02 DIAGNOSIS — J45998 Other asthma: Secondary | ICD-10-CM | POA: Diagnosis not present

## 2018-08-17 DIAGNOSIS — M25571 Pain in right ankle and joints of right foot: Secondary | ICD-10-CM | POA: Diagnosis not present

## 2018-08-17 DIAGNOSIS — M79671 Pain in right foot: Secondary | ICD-10-CM | POA: Diagnosis not present

## 2018-08-26 DIAGNOSIS — H43813 Vitreous degeneration, bilateral: Secondary | ICD-10-CM | POA: Diagnosis not present

## 2018-08-26 DIAGNOSIS — Z961 Presence of intraocular lens: Secondary | ICD-10-CM | POA: Diagnosis not present

## 2018-08-26 DIAGNOSIS — H52203 Unspecified astigmatism, bilateral: Secondary | ICD-10-CM | POA: Diagnosis not present

## 2018-08-26 DIAGNOSIS — H53002 Unspecified amblyopia, left eye: Secondary | ICD-10-CM | POA: Diagnosis not present

## 2018-09-01 DIAGNOSIS — J45998 Other asthma: Secondary | ICD-10-CM | POA: Diagnosis not present

## 2018-09-01 DIAGNOSIS — R0602 Shortness of breath: Secondary | ICD-10-CM | POA: Diagnosis not present

## 2018-10-02 DIAGNOSIS — R0602 Shortness of breath: Secondary | ICD-10-CM | POA: Diagnosis not present

## 2018-10-02 DIAGNOSIS — J45998 Other asthma: Secondary | ICD-10-CM | POA: Diagnosis not present

## 2018-10-04 DIAGNOSIS — B372 Candidiasis of skin and nail: Secondary | ICD-10-CM | POA: Diagnosis not present

## 2018-10-04 DIAGNOSIS — Z124 Encounter for screening for malignant neoplasm of cervix: Secondary | ICD-10-CM | POA: Diagnosis not present

## 2018-10-10 ENCOUNTER — Ambulatory Visit (INDEPENDENT_AMBULATORY_CARE_PROVIDER_SITE_OTHER): Payer: Medicare Other | Admitting: *Deleted

## 2018-10-10 VITALS — BP 136/72 | HR 53 | Resp 17 | Ht <= 58 in | Wt 215.0 lb

## 2018-10-10 DIAGNOSIS — Z0000 Encounter for general adult medical examination without abnormal findings: Secondary | ICD-10-CM

## 2018-10-10 NOTE — Patient Instructions (Addendum)
Continue doing brain stimulating activities (puzzles, reading, adult coloring books, staying active) to keep memory sharp.   Continue to eat heart healthy diet (full of fruits, vegetables, whole grains, lean protein, water--limit salt, fat, and sugar intake) and increase physical activity as tolerated.  Heather Grant , Thank you for taking time to come for your Medicare Wellness Visit. I appreciate your ongoing commitment to your health goals. Please review the following plan we discussed and let me know if I can assist you in the future.   These are the goals we discussed: Goals    . Patient Stated     Stay as independent as possible.       This is a list of the screening recommended for you and due dates:  Health Maintenance  Topic Date Due  . DEXA scan (bone density measurement)  02/13/2008  . Flu Shot  03/10/2019*  . Cologuard (Stool DNA test)  12/16/2020  . Tetanus Vaccine  08/23/2022  . Pneumonia vaccines  Completed  *Topic was postponed. The date shown is not the original due date.   Health Maintenance, Female Adopting a healthy lifestyle and getting preventive care can go a long way to promote health and wellness. Talk with your health care provider about what schedule of regular examinations is right for you. This is a good chance for you to check in with your provider about disease prevention and staying healthy. In between checkups, there are plenty of things you can do on your own. Experts have done a lot of research about which lifestyle changes and preventive measures are most likely to keep you healthy. Ask your health care provider for more information. Weight and diet Eat a healthy diet  Be sure to include plenty of vegetables, fruits, low-fat dairy products, and lean protein.  Do not eat a lot of foods high in solid fats, added sugars, or salt.  Get regular exercise. This is one of the most important things you can do for your health. ? Most adults should  exercise for at least 150 minutes each week. The exercise should increase your heart rate and make you sweat (moderate-intensity exercise). ? Most adults should also do strengthening exercises at least twice a week. This is in addition to the moderate-intensity exercise.  Maintain a healthy weight  Body mass index (BMI) is a measurement that can be used to identify possible weight problems. It estimates body fat based on height and weight. Your health care provider can help determine your BMI and help you achieve or maintain a healthy weight.  For females 78 years of age and older: ? A BMI below 18.5 is considered underweight. ? A BMI of 18.5 to 24.9 is normal. ? A BMI of 25 to 29.9 is considered overweight. ? A BMI of 30 and above is considered obese.  Watch levels of cholesterol and blood lipids  You should start having your blood tested for lipids and cholesterol at 75 years of age, then have this test every 5 years.  You may need to have your cholesterol levels checked more often if: ? Your lipid or cholesterol levels are high. ? You are older than 75 years of age. ? You are at high risk for heart disease.  Cancer screening Lung Cancer  Lung cancer screening is recommended for adults 5-55 years old who are at high risk for lung cancer because of a history of smoking.  A yearly low-dose CT scan of the lungs is recommended for people who: ?  Currently smoke. ? Have quit within the past 15 years. ? Have at least a 30-pack-year history of smoking. A pack year is smoking an average of one pack of cigarettes a day for 1 year.  Yearly screening should continue until it has been 15 years since you quit.  Yearly screening should stop if you develop a health problem that would prevent you from having lung cancer treatment.  Breast Cancer  Practice breast self-awareness. This means understanding how your breasts normally appear and feel.  It also means doing regular breast  self-exams. Let your health care provider know about any changes, no matter how small.  If you are in your 20s or 30s, you should have a clinical breast exam (CBE) by a health care provider every 1-3 years as part of a regular health exam.  If you are 76 or older, have a CBE every year. Also consider having a breast X-ray (mammogram) every year.  If you have a family history of breast cancer, talk to your health care provider about genetic screening.  If you are at high risk for breast cancer, talk to your health care provider about having an MRI and a mammogram every year.  Breast cancer gene (BRCA) assessment is recommended for women who have family members with BRCA-related cancers. BRCA-related cancers include: ? Breast. ? Ovarian. ? Tubal. ? Peritoneal cancers.  Results of the assessment will determine the need for genetic counseling and BRCA1 and BRCA2 testing.  Cervical Cancer Your health care provider may recommend that you be screened regularly for cancer of the pelvic organs (ovaries, uterus, and vagina). This screening involves a pelvic examination, including checking for microscopic changes to the surface of your cervix (Pap test). You may be encouraged to have this screening done every 3 years, beginning at age 43.  For women ages 51-65, health care providers may recommend pelvic exams and Pap testing every 3 years, or they may recommend the Pap and pelvic exam, combined with testing for human papilloma virus (HPV), every 5 years. Some types of HPV increase your risk of cervical cancer. Testing for HPV may also be done on women of any age with unclear Pap test results.  Other health care providers may not recommend any screening for nonpregnant women who are considered low risk for pelvic cancer and who do not have symptoms. Ask your health care provider if a screening pelvic exam is right for you.  If you have had past treatment for cervical cancer or a condition that could  lead to cancer, you need Pap tests and screening for cancer for at least 20 years after your treatment. If Pap tests have been discontinued, your risk factors (such as having a new sexual partner) need to be reassessed to determine if screening should resume. Some women have medical problems that increase the chance of getting cervical cancer. In these cases, your health care provider may recommend more frequent screening and Pap tests.  Colorectal Cancer  This type of cancer can be detected and often prevented.  Routine colorectal cancer screening usually begins at 75 years of age and continues through 75 years of age.  Your health care provider may recommend screening at an earlier age if you have risk factors for colon cancer.  Your health care provider may also recommend using home test kits to check for hidden blood in the stool.  A small camera at the end of a tube can be used to examine your colon directly (sigmoidoscopy or colonoscopy).  This is done to check for the earliest forms of colorectal cancer.  Routine screening usually begins at age 56.  Direct examination of the colon should be repeated every 5-10 years through 75 years of age. However, you may need to be screened more often if early forms of precancerous polyps or small growths are found.  Skin Cancer  Check your skin from head to toe regularly.  Tell your health care provider about any new moles or changes in moles, especially if there is a change in a mole's shape or color.  Also tell your health care provider if you have a mole that is larger than the size of a pencil eraser.  Always use sunscreen. Apply sunscreen liberally and repeatedly throughout the day.  Protect yourself by wearing long sleeves, pants, a wide-brimmed hat, and sunglasses whenever you are outside.  Heart disease, diabetes, and high blood pressure  High blood pressure causes heart disease and increases the risk of stroke. High blood pressure  is more likely to develop in: ? People who have blood pressure in the high end of the normal range (130-139/85-89 mm Hg). ? People who are overweight or obese. ? People who are African American.  If you are 3-68 years of age, have your blood pressure checked every 3-5 years. If you are 61 years of age or older, have your blood pressure checked every year. You should have your blood pressure measured twice-once when you are at a hospital or clinic, and once when you are not at a hospital or clinic. Record the average of the two measurements. To check your blood pressure when you are not at a hospital or clinic, you can use: ? An automated blood pressure machine at a pharmacy. ? A home blood pressure monitor.  If you are between 22 years and 7 years old, ask your health care provider if you should take aspirin to prevent strokes.  Have regular diabetes screenings. This involves taking a blood sample to check your fasting blood sugar level. ? If you are at a normal weight and have a low risk for diabetes, have this test once every three years after 75 years of age. ? If you are overweight and have a high risk for diabetes, consider being tested at a younger age or more often. Preventing infection Hepatitis B  If you have a higher risk for hepatitis B, you should be screened for this virus. You are considered at high risk for hepatitis B if: ? You were born in a country where hepatitis B is common. Ask your health care provider which countries are considered high risk. ? Your parents were born in a high-risk country, and you have not been immunized against hepatitis B (hepatitis B vaccine). ? You have HIV or AIDS. ? You use needles to inject street drugs. ? You live with someone who has hepatitis B. ? You have had sex with someone who has hepatitis B. ? You get hemodialysis treatment. ? You take certain medicines for conditions, including cancer, organ transplantation, and autoimmune  conditions.  Hepatitis C  Blood testing is recommended for: ? Everyone born from [birth year] through 1963. ? Anyone with known risk factors for hepatitis C.  Sexually transmitted infections (STIs)  You should be screened for sexually transmitted infections (STIs) including gonorrhea and chlamydia if: ? You are sexually active and are younger than 75 years of age. ? You are older than 75 years of age and your health care provider tells you that  you are at risk for this type of infection. ? Your sexual activity has changed since you were last screened and you are at an increased risk for chlamydia or gonorrhea. Ask your health care provider if you are at risk.  If you do not have HIV, but are at risk, it may be recommended that you take a prescription medicine daily to prevent HIV infection. This is called pre-exposure prophylaxis (PrEP). You are considered at risk if: ? You are sexually active and do not regularly use condoms or know the HIV status of your partner(s). ? You take drugs by injection. ? You are sexually active with a partner who has HIV.  Talk with your health care provider about whether you are at high risk of being infected with HIV. If you choose to begin PrEP, you should first be tested for HIV. You should then be tested every 3 months for as long as you are taking PrEP. Pregnancy  If you are premenopausal and you may become pregnant, ask your health care provider about preconception counseling.  If you may become pregnant, take 400 to 800 micrograms (mcg) of folic acid every day.  If you want to prevent pregnancy, talk to your health care provider about birth control (contraception). Osteoporosis and menopause  Osteoporosis is a disease in which the bones lose minerals and strength with aging. This can result in serious bone fractures. Your risk for osteoporosis can be identified using a bone density scan.  If you are 35 years of age or older, or if you are at risk for  osteoporosis and fractures, ask your health care provider if you should be screened.  Ask your health care provider whether you should take a calcium or vitamin D supplement to lower your risk for osteoporosis.  Menopause may have certain physical symptoms and risks.  Hormone replacement therapy may reduce some of these symptoms and risks. Talk to your health care provider about whether hormone replacement therapy is right for you. Follow these instructions at home:  Schedule regular health, dental, and eye exams.  Stay current with your immunizations.  Do not use any tobacco products including cigarettes, chewing tobacco, or electronic cigarettes.  If you are pregnant, do not drink alcohol.  If you are breastfeeding, limit how much and how often you drink alcohol.  Limit alcohol intake to no more than 1 drink per day for nonpregnant women. One drink equals 12 ounces of beer, 5 ounces of Lurlean Kernen, or 1 ounces of hard liquor.  Do not use street drugs.  Do not share needles.  Ask your health care provider for help if you need support or information about quitting drugs.  Tell your health care provider if you often feel depressed.  Tell your health care provider if you have ever been abused or do not feel safe at home. This information is not intended to replace advice given to you by your health care provider. Make sure you discuss any questions you have with your health care provider. Document Released: 06/01/2011 Document Revised: 04/23/2016 Document Reviewed: 08/20/2015 Elsevier Interactive Patient Education  [employer].

## 2018-10-10 NOTE — Progress Notes (Signed)
Medical screening examination/treatment/procedure(s) were performed by non-physician practitioner and as supervising physician I was immediately available for consultation/collaboration. I agree with above. Adrien Dietzman A Alli Jasmer, MD 
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
## 2018-10-18 DIAGNOSIS — M8580 Other specified disorders of bone density and structure, unspecified site: Secondary | ICD-10-CM | POA: Diagnosis not present

## 2018-10-18 DIAGNOSIS — E559 Vitamin D deficiency, unspecified: Secondary | ICD-10-CM | POA: Diagnosis not present

## 2018-10-18 DIAGNOSIS — M179 Osteoarthritis of knee, unspecified: Secondary | ICD-10-CM | POA: Diagnosis not present

## 2018-10-26 ENCOUNTER — Other Ambulatory Visit: Payer: Self-pay | Admitting: Internal Medicine

## 2018-11-01 DIAGNOSIS — J45998 Other asthma: Secondary | ICD-10-CM | POA: Diagnosis not present

## 2018-11-01 DIAGNOSIS — R0602 Shortness of breath: Secondary | ICD-10-CM | POA: Diagnosis not present

## 2018-11-17 DIAGNOSIS — M25571 Pain in right ankle and joints of right foot: Secondary | ICD-10-CM | POA: Diagnosis not present

## 2018-11-17 DIAGNOSIS — M79671 Pain in right foot: Secondary | ICD-10-CM | POA: Diagnosis not present

## 2018-11-24 DIAGNOSIS — M170 Bilateral primary osteoarthritis of knee: Secondary | ICD-10-CM | POA: Diagnosis not present

## 2018-12-02 DIAGNOSIS — R0602 Shortness of breath: Secondary | ICD-10-CM | POA: Diagnosis not present

## 2018-12-02 DIAGNOSIS — J45998 Other asthma: Secondary | ICD-10-CM | POA: Diagnosis not present

## 2018-12-05 ENCOUNTER — Other Ambulatory Visit: Payer: Self-pay | Admitting: Internal Medicine

## 2018-12-05 DIAGNOSIS — J45901 Unspecified asthma with (acute) exacerbation: Secondary | ICD-10-CM

## 2018-12-29 ENCOUNTER — Other Ambulatory Visit: Payer: Self-pay | Admitting: Internal Medicine

## 2018-12-29 DIAGNOSIS — J45901 Unspecified asthma with (acute) exacerbation: Secondary | ICD-10-CM

## 2018-12-29 NOTE — Telephone Encounter (Signed)
Requested medication (s) are due for refill today: yes  Requested medication (s) are on the active medication list: yes  Last refill:  Last filled by Dr. Melvyn Novas  Future visit scheduled: No  Notes to clinic:  Medication last filled by Dr. Melvyn Novas    Requested Prescriptions  Pending Prescriptions Disp Refills   mometasone-formoterol (DULERA) 100-5 MCG/ACT AERO 1 Inhaler 3    Sig: TAKE 2 PUFFS FIRST THING IN AM AND THEN ANOTHER 2 PUFFS ABOUT 12 HOURS LATER.     Pulmonology:  Combination Products Passed - 12/29/2018  3:57 PM      Passed - Valid encounter within last 12 months    Recent Outpatient Visits          8 months ago Primary osteoarthritis involving multiple joints   [facility] -Chuck Hint, MD   1 year ago Routine general medical examination at a health care facility   [place], Elizabeth A, MD   1 year ago Postural dizziness with presyncope   [place], Enid Baas, MD   1 year ago Right foot pain   [place], Elizabeth A, MD   2 years ago Chronic venous insufficiency   [place], MD      Future Appointments            In 9 months [facility]           albuterol (PROVENTIL HFA;VENTOLIN HFA) 108 (90 Base) MCG/ACT inhaler 8.5 Inhaler 2    Sig: Inhale 2 puffs into the lungs every 6 (six) hours as needed for wheezing.     Pulmonology:  Beta Agonists Failed - 12/29/2018  3:57 PM      Failed - One inhaler should last at least one month. If the patient is requesting refills earlier, contact the patient to check for uncontrolled symptoms.      Passed - Valid encounter within last 12 months    Recent Outpatient Visits          8 months ago Primary osteoarthritis involving multiple joints   [facility] -Chuck Hint, MD    1 year ago Routine general medical examination at a health care facility   [place], Elizabeth A, MD   1 year ago Postural dizziness with presyncope   [place], Enid Baas, MD   1 year ago Right foot pain   [place], Elizabeth A, MD   2 years ago Chronic venous insufficiency   [place], MD      Future Appointments            In 38 months [place], [facility]

## 2018-12-29 NOTE — Telephone Encounter (Signed)
Copied from [place] [id number]. Topic: Quick Communication - Rx Refill/Question >> Dec 29, 2018  3:46 PM Keene Breath wrote: Medication: albuterol (PROVENTIL HFA;VENTOLIN HFA) 108 (90 Base) MCG/ACT inhaler / mometasone-formoterol (DULERA) 100-5 MCG/ACT AERO  Patient called to request a refill for the above medications  Preferred Pharmacy (with phone number or street name): [id number] [address], [place] [phone] (Phone) [phone] (Fax)

## 2018-12-30 MED ORDER — MOMETASONE FURO-FORMOTEROL FUM 100-5 MCG/ACT IN AERO: INHALATION_SPRAY | RESPIRATORY_TRACT | 0 refills | Status: DC

## 2018-12-30 MED ORDER — ALBUTEROL SULFATE HFA 108 (90 BASE) MCG/ACT IN AERS: 2.0000 | INHALATION_SPRAY | Freq: Four times a day (QID) | RESPIRATORY_TRACT | 0 refills | Status: DC | PRN

## 2019-01-02 DIAGNOSIS — J45998 Other asthma: Secondary | ICD-10-CM | POA: Diagnosis not present

## 2019-01-02 DIAGNOSIS — R0602 Shortness of breath: Secondary | ICD-10-CM | POA: Diagnosis not present

## 2019-01-03 ENCOUNTER — Encounter: Payer: Self-pay | Admitting: Internal Medicine

## 2019-01-03 ENCOUNTER — Other Ambulatory Visit: Payer: Self-pay | Admitting: Internal Medicine

## 2019-01-03 ENCOUNTER — Ambulatory Visit (INDEPENDENT_AMBULATORY_CARE_PROVIDER_SITE_OTHER): Payer: Medicare Other | Admitting: Internal Medicine

## 2019-01-03 DIAGNOSIS — J069 Acute upper respiratory infection, unspecified: Secondary | ICD-10-CM | POA: Insufficient documentation

## 2019-01-03 DIAGNOSIS — B9789 Other viral agents as the cause of diseases classified elsewhere: Secondary | ICD-10-CM

## 2019-01-03 MED ORDER — PROMETHAZINE-DM 6.25-15 MG/5ML PO SYRP: 5.0000 mL | ORAL_SOLUTION | Freq: Four times a day (QID) | ORAL | 0 refills | Status: DC | PRN

## 2019-01-03 NOTE — Progress Notes (Signed)
   Subjective:   Patient ID: Heather Grant, female    DOB: 06-Oct-1941, 78 y.o.   MRN: [id number]  HPI The patient is a 78 y.o. female coming in for cold symptoms. Started about 3-4 days ago. Main symptoms are: cough, nose drainage. Denies SOB or sinus pressure, denies fevers or chills. Overall it is improving gradually. Has tried alka seltzer cold and flu which she is not sure if that helped or not. She denies needing albuterol more often lately. Is somewhat tired.   Review of Systems  Constitutional: Positive for activity change and appetite change. Negative for chills, fatigue, fever and unexpected weight change.  HENT: Positive for congestion, postnasal drip and rhinorrhea. Negative for ear discharge, ear pain, sinus pressure, sinus pain, sneezing, sore throat, tinnitus, trouble swallowing and voice change.   Eyes: Negative.   Respiratory: Positive for cough. Negative for chest tightness, shortness of breath and wheezing.   Cardiovascular: Negative.   Gastrointestinal: Negative.   Musculoskeletal: Positive for myalgias.  Neurological: Negative.     Objective:  Physical Exam Constitutional:      Appearance: She is well-developed.  HENT:     Head: Normocephalic and atraumatic.     Comments: Oropharynx with redness and clear drainage, nose with swollen turbinates, TMs normal bilaterally.  Neck:     Musculoskeletal: Normal range of motion.     Thyroid: No thyromegaly.  Cardiovascular:     Rate and Rhythm: Normal rate and regular rhythm.  Pulmonary:     Effort: Pulmonary effort is normal. No respiratory distress.     Breath sounds: Normal breath sounds. No wheezing or rales.  Abdominal:     Palpations: Abdomen is soft.  Musculoskeletal:        General: Tenderness present.  Lymphadenopathy:     Cervical: No cervical adenopathy.  Skin:    General: Skin is warm and dry.  Neurological:     Mental Status: She is alert and oriented to person, place, and time.     Vitals:     01/03/19 1518  BP: 130/68  Pulse: (!) 57  Temp: 98.1 F (36.7 C)  TempSrc: Oral  SpO2: 98%  Weight: 206 lb (93.4 kg)  Height: 4\' 9"  (1.448 m)    Assessment & Plan:

## 2019-01-03 NOTE — Assessment & Plan Note (Signed)
Rx for promethazine/dm cough medicine. Symptoms improving and outside of window for tamiflu so poc flu testing not done. Advised to start taking zyrtec. Continue dulera and use albuterol prn.

## 2019-01-03 NOTE — Patient Instructions (Addendum)
We have recommended to start taking zyrtec (cetirizine) 1 pill daily for the next 1-2 weeks.   We have sent in cough medicine to use at night time if needed.    Viral Respiratory Infection A viral respiratory infection is an illness that affects parts of the body that are used for breathing. These include the lungs, nose, and throat. It is caused by a germ called a virus. Some examples of this kind of infection are:  A cold.  The flu (influenza).  A respiratory syncytial virus (RSV) infection. A person who gets this illness may have the following symptoms:  A stuffy or runny nose.  Yellow or green fluid in the nose.  A cough.  Sneezing.  Tiredness (fatigue).  Achy muscles.  A sore throat.  Sweating or chills.  A fever.  A headache. Follow these instructions at home: Managing pain and congestion  Take over-the-counter and prescription medicines only as told by your doctor.  If you have a sore throat, gargle with salt water. Do this 3-4 times per day or as needed. To make a salt-water mixture, dissolve -1 tsp of salt in 1 cup of warm water. Make sure that all the salt dissolves.  Use nose drops made from salt water. This helps with stuffiness (congestion). It also helps soften the skin around your nose.  Drink enough fluid to keep your pee (urine) pale yellow. General instructions   Rest as much as possible.  Do not drink alcohol.  Do not use any products that have nicotine or tobacco, such as cigarettes and e-cigarettes. If you need help quitting, ask your doctor.  Keep all follow-up visits as told by your doctor. This is important. How is this prevented?   Get a flu shot every year. Ask your doctor when you should get your flu shot.  Do not let other people get your germs. If you are sick: ? Stay home from work or school. ? Wash your hands with soap and water often. Wash your hands after you cough or sneeze. If soap and water are not available, use hand  sanitizer.  Avoid contact with people who are sick during cold and flu season. This is in fall and winter. Get help if:  Your symptoms last for 10 days or longer.  Your symptoms get worse over time.  You have a fever.  You have very bad pain in your face or forehead.  Parts of your jaw or neck become very swollen. Get help right away if:  You feel pain or pressure in your chest.  You have shortness of breath.  You faint or feel like you will faint.  You keep throwing up (vomiting).  You feel confused. Summary  A viral respiratory infection is an illness that affects parts of the body that are used for breathing.  Examples of this illness include a cold, the flu, and respiratory syncytial virus (RSV) infection.  The infection can cause a runny nose, cough, sneezing, sore throat, and fever.  Follow what your doctor tells you about taking medicines, drinking lots of fluid, washing your hands, resting at home, and avoiding people who are sick. This information is not intended to replace advice given to you by your health care provider. Make sure you discuss any questions you have with your health care provider. Document Released: 10/29/2008 Document Revised: 12/27/2017 Document Reviewed: 12/27/2017 Elsevier Interactive Patient Education  2019 [employer].

## 2019-01-20 ENCOUNTER — Other Ambulatory Visit: Payer: Self-pay | Admitting: Internal Medicine

## 2019-01-20 DIAGNOSIS — J45901 Unspecified asthma with (acute) exacerbation: Secondary | ICD-10-CM

## 2019-01-24 ENCOUNTER — Other Ambulatory Visit: Payer: Self-pay | Admitting: Internal Medicine

## 2019-01-31 DIAGNOSIS — R0602 Shortness of breath: Secondary | ICD-10-CM | POA: Diagnosis not present

## 2019-01-31 DIAGNOSIS — J45998 Other asthma: Secondary | ICD-10-CM | POA: Diagnosis not present

## 2019-02-08 ENCOUNTER — Telehealth: Payer: Self-pay | Admitting: Internal Medicine

## 2019-02-08 DIAGNOSIS — M150 Primary generalized (osteo)arthritis: Principal | ICD-10-CM

## 2019-02-08 DIAGNOSIS — M8949 Other hypertrophic osteoarthropathy, multiple sites: Secondary | ICD-10-CM

## 2019-02-08 DIAGNOSIS — M159 Polyosteoarthritis, unspecified: Secondary | ICD-10-CM

## 2019-02-08 NOTE — Telephone Encounter (Signed)
Copied from [place] [id number]. Topic: Quick Communication - See Telephone Encounter >> Feb 08, 2019  1:38 PM Heather Grant wrote: CRM for notification. See Telephone encounter for: 02/08/19.  Patient would like to know could Dr Sharlet Salina put in an order for her to have a walker with rollers on it. She said she does not know where she should have this sent? Please Advise. Also she states that meloxicam (MOBIC) 15 MG tablet is not working for her knee pain and wants to know could she go back on HYDROcodone-acetaminophen (NORCO/VICODIN) 5-325 MG tablet

## 2019-02-10 NOTE — Telephone Encounter (Signed)
LVM asking patient to call back to make appt for medication

## 2019-02-10 NOTE — Telephone Encounter (Signed)
Order placed for walker with seat, please inform advanced. Needs visit for hydrocodone as this is a controlled substance to discuss.

## 2019-02-10 NOTE — Telephone Encounter (Signed)
Can you please schedule patient for an appointment for for hydrocodone. Thank you

## 2019-02-13 ENCOUNTER — Ambulatory Visit: Payer: Medicare Other | Admitting: Internal Medicine

## 2019-02-27 ENCOUNTER — Other Ambulatory Visit: Payer: Self-pay | Admitting: Internal Medicine

## 2019-03-03 DIAGNOSIS — R0602 Shortness of breath: Secondary | ICD-10-CM | POA: Diagnosis not present

## 2019-03-03 DIAGNOSIS — J45998 Other asthma: Secondary | ICD-10-CM | POA: Diagnosis not present

## 2019-03-26 ENCOUNTER — Other Ambulatory Visit: Payer: Self-pay | Admitting: Internal Medicine

## 2019-03-26 DIAGNOSIS — J45901 Unspecified asthma with (acute) exacerbation: Secondary | ICD-10-CM

## 2019-03-27 ENCOUNTER — Other Ambulatory Visit: Payer: Self-pay | Admitting: Internal Medicine

## 2019-03-28 ENCOUNTER — Other Ambulatory Visit: Payer: Self-pay | Admitting: Internal Medicine

## 2019-04-01 ENCOUNTER — Other Ambulatory Visit: Payer: Self-pay | Admitting: Internal Medicine

## 2019-04-02 ENCOUNTER — Other Ambulatory Visit: Payer: Self-pay | Admitting: Internal Medicine

## 2019-04-02 DIAGNOSIS — J45998 Other asthma: Secondary | ICD-10-CM | POA: Diagnosis not present

## 2019-04-02 DIAGNOSIS — R0602 Shortness of breath: Secondary | ICD-10-CM | POA: Diagnosis not present

## 2019-04-28 DIAGNOSIS — M170 Bilateral primary osteoarthritis of knee: Secondary | ICD-10-CM | POA: Diagnosis not present

## 2019-05-03 DIAGNOSIS — J45998 Other asthma: Secondary | ICD-10-CM | POA: Diagnosis not present

## 2019-05-03 DIAGNOSIS — R0602 Shortness of breath: Secondary | ICD-10-CM | POA: Diagnosis not present

## 2019-05-28 ENCOUNTER — Other Ambulatory Visit: Payer: Self-pay | Admitting: Internal Medicine

## 2019-06-02 DIAGNOSIS — R0602 Shortness of breath: Secondary | ICD-10-CM | POA: Diagnosis not present

## 2019-06-02 DIAGNOSIS — J45998 Other asthma: Secondary | ICD-10-CM | POA: Diagnosis not present

## 2019-06-18 ENCOUNTER — Other Ambulatory Visit: Payer: Self-pay | Admitting: Internal Medicine

## 2019-06-22 ENCOUNTER — Other Ambulatory Visit: Payer: Self-pay | Admitting: Internal Medicine

## 2019-07-03 DIAGNOSIS — J45998 Other asthma: Secondary | ICD-10-CM | POA: Diagnosis not present

## 2019-07-03 DIAGNOSIS — R0602 Shortness of breath: Secondary | ICD-10-CM | POA: Diagnosis not present

## 2019-07-13 ENCOUNTER — Other Ambulatory Visit: Payer: Self-pay | Admitting: Internal Medicine

## 2019-07-20 ENCOUNTER — Other Ambulatory Visit: Payer: Self-pay | Admitting: Family

## 2019-07-25 ENCOUNTER — Ambulatory Visit: Payer: Medicare Other | Admitting: Internal Medicine

## 2019-07-27 DIAGNOSIS — M170 Bilateral primary osteoarthritis of knee: Secondary | ICD-10-CM | POA: Diagnosis not present

## 2019-08-02 ENCOUNTER — Telehealth: Payer: Self-pay

## 2019-08-02 NOTE — Telephone Encounter (Signed)
Patient has been informed and screened for appointment.

## 2019-08-02 NOTE — Telephone Encounter (Signed)
Copied from [place] [id number]. Topic: General - Inquiry >> Aug 02, 2019  9:38 AM Mathis Bud wrote: Reason for CRM: Patient called stating she got voicemail stating if she could do a virtual. Patient would like to come in but is open to virtual but it can only be a telephone call. Patient just wants to make sure all medications are refilled and would like to know what shots she needs  Patient call back [phone] >> Aug 02, 2019  9:55 AM Para Skeans A wrote: Did you want this to be a DOXY?

## 2019-08-02 NOTE — Telephone Encounter (Signed)
No it does not need to be a doxy, I didn't call. She can come in as long as symptom free

## 2019-08-03 ENCOUNTER — Ambulatory Visit (INDEPENDENT_AMBULATORY_CARE_PROVIDER_SITE_OTHER): Payer: Medicare Other | Admitting: Internal Medicine

## 2019-08-03 ENCOUNTER — Other Ambulatory Visit: Payer: Self-pay

## 2019-08-03 ENCOUNTER — Other Ambulatory Visit (INDEPENDENT_AMBULATORY_CARE_PROVIDER_SITE_OTHER): Payer: Medicare Other

## 2019-08-03 ENCOUNTER — Encounter: Payer: Self-pay | Admitting: Internal Medicine

## 2019-08-03 VITALS — BP 122/84 | HR 55 | Temp 98.3°F | Ht <= 58 in | Wt 206.0 lb

## 2019-08-03 DIAGNOSIS — J45901 Unspecified asthma with (acute) exacerbation: Secondary | ICD-10-CM | POA: Diagnosis not present

## 2019-08-03 DIAGNOSIS — M150 Primary generalized (osteo)arthritis: Secondary | ICD-10-CM | POA: Diagnosis not present

## 2019-08-03 DIAGNOSIS — M159 Polyosteoarthritis, unspecified: Secondary | ICD-10-CM

## 2019-08-03 DIAGNOSIS — Z0000 Encounter for general adult medical examination without abnormal findings: Secondary | ICD-10-CM

## 2019-08-03 DIAGNOSIS — M1A0710 Idiopathic chronic gout, right ankle and foot, without tophus (tophi): Secondary | ICD-10-CM

## 2019-08-03 DIAGNOSIS — J45998 Other asthma: Secondary | ICD-10-CM | POA: Diagnosis not present

## 2019-08-03 DIAGNOSIS — J4520 Mild intermittent asthma, uncomplicated: Secondary | ICD-10-CM

## 2019-08-03 DIAGNOSIS — I10 Essential (primary) hypertension: Secondary | ICD-10-CM

## 2019-08-03 DIAGNOSIS — M8949 Other hypertrophic osteoarthropathy, multiple sites: Secondary | ICD-10-CM

## 2019-08-03 DIAGNOSIS — E782 Mixed hyperlipidemia: Secondary | ICD-10-CM

## 2019-08-03 DIAGNOSIS — R0602 Shortness of breath: Secondary | ICD-10-CM | POA: Diagnosis not present

## 2019-08-03 DIAGNOSIS — K219 Gastro-esophageal reflux disease without esophagitis: Secondary | ICD-10-CM

## 2019-08-03 LAB — COMPREHENSIVE METABOLIC PANEL
ALT: 13 U/L (ref 0–35)
AST: 12 U/L (ref 0–37)
Albumin: 4.1 g/dL (ref 3.5–5.2)
Alkaline Phosphatase: 56 U/L (ref 39–117)
BUN: 19 mg/dL (ref 6–23)
CO2: 33 mEq/L — ABNORMAL HIGH (ref 19–32)
Calcium: 9.9 mg/dL (ref 8.4–10.5)
Chloride: 105 mEq/L (ref 96–112)
Creatinine, Ser: 1.18 mg/dL (ref 0.40–1.20)
GFR: 53.82 mL/min — ABNORMAL LOW (ref >60.00)
Glucose, Bld: 99 mg/dL (ref 70–99)
Potassium: 4.4 mEq/L (ref 3.5–5.1)
Sodium: 144 mEq/L (ref 135–145)
Total Bilirubin: 0.6 mg/dL (ref 0.2–1.2)
Total Protein: 6.9 g/dL (ref 6.0–8.3)

## 2019-08-03 LAB — CBC
HCT: 40.7 % (ref 36.0–46.0)
Hemoglobin: 13.5 g/dL (ref 12.0–15.0)
MCHC: 33.2 g/dL (ref 30.0–36.0)
MCV: 94.3 fl (ref 78.0–100.0)
Platelets: 356 10*3/uL (ref 150.0–400.0)
RBC: 4.32 Mil/uL (ref 3.87–5.11)
RDW: 12.9 % (ref 11.5–15.5)
WBC: 8 10*3/uL (ref 4.0–10.5)

## 2019-08-03 LAB — HEMOGLOBIN A1C: Hgb A1c MFr Bld: 5.1 % (ref 4.6–6.5)

## 2019-08-03 LAB — LIPID PANEL
Cholesterol: 214 mg/dL — ABNORMAL HIGH (ref 0–200)
HDL: 51.8 mg/dL (ref >39.00)
LDL Cholesterol: 140 mg/dL — ABNORMAL HIGH (ref 0–99)
NonHDL: 162.53
Total CHOL/HDL Ratio: 4
Triglycerides: 112 mg/dL (ref 0.0–149.0)
VLDL: 22.4 mg/dL (ref 0.0–40.0)

## 2019-08-03 MED ORDER — DULERA 100-5 MCG/ACT IN AERO: 2.0000 | INHALATION_SPRAY | Freq: Two times a day (BID) | RESPIRATORY_TRACT | 11 refills | Status: DC

## 2019-08-03 MED ORDER — GABAPENTIN 100 MG PO CAPS: 100.0000 mg | ORAL_CAPSULE | Freq: Two times a day (BID) | ORAL | 3 refills | Status: DC | PRN

## 2019-08-03 MED ORDER — HYDRALAZINE HCL 25 MG PO TABS: 25.0000 mg | ORAL_TABLET | Freq: Three times a day (TID) | ORAL | 3 refills | Status: DC

## 2019-08-03 MED ORDER — FAMOTIDINE 20 MG PO TABS: ORAL_TABLET | ORAL | 3 refills | Status: DC

## 2019-08-03 MED ORDER — METOPROLOL TARTRATE 25 MG PO TABS: 25.0000 mg | ORAL_TABLET | Freq: Two times a day (BID) | ORAL | 3 refills | Status: DC

## 2019-08-03 MED ORDER — PANTOPRAZOLE SODIUM 40 MG PO TBEC: DELAYED_RELEASE_TABLET | ORAL | 3 refills | Status: DC

## 2019-08-03 MED ORDER — ALBUTEROL SULFATE HFA 108 (90 BASE) MCG/ACT IN AERS: 1.0000 | INHALATION_SPRAY | Freq: Four times a day (QID) | RESPIRATORY_TRACT | 3 refills | Status: DC | PRN

## 2019-08-03 NOTE — Assessment & Plan Note (Signed)
Refilled protonix and pepcid which are managing symptoms.

## 2019-08-03 NOTE — Progress Notes (Signed)
   Subjective:   Patient ID: Heather Grant, female    DOB: 31-May-1941, 78 y.o.   MRN: [id number]  HPI The patient is a 78 YO female coming in for physical. Some new concerns.   PMH, [facility], social history reviewed and updated  Review of Systems  Constitutional: Positive for activity change. Negative for appetite change, chills, fatigue and unexpected weight change.  HENT: Negative.   Eyes: Negative.   Respiratory: Negative for cough, chest tightness and shortness of breath.   Cardiovascular: Negative for chest pain, palpitations and leg swelling.  Gastrointestinal: Negative for abdominal distention, abdominal pain, constipation, diarrhea, nausea and vomiting.  Musculoskeletal: Positive for arthralgias, gait problem, joint swelling and myalgias.  Skin: Negative.   Neurological: Negative for dizziness, tremors, weakness, light-headedness and headaches.  Psychiatric/Behavioral: Negative.     Objective:  Physical Exam Constitutional:      Appearance: She is well-developed. She is obese.  HENT:     Head: Normocephalic and atraumatic.  Neck:     Musculoskeletal: Normal range of motion.  Cardiovascular:     Rate and Rhythm: Normal rate and regular rhythm.  Pulmonary:     Effort: Pulmonary effort is normal. No respiratory distress.     Breath sounds: Normal breath sounds. No wheezing or rales.  Abdominal:     General: Bowel sounds are normal. There is no distension.     Palpations: Abdomen is soft.     Tenderness: There is no abdominal tenderness. There is no rebound.  Musculoskeletal:        General: Tenderness present.  Skin:    General: Skin is warm and dry.  Neurological:     Mental Status: She is alert and oriented to person, place, and time.     Coordination: Coordination abnormal.     Comments: Wheeled walker with seat for ambulation     Vitals:   08/03/19 1053  BP: 122/84  Pulse: (!) 55  Temp: 98.3 F (36.8 C)  TempSrc: Oral  SpO2: 98%  Weight: 206 lb  (93.4 kg)  Height: 4\' 9"  (1.448 m)    Assessment & Plan:

## 2019-08-03 NOTE — Assessment & Plan Note (Signed)
Refill dulera and albuterol, no flare today. She declines flu shot even after counseling. Reminded about covid-19 prevention.

## 2019-08-03 NOTE — Assessment & Plan Note (Signed)
Flu shot declines. Pneumonia complete. Shingrix counseled. Tetanus due 2023. Colonoscopy done 2019 and aged out before next screening. Mammogram aged out, pap smear aged out and dexa declines. Counseled about sun safety and mole surveillance. Counseled about the dangers of distracted driving. Given 10 year screening recommendations.

## 2019-08-03 NOTE — Assessment & Plan Note (Signed)
Ordered lift chair to assist in lowering risk of falls with standing. She does have severe OA and pain.

## 2019-08-03 NOTE — Patient Instructions (Signed)
Seriously think about getting the flu shot this year to help protect yourself. This is a once in a lifetime year.   We are doing the blood work today.    Health Maintenance, Female Adopting a healthy lifestyle and getting preventive care are important in promoting health and wellness. Ask your health care provider about:  The right schedule for you to have regular tests and exams.  Things you can do on your own to prevent diseases and keep yourself healthy. What should I know about diet, weight, and exercise? Eat a healthy diet   Eat a diet that includes plenty of vegetables, fruits, low-fat dairy products, and lean protein.  Do not eat a lot of foods that are high in solid fats, added sugars, or sodium. Maintain a healthy weight Body mass index (BMI) is used to identify weight problems. It estimates body fat based on height and weight. Your health care provider can help determine your BMI and help you achieve or maintain a healthy weight. Get regular exercise Get regular exercise. This is one of the most important things you can do for your health. Most adults should:  Exercise for at least 150 minutes each week. The exercise should increase your heart rate and make you sweat (moderate-intensity exercise).  Do strengthening exercises at least twice a week. This is in addition to the moderate-intensity exercise.  Spend less time sitting. Even light physical activity can be beneficial. Watch cholesterol and blood lipids Have your blood tested for lipids and cholesterol at 76 years of age, then have this test every 5 years. Have your cholesterol levels checked more often if:  Your lipid or cholesterol levels are high.  You are older than 76 years of age.  You are at high risk for heart disease. What should I know about cancer screening? Depending on your health history and family history, you may need to have cancer screening at various ages. This may include screening for:   Breast cancer.  Cervical cancer.  Colorectal cancer.  Skin cancer.  Lung cancer. What should I know about heart disease, diabetes, and high blood pressure? Blood pressure and heart disease  High blood pressure causes heart disease and increases the risk of stroke. This is more likely to develop in people who have high blood pressure readings, are of African descent, or are overweight.  Have your blood pressure checked: ? Every 3-5 years if you are 11-32 years of age. ? Every year if you are 76 years old or older. Diabetes Have regular diabetes screenings. This checks your fasting blood sugar level. Have the screening done:  Once every three years after age 52 if you are at a normal weight and have a low risk for diabetes.  More often and at a younger age if you are overweight or have a high risk for diabetes. What should I know about preventing infection? Hepatitis B If you have a higher risk for hepatitis B, you should be screened for this virus. Talk with your health care provider to find out if you are at risk for hepatitis B infection. Hepatitis C Testing is recommended for:  Everyone born from [birth year] through 1963.  Anyone with known risk factors for hepatitis C. Sexually transmitted infections (STIs)  Get screened for STIs, including gonorrhea and chlamydia, if: ? You are sexually active and are younger than 76 years of age. ? You are older than 76 years of age and your health care provider tells you that you are at  risk for this type of infection. ? Your sexual activity has changed since you were last screened, and you are at increased risk for chlamydia or gonorrhea. Ask your health care provider if you are at risk.  Ask your health care provider about whether you are at high risk for HIV. Your health care provider may recommend a prescription medicine to help prevent HIV infection. If you choose to take medicine to prevent HIV, you should first get tested for HIV. You  should then be tested every 3 months for as long as you are taking the medicine. Pregnancy  If you are about to stop having your period (premenopausal) and you may become pregnant, seek counseling before you get pregnant.  Take 400 to 800 micrograms (mcg) of folic acid every day if you become pregnant.  Ask for birth control (contraception) if you want to prevent pregnancy. Osteoporosis and menopause Osteoporosis is a disease in which the bones lose minerals and strength with aging. This can result in bone fractures. If you are 25 years old or older, or if you are at risk for osteoporosis and fractures, ask your health care provider if you should:  Be screened for bone loss.  Take a calcium or vitamin D supplement to lower your risk of fractures.  Be given hormone replacement therapy (HRT) to treat symptoms of menopause. Follow these instructions at home: Lifestyle  Do not use any products that contain nicotine or tobacco, such as cigarettes, e-cigarettes, and chewing tobacco. If you need help quitting, ask your health care provider.  Do not use street drugs.  Do not share needles.  Ask your health care provider for help if you need support or information about quitting drugs. Alcohol use  Do not drink alcohol if: ? Your health care provider tells you not to drink. ? You are pregnant, may be pregnant, or are planning to become pregnant.  If you drink alcohol: ? Limit how much you use to 0-1 drink a day. ? Limit intake if you are breastfeeding.  Be aware of how much alcohol is in your drink. In the [place] one drink equals one 12 oz bottle of beer (355 mL), one 5 oz glass of wine (148 mL), or one 1 oz glass of hard liquor (44 mL). General instructions  Schedule regular health, dental, and eye exams.  Stay current with your vaccines.  Tell your health care provider if: ? You often feel depressed. ? You have ever been abused or do not feel safe at home. Summary  Adopting a  healthy lifestyle and getting preventive care are important in promoting health and wellness.  Follow your health care provider's instructions about healthy diet, exercising, and getting tested or screened for diseases.  Follow your health care provider's instructions on monitoring your cholesterol and blood pressure. This information is not intended to replace advice given to you by your health care provider. Make sure you discuss any questions you have with your health care provider. Document Released: 06/01/2011 Document Revised: 11/09/2018 Document Reviewed: 11/09/2018 Elsevier Patient Education  2020 [employer].

## 2019-08-03 NOTE — Assessment & Plan Note (Signed)
BP at goal on hydralazine and metoprolol. Checking CMP and adjust as needed.  

## 2019-08-03 NOTE — Assessment & Plan Note (Signed)
Refuses statin due to perceived intolerance. Checking lipid panel.

## 2019-08-03 NOTE — Assessment & Plan Note (Signed)
Takes colchicine only as needed for flare. No flare currently.

## 2019-08-03 NOTE — Assessment & Plan Note (Signed)
Counseled about weight, she is aware and working on this.

## 2019-08-10 ENCOUNTER — Other Ambulatory Visit: Payer: Self-pay | Admitting: Internal Medicine

## 2019-08-10 ENCOUNTER — Telehealth: Payer: Self-pay | Admitting: Internal Medicine

## 2019-08-10 DIAGNOSIS — E785 Hyperlipidemia, unspecified: Secondary | ICD-10-CM | POA: Insufficient documentation

## 2019-08-10 MED ORDER — ROSUVASTATIN CALCIUM 10 MG PO TABS: 10.0000 mg | ORAL_TABLET | Freq: Every day | ORAL | 1 refills | Status: DC

## 2019-08-10 NOTE — Telephone Encounter (Signed)
Copied from [place] [id number]. Topic: General - Inquiry >> Aug 09, 2019 11:48 AM Mathis Bud wrote: Reason for CRM: Patient is confused about her new medications, she was under the impression she was getting a new cholesterol medication and the pharmacy states she does not have any medications for cholesterol. [id number] [address], [place] [phone] (Phone) [phone] (Fax  Patient call back [phone] >> Aug 10, 2019  9:52 AM Alanda Slim E wrote: Pt called to inquire about a new cholesterol medication she was to start and was advised by the Pharmacy that nothing for cholesterol was sent in/ please advise PT of the new medication and if it was sent

## 2019-08-10 NOTE — Telephone Encounter (Signed)
RX sent  TJ 

## 2019-08-31 ENCOUNTER — Ambulatory Visit (INDEPENDENT_AMBULATORY_CARE_PROVIDER_SITE_OTHER): Payer: Medicare Other | Admitting: Podiatry

## 2019-08-31 ENCOUNTER — Other Ambulatory Visit: Payer: Self-pay | Admitting: Podiatry

## 2019-08-31 ENCOUNTER — Other Ambulatory Visit: Payer: Self-pay

## 2019-08-31 ENCOUNTER — Encounter: Payer: Self-pay | Admitting: Podiatry

## 2019-08-31 ENCOUNTER — Ambulatory Visit (INDEPENDENT_AMBULATORY_CARE_PROVIDER_SITE_OTHER): Payer: Medicare Other

## 2019-08-31 VITALS — BP 153/74 | HR 56 | Resp 16

## 2019-08-31 DIAGNOSIS — M722 Plantar fascial fibromatosis: Secondary | ICD-10-CM | POA: Insufficient documentation

## 2019-08-31 DIAGNOSIS — M79676 Pain in unspecified toe(s): Secondary | ICD-10-CM

## 2019-08-31 DIAGNOSIS — B351 Tinea unguium: Secondary | ICD-10-CM | POA: Diagnosis not present

## 2019-08-31 DIAGNOSIS — G5792 Unspecified mononeuropathy of left lower limb: Secondary | ICD-10-CM

## 2019-08-31 DIAGNOSIS — M8580 Other specified disorders of bone density and structure, unspecified site: Secondary | ICD-10-CM | POA: Insufficient documentation

## 2019-08-31 DIAGNOSIS — M778 Other enthesopathies, not elsewhere classified: Secondary | ICD-10-CM

## 2019-08-31 DIAGNOSIS — R519 Headache, unspecified: Secondary | ICD-10-CM | POA: Insufficient documentation

## 2019-09-02 ENCOUNTER — Encounter: Payer: Self-pay | Admitting: Podiatry

## 2019-09-02 DIAGNOSIS — J45998 Other asthma: Secondary | ICD-10-CM | POA: Diagnosis not present

## 2019-09-02 DIAGNOSIS — R0602 Shortness of breath: Secondary | ICD-10-CM | POA: Diagnosis not present

## 2019-09-02 NOTE — Progress Notes (Signed)
Subjective:  Patient ID: Heather Grant, female    DOB: 12-08-1940,  MRN: [id number] HPI Chief Complaint  Patient presents with  . Foot Pain    Between 1st and 2nd toes left - numbness, feeling of something caught between the toes x few months  . Debridement    Requesting nail care  . New Patient (Initial Visit)    78 y.o. female presents with the above complaint.   ROS: Denies fever chills nausea vomiting muscle aches pains calf pain back pain chest pain shortness of breath.  Does relate a history of back pain left.  Past Medical History:  Diagnosis Date  . Arthritis    hands and knees. May be hips  . Gout    great toe, ankles  . Hyperlipidemia   . Hypertension   . Migraines   . Nocturnal dyspnea    ONO 2010 with several readings  below 88%  . Obesity, Class II, BMI 35-39.9   . OSA (obstructive sleep apnea)    mild by study Dec '09. AHI 5.7  . Plantar fasciitis   . Varicella    Past Surgical History:  Procedure Laterality Date  . CARPAL TUNNEL RELEASE     right  . cataract     right eye with IOL  . COLONOSCOPY WITH PROPOFOL N/A 04/26/2018   Procedure: COLONOSCOPY WITH PROPOFOL;  Surgeon: Heather Stabler, MD;  Location: [facility];  Service: Gastroenterology;  Laterality: N/A;  . DILATION AND CURETTAGE OF UTERUS    . G5P4     NSVD    Current Outpatient Medications:  .  albuterol (PROAIR HFA) 108 (90 Base) MCG/ACT inhaler, Inhale 1-2 puffs into the lungs every 6 (six) hours as needed for wheezing or shortness of breath., Disp: 8.5 g, Rfl: 3 .  Aspirin-Salicylamide-Caffeine (BC HEADACHE POWDER PO), Take 1 packet by mouth daily as needed (knee pain)., Disp: , Rfl:  .  colchicine 0.6 MG tablet, TAKE 1 TABLET BY MOUTH DAILY, FOR GOUT FLARE., Disp: 90 tablet, Rfl: 0 .  famotidine (PEPCID) 20 MG tablet, TAKE 1 TABLET BY MOUTH EVERYDAY AT BEDTIME, Disp: 90 tablet, Rfl: 3 .  gabapentin (NEURONTIN) 100 MG capsule, Take 1 capsule (100 mg total) by mouth 2 (two)  times daily as needed., Disp: 60 capsule, Rfl: 3 .  hydrALAZINE (APRESOLINE) 25 MG tablet, Take 1 tablet (25 mg total) by mouth 3 (three) times daily., Disp: 270 tablet, Rfl: 3 .  lidocaine (LIDODERM) 5 %, Place 1 patch onto the skin daily as needed (leg pain). Remove & Discard patch within 12 hours or as directed by MD, Disp: , Rfl:  .  metoprolol tartrate (LOPRESSOR) 25 MG tablet, Take 1 tablet (25 mg total) by mouth 2 (two) times daily., Disp: 180 tablet, Rfl: 3 .  mometasone-formoterol (DULERA) 100-5 MCG/ACT AERO, Inhale 2 puffs into the lungs 2 (two) times daily., Disp: 13 g, Rfl: 11 .  nystatin cream (MYCOSTATIN), Apply 1 application topically daily as needed for itching., Disp: , Rfl:  .  OXYGEN, Inhale 2 L into the lungs at bedtime., Disp: , Rfl:  .  pantoprazole (PROTONIX) 40 MG tablet, TAKE 1 TABLET (40 MG TOTAL) BY MOUTH DAILY. TAKE 30-60 MIN BEFORE FIRST MEAL OF THE DAY, Disp: 90 tablet, Rfl: 3 .  Polyvinyl Alcohol (LUBRICANT DROPS OP), Place 1 drop into both eyes daily., Disp: , Rfl:  .  rosuvastatin (CRESTOR) 10 MG tablet, Take 1 tablet (10 mg total) by mouth daily., Disp: 90 tablet,  Rfl: 1 .  VOLTAREN 1 % GEL, Apply 2 g topically daily as needed (knee pain). , Disp: , Rfl:   No Known Allergies Review of Systems Objective:   Vitals:   08/31/19 1322  BP: (!) 153/74  Pulse: (!) 56  Resp: 16    General: Well developed, nourished, in no acute distress, alert and oriented x3   Dermatological: Skin is warm, dry and supple bilateral. Nails x 10 are well maintained; remaining integument appears unremarkable at this time. There are no open sores, no preulcerative lesions, no rash or signs of infection present.  Toenails are long thick dystrophic clinically mycotic.  Vascular: Dorsalis Pedis artery and Posterior Tibial artery pedal pulses are 2/4 bilateral with immedate capillary fill time. Pedal hair growth present. No varicosities and no lower extremity edema present bilateral.    Neruologic: Grossly intact via light touch bilateral. Vibratory intact via tuning fork bilateral. Protective threshold with Semmes Wienstein monofilament intact to all pedal sites bilateral. Patellar and Achilles deep tendon reflexes 2+ bilateral. No Babinski or clonus noted bilateral.  Neuritis at the level of the TMT first intermetatarsal space deep peroneal nerve left foot.  Musculoskeletal: No gross boney pedal deformities bilateral. No pain, crepitus, or limitation noted with foot and ankle range of motion bilateral. Muscular strength 5/5 in all groups tested bilateral.  Gait: Unassisted, Nonantalgic.    Radiographs:  Radiographs taken today demonstrate no significant acute abnormalities mild to moderate osteopenia.  Assessment & Plan:   Assessment: Currently deep peroneal nerve neuritis and pain in limb secondary to onychomycosis.  Plan: Injected left foot today 10 mg of Kenalog 5 mg of Marcaine point of maximal tenderness proximalmost aspect of the first intermetatarsal space left.  Also debrided toenails 1 through 5 bilateral.  She will follow-up with Dr. Elisha Grant for routine debridement.     Heather Grant, [place]

## 2019-10-03 DIAGNOSIS — J45998 Other asthma: Secondary | ICD-10-CM | POA: Diagnosis not present

## 2019-10-03 DIAGNOSIS — R0602 Shortness of breath: Secondary | ICD-10-CM | POA: Diagnosis not present

## 2019-10-12 ENCOUNTER — Ambulatory Visit: Payer: Medicare Other

## 2019-10-13 ENCOUNTER — Ambulatory Visit (INDEPENDENT_AMBULATORY_CARE_PROVIDER_SITE_OTHER): Payer: Medicare Other | Admitting: *Deleted

## 2019-10-13 ENCOUNTER — Other Ambulatory Visit: Payer: Self-pay

## 2019-10-13 ENCOUNTER — Ambulatory Visit: Payer: Medicare Other

## 2019-10-13 VITALS — BP 138/82 | HR 62 | Resp 17 | Ht <= 58 in | Wt 208.0 lb

## 2019-10-13 DIAGNOSIS — Z0000 Encounter for general adult medical examination without abnormal findings: Secondary | ICD-10-CM
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
## 2019-10-13 NOTE — Patient Instructions (Addendum)
Continue doing brain stimulating activities (puzzles, reading, adult coloring books, staying active) to keep memory sharp.   Continue to eat heart healthy diet (full of fruits, vegetables, whole grains, lean protein, water--limit salt, fat, and sugar intake) and increase physical activity as tolerated.   Ms. Heather Grant , Thank you for taking time to come for your Medicare Wellness Visit. I appreciate your ongoing commitment to your health goals. Please review the following plan we discussed and let me know if I can assist you in the future.   These are the goals we discussed: Goals    . Patient Stated     Stay as independent as possible.       This is a list of the screening recommended for you and due dates:  Health Maintenance  Topic Date Due  . Flu Shot  02/29/2020*  . DEXA scan (bone density measurement)  08/02/2020*  . Tetanus Vaccine  08/23/2022  . Pneumonia vaccines  Completed  *Topic was postponed. The date shown is not the original due date.    Preventive Care 33 Years and Older, Female Preventive care refers to lifestyle choices and visits with your health care provider that can promote health and wellness. This includes:  A yearly physical exam. This is also called an annual well check.  Regular dental and eye exams.  Immunizations.  Screening for certain conditions.  Healthy lifestyle choices, such as diet and exercise. What can I expect for my preventive care visit? Physical exam Your health care provider will check:  Height and weight. These may be used to calculate body mass index (BMI), which is a measurement that tells if you are at a healthy weight.  Heart rate and blood pressure.  Your skin for abnormal spots. Counseling Your health care provider may ask you questions about:  Alcohol, tobacco, and drug use.  Emotional well-being.  Home and relationship well-being.  Sexual activity.  Eating habits.  History of falls.  Memory and ability  to understand (cognition).  Work and work Statistician.  Pregnancy and menstrual history. What immunizations do I need?  Influenza (flu) vaccine  This is recommended every year. Tetanus, diphtheria, and pertussis (Tdap) vaccine  You may need a Td booster every 10 years. Varicella (chickenpox) vaccine  You may need this vaccine if you have not already been vaccinated. Zoster (shingles) vaccine  You may need this after age 70. Pneumococcal conjugate (PCV13) vaccine  One dose is recommended after age 53. Pneumococcal polysaccharide (PPSV23) vaccine  One dose is recommended after age 76. Measles, mumps, and rubella (MMR) vaccine  You may need at least one dose of MMR if you were born in 1957 or later. You may also need a second dose. Meningococcal conjugate (MenACWY) vaccine  You may need this if you have certain conditions. Hepatitis A vaccine  You may need this if you have certain conditions or if you travel or work in places where you may be exposed to hepatitis A. Hepatitis B vaccine  You may need this if you have certain conditions or if you travel or work in places where you may be exposed to hepatitis B. Haemophilus influenzae type b (Hib) vaccine  You may need this if you have certain conditions. You may receive vaccines as individual doses or as more than one vaccine together in one shot (combination vaccines). Talk with your health care provider about the risks and benefits of combination vaccines. What tests do I need? Blood tests  Lipid and cholesterol levels. These  may be checked every 5 years, or more frequently depending on your overall health.  Hepatitis C test.  Hepatitis B test. Screening  Lung cancer screening. You may have this screening every year starting at age 3 if you have a 30-pack-year history of smoking and currently smoke or have quit within the past 15 years.  Colorectal cancer screening. All adults should have this screening starting at  age 34 and continuing until age 49. Your health care provider may recommend screening at age 51 if you are at increased risk. You will have tests every 1-10 years, depending on your results and the type of screening test.  Diabetes screening. This is done by checking your blood sugar (glucose) after you have not eaten for a while (fasting). You may have this done every 1-3 years.  Mammogram. This may be done every 1-2 years. Talk with your health care provider about how often you should have regular mammograms.  BRCA-related cancer screening. This may be done if you have a family history of breast, ovarian, tubal, or peritoneal cancers. Other tests  Sexually transmitted disease (STD) testing.  Bone density scan. This is done to screen for osteoporosis. You may have this done starting at age 38. Follow these instructions at home: Eating and drinking  Eat a diet that includes fresh fruits and vegetables, whole grains, lean protein, and low-fat dairy products. Limit your intake of foods with high amounts of sugar, saturated fats, and salt.  Take vitamin and mineral supplements as recommended by your health care provider.  Do not drink alcohol if your health care provider tells you not to drink.  If you drink alcohol: ? Limit how much you have to 0-1 drink a day. ? Be aware of how much alcohol is in your drink. In the [place] one drink equals one 12 oz bottle of beer (355 mL), one 5 oz glass of Rondal Vandevelde (148 mL), or one 1 oz glass of hard liquor (44 mL). Lifestyle  Take daily care of your teeth and gums.  Stay active. Exercise for at least 30 minutes on 5 or more days each week.  Do not use any products that contain nicotine or tobacco, such as cigarettes, e-cigarettes, and chewing tobacco. If you need help quitting, ask your health care provider.  If you are sexually active, practice safe sex. Use a condom or other form of protection in order to prevent STIs (sexually transmitted  infections).  Talk with your health care provider about taking a low-dose aspirin or statin. What's next?  Go to your health care provider once a year for a well check visit.  Ask your health care provider how often you should have your eyes and teeth checked.  Stay up to date on all vaccines. This information is not intended to replace advice given to you by your health care provider. Make sure you discuss any questions you have with your health care provider. Document Released: 12/13/2015 Document Revised: 11/10/2018 Document Reviewed: 11/10/2018 Elsevier Patient Education  2020 [employer].

## 2019-10-18 ENCOUNTER — Telehealth: Payer: Self-pay | Admitting: *Deleted

## 2019-10-18 DIAGNOSIS — Z9181 History of falling: Secondary | ICD-10-CM

## 2019-10-18 NOTE — Telephone Encounter (Signed)
Called patient in response to a follow-up from her AWV on 10/13/19. Nurse inquired about the cost for patient to receive a rolling walker from [facility]. Heather Grant stated that it looks as though this will not be an expense out of pocket for the patient based upon her insurance. Patient shared she is unable to walk no more than 20 feet and is having an difficult time standing long enough to cook her meals. Patient stated for nurse to order the rolling walker and an order was placed.

## 2019-10-19 ENCOUNTER — Telehealth: Payer: Self-pay | Admitting: Internal Medicine

## 2019-10-19 NOTE — Telephone Encounter (Signed)
Copied from [place] [id number]. Topic: General - Other >> Oct 19, 2019 10:40 AM Leward Quan A wrote: Reason for CRM: Patient called for Emelia Loron stated that she was supposed to call her today. She can be reached at Ph#  [phone]

## 2019-10-20 DIAGNOSIS — I872 Venous insufficiency (chronic) (peripheral): Secondary | ICD-10-CM | POA: Diagnosis not present

## 2019-10-20 DIAGNOSIS — J4520 Mild intermittent asthma, uncomplicated: Secondary | ICD-10-CM | POA: Diagnosis not present

## 2019-10-20 DIAGNOSIS — M722 Plantar fascial fibromatosis: Secondary | ICD-10-CM | POA: Diagnosis not present

## 2019-10-20 DIAGNOSIS — M1990 Unspecified osteoarthritis, unspecified site: Secondary | ICD-10-CM | POA: Diagnosis not present

## 2019-11-02 DIAGNOSIS — J45998 Other asthma: Secondary | ICD-10-CM | POA: Diagnosis not present

## 2019-11-02 DIAGNOSIS — R0602 Shortness of breath: Secondary | ICD-10-CM | POA: Diagnosis not present

## 2019-11-03 DIAGNOSIS — M170 Bilateral primary osteoarthritis of knee: Secondary | ICD-10-CM | POA: Diagnosis not present

## 2019-11-09 ENCOUNTER — Telehealth: Payer: Self-pay | Admitting: *Deleted

## 2019-11-09 ENCOUNTER — Telehealth: Payer: Self-pay

## 2019-11-09 NOTE — Telephone Encounter (Signed)
Sent message to [employer]

## 2019-11-09 NOTE — Telephone Encounter (Signed)
Last note describes need. I am not sure how to do PA for DME, perhaps the company can send [place] a form?

## 2019-11-09 NOTE — Telephone Encounter (Signed)
Duplicative, will done.

## 2019-11-09 NOTE — Telephone Encounter (Signed)
Nurse called in response to VM patient left for her to call patient back. Patient states she got in touch with her insurance company about receiving assistance for a lift chair. Per patient, she would need a prior authorization sent to her insurance company describing why she needs the lift chair. Ms. Lager last PCP visit was 08/03/19 and it was discussed that PCP may need to see her before doing an authorization. Patient verbalized understanding.

## 2019-11-09 NOTE — Telephone Encounter (Signed)
Copied from [place] [id number]. Topic: General - Inquiry >> Nov 09, 2019  9:33 AM Virl Axe D wrote: Reason for CRM: Pt is requesting an order for a lift chair to be sent to a DME company as she has a hard time getting up. Please advise. Requesting callback from [place]. Please advise.

## 2019-11-17 ENCOUNTER — Ambulatory Visit: Payer: Self-pay | Admitting: Obstetrics & Gynecology

## 2019-11-20 ENCOUNTER — Other Ambulatory Visit: Payer: Self-pay

## 2019-11-20 ENCOUNTER — Ambulatory Visit: Payer: Self-pay | Admitting: Obstetrics & Gynecology

## 2019-11-20 NOTE — Patient Outreach (Signed)
[facility]) Care Management  11/20/2019  Heather Grant 08-Jul-1941 [id number]   Medication Adherence call to Heather Grant Telephone call to Patient regarding Medication Adherence unable to reach patient. Patient has pick up from [place] on 11/18/19. Heather Grant is showing due on Rosuvastatin 10 mg under [facility].   [facility] Management Direct Dial [phone]  Fax [phone] [email or web address]

## 2019-12-03 DIAGNOSIS — R0602 Shortness of breath: Secondary | ICD-10-CM | POA: Diagnosis not present

## 2019-12-03 DIAGNOSIS — J45998 Other asthma: Secondary | ICD-10-CM | POA: Diagnosis not present

## 2019-12-06 ENCOUNTER — Other Ambulatory Visit: Payer: Self-pay

## 2019-12-06 ENCOUNTER — Ambulatory Visit (INDEPENDENT_AMBULATORY_CARE_PROVIDER_SITE_OTHER): Payer: Medicare Other | Admitting: Podiatry

## 2019-12-06 DIAGNOSIS — M79676 Pain in unspecified toe(s): Secondary | ICD-10-CM | POA: Diagnosis not present

## 2019-12-06 DIAGNOSIS — B351 Tinea unguium: Secondary | ICD-10-CM

## 2019-12-06 NOTE — Patient Instructions (Signed)

## 2019-12-07 DIAGNOSIS — M170 Bilateral primary osteoarthritis of knee: Secondary | ICD-10-CM | POA: Diagnosis not present

## 2019-12-10 ENCOUNTER — Encounter: Payer: Self-pay | Admitting: Podiatry

## 2019-12-10 NOTE — Progress Notes (Signed)
Subjective:  Heather Grant presents to clinic today with cc of  painful, thick, discolored, elongated toenails  of both feet that become tender and patient cannot cut because of thickness. Pain is aggravated when wearing enclosed shoe gear and relieved with periodic professional debridement.  Patient voices no new pedal concerns on today's visit.  Medications reviewed in chart.  No Known Allergies   Objective:  Physical Examination:  Vascular Examination: Capillary refill time immediate b/l.  Palpable DP/PT pulses b/l.  Digital hair absent b/l.   No edema noted b/l.  Skin temperature gradient WNL b/l.  Dermatological Examination: Skin with normal turgor, texture and tone b/l.  No open wounds b/l.  No interdigital macerations noted b/l.  Elongated, thick, discolored brittle toenails with subungual debris and pain on dorsal palpation of nailbeds 1-5 b/l.  Musculoskeletal Examination: Muscle strength 5/5 to all muscle groups b/l.  No pain, crepitus or joint discomfort with active/passive ROM.  Neurological Examination: Sensation intact 5/5 b/l with 10 gram monofilament.  Vibratory sensation intact b/l.  Proprioceptive sensation intact b/l.  Assessment: Mycotic nail infection with pain 1-5 b/l  Plan: 1. Toenails 1-5 b/l were debrided in length and girth without iatrogenic laceration. 2.  Continue soft, supportive shoe gear daily. 3.  Report any pedal injuries to medical professional. 4.  Follow up 3 months. 5.  Patient/POA to call should there be a question/concern in there interim.

## 2019-12-14 DIAGNOSIS — M170 Bilateral primary osteoarthritis of knee: Secondary | ICD-10-CM | POA: Diagnosis not present

## 2019-12-17 ENCOUNTER — Other Ambulatory Visit: Payer: Self-pay | Admitting: Internal Medicine

## 2019-12-17 DIAGNOSIS — E785 Hyperlipidemia, unspecified: Secondary | ICD-10-CM

## 2019-12-21 DIAGNOSIS — M170 Bilateral primary osteoarthritis of knee: Secondary | ICD-10-CM | POA: Diagnosis not present

## 2019-12-26 ENCOUNTER — Ambulatory Visit: Payer: Medicare Other | Admitting: Internal Medicine

## 2020-01-03 DIAGNOSIS — J45998 Other asthma: Secondary | ICD-10-CM | POA: Diagnosis not present

## 2020-01-03 DIAGNOSIS — R0602 Shortness of breath: Secondary | ICD-10-CM | POA: Diagnosis not present

## 2020-01-04 DIAGNOSIS — M179 Osteoarthritis of knee, unspecified: Secondary | ICD-10-CM | POA: Diagnosis not present

## 2020-01-04 DIAGNOSIS — I10 Essential (primary) hypertension: Secondary | ICD-10-CM | POA: Diagnosis not present

## 2020-01-04 DIAGNOSIS — Z1239 Encounter for other screening for malignant neoplasm of breast: Secondary | ICD-10-CM | POA: Diagnosis not present

## 2020-01-04 DIAGNOSIS — Z1231 Encounter for screening mammogram for malignant neoplasm of breast: Secondary | ICD-10-CM | POA: Diagnosis not present

## 2020-01-12 ENCOUNTER — Other Ambulatory Visit: Payer: Self-pay | Admitting: Internal Medicine

## 2020-01-17 ENCOUNTER — Encounter: Payer: Self-pay | Admitting: Internal Medicine

## 2020-01-17 ENCOUNTER — Other Ambulatory Visit: Payer: Self-pay

## 2020-01-17 ENCOUNTER — Ambulatory Visit (INDEPENDENT_AMBULATORY_CARE_PROVIDER_SITE_OTHER): Payer: Medicare Other | Admitting: Internal Medicine

## 2020-01-17 DIAGNOSIS — I872 Venous insufficiency (chronic) (peripheral): Secondary | ICD-10-CM

## 2020-01-17 NOTE — Patient Instructions (Signed)
I would work on trying to reduce salt in your diet.   Drink more water to help with the swelling as well.   Chronic Venous Insufficiency Chronic venous insufficiency is a condition where the leg veins cannot effectively pump blood from the legs to the heart. This happens when the vein walls are either stretched, weakened, or damaged, or when the valves inside the vein are damaged. With the right treatment, you should be able to continue with an active life. This condition is also called venous stasis. What are the causes? Common causes of this condition include:  High blood pressure inside the veins (venous hypertension).  Sitting or standing too long, causing increased blood pressure in the leg veins.  A blood clot that blocks blood flow in a vein (deep vein thrombosis, DVT).  Inflammation of a vein (phlebitis) that causes a blood clot to form.  Tumors in the pelvis that cause blood to back up. What increases the risk? The following factors may make you more likely to develop this condition:  Having a family history of this condition.  Obesity.  Pregnancy.  Living without enough regular physical activity or exercise (sedentary lifestyle).  Smoking.  Having a job that requires long periods of standing or sitting in one place.  Being a certain age. Women in their 11s and 18s and men in their 39s are more likely to develop this condition. What are the signs or symptoms? Symptoms of this condition include:  Veins that are enlarged, bulging, or twisted (varicose veins).  Skin breakdown or ulcers.  Reddened skin or dark discoloration of skin on the leg between the knee and ankle.  Brown, smooth, tight, and painful skin just above the ankle, usually on the inside of the leg (lipodermatosclerosis).  Swelling of the legs. How is this diagnosed? This condition may be diagnosed based on:  Your medical history.  A physical exam.  Tests, such as: ? A procedure that creates  an image of a blood vessel and nearby organs and provides information about blood flow through the blood vessel (duplex ultrasound). ? A procedure that tests blood flow (plethysmography). ? A procedure that looks at the veins using X-ray and dye (venogram). How is this treated? The goals of treatment are to help you return to an active life and to minimize pain or disability. Treatment depends on the severity of your condition, and it may include:  Wearing compression stockings. These can help relieve symptoms and help prevent your condition from getting worse. However, they do not cure the condition.  Sclerotherapy. This procedure involves an injection of a solution that shrinks damaged veins.  Surgery. This may involve: ? Removing a diseased vein (vein stripping). ? Cutting off blood flow through the vein (laser ablation surgery). ? Repairing or reconstructing a valve within the affected vein. Follow these instructions at home:      Wear compression stockings as told by your health care provider. These stockings help to prevent blood clots and reduce swelling in your legs.  Take over-the-counter and prescription medicines only as told by your health care provider.  Stay active by exercising, walking, or doing different activities. Ask your health care provider what activities are safe for you and how much exercise you need.  Drink enough fluid to keep your urine pale yellow.  Do not use any products that contain nicotine or tobacco, such as cigarettes, e-cigarettes, and chewing tobacco. If you need help quitting, ask your health care provider.  Keep all follow-up  visits as told by your health care provider. This is important. Contact a health care provider if you:  Have redness, swelling, or more pain in the affected area.  See a red streak or line that goes up or down from the affected area.  Have skin breakdown or skin loss in the affected area, even if the breakdown is  small.  Get an injury in the affected area. Get help right away if:  You get an injury and an open wound in the affected area.  You have: ? Severe pain that does not get better with medicine. ? Sudden numbness or weakness in the foot or ankle below the affected area. ? Trouble moving your foot or ankle. ? A fever. ? Worse or persistent symptoms. ? Chest pain. ? Shortness of breath. Summary  Chronic venous insufficiency is a condition where the leg veins cannot effectively pump blood from the legs to the heart.  Chronic venous insufficiency occurs when the vein walls become stretched, weakened, or damaged, or when valves within the vein are damaged.  Treatment depends on how severe your condition is. It often involves wearing compression stockings and may involve having a procedure.  Make sure you stay active by exercising, walking, or doing different activities. Ask your health care provider what activities are safe for you and how much exercise you need. This information is not intended to replace advice given to you by your health care provider. Make sure you discuss any questions you have with your health care provider. Document Revised: 08/09/2018 Document Reviewed: 08/09/2018 Elsevier Patient Education  [place].

## 2020-01-17 NOTE — Progress Notes (Signed)
   Subjective:   Patient ID: Heather Grant, female    DOB: 1941/03/21, 79 y.o.   MRN: [id number]  HPI The patient is a 79 YO female coming in for ongoing bilateral foot and ankle swelling. This has been present for years. Overall a little worse in the last couple of weeks. Not very bad today. Difficult for her to elevate her legs due to furniture. She does have compression stockings which she uses sometimes. Not drinking as much water lately and probably more salt. No exercise lately.   Review of Systems  Constitutional: Negative.   HENT: Negative.   Eyes: Negative.   Respiratory: Negative for cough, chest tightness and shortness of breath.   Cardiovascular: Positive for leg swelling. Negative for chest pain and palpitations.  Gastrointestinal: Negative for abdominal distention, abdominal pain, constipation, diarrhea, nausea and vomiting.  Musculoskeletal: Negative.   Skin: Negative.   Neurological: Negative.   Psychiatric/Behavioral: Negative.     Objective:  Physical Exam Constitutional:      Appearance: She is well-developed.  HENT:     Head: Normocephalic and atraumatic.  Cardiovascular:     Rate and Rhythm: Normal rate and regular rhythm.  Pulmonary:     Effort: Pulmonary effort is normal. No respiratory distress.     Breath sounds: Normal breath sounds. No wheezing or rales.  Abdominal:     General: Bowel sounds are normal. There is no distension.     Palpations: Abdomen is soft.     Tenderness: There is no abdominal tenderness. There is no rebound.  Musculoskeletal:     Cervical back: Normal range of motion.     Comments: Slight edema at the ankles, non-pitting. No calf swelling, tenderness bilaterally.   Skin:    General: Skin is warm and dry.  Neurological:     Mental Status: She is alert and oriented to person, place, and time.     Coordination: Coordination normal.     Vitals:   01/17/20 1022  BP: 138/84  Pulse: (!) 57  Temp: 98.8 F (37.1 C)    TempSrc: Oral  SpO2: 98%  Weight: 204 lb (92.5 kg)  Height: 4\' 9"  (1.448 m)    This visit occurred during the SARS-CoV-2 public health emergency.  Safety protocols were in place, including screening questions prior to the visit, additional usage of staff PPE, and extensive cleaning of exam room while observing appropriate contact time as indicated for disinfecting solutions.   Assessment & Plan:

## 2020-01-18 NOTE — Assessment & Plan Note (Signed)
Chronic and slightly worse lately. Advised low sodium diet and increase water intake to help. Use compression stockings and elevate feet when able. No medications or repeat testing needed today.

## 2020-01-31 DIAGNOSIS — R0602 Shortness of breath: Secondary | ICD-10-CM | POA: Diagnosis not present

## 2020-01-31 DIAGNOSIS — J45998 Other asthma: Secondary | ICD-10-CM | POA: Diagnosis not present

## 2020-02-04 ENCOUNTER — Ambulatory Visit: Payer: Medicare Other

## 2020-02-04 ENCOUNTER — Ambulatory Visit: Payer: Medicare Other | Attending: Internal Medicine

## 2020-02-04 DIAGNOSIS — Z23 Encounter for immunization: Secondary | ICD-10-CM | POA: Insufficient documentation

## 2020-02-04 NOTE — Progress Notes (Signed)
   Covid-19 Vaccination Clinic  Name:  QUIERRA WOODIS    MRN: [id number] DOB: 07-13-1941  02/04/2020  Ms. Edith Endave was observed post Covid-19 immunization for 15 minutes without incident. She was provided with Vaccine Information Sheet and instruction to access the V-Safe system.   Ms. [place] was instructed to call 911 with any severe reactions post vaccine: Marland Kitchen Difficulty breathing  . Swelling of face and throat  . A fast heartbeat  . A bad rash all over body  . Dizziness and weakness   Immunizations Administered    Name Date Dose VIS Date Route   Pfizer COVID-19 Vaccine 02/04/2020  1:13 PM 0.3 mL 11/10/2019 Intramuscular   Manufacturer: [place]   Lot: [id number]   [place]: [id number]

## 2020-02-23 ENCOUNTER — Other Ambulatory Visit: Payer: Self-pay | Admitting: Internal Medicine

## 2020-02-23 DIAGNOSIS — J45901 Unspecified asthma with (acute) exacerbation: Secondary | ICD-10-CM

## 2020-03-05 ENCOUNTER — Ambulatory Visit: Payer: Medicare Other | Admitting: Podiatry

## 2020-03-05 DIAGNOSIS — H40023 Open angle with borderline findings, high risk, bilateral: Secondary | ICD-10-CM | POA: Diagnosis not present

## 2020-03-06 ENCOUNTER — Ambulatory Visit: Payer: Medicare Other | Attending: Internal Medicine

## 2020-03-06 DIAGNOSIS — Z23 Encounter for immunization: Secondary | ICD-10-CM

## 2020-03-06 NOTE — Progress Notes (Signed)
   Covid-19 Vaccination Clinic  Name:  Heather Grant    MRN: [id number] DOB: [birth year]  03/06/2020  [name] was observed post Covid-19 immunization for 15 minutes without incident. She was provided with Vaccine Information Sheet and instruction to access the V-Safe system.   [name] was instructed to call 911 with any severe reactions post vaccine: Marland Kitchen Difficulty breathing  . Swelling of face and throat  . A fast heartbeat  . A bad rash all over body  . Dizziness and weakness   Immunizations Administered    Name Date Dose VIS Date Route   Pfizer COVID-19 Vaccine 03/06/2020  9:11 AM 0.3 mL 11/10/2019 Intramuscular   Manufacturer: [employer]   Lot: [id number]   [place]: [id number]

## 2020-03-19 ENCOUNTER — Encounter: Payer: Self-pay | Admitting: Internal Medicine

## 2020-03-19 ENCOUNTER — Ambulatory Visit (INDEPENDENT_AMBULATORY_CARE_PROVIDER_SITE_OTHER): Payer: Medicare Other | Admitting: Internal Medicine

## 2020-03-19 ENCOUNTER — Other Ambulatory Visit: Payer: Self-pay

## 2020-03-19 VITALS — BP 162/70 | HR 56 | Temp 99.1°F | Ht 59.0 in | Wt 210.2 lb

## 2020-03-19 DIAGNOSIS — M5442 Lumbago with sciatica, left side: Secondary | ICD-10-CM | POA: Insufficient documentation

## 2020-03-19 MED ORDER — METHYLPREDNISOLONE ACETATE 40 MG/ML IJ SUSP
40.0000 mg | Freq: Once | INTRAMUSCULAR | Status: AC
  Administered 2020-03-19: 40 mg via INTRAMUSCULAR

## 2020-03-19 MED ORDER — KETOROLAC TROMETHAMINE 30 MG/ML IJ SOLN
30.0000 mg | Freq: Once | INTRAMUSCULAR | Status: AC
  Administered 2020-03-19: 30 mg via INTRAMUSCULAR

## 2020-03-19 NOTE — Patient Instructions (Addendum)
We have given you a shot today to help the back feel better. It can take a day or two to work fully.   It is okay to keep taking the meloxicam.   You can also try the gabapentin because that might help the back and pain going down into the legs more.

## 2020-03-19 NOTE — Progress Notes (Signed)
   Subjective:   Patient ID: Heather Grant, female    DOB: Oct 30, 1941, 79 y.o.   MRN: [id number]  HPI The patient is a 79 YO female coming in for concerns about new back pain. Started about 2-3 days ago. Woke up with it. Denies injury or overuse. Has severe arthritis in the knees bilateral and sees orthopedics and they have done many injections in the knees. They are recommending surgery but she does not want to have that at this time. She has pain in the low back and going down the left leg. Denies numbness or weakness. Has been taking meloxicam which has helped some. Overall not improving and 6-7/10.  Review of Systems  Constitutional: Positive for activity change.  HENT: Negative.   Eyes: Negative.   Respiratory: Negative for cough, chest tightness and shortness of breath.   Cardiovascular: Negative for chest pain, palpitations and leg swelling.  Gastrointestinal: Negative for abdominal distention, abdominal pain, constipation, diarrhea, nausea and vomiting.  Musculoskeletal: Positive for arthralgias, back pain, gait problem and myalgias.  Skin: Negative.   Psychiatric/Behavioral: Negative.     Objective:  Physical Exam Constitutional:      Appearance: She is well-developed.  HENT:     Head: Normocephalic and atraumatic.  Cardiovascular:     Rate and Rhythm: Normal rate and regular rhythm.  Pulmonary:     Effort: Pulmonary effort is normal. No respiratory distress.     Breath sounds: Normal breath sounds. No wheezing or rales.  Abdominal:     General: Bowel sounds are normal. There is no distension.     Palpations: Abdomen is soft.     Tenderness: There is no abdominal tenderness. There is no rebound.  Musculoskeletal:        General: Tenderness present.     Cervical back: Normal range of motion.     Comments: Pain in the knees and low back bilaterally  Skin:    General: Skin is warm and dry.  Neurological:     Mental Status: She is alert and oriented to person,  place, and time.     Coordination: Coordination abnormal.     Comments: Walker for ambulation     Vitals:   03/19/20 1155  BP: (!) 162/70  Pulse: (!) 56  Temp: 99.1 F (37.3 C)  SpO2: 99%  Weight: 210 lb 3.2 oz (95.3 kg)  Height: 4\' 11"  (1.499 m)    This visit occurred during the SARS-CoV-2 public health emergency.  Safety protocols were in place, including screening questions prior to the visit, additional usage of staff PPE, and extensive cleaning of exam room while observing appropriate contact time as indicated for disinfecting solutions.   Assessment & Plan:  Toradol 30 mg IM and depo-medrol 40 mg IM given at visit

## 2020-03-19 NOTE — Assessment & Plan Note (Signed)
Given toradol 30 mg IM and depo-medrol 40 mg IM at visit. Can continue meloxicam. Advised to take gabapentin which she has at home and is not taking lately to help with the sciatica pain. No red flag signs to suggest need for imaging today.

## 2020-03-26 ENCOUNTER — Telehealth: Payer: Self-pay | Admitting: Internal Medicine

## 2020-03-26 NOTE — Telephone Encounter (Signed)
New message:    Pt is calling and states the shots that was given last week at her appt aren't helping. She would like to know what else can she do. Please advise.

## 2020-03-26 NOTE — Telephone Encounter (Signed)
Patient said that she hasn't been taking her meloxicam which doctor had informed at last office visit to take to help with sciatic pain.    Patient will take meloxicam starting today and will call office in a few days if no better

## 2020-05-21 ENCOUNTER — Other Ambulatory Visit: Payer: Self-pay

## 2020-05-21 ENCOUNTER — Ambulatory Visit (INDEPENDENT_AMBULATORY_CARE_PROVIDER_SITE_OTHER): Payer: Medicare Other | Admitting: Podiatry

## 2020-05-21 ENCOUNTER — Encounter: Payer: Self-pay | Admitting: Podiatry

## 2020-05-21 DIAGNOSIS — M79676 Pain in unspecified toe(s): Secondary | ICD-10-CM

## 2020-05-21 DIAGNOSIS — B351 Tinea unguium: Secondary | ICD-10-CM

## 2020-05-26 NOTE — Progress Notes (Signed)
Subjective: Heather Grant is a 79 y.o. female patient seen today painful mycotic nails b/l that are difficult to trim. Pain interferes with ambulation. Aggravating factors include wearing enclosed shoe gear. Pain is relieved with periodic professional debridement.  Past Medical History:  Diagnosis Date  . Arthritis    hands and knees. May be hips  . Gout    great toe, ankles  . Hyperlipidemia   . Hypertension   . Migraines   . Nocturnal dyspnea    ONO 2010 with several readings  below 88%  . Obesity, Class II, BMI 35-39.9   . OSA (obstructive sleep apnea)    mild by study Dec '09. AHI 5.7  . Plantar fasciitis   . Varicella     Patient Active Problem List   Diagnosis Date Noted  . Acute bilateral low back pain with left-sided sciatica 03/19/2020  . Headache 08/31/2019  . Osteopenia 08/31/2019  . Plantar fasciitis 08/31/2019  . Hyperlipidemia LDL goal <100 08/10/2019  . Chronic venous insufficiency 12/04/2016  . Morbid (severe) obesity due to excess calories ([place]) 06/19/2016  . GERD (gastroesophageal reflux disease) 06/19/2016  . Chronic rhinitis 03/15/2014  . Mild intermittent asthma without complication 28/31/5176  . Routine general medical examination at a health care facility 07/08/2011  . Osteoarthritis 07/08/2011  . Hypertension   . Migraines   . OSA (obstructive sleep apnea)   . Gout   . Hyperlipidemia     Current Outpatient Medications on File Prior to Visit  Medication Sig Dispense Refill  . albuterol (VENTOLIN HFA) 108 (90 Base) MCG/ACT inhaler INHALE 1-2 PUFFS INTO THE LUNGS EVERY 6 (SIX) HOURS AS NEEDED FOR WHEEZING OR SHORTNESS OF BREATH. 18 g 3  . Aspirin-Salicylamide-Caffeine (BC HEADACHE POWDER PO) Take 1 packet by mouth daily as needed (knee pain).    . Cholecalciferol 50 MCG (2000 UT) CAPS Vitamin D3 50 mcg (2,000 unit) capsule  Take 1 capsule every day by oral route as directed.    . colchicine 0.6 MG tablet TAKE 1 TABLET BY MOUTH DAILY, FOR  GOUT FLARE. 90 tablet 0  . famotidine (PEPCID) 20 MG tablet TAKE 1 TABLET BY MOUTH EVERYDAY AT BEDTIME 90 tablet 3  . gabapentin (NEURONTIN) 100 MG capsule Take 1 capsule (100 mg total) by mouth 2 (two) times daily as needed. 60 capsule 3  . guaiFENesin-codeine (ROBITUSSIN AC) 100-10 MG/5ML syrup Iophen C-NR 10 mg-100 mg/5 mL oral liquid  TAKE 2 TEASPOONSFUL BY MOUTH EVERY 4 HOURS AS NEEDED FOR COUGH    . hydrALAZINE (APRESOLINE) 25 MG tablet Take 1 tablet (25 mg total) by mouth 3 (three) times daily. 270 tablet 3  . lidocaine (LIDODERM) 5 % Place 1 patch onto the skin daily as needed (leg pain). Remove & Discard patch within 12 hours or as directed by MD    . meloxicam (MOBIC) 15 MG tablet TAKE 1 TABLET BY MOUTH EVERY DAY 30 tablet 0  . metoprolol tartrate (LOPRESSOR) 25 MG tablet Take 1 tablet (25 mg total) by mouth 2 (two) times daily. 180 tablet 3  . mometasone-formoterol (DULERA) 100-5 MCG/ACT AERO Inhale 2 puffs into the lungs 2 (two) times daily. 13 g 11  . nystatin cream (MYCOSTATIN) Apply 1 application topically daily as needed for itching.    . OXYGEN Inhale 2 L into the lungs at bedtime.    . pantoprazole (PROTONIX) 40 MG tablet TAKE 1 TABLET (40 MG TOTAL) BY MOUTH DAILY. TAKE 30-60 MIN BEFORE FIRST MEAL OF THE DAY 90  tablet 3  . Polyvinyl Alcohol (LUBRICANT DROPS OP) Place 1 drop into both eyes daily.    . rosuvastatin (CRESTOR) 10 MG tablet Take 1 tablet (10 mg total) by mouth daily. 90 tablet 1  . VOLTAREN 1 % GEL Apply 2 g topically daily as needed (knee pain).      No current facility-administered medications on file prior to visit.    No Known Allergies  Objective: Physical Exam  General: Patient is a pleasant 79 y.o. African American female in NAD. AAO x 3.   Neurovascular Examination: Palpable pedal pulses b/l LE. Pedal hair absent. Lower extremity skin temperature gradient within normal limits.   Protective sensation intact 5/5 intact bilaterally with 10g  monofilament b/l. Vibratory sensation intact b/l.  Dermatological:  Pedal skin with normal turgor, texture and tone bilaterally. No open wounds bilaterally. No interdigital macerations bilaterally. Toenails 1-5 b/l elongated, discolored, dystrophic, thickened, crumbly with subungual debris and tenderness to dorsal palpation.  Musculoskeletal:  Normal muscle strength 5/5 to all lower extremity muscle groups bilaterally. No pain crepitus or joint limitation noted with ROM b/l. No gross bony deformities bilaterally.  Assessment and Plan:  1. Pain due to onychomycosis of toenail    -Examined patient. -Toenails 1-5 b/l were debrided in length and girth with sterile nail nippers and dremel without iatrogenic bleeding.  -Patient to report any pedal injuries to medical professional immediately. -Patient to continue soft, supportive shoe gear daily. -Patient/POA to call should there be question/concern in the interim.  Return in about 3 months (around 08/21/2020) for nail trim.  Marzetta Board, DPM

## 2020-06-28 ENCOUNTER — Other Ambulatory Visit: Payer: Self-pay | Admitting: Internal Medicine

## 2020-07-02 DIAGNOSIS — M170 Bilateral primary osteoarthritis of knee: Secondary | ICD-10-CM | POA: Diagnosis not present

## 2020-07-03 DIAGNOSIS — H401131 Primary open-angle glaucoma, bilateral, mild stage: Secondary | ICD-10-CM | POA: Diagnosis not present

## 2020-08-08 ENCOUNTER — Other Ambulatory Visit: Payer: Self-pay | Admitting: Internal Medicine

## 2020-08-08 DIAGNOSIS — H401131 Primary open-angle glaucoma, bilateral, mild stage: Secondary | ICD-10-CM | POA: Diagnosis not present

## 2020-08-15 DIAGNOSIS — M170 Bilateral primary osteoarthritis of knee: Secondary | ICD-10-CM | POA: Diagnosis not present

## 2020-08-22 DIAGNOSIS — M1712 Unilateral primary osteoarthritis, left knee: Secondary | ICD-10-CM | POA: Diagnosis not present

## 2020-08-22 DIAGNOSIS — M170 Bilateral primary osteoarthritis of knee: Secondary | ICD-10-CM | POA: Diagnosis not present

## 2020-08-23 ENCOUNTER — Ambulatory Visit (INDEPENDENT_AMBULATORY_CARE_PROVIDER_SITE_OTHER): Payer: Medicare Other | Admitting: Podiatry

## 2020-08-23 ENCOUNTER — Other Ambulatory Visit: Payer: Self-pay

## 2020-08-23 ENCOUNTER — Encounter: Payer: Self-pay | Admitting: Podiatry

## 2020-08-23 DIAGNOSIS — B351 Tinea unguium: Secondary | ICD-10-CM

## 2020-08-23 DIAGNOSIS — M79676 Pain in unspecified toe(s): Secondary | ICD-10-CM | POA: Diagnosis not present

## 2020-08-27 NOTE — Progress Notes (Signed)
Subjective: Heather Grant is a 79 y.o. female patient seen today painful mycotic nails b/l that are difficult to trim. Pain interferes with ambulation. Aggravating factors include wearing enclosed shoe gear. Pain is relieved with periodic professional debridement.   Her PCP is Dr. Pricilla Holm. Last visit was 05/21/2020.  She voices no new pedal problems on today's visit.  Past Medical History:  Diagnosis Date  . Arthritis    hands and knees. May be hips  . Gout    great toe, ankles  . Hyperlipidemia   . Hypertension   . Migraines   . Nocturnal dyspnea    ONO 2010 with several readings  below 88%  . Obesity, Class II, BMI 35-39.9   . OSA (obstructive sleep apnea)    mild by study Dec '09. AHI 5.7  . Plantar fasciitis   . Varicella     Patient Active Problem List   Diagnosis Date Noted  . Acute bilateral low back pain with left-sided sciatica 03/19/2020  . Headache 08/31/2019  . Osteopenia 08/31/2019  . Plantar fasciitis 08/31/2019  . Hyperlipidemia LDL goal <100 08/10/2019  . Chronic venous insufficiency 12/04/2016  . Morbid (severe) obesity due to excess calories ([place]) 06/19/2016  . GERD (gastroesophageal reflux disease) 06/19/2016  . Chronic rhinitis 03/15/2014  . Mild intermittent asthma without complication 05/25/9484  . Routine general medical examination at a health care facility 07/08/2011  . Osteoarthritis 07/08/2011  . Hypertension   . Migraines   . OSA (obstructive sleep apnea)   . Gout   . Hyperlipidemia     Current Outpatient Medications on File Prior to Visit  Medication Sig Dispense Refill  . albuterol (VENTOLIN HFA) 108 (90 Base) MCG/ACT inhaler INHALE 1-2 PUFFS INTO THE LUNGS EVERY 6 (SIX) HOURS AS NEEDED FOR WHEEZING OR SHORTNESS OF BREATH. 18 g 3  . Aspirin-Salicylamide-Caffeine (BC HEADACHE POWDER PO) Take 1 packet by mouth daily as needed (knee pain).    . Cholecalciferol 50 MCG (2000 UT) CAPS Vitamin D3 50 mcg (2,000 unit) capsule   Take 1 capsule every day by oral route as directed.    . colchicine 0.6 MG tablet TAKE 1 TABLET BY MOUTH DAILY, FOR GOUT FLARE. 90 tablet 1  . famotidine (PEPCID) 20 MG tablet TAKE 1 TABLET BY MOUTH EVERYDAY AT BEDTIME 90 tablet 3  . gabapentin (NEURONTIN) 100 MG capsule Take 1 capsule (100 mg total) by mouth 2 (two) times daily as needed. 60 capsule 3  . guaiFENesin-codeine (ROBITUSSIN AC) 100-10 MG/5ML syrup Iophen C-NR 10 mg-100 mg/5 mL oral liquid  TAKE 2 TEASPOONSFUL BY MOUTH EVERY 4 HOURS AS NEEDED FOR COUGH    . hydrALAZINE (APRESOLINE) 25 MG tablet Take 1 tablet (25 mg total) by mouth 3 (three) times daily. 270 tablet 3  . lidocaine (LIDODERM) 5 % Place 1 patch onto the skin daily as needed (leg pain). Remove & Discard patch within 12 hours or as directed by MD    . meloxicam (MOBIC) 15 MG tablet TAKE 1 TABLET BY MOUTH EVERY DAY 30 tablet 0  . metoprolol tartrate (LOPRESSOR) 25 MG tablet Take 1 tablet (25 mg total) by mouth 2 (two) times daily. APPOINTMENT NEEDED FOR ADDITIONAL REFILLS 180 tablet 0  . mometasone-formoterol (DULERA) 100-5 MCG/ACT AERO Inhale 2 puffs into the lungs 2 (two) times daily. 13 g 11  . nystatin cream (MYCOSTATIN) Apply 1 application topically daily as needed for itching.    . OXYGEN Inhale 2 L into the lungs at bedtime.    Marland Kitchen  pantoprazole (PROTONIX) 40 MG tablet TAKE 1 TABLET (40 MG TOTAL) BY MOUTH DAILY. TAKE 30-60 MIN BEFORE FIRST MEAL OF THE DAY 90 tablet 3  . Polyvinyl Alcohol (LUBRICANT DROPS OP) Place 1 drop into both eyes daily.    . rosuvastatin (CRESTOR) 10 MG tablet Take 1 tablet (10 mg total) by mouth daily. 90 tablet 1  . VOLTAREN 1 % GEL Apply 2 g topically daily as needed (knee pain).      No current facility-administered medications on file prior to visit.    No Known Allergies  Objective: Physical Exam  General: Patient is a pleasant 79 y.o. African American female in NAD. AAO x 3.   Neurovascular Examination: Palpable pedal pulses b/l LE.  Pedal hair absent. Lower extremity skin temperature gradient within normal limits.   Protective sensation intact 5/5 intact bilaterally with 10g monofilament b/l. Vibratory sensation intact b/l.  Dermatological:  Pedal skin with normal turgor, texture and tone bilaterally. No open wounds bilaterally. No interdigital macerations bilaterally. Toenails 1-5 b/l elongated, discolored, dystrophic, thickened, crumbly with subungual debris and tenderness to dorsal palpation.  Musculoskeletal:  Normal muscle strength 5/5 to all lower extremity muscle groups bilaterally. No pain crepitus or joint limitation noted with ROM b/l. No gross bony deformities bilaterally.  Assessment and Plan:  1. Pain due to onychomycosis of toenail    -Examined patient. -Toenails 1-5 b/l were debrided in length and girth with sterile nail nippers and dremel without iatrogenic bleeding.  -Patient to report any pedal injuries to medical professional immediately. -Patient to continue soft, supportive shoe gear daily. -Patient/POA to call should there be question/concern in the interim.  Return in about 3 months (around 11/22/2020).  Marzetta Board, DPM

## 2020-08-29 DIAGNOSIS — H1132 Conjunctival hemorrhage, left eye: Secondary | ICD-10-CM | POA: Diagnosis not present

## 2020-08-29 DIAGNOSIS — M170 Bilateral primary osteoarthritis of knee: Secondary | ICD-10-CM | POA: Diagnosis not present

## 2020-09-01 DIAGNOSIS — J45998 Other asthma: Secondary | ICD-10-CM | POA: Diagnosis not present

## 2020-09-01 DIAGNOSIS — R0602 Shortness of breath: Secondary | ICD-10-CM | POA: Diagnosis not present

## 2020-09-24 ENCOUNTER — Other Ambulatory Visit: Payer: Self-pay | Admitting: Internal Medicine

## 2020-09-27 DIAGNOSIS — Z20822 Contact with and (suspected) exposure to covid-19: Secondary | ICD-10-CM | POA: Diagnosis not present

## 2020-09-28 ENCOUNTER — Ambulatory Visit: Payer: Medicare Other

## 2020-09-30 ENCOUNTER — Telehealth: Payer: Self-pay | Admitting: Internal Medicine

## 2020-09-30 NOTE — Telephone Encounter (Signed)
Patient called and said that she tested positive for Covid 19 on 10.30.2021 and wanted to notify Dr. Sharlet Salina. Patient was wanting a call back [phone].

## 2020-09-30 NOTE — Telephone Encounter (Signed)
Called pt, LVM.   

## 2020-10-01 NOTE — Telephone Encounter (Signed)
Patient calling back and stating they never got a voicemail and would like to speak with someone.  [phone]

## 2020-10-01 NOTE — Telephone Encounter (Signed)
Pt has been informed to quarantine and monitor symptoms. If she gets worse to give [place] a call.

## 2020-10-02 DIAGNOSIS — J45998 Other asthma: Secondary | ICD-10-CM | POA: Diagnosis not present

## 2020-10-02 DIAGNOSIS — R0602 Shortness of breath: Secondary | ICD-10-CM | POA: Diagnosis not present

## 2020-10-04 ENCOUNTER — Other Ambulatory Visit: Payer: Self-pay | Admitting: Internal Medicine

## 2020-10-05 ENCOUNTER — Other Ambulatory Visit: Payer: Self-pay | Admitting: Internal Medicine

## 2020-11-01 DIAGNOSIS — J45998 Other asthma: Secondary | ICD-10-CM | POA: Diagnosis not present

## 2020-11-01 DIAGNOSIS — R0602 Shortness of breath: Secondary | ICD-10-CM | POA: Diagnosis not present

## 2020-11-07 ENCOUNTER — Other Ambulatory Visit: Payer: Self-pay | Admitting: Internal Medicine

## 2020-11-27 ENCOUNTER — Ambulatory Visit: Payer: Medicare Other | Admitting: Podiatry

## 2020-12-02 DIAGNOSIS — J45998 Other asthma: Secondary | ICD-10-CM | POA: Diagnosis not present

## 2020-12-02 DIAGNOSIS — R0602 Shortness of breath: Secondary | ICD-10-CM | POA: Diagnosis not present

## 2020-12-25 ENCOUNTER — Other Ambulatory Visit: Payer: Self-pay | Admitting: Internal Medicine

## 2021-01-02 DIAGNOSIS — J45998 Other asthma: Secondary | ICD-10-CM | POA: Diagnosis not present

## 2021-01-02 DIAGNOSIS — R0602 Shortness of breath: Secondary | ICD-10-CM | POA: Diagnosis not present

## 2021-01-06 DIAGNOSIS — M179 Osteoarthritis of knee, unspecified: Secondary | ICD-10-CM | POA: Diagnosis not present

## 2021-01-06 DIAGNOSIS — M8589 Other specified disorders of bone density and structure, multiple sites: Secondary | ICD-10-CM | POA: Diagnosis not present

## 2021-01-06 DIAGNOSIS — E559 Vitamin D deficiency, unspecified: Secondary | ICD-10-CM | POA: Diagnosis not present

## 2021-01-06 DIAGNOSIS — Z1231 Encounter for screening mammogram for malignant neoplasm of breast: Secondary | ICD-10-CM | POA: Diagnosis not present

## 2021-01-06 DIAGNOSIS — L290 Pruritus ani: Secondary | ICD-10-CM | POA: Diagnosis not present

## 2021-01-06 DIAGNOSIS — M8580 Other specified disorders of bone density and structure, unspecified site: Secondary | ICD-10-CM | POA: Diagnosis not present

## 2021-01-14 ENCOUNTER — Other Ambulatory Visit: Payer: Self-pay

## 2021-01-15 ENCOUNTER — Encounter: Payer: Self-pay | Admitting: Internal Medicine

## 2021-01-15 ENCOUNTER — Ambulatory Visit (INDEPENDENT_AMBULATORY_CARE_PROVIDER_SITE_OTHER): Payer: Medicare Other | Admitting: Internal Medicine

## 2021-01-15 VITALS — BP 134/74 | HR 58 | Temp 98.8°F | Resp 18 | Ht 59.0 in | Wt 187.2 lb

## 2021-01-15 DIAGNOSIS — Z0000 Encounter for general adult medical examination without abnormal findings: Secondary | ICD-10-CM

## 2021-01-15 DIAGNOSIS — E782 Mixed hyperlipidemia: Secondary | ICD-10-CM

## 2021-01-15 DIAGNOSIS — I10 Essential (primary) hypertension: Secondary | ICD-10-CM | POA: Diagnosis not present

## 2021-01-15 DIAGNOSIS — J4520 Mild intermittent asthma, uncomplicated: Secondary | ICD-10-CM | POA: Diagnosis not present

## 2021-01-15 DIAGNOSIS — E785 Hyperlipidemia, unspecified: Secondary | ICD-10-CM | POA: Diagnosis not present

## 2021-01-15 LAB — LIPID PANEL
Cholesterol: 214 mg/dL — ABNORMAL HIGH (ref 0–200)
HDL: 45.1 mg/dL (ref >39.00)
LDL Cholesterol: 146 mg/dL — ABNORMAL HIGH (ref 0–99)
NonHDL: 168.56
Total CHOL/HDL Ratio: 5
Triglycerides: 115 mg/dL (ref 0.0–149.0)
VLDL: 23 mg/dL (ref 0.0–40.0)

## 2021-01-15 LAB — COMPREHENSIVE METABOLIC PANEL
ALT: 9 U/L (ref 0–35)
AST: 12 U/L (ref 0–37)
Albumin: 4 g/dL (ref 3.5–5.2)
Alkaline Phosphatase: 63 U/L (ref 39–117)
BUN: 14 mg/dL (ref 6–23)
CO2: 30 mEq/L (ref 19–32)
Calcium: 10.3 mg/dL (ref 8.4–10.5)
Chloride: 106 mEq/L (ref 96–112)
Creatinine, Ser: 1.23 mg/dL — ABNORMAL HIGH (ref 0.40–1.20)
GFR: 42.26 mL/min — ABNORMAL LOW (ref >60.00)
Glucose, Bld: 88 mg/dL (ref 70–99)
Potassium: 4.7 mEq/L (ref 3.5–5.1)
Sodium: 143 mEq/L (ref 135–145)
Total Bilirubin: 0.5 mg/dL (ref 0.2–1.2)
Total Protein: 6.4 g/dL (ref 6.0–8.3)

## 2021-01-15 LAB — CBC
HCT: 39.5 % (ref 36.0–46.0)
Hemoglobin: 13.1 g/dL (ref 12.0–15.0)
MCHC: 33.3 g/dL (ref 30.0–36.0)
MCV: 94.2 fl (ref 78.0–100.0)
Platelets: 310 10*3/uL (ref 150.0–400.0)
RBC: 4.19 Mil/uL (ref 3.87–5.11)
RDW: 13.1 % (ref 11.5–15.5)
WBC: 5.7 10*3/uL (ref 4.0–10.5)

## 2021-01-15 LAB — HEMOGLOBIN A1C: Hgb A1c MFr Bld: 4.9 % (ref 4.6–6.5)

## 2021-01-15 NOTE — Progress Notes (Signed)
Subjective:   Patient ID: Heather Grant, female    DOB: Apr 24, 1941, 80 y.o.   MRN: [id number]  HPI Here for medicare wellness and physical, no new complaints. Please see A/P for status and treatment of chronic medical problems.   Diet: heart healthy Physical activity: sedentary Depression/mood screen: negative Hearing: intact to whispered voice, mild loss bilaterally Visual acuity: grossly normal with some redness, performs annual eye exam  ADLs: capable Fall risk: none Home safety: good Cognitive evaluation: intact to orientation, naming, recall and repetition EOL planning: adv directives discussed  [place] Visit from 01/15/2021 in [place] at [facility]  PHQ-2 Total Score 0      [place] from 10/10/2018 in [place]  PHQ-9 Total Score 2      I have personally reviewed and have noted 1. The patient's medical and social history - reviewed today no changes 2. Their use of alcohol, tobacco or illicit drugs 3. Their current medications and supplements 4. The patient's functional ability including ADL's, fall risks, home safety risks and hearing or visual impairment. 5. Diet and physical activities 6. Evidence for depression or mood disorders 7. Care team reviewed and updated 8.  The patient is not on an opioid pain medication.  Patient Care Team: Hoyt Koch, MD as PCP - General (Internal Medicine) Ena Dawley, MD (Obstetrics and Gynecology) Daryll Brod, MD (Orthopedic Surgery) Monna Fam, MD (Ophthalmology) Marily Memos, MD (Orthopedic Surgery) Hoyt Koch, MD as Consulting Physician (Internal Medicine) Past Medical History:  Diagnosis Date  . Arthritis    hands and knees. May be hips  . Gout    great toe, ankles  . Hyperlipidemia   . Hypertension   . Migraines   . Nocturnal dyspnea    ONO 2010 with several readings  below 88%  . Obesity, Class II,  BMI 35-39.9   . OSA (obstructive sleep apnea)    mild by study Dec '09. AHI 5.7  . Plantar fasciitis   . Varicella    Past Surgical History:  Procedure Laterality Date  . CARPAL TUNNEL RELEASE     right  . cataract     right eye with IOL  . COLONOSCOPY WITH PROPOFOL N/A 04/26/2018   Procedure: COLONOSCOPY WITH PROPOFOL;  Surgeon: Doran Stabler, MD;  Location: [facility];  Service: Gastroenterology;  Laterality: N/A;  . DILATION AND CURETTAGE OF UTERUS    . G5P4     NSVD   Family History  Problem Relation Age of Onset  . Hypertension Mother   . Stroke Father   . Lung cancer Sister        smoked  . Asthma Son      Review of Systems  Constitutional: Negative.   HENT: Negative.   Eyes: Negative.   Respiratory: Negative for cough, chest tightness and shortness of breath.   Cardiovascular: Negative for chest pain, palpitations and leg swelling.  Gastrointestinal: Negative for abdominal distention, abdominal pain, constipation, diarrhea, nausea and vomiting.  Musculoskeletal: Negative.   Skin: Negative.   Neurological: Negative.   Psychiatric/Behavioral: Negative.     Objective:  Physical Exam Constitutional:      Appearance: She is well-developed and well-nourished. She is obese.  HENT:     Head: Normocephalic and atraumatic.  Eyes:     Extraocular Movements: EOM normal.  Cardiovascular:     Rate and Rhythm: Normal rate and regular rhythm.  Pulmonary:     Effort:  Pulmonary effort is normal. No respiratory distress.     Breath sounds: Normal breath sounds. No wheezing or rales.  Abdominal:     General: Bowel sounds are normal. There is no distension.     Palpations: Abdomen is soft.     Tenderness: There is no abdominal tenderness. There is no rebound.  Musculoskeletal:        General: No edema.     Cervical back: Normal range of motion.  Skin:    General: Skin is warm and dry.  Neurological:     Mental Status: She is alert and oriented to person, place,  and time.     Coordination: Coordination normal.  Psychiatric:        Mood and Affect: Mood and affect normal.     Vitals:   01/15/21 0945  BP: 134/74  Pulse: (!) 58  Resp: 18  Temp: 98.8 F (37.1 C)  TempSrc: Oral  SpO2: 98%  Weight: 187 lb 3.2 oz (84.9 kg)  Height: 4\' 11"  (1.499 m)    This visit occurred during the SARS-CoV-2 public health emergency.  Safety protocols were in place, including screening questions prior to the visit, additional usage of staff PPE, and extensive cleaning of exam room while observing appropriate contact time as indicated for disinfecting solutions.   Assessment & Plan:

## 2021-01-15 NOTE — Patient Instructions (Addendum)
You can try prevagen for the memory to try over the counter.   Go to vaccines.gov to find the covid-19 booster shot.   Health Maintenance, Female Adopting a healthy lifestyle and getting preventive care are important in promoting health and wellness. Ask your health care provider about:  The right schedule for you to have regular tests and exams.  Things you can do on your own to prevent diseases and keep yourself healthy. What should I know about diet, weight, and exercise? Eat a healthy diet  Eat a diet that includes plenty of vegetables, fruits, low-fat dairy products, and lean protein.  Do not eat a lot of foods that are high in solid fats, added sugars, or sodium.   Maintain a healthy weight Body mass index (BMI) is used to identify weight problems. It estimates body fat based on height and weight. Your health care provider can help determine your BMI and help you achieve or maintain a healthy weight. Get regular exercise Get regular exercise. This is one of the most important things you can do for your health. Most adults should:  Exercise for at least 150 minutes each week. The exercise should increase your heart rate and make you sweat (moderate-intensity exercise).  Do strengthening exercises at least twice a week. This is in addition to the moderate-intensity exercise.  Spend less time sitting. Even light physical activity can be beneficial. Watch cholesterol and blood lipids Have your blood tested for lipids and cholesterol at 78 years of age, then have this test every 5 years. Have your cholesterol levels checked more often if:  Your lipid or cholesterol levels are high.  You are older than 78 years of age.  You are at high risk for heart disease. What should I know about cancer screening? Depending on your health history and family history, you may need to have cancer screening at various ages. This may include screening for:  Breast cancer.  Cervical  cancer.  Colorectal cancer.  Skin cancer.  Lung cancer. What should I know about heart disease, diabetes, and high blood pressure? Blood pressure and heart disease  High blood pressure causes heart disease and increases the risk of stroke. This is more likely to develop in people who have high blood pressure readings, are of African descent, or are overweight.  Have your blood pressure checked: ? Every 3-5 years if you are 71-88 years of age. ? Every year if you are 33 years old or older. Diabetes Have regular diabetes screenings. This checks your fasting blood sugar level. Have the screening done:  Once every three years after age 46 if you are at a normal weight and have a low risk for diabetes.  More often and at a younger age if you are overweight or have a high risk for diabetes. What should I know about preventing infection? Hepatitis B If you have a higher risk for hepatitis B, you should be screened for this virus. Talk with your health care provider to find out if you are at risk for hepatitis B infection. Hepatitis C Testing is recommended for:  Everyone born from [birth year] through 1963.  Anyone with known risk factors for hepatitis C. Sexually transmitted infections (STIs)  Get screened for STIs, including gonorrhea and chlamydia, if: ? You are sexually active and are younger than 78 years of age. ? You are older than 78 years of age and your health care provider tells you that you are at risk for this type of infection. ?  Your sexual activity has changed since you were last screened, and you are at increased risk for chlamydia or gonorrhea. Ask your health care provider if you are at risk.  Ask your health care provider about whether you are at high risk for HIV. Your health care provider may recommend a prescription medicine to help prevent HIV infection. If you choose to take medicine to prevent HIV, you should first get tested for HIV. You should then be tested every 3  months for as long as you are taking the medicine. Pregnancy  If you are about to stop having your period (premenopausal) and you may become pregnant, seek counseling before you get pregnant.  Take 400 to 800 micrograms (mcg) of folic acid every day if you become pregnant.  Ask for birth control (contraception) if you want to prevent pregnancy. Osteoporosis and menopause Osteoporosis is a disease in which the bones lose minerals and strength with aging. This can result in bone fractures. If you are 33 years old or older, or if you are at risk for osteoporosis and fractures, ask your health care provider if you should:  Be screened for bone loss.  Take a calcium or vitamin D supplement to lower your risk of fractures.  Be given hormone replacement therapy (HRT) to treat symptoms of menopause. Follow these instructions at home: Lifestyle  Do not use any products that contain nicotine or tobacco, such as cigarettes, e-cigarettes, and chewing tobacco. If you need help quitting, ask your health care provider.  Do not use street drugs.  Do not share needles.  Ask your health care provider for help if you need support or information about quitting drugs. Alcohol use  Do not drink alcohol if: ? Your health care provider tells you not to drink. ? You are pregnant, may be pregnant, or are planning to become pregnant.  If you drink alcohol: ? Limit how much you use to 0-1 drink a day. ? Limit intake if you are breastfeeding.  Be aware of how much alcohol is in your drink. In the [place] one drink equals one 12 oz bottle of beer (355 mL), one 5 oz glass of wine (148 mL), or one 1 oz glass of hard liquor (44 mL). General instructions  Schedule regular health, dental, and eye exams.  Stay current with your vaccines.  Tell your health care provider if: ? You often feel depressed. ? You have ever been abused or do not feel safe at home. Summary  Adopting a healthy lifestyle and getting  preventive care are important in promoting health and wellness.  Follow your health care provider's instructions about healthy diet, exercising, and getting tested or screened for diseases.  Follow your health care provider's instructions on monitoring your cholesterol and blood pressure. This information is not intended to replace advice given to you by your health care provider. Make sure you discuss any questions you have with your health care provider. Document Revised: 11/09/2018 Document Reviewed: 11/09/2018 Elsevier Patient Education  2021 [employer].

## 2021-01-17 NOTE — Assessment & Plan Note (Signed)
BP at goal on hydralazine and metoprolol. Checking CMP and adjust as needed.

## 2021-01-17 NOTE — Assessment & Plan Note (Signed)
Using dulera and albuterol prn. No flare today.

## 2021-01-17 NOTE — Assessment & Plan Note (Signed)
Flu shot declines. Covid-19 2 shots booster strongly encouraged. Pneumonia complete. Shingrix counseled. Tetanus due 2023. Colonoscopy aged out future. Mammogram due 2023, pap smear aged out and dexa due 2027. Counseled about sun safety and mole surveillance. Counseled about the dangers of distracted driving. Given 10 year screening recommendations.

## 2021-01-17 NOTE — Assessment & Plan Note (Signed)
Talked to her about diet and exercise to help. Complicated by gerd, htn, hyperlipidemia.

## 2021-01-17 NOTE — Assessment & Plan Note (Signed)
It is unclear if she is taking statin as no recent refills. Checking lipid panel and adjust crestor as needed.

## 2021-01-24 ENCOUNTER — Other Ambulatory Visit: Payer: Self-pay | Admitting: Internal Medicine

## 2021-01-24 DIAGNOSIS — E785 Hyperlipidemia, unspecified: Secondary | ICD-10-CM

## 2021-01-24 MED ORDER — ROSUVASTATIN CALCIUM 20 MG PO TABS: 20.0000 mg | ORAL_TABLET | Freq: Every day | ORAL | 3 refills | Status: DC

## 2021-01-25 ENCOUNTER — Other Ambulatory Visit: Payer: Self-pay | Admitting: Internal Medicine

## 2021-01-30 DIAGNOSIS — R0602 Shortness of breath: Secondary | ICD-10-CM | POA: Diagnosis not present

## 2021-01-30 DIAGNOSIS — J45998 Other asthma: Secondary | ICD-10-CM | POA: Diagnosis not present

## 2021-02-02 ENCOUNTER — Other Ambulatory Visit: Payer: Self-pay | Admitting: Internal Medicine

## 2021-02-02 DIAGNOSIS — J45901 Unspecified asthma with (acute) exacerbation: Secondary | ICD-10-CM

## 2021-02-21 DIAGNOSIS — M170 Bilateral primary osteoarthritis of knee: Secondary | ICD-10-CM | POA: Diagnosis not present

## 2021-03-01 ENCOUNTER — Other Ambulatory Visit: Payer: Self-pay | Admitting: Internal Medicine

## 2021-03-02 DIAGNOSIS — J45998 Other asthma: Secondary | ICD-10-CM | POA: Diagnosis not present

## 2021-03-02 DIAGNOSIS — R0602 Shortness of breath: Secondary | ICD-10-CM | POA: Diagnosis not present

## 2021-04-01 DIAGNOSIS — J45998 Other asthma: Secondary | ICD-10-CM | POA: Diagnosis not present

## 2021-04-01 DIAGNOSIS — R0602 Shortness of breath: Secondary | ICD-10-CM | POA: Diagnosis not present

## 2021-04-09 ENCOUNTER — Other Ambulatory Visit: Payer: Self-pay | Admitting: Internal Medicine

## 2021-04-10 DIAGNOSIS — Z961 Presence of intraocular lens: Secondary | ICD-10-CM | POA: Diagnosis not present

## 2021-04-10 DIAGNOSIS — H43813 Vitreous degeneration, bilateral: Secondary | ICD-10-CM | POA: Diagnosis not present

## 2021-04-10 DIAGNOSIS — H401123 Primary open-angle glaucoma, left eye, severe stage: Secondary | ICD-10-CM | POA: Diagnosis not present

## 2021-04-10 DIAGNOSIS — H401111 Primary open-angle glaucoma, right eye, mild stage: Secondary | ICD-10-CM | POA: Diagnosis not present

## 2021-04-22 ENCOUNTER — Other Ambulatory Visit: Payer: Self-pay

## 2021-04-23 ENCOUNTER — Encounter: Payer: Self-pay | Admitting: Internal Medicine

## 2021-04-23 ENCOUNTER — Ambulatory Visit (INDEPENDENT_AMBULATORY_CARE_PROVIDER_SITE_OTHER): Payer: Medicare Other | Admitting: Internal Medicine

## 2021-04-23 VITALS — BP 134/76 | HR 55 | Temp 98.4°F | Resp 18 | Ht 59.0 in | Wt 192.8 lb

## 2021-04-23 DIAGNOSIS — M1A0710 Idiopathic chronic gout, right ankle and foot, without tophus (tophi): Secondary | ICD-10-CM

## 2021-04-23 DIAGNOSIS — E782 Mixed hyperlipidemia: Secondary | ICD-10-CM | POA: Diagnosis not present

## 2021-04-23 LAB — LIPID PANEL
Cholesterol: 154 mg/dL (ref 0–200)
HDL: 54.1 mg/dL (ref >39.00)
LDL Cholesterol: 85 mg/dL (ref 0–99)
NonHDL: 99.84
Total CHOL/HDL Ratio: 3
Triglycerides: 74 mg/dL (ref 0.0–149.0)
VLDL: 14.8 mg/dL (ref 0.0–40.0)

## 2021-04-23 LAB — COMPREHENSIVE METABOLIC PANEL
ALT: 18 U/L (ref 0–35)
AST: 19 U/L (ref 0–37)
Albumin: 4.1 g/dL (ref 3.5–5.2)
Alkaline Phosphatase: 53 U/L (ref 39–117)
BUN: 21 mg/dL (ref 6–23)
CO2: 28 mEq/L (ref 19–32)
Calcium: 9.9 mg/dL (ref 8.4–10.5)
Chloride: 106 mEq/L (ref 96–112)
Creatinine, Ser: 1.14 mg/dL (ref 0.40–1.20)
GFR: 46.21 mL/min — ABNORMAL LOW (ref >60.00)
Glucose, Bld: 92 mg/dL (ref 70–99)
Potassium: 4 mEq/L (ref 3.5–5.1)
Sodium: 142 mEq/L (ref 135–145)
Total Bilirubin: 0.6 mg/dL (ref 0.2–1.2)
Total Protein: 6.4 g/dL (ref 6.0–8.3)

## 2021-04-23 LAB — URIC ACID: Uric Acid, Serum: 6.6 mg/dL (ref 2.4–7.0)

## 2021-04-23 NOTE — Progress Notes (Signed)
   Subjective:   Patient ID: Heather Grant, female    DOB: [birth year], 80 y.o.   MRN: [id number]  HPI The patient is a 80 YO female coming in for f/u cholesterol (was out meds and just got back on about 2-3 months ago, denies side effects, diet is less good and up about 4 pounds) and weight (has been eating more ice cream even in place of meals, weight up about 4 pounds) and gout (she is concerned due to diet changes, denies flare, not on preventative treatment).   Review of Systems  Constitutional: Negative.   HENT: Negative.   Eyes: Negative.   Respiratory: Negative for cough, chest tightness and shortness of breath.   Cardiovascular: Negative for chest pain, palpitations and leg swelling.  Gastrointestinal: Negative for abdominal distention, abdominal pain, constipation, diarrhea, nausea and vomiting.  Musculoskeletal: Positive for arthralgias.  Skin: Negative.   Neurological: Negative.   Psychiatric/Behavioral: Negative.     Objective:  Physical Exam Constitutional:      Appearance: She is well-developed. She is obese.  HENT:     Head: Normocephalic and atraumatic.  Cardiovascular:     Rate and Rhythm: Normal rate and regular rhythm.  Pulmonary:     Effort: Pulmonary effort is normal. No respiratory distress.     Breath sounds: Normal breath sounds. No wheezing or rales.  Abdominal:     General: Bowel sounds are normal. There is no distension.     Palpations: Abdomen is soft.     Tenderness: There is no abdominal tenderness. There is no rebound.  Musculoskeletal:     Cervical back: Normal range of motion.  Skin:    General: Skin is warm and dry.  Neurological:     Mental Status: She is alert and oriented to person, place, and time.     Coordination: Coordination normal.     Vitals:   04/23/21 1035  BP: 134/76  Pulse: (!) 55  Resp: 18  Temp: 98.4 F (36.9 C)  TempSrc: Oral  SpO2: 99%  Weight: 192 lb 12.8 oz (87.5 kg)  Height: 4\' 11"  (1.499 m)    This  visit occurred during the SARS-CoV-2 public health emergency.  Safety protocols were in place, including screening questions prior to the visit, additional usage of staff PPE, and extensive cleaning of exam room while observing appropriate contact time as indicated for disinfecting solutions.   Assessment & Plan:

## 2021-04-23 NOTE — Patient Instructions (Signed)
We are checking the labs today and will call you back about the results.  Cut back on the ice cream to help the weight.

## 2021-04-25 NOTE — Assessment & Plan Note (Signed)
Taking crestor 20 mg daily, previously on pravastatin and did not get to goal. Checking lipid panel and may need adjustment.

## 2021-04-25 NOTE — Assessment & Plan Note (Signed)
BMI 38 but complicated by osteoarthritis, gout, hypertension, hyperlipidemia. She is up about 4 pounds due to adding ice cream. Advised her to cut back on this significantly and that this is not a meal replacement.

## 2021-04-25 NOTE — Assessment & Plan Note (Signed)
Taking colchicine if needed for flare. No recent flare. Checking uric acid and if needed can start preventative.

## 2021-05-02 DIAGNOSIS — J45998 Other asthma: Secondary | ICD-10-CM | POA: Diagnosis not present

## 2021-05-02 DIAGNOSIS — R0602 Shortness of breath: Secondary | ICD-10-CM | POA: Diagnosis not present

## 2021-05-04 ENCOUNTER — Other Ambulatory Visit: Payer: Self-pay | Admitting: Internal Medicine

## 2021-05-16 ENCOUNTER — Other Ambulatory Visit: Payer: Self-pay | Admitting: Internal Medicine

## 2021-05-20 ENCOUNTER — Ambulatory Visit: Payer: Medicare Other | Attending: Internal Medicine

## 2021-05-20 ENCOUNTER — Other Ambulatory Visit (HOSPITAL_BASED_OUTPATIENT_CLINIC_OR_DEPARTMENT_OTHER): Payer: Self-pay

## 2021-05-20 DIAGNOSIS — Z23 Encounter for immunization: Secondary | ICD-10-CM

## 2021-05-20 MED ORDER — PFIZER-BIONT COVID-19 VAC-TRIS 30 MCG/0.3ML IM SUSP
INTRAMUSCULAR | 0 refills | Status: DC
  Filled 2021-05-20: qty 0.3, 1d supply, fill #0

## 2021-05-20 NOTE — Progress Notes (Signed)
   Covid-19 Vaccination Clinic  Name:  Heather Grant    MRN: [id number] DOB: 11/04/1941  05/20/2021  [name] was observed post Covid-19 immunization for 15 minutes without incident. She was provided with Vaccine Information Sheet and instruction to access the V-Safe system.   [name] was instructed to call 911 with any severe reactions post vaccine: Difficulty breathing  Swelling of face and throat  A fast heartbeat  A bad rash all over body  Dizziness and weakness   Immunizations Administered     Name Date Dose VIS Date Route   PFIZER Comrnaty(Gray TOP) Covid-19 Vaccine 05/20/2021  1:13 PM 0.3 mL 11/07/2020 Intramuscular   Manufacturer: [place]   Lot: [id number]   [place]: [phone]

## 2021-05-23 DIAGNOSIS — M25561 Pain in right knee: Secondary | ICD-10-CM | POA: Diagnosis not present

## 2021-05-23 DIAGNOSIS — M25562 Pain in left knee: Secondary | ICD-10-CM | POA: Diagnosis not present

## 2021-05-30 ENCOUNTER — Other Ambulatory Visit: Payer: Self-pay | Admitting: Internal Medicine

## 2021-05-30 NOTE — Telephone Encounter (Signed)
Please refill as per office routine med refill policy (all routine meds refilled for 3 mo or monthly per pt preference up to one year from last visit, then month to month grace period for 3 mo, then further med refills will have to be denied)  

## 2021-06-01 DIAGNOSIS — J45998 Other asthma: Secondary | ICD-10-CM | POA: Diagnosis not present

## 2021-06-01 DIAGNOSIS — R0602 Shortness of breath: Secondary | ICD-10-CM | POA: Diagnosis not present

## 2021-06-13 ENCOUNTER — Other Ambulatory Visit: Payer: Self-pay | Admitting: Internal Medicine

## 2021-06-17 ENCOUNTER — Other Ambulatory Visit: Payer: Self-pay | Admitting: Internal Medicine

## 2021-07-02 DIAGNOSIS — R0602 Shortness of breath: Secondary | ICD-10-CM | POA: Diagnosis not present

## 2021-07-02 DIAGNOSIS — J45998 Other asthma: Secondary | ICD-10-CM | POA: Diagnosis not present

## 2021-07-10 ENCOUNTER — Encounter: Payer: Self-pay | Admitting: Internal Medicine

## 2021-07-10 ENCOUNTER — Other Ambulatory Visit: Payer: Self-pay

## 2021-07-10 ENCOUNTER — Ambulatory Visit (INDEPENDENT_AMBULATORY_CARE_PROVIDER_SITE_OTHER): Payer: Medicare Other | Admitting: Internal Medicine

## 2021-07-10 DIAGNOSIS — R0789 Other chest pain: Secondary | ICD-10-CM

## 2021-07-10 MED ORDER — TRIAMCINOLONE ACETONIDE 0.1 % EX CREA: 1.0000 "application " | TOPICAL_CREAM | Freq: Two times a day (BID) | CUTANEOUS | 0 refills | Status: DC

## 2021-07-10 MED ORDER — SULFAMETHOXAZOLE-TRIMETHOPRIM 800-160 MG PO TABS: 1.0000 | ORAL_TABLET | Freq: Two times a day (BID) | ORAL | 0 refills | Status: DC

## 2021-07-10 NOTE — Patient Instructions (Signed)
We have sent in bactrim to take 1 pill twice a day for 5 days.  We have sent in cream to use on the spot twice a day.

## 2021-07-10 NOTE — Progress Notes (Signed)
   Subjective:   Patient ID: Heather Grant, female    DOB: [birth year], 80 y.o.   MRN: [id number]  HPI The patient is a 80 YO female coming in for lesion on her chest wall. Denies trauma to the area and no pain. Mild itching.   Review of Systems  Constitutional: Negative.   HENT: Negative.    Eyes: Negative.   Respiratory:  Negative for cough, chest tightness and shortness of breath.   Cardiovascular:  Negative for chest pain, palpitations and leg swelling.  Gastrointestinal:  Negative for abdominal distention, abdominal pain, constipation, diarrhea, nausea and vomiting.  Musculoskeletal:  Positive for arthralgias, gait problem and myalgias.  Skin: Negative.   Psychiatric/Behavioral: Negative.     Objective:  Physical Exam Constitutional:      Appearance: She is well-developed. She is obese.  HENT:     Head: Normocephalic and atraumatic.  Cardiovascular:     Rate and Rhythm: Normal rate and regular rhythm.     Comments: Small infected follicle on the midline chest wall Pulmonary:     Effort: Pulmonary effort is normal. No respiratory distress.     Breath sounds: Normal breath sounds. No wheezing or rales.  Abdominal:     General: Bowel sounds are normal. There is no distension.     Palpations: Abdomen is soft.     Tenderness: There is no abdominal tenderness. There is no rebound.  Musculoskeletal:     Cervical back: Normal range of motion.  Skin:    General: Skin is warm and dry.  Neurological:     Mental Status: She is alert and oriented to person, place, and time.     Coordination: Coordination normal.    Vitals:   07/10/21 1439  BP: 130/68  Pulse: (!) 51  Resp: 18  Temp: 98.9 F (37.2 C)  TempSrc: Oral  SpO2: 99%  Weight: 192 lb 12.8 oz (87.5 kg)  Height: '4\' 11"'$  (1.499 m)    This visit occurred during the SARS-CoV-2 public health emergency.  Safety protocols were in place, including screening questions prior to the visit, additional usage of staff PPE,  and extensive cleaning of exam room while observing appropriate contact time as indicated for disinfecting solutions.   Assessment & Plan:

## 2021-07-11 DIAGNOSIS — R0789 Other chest pain: Secondary | ICD-10-CM | POA: Insufficient documentation

## 2021-07-11 NOTE — Assessment & Plan Note (Signed)
Due to infected follicle. Rx bactrim and triamcinolone ointment as this is itching.

## 2021-07-19 ENCOUNTER — Other Ambulatory Visit: Payer: Self-pay | Admitting: Internal Medicine

## 2021-08-02 DIAGNOSIS — J45998 Other asthma: Secondary | ICD-10-CM | POA: Diagnosis not present

## 2021-08-02 DIAGNOSIS — R0602 Shortness of breath: Secondary | ICD-10-CM | POA: Diagnosis not present

## 2021-08-11 ENCOUNTER — Other Ambulatory Visit: Payer: Self-pay | Admitting: Internal Medicine

## 2021-08-19 DIAGNOSIS — H905 Unspecified sensorineural hearing loss: Secondary | ICD-10-CM | POA: Diagnosis not present

## 2021-09-01 DIAGNOSIS — R0602 Shortness of breath: Secondary | ICD-10-CM | POA: Diagnosis not present

## 2021-09-01 DIAGNOSIS — J45998 Other asthma: Secondary | ICD-10-CM | POA: Diagnosis not present

## 2021-09-03 ENCOUNTER — Encounter: Payer: Self-pay | Admitting: Podiatry

## 2021-09-03 ENCOUNTER — Other Ambulatory Visit: Payer: Self-pay

## 2021-09-03 ENCOUNTER — Ambulatory Visit (INDEPENDENT_AMBULATORY_CARE_PROVIDER_SITE_OTHER): Payer: Medicare Other | Admitting: Podiatry

## 2021-09-03 DIAGNOSIS — B351 Tinea unguium: Secondary | ICD-10-CM

## 2021-09-03 DIAGNOSIS — M79676 Pain in unspecified toe(s): Secondary | ICD-10-CM

## 2021-09-03 DIAGNOSIS — I872 Venous insufficiency (chronic) (peripheral): Secondary | ICD-10-CM

## 2021-09-03 NOTE — Progress Notes (Signed)
This patient returns to my office for at risk foot care.  This patient requires this care by a professional since this patient will be at risk due to having chronic venous insufficiency.  This patient is unable to cut nails herself since the patient cannot reach hiernails.These nails are painful walking and wearing shoes.  This patient presents for at risk foot care today.  General Appearance  Alert, conversant and in no acute stress.  Vascular  Dorsalis pedis and posterior tibial  pulses are palpable  bilaterally.  Capillary return is within normal limits  bilaterally. Temperature is within normal limits  bilaterally.  Neurologic  Senn-Weinstein monofilament wire test within normal limits  bilaterally. Muscle power within normal limits bilaterally.  Nails Thick disfigured discolored nails with subungual debris  from hallux to fifth toes bilaterally. No evidence of bacterial infection or drainage bilaterally.  Orthopedic  No limitations of motion  feet .  No crepitus or effusions noted.  HAV  B/L with hammer toes  B/L.  Skin  normotropic skin with no porokeratosis noted bilaterally.  No signs of infections or ulcers noted.     Onychomycosis  Pain in right toes  Pain in left toes  Consent was obtained for treatment procedures.   Mechanical debridement of nails 1-5  bilaterally performed with a nail nipper.  Filed with dremel without incident.    Return office visit    requests prn                  Told patient to return for periodic foot care and evaluation due to potential at risk complications.   Gardiner Barefoot DPM

## 2021-09-05 DIAGNOSIS — M25562 Pain in left knee: Secondary | ICD-10-CM | POA: Diagnosis not present

## 2021-09-05 DIAGNOSIS — M25561 Pain in right knee: Secondary | ICD-10-CM | POA: Diagnosis not present

## 2021-10-02 DIAGNOSIS — R0602 Shortness of breath: Secondary | ICD-10-CM | POA: Diagnosis not present

## 2021-10-02 DIAGNOSIS — J45998 Other asthma: Secondary | ICD-10-CM | POA: Diagnosis not present

## 2021-10-30 DIAGNOSIS — H401111 Primary open-angle glaucoma, right eye, mild stage: Secondary | ICD-10-CM | POA: Diagnosis not present

## 2021-10-30 DIAGNOSIS — H401123 Primary open-angle glaucoma, left eye, severe stage: Secondary | ICD-10-CM | POA: Diagnosis not present

## 2021-11-07 ENCOUNTER — Other Ambulatory Visit: Payer: Self-pay | Admitting: Internal Medicine

## 2021-12-02 DIAGNOSIS — J45998 Other asthma: Secondary | ICD-10-CM | POA: Diagnosis not present

## 2021-12-02 DIAGNOSIS — R0602 Shortness of breath: Secondary | ICD-10-CM | POA: Diagnosis not present

## 2021-12-10 ENCOUNTER — Other Ambulatory Visit: Payer: Self-pay | Admitting: Internal Medicine

## 2021-12-10 DIAGNOSIS — E785 Hyperlipidemia, unspecified: Secondary | ICD-10-CM

## 2021-12-12 DIAGNOSIS — R69 Illness, unspecified: Secondary | ICD-10-CM | POA: Diagnosis not present

## 2021-12-12 DIAGNOSIS — M25562 Pain in left knee: Secondary | ICD-10-CM | POA: Diagnosis not present

## 2021-12-12 DIAGNOSIS — M25561 Pain in right knee: Secondary | ICD-10-CM | POA: Diagnosis not present

## 2022-01-02 DIAGNOSIS — J45998 Other asthma: Secondary | ICD-10-CM | POA: Diagnosis not present

## 2022-01-02 DIAGNOSIS — R0602 Shortness of breath: Secondary | ICD-10-CM | POA: Diagnosis not present

## 2022-01-04 ENCOUNTER — Other Ambulatory Visit: Payer: Self-pay | Admitting: Internal Medicine

## 2022-01-14 ENCOUNTER — Other Ambulatory Visit: Payer: Self-pay | Admitting: Internal Medicine

## 2022-01-16 ENCOUNTER — Ambulatory Visit (INDEPENDENT_AMBULATORY_CARE_PROVIDER_SITE_OTHER): Payer: Medicare Other

## 2022-01-16 ENCOUNTER — Telehealth: Payer: Self-pay | Admitting: *Deleted

## 2022-01-16 ENCOUNTER — Other Ambulatory Visit: Payer: Self-pay

## 2022-01-16 DIAGNOSIS — Z0000 Encounter for general adult medical examination without abnormal findings: Secondary | ICD-10-CM | POA: Diagnosis not present

## 2022-01-16 DIAGNOSIS — Z5989 Other problems related to housing and economic circumstances: Secondary | ICD-10-CM | POA: Diagnosis not present

## 2022-01-16 NOTE — Telephone Encounter (Signed)
° °  Telephone encounter was:  Successful.  01/16/2022 Name: Heather Grant MRN: [id number] DOB: October 11, 1941  [name] is a 81 y.o. year old female who is a primary care patient of Hoyt Koch, MD . The community resource team was consulted for assistance with Patient is interested in moving  as coats and  dresses  and other items have been missing and she says she has reported it to management , will provide housing lists and also housing mediation  Care guide performed the following interventions: Patient provided with information about care guide support team and interviewed to confirm resource needs Follow up call placed to community resources to determine status of patients referral.  Follow Up Plan:  No further follow up planned at this time. The patient has been provided with needed resources. Will make sure she receives mailed information   [place], Care Management  [phone] [address] , [place] [address] Email : [name]
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
## 2022-01-16 NOTE — Patient Instructions (Signed)
Heather Grant , Thank you for taking time to come for your Medicare Wellness Visit. I appreciate your ongoing commitment to your health goals. Please review the following plan we discussed and let me know if I can assist you in the future.   Screening recommendations/referrals: Colonoscopy: Done 04/26/18  Mammogram: Done 01/06/21 repeat every year due 01/06/22 Bone Density: Done 01/06/21 repeat every 2 years  Recommended yearly ophthalmology/optometry visit for glaucoma screening and checkup Recommended yearly dental visit for hygiene and checkup  Vaccinations: Influenza vaccine: declined  Pneumococcal vaccine: Up to date Tdap vaccine: Done 08/23/12 repeat 10 years 08/23/22 due  Shingles vaccine: Shingrix discussed. Please contact your pharmacy for coverage information.    Covid-19:Completed 3/7, 03/06/20 & 05/20/21  Advanced directives: Advance directive discussed with you today. Even though you declined this today please call our office should you change your mind and we can give you the proper paperwork for you to fill out.  Conditions/risks identified: None at this time   Next appointment: Follow up in one year for your annual wellness visit    Preventive Care 65 Years and Older, Female Preventive care refers to lifestyle choices and visits with your health care provider that can promote health and wellness. What does preventive care include? A yearly physical exam. This is also called an annual well check. Dental exams once or twice a year. Routine eye exams. Ask your health care provider how often you should have your eyes checked. Personal lifestyle choices, including: Daily care of your teeth and gums. Regular physical activity. Eating a healthy diet. Avoiding tobacco and drug use. Limiting alcohol use. Practicing safe sex. Taking low-dose aspirin every day. Taking vitamin and mineral supplements as recommended by your health care provider. What happens during an annual well  check? The services and screenings done by your health care provider during your annual well check will depend on your age, overall health, lifestyle risk factors, and family history of disease. Counseling  Your health care provider may ask you questions about your: Alcohol use. Tobacco use. Drug use. Emotional well-being. Home and relationship well-being. Sexual activity. Eating habits. History of falls. Memory and ability to understand (cognition). Work and work Statistician. Reproductive health. Screening  You may have the following tests or measurements: Height, weight, and BMI. Blood pressure. Lipid and cholesterol levels. These may be checked every 5 years, or more frequently if you are over 64 years old. Skin check. Lung cancer screening. You may have this screening every year starting at age 40 if you have a 30-pack-year history of smoking and currently smoke or have quit within the past 15 years. Fecal occult blood test (FOBT) of the stool. You may have this test every year starting at age 82. Flexible sigmoidoscopy or colonoscopy. You may have a sigmoidoscopy every 5 years or a colonoscopy every 10 years starting at age 45. Hepatitis C blood test. Hepatitis B blood test. Sexually transmitted disease (STD) testing. Diabetes screening. This is done by checking your blood sugar (glucose) after you have not eaten for a while (fasting). You may have this done every 1-3 years. Bone density scan. This is done to screen for osteoporosis. You may have this done starting at age 4. Mammogram. This may be done every 1-2 years. Talk to your health care provider about how often you should have regular mammograms. Talk with your health care provider about your test results, treatment options, and if necessary, the need for more tests. Vaccines  Your health care provider  may recommend certain vaccines, such as: Influenza vaccine. This is recommended every year. Tetanus, diphtheria, and  acellular pertussis (Tdap, Td) vaccine. You may need a Td booster every 10 years. Zoster vaccine. You may need this after age 61. Pneumococcal 13-valent conjugate (PCV13) vaccine. One dose is recommended after age 33. Pneumococcal polysaccharide (PPSV23) vaccine. One dose is recommended after age 5. Talk to your health care provider about which screenings and vaccines you need and how often you need them. This information is not intended to replace advice given to you by your health care provider. Make sure you discuss any questions you have with your health care provider. Document Released: 12/13/2015 Document Revised: 08/05/2016 Document Reviewed: 09/17/2015 Elsevier Interactive Patient Education  2017 [place] Prevention in the Home Falls can cause injuries. They can happen to people of all ages. There are many things you can do to make your home safe and to help prevent falls. What can I do on the outside of my home? Regularly fix the edges of walkways and driveways and fix any cracks. Remove anything that might make you trip as you walk through a door, such as a raised step or threshold. Trim any bushes or trees on the path to your home. Use bright outdoor lighting. Clear any walking paths of anything that might make someone trip, such as rocks or tools. Regularly check to see if handrails are loose or broken. Make sure that both sides of any steps have handrails. Any raised decks and porches should have guardrails on the edges. Have any leaves, snow, or ice cleared regularly. Use sand or salt on walking paths during winter. Clean up any spills in your garage right away. This includes oil or grease spills. What can I do in the bathroom? Use night lights. Install grab bars by the toilet and in the tub and shower. Do not use towel bars as grab bars. Use non-skid mats or decals in the tub or shower. If you need to sit down in the shower, use a plastic, non-slip stool. Keep  the floor dry. Clean up any water that spills on the floor as soon as it happens. Remove soap buildup in the tub or shower regularly. Attach bath mats securely with double-sided non-slip rug tape. Do not have throw rugs and other things on the floor that can make you trip. What can I do in the bedroom? Use night lights. Make sure that you have a light by your bed that is easy to reach. Do not use any sheets or blankets that are too big for your bed. They should not hang down onto the floor. Have a firm chair that has side arms. You can use this for support while you get dressed. Do not have throw rugs and other things on the floor that can make you trip. What can I do in the kitchen? Clean up any spills right away. Avoid walking on wet floors. Keep items that you use a lot in easy-to-reach places. If you need to reach something above you, use a strong step stool that has a grab bar. Keep electrical cords out of the way. Do not use floor polish or wax that makes floors slippery. If you must use wax, use non-skid floor wax. Do not have throw rugs and other things on the floor that can make you trip. What can I do with my stairs? Do not leave any items on the stairs. Make sure that there are handrails on both sides  of the stairs and use them. Fix handrails that are broken or loose. Make sure that handrails are as long as the stairways. Check any carpeting to make sure that it is firmly attached to the stairs. Fix any carpet that is loose or worn. Avoid having throw rugs at the top or bottom of the stairs. If you do have throw rugs, attach them to the floor with carpet tape. Make sure that you have a light switch at the top of the stairs and the bottom of the stairs. If you do not have them, ask someone to add them for you. What else can I do to help prevent falls? Wear shoes that: Do not have high heels. Have rubber bottoms. Are comfortable and fit you well. Are closed at the toe. Do not  wear sandals. If you use a stepladder: Make sure that it is fully opened. Do not climb a closed stepladder. Make sure that both sides of the stepladder are locked into place. Ask someone to hold it for you, if possible. Clearly mark and make sure that you can see: Any grab bars or handrails. First and last steps. Where the edge of each step is. Use tools that help you move around (mobility aids) if they are needed. These include: Canes. Walkers. Scooters. Crutches. Turn on the lights when you go into a dark area. Replace any light bulbs as soon as they burn out. Set up your furniture so you have a clear path. Avoid moving your furniture around. If any of your floors are uneven, fix them. If there are any pets around you, be aware of where they are. Review your medicines with your doctor. Some medicines can make you feel dizzy. This can increase your chance of falling. Ask your doctor what other things that you can do to help prevent falls. This information is not intended to replace advice given to you by your health care provider. Make sure you discuss any questions you have with your health care provider. Document Released: 09/12/2009 Document Revised: 04/23/2016 Document Reviewed: 12/21/2014 Elsevier Interactive Patient Education  2017 [employer].

## 2022-01-19 ENCOUNTER — Other Ambulatory Visit: Payer: Self-pay | Admitting: Internal Medicine

## 2022-01-22 ENCOUNTER — Telehealth: Payer: Self-pay | Admitting: *Deleted

## 2022-01-22 NOTE — Telephone Encounter (Signed)
° °  Telephone encounter was:  Unsuccessful.  01/22/2022 Name: LINDAANN GRADILLA MRN: [id number] DOB: 07/04/1941  Unsuccessful outbound call made today to assist with:   housing   Outreach Attempt:  2nd Attempt  A HIPAA compliant voice message was left requesting a return call.  Instructed patient to call back at   Instructed patient to call back at [phone]  at their earliest convenience. . [place], Care Management  [phone] [address] , [place] [address] Email : [name]

## 2022-01-26 ENCOUNTER — Telehealth: Payer: Self-pay | Admitting: *Deleted

## 2022-01-26 ENCOUNTER — Telehealth: Payer: Self-pay | Admitting: Internal Medicine

## 2022-01-26 NOTE — Telephone Encounter (Signed)
Pt requesting a c/b regarding additional AWV questions

## 2022-01-26 NOTE — Telephone Encounter (Signed)
° °  Telephone encounter was:  Successful.  01/26/2022 Name: Heather Grant MRN: [id number] DOB: 1941-07-15  Heather Grant is a 81 y.o. year old female who is a primary care patient of Hoyt Koch, MD . The community resource team was consulted for assistance with  housing   Care guide performed the following interventions: Patient has received the mailed housing information .  Follow Up Plan:  No further follow up planned at this time. The patient has been provided with needed resources.  [place], Care Management  [phone] [address] , [place] [address] Email : [name]

## 2022-01-28 DIAGNOSIS — K649 Unspecified hemorrhoids: Secondary | ICD-10-CM | POA: Diagnosis not present

## 2022-01-28 DIAGNOSIS — Z1231 Encounter for screening mammogram for malignant neoplasm of breast: Secondary | ICD-10-CM | POA: Diagnosis not present

## 2022-01-28 DIAGNOSIS — I10 Essential (primary) hypertension: Secondary | ICD-10-CM | POA: Diagnosis not present

## 2022-01-28 DIAGNOSIS — M8580 Other specified disorders of bone density and structure, unspecified site: Secondary | ICD-10-CM | POA: Diagnosis not present

## 2022-01-28 DIAGNOSIS — E559 Vitamin D deficiency, unspecified: Secondary | ICD-10-CM | POA: Diagnosis not present

## 2022-01-28 DIAGNOSIS — B356 Tinea cruris: Secondary | ICD-10-CM | POA: Diagnosis not present

## 2022-01-28 LAB — HM MAMMOGRAPHY

## 2022-01-29 ENCOUNTER — Telehealth: Payer: Self-pay | Admitting: Internal Medicine

## 2022-01-29 NOTE — Telephone Encounter (Signed)
Patient calling in ? ?Patient says yesterday when she went to get her mammogram done her BP was higher than normal (does not remember reading) ? ?Says she checked it again this morning & it was 160/73 ? ?Wants to know what provider suggest or what she can do to get it down (no other symptoms) ? ?Please call [phone] ?

## 2022-01-29 NOTE — Telephone Encounter (Signed)
Would monitor 2-3 times this week (make sure resting for 15 minutes no recent caffeine prior to checking) and let us know readings. Avoid salt in diet to help. ?

## 2022-01-30 DIAGNOSIS — R0602 Shortness of breath: Secondary | ICD-10-CM | POA: Diagnosis not present

## 2022-01-30 DIAGNOSIS — J45998 Other asthma: Secondary | ICD-10-CM | POA: Diagnosis not present

## 2022-01-30 NOTE — Telephone Encounter (Signed)
Called pt. Unable to lvm due to the phone constantly ringing.  ?

## 2022-02-04 ENCOUNTER — Other Ambulatory Visit: Payer: Self-pay

## 2022-02-04 ENCOUNTER — Encounter: Payer: Self-pay | Admitting: Internal Medicine

## 2022-02-04 ENCOUNTER — Ambulatory Visit (INDEPENDENT_AMBULATORY_CARE_PROVIDER_SITE_OTHER): Payer: Medicare Other | Admitting: Internal Medicine

## 2022-02-04 DIAGNOSIS — D234 Other benign neoplasm of skin of scalp and neck: Secondary | ICD-10-CM | POA: Diagnosis not present

## 2022-02-04 NOTE — Progress Notes (Signed)
? ?  Subjective:  ? ?Patient ID: Heather Grant, female    DOB: Dec 10, 1940, 81 y.o.   MRN: [id number] ? ?HPI ?The patient is a 81 YO female coming in for lesion on chin. ? ?Review of Systems  ?Constitutional: Negative.   ?HENT: Negative.    ?Eyes: Negative.   ?Respiratory:  Negative for cough, chest tightness and shortness of breath.   ?Cardiovascular:  Negative for chest pain, palpitations and leg swelling.  ?Gastrointestinal:  Negative for abdominal distention, abdominal pain, constipation, diarrhea, nausea and vomiting.  ?Musculoskeletal:  Positive for arthralgias.  ?Skin: Negative.   ?Neurological: Negative.   ?Psychiatric/Behavioral: Negative.    ? ?Objective:  ?Physical Exam ?Constitutional:   ?   Appearance: She is well-developed.  ?HENT:  ?   Head: Normocephalic and atraumatic.  ?   Ears:  ?   Comments: Cyst on the right chin without signs of infection ?Cardiovascular:  ?   Rate and Rhythm: Normal rate and regular rhythm.  ?Pulmonary:  ?   Effort: Pulmonary effort is normal. No respiratory distress.  ?   Breath sounds: Normal breath sounds. No wheezing or rales.  ?Abdominal:  ?   General: Bowel sounds are normal. There is no distension.  ?   Palpations: Abdomen is soft.  ?   Tenderness: There is no abdominal tenderness. There is no rebound.  ?Musculoskeletal:  ?   Cervical back: Normal range of motion.  ?Skin: ?   General: Skin is warm and dry.  ?Neurological:  ?   Mental Status: She is alert and oriented to person, place, and time.  ?   Coordination: Coordination abnormal.  ?   Comments: walker  ? ? ?Vitals:  ? 02/04/22 1044  ?BP: 126/76  ?Pulse: (!) 57  ?Temp: 98.6 ?F (37 ?C)  ?TempSrc: Oral  ?SpO2: 99%  ?Weight: 199 lb 4 oz (90.4 kg)  ? ? ?This visit occurred during the SARS-CoV-2 public health emergency.  Safety protocols were in place, including screening questions prior to the visit, additional usage of staff PPE, and extensive cleaning of exam room while observing appropriate contact time as  indicated for disinfecting solutions.  ? ?Assessment & Plan:  ?Visit time 15 minutes in face to face communication with patient and coordination of care, additional 5 minutes spent in record review, coordination or care, ordering tests, communicating/referring to other healthcare professionals, documenting in medical records all on the same day of the visit for total time 20 minutes spent on the visit.  ? ?

## 2022-02-04 NOTE — Patient Instructions (Signed)
We will watch this spot and it may drain on its own. If this grows or is red or hurting let us know. ? ? ?

## 2022-02-05 ENCOUNTER — Telehealth: Payer: Self-pay | Admitting: Internal Medicine

## 2022-02-05 DIAGNOSIS — E785 Hyperlipidemia, unspecified: Secondary | ICD-10-CM

## 2022-02-05 MED ORDER — ROSUVASTATIN CALCIUM 20 MG PO TABS: 20.0000 mg | ORAL_TABLET | Freq: Every day | ORAL | 3 refills | Status: DC

## 2022-02-05 MED ORDER — FAMOTIDINE 20 MG PO TABS: ORAL_TABLET | ORAL | 3 refills | Status: DC

## 2022-02-05 MED ORDER — MELOXICAM 15 MG PO TABS: 15.0000 mg | ORAL_TABLET | Freq: Every day | ORAL | 0 refills | Status: DC

## 2022-02-05 MED ORDER — METOPROLOL TARTRATE 25 MG PO TABS: 25.0000 mg | ORAL_TABLET | Freq: Two times a day (BID) | ORAL | 1 refills | Status: DC

## 2022-02-05 MED ORDER — DULERA 100-5 MCG/ACT IN AERO: INHALATION_SPRAY | RESPIRATORY_TRACT | 0 refills | Status: DC

## 2022-02-05 NOTE — Telephone Encounter (Signed)
Refills have been sent to mail order pharmacy  ?

## 2022-02-05 NOTE — Telephone Encounter (Signed)
Pt requesting following rxs sent to: ? ?[facility] ([facility] ) [address], [place] ? ? ?metoprolol tartrate (LOPRESSOR) 25 MG tablet ? ?famotidine (PEPCID) 20 MG tablet ? ?albuterol (VENTOLIN HFA) 108 (90 Base) MCG/ACT inhaler ? ?rosuvastatin (CRESTOR) 20 MG tablet ? ?DULERA 100-5 MCG/ACT AERO ? ?meloxicam (MOBIC) 15 MG tablet ? ?

## 2022-02-06 DIAGNOSIS — D234 Other benign neoplasm of skin of scalp and neck: Secondary | ICD-10-CM | POA: Insufficient documentation

## 2022-02-06 NOTE — Assessment & Plan Note (Signed)
Benign in appearance, not infected. Counseled that she can use heat/ice for pain. If desired could refer to dermatology for removal. No indication for antibiotics today.  ?

## 2022-02-12 ENCOUNTER — Other Ambulatory Visit: Payer: Self-pay | Admitting: Internal Medicine

## 2022-02-25 ENCOUNTER — Other Ambulatory Visit: Payer: Self-pay | Admitting: Internal Medicine

## 2022-02-27 ENCOUNTER — Telehealth: Payer: Self-pay

## 2022-02-27 NOTE — Telephone Encounter (Signed)
Lakeisha from [facility] is calling behalf of the pt requesting a Rx refill for: ?colchicine 0.6 MG tablet ? ?Pharmacy: ?[facility] ([facility] ) - [place], [place] ? ?LOV 02/04/22 ?

## 2022-03-02 DIAGNOSIS — J45998 Other asthma: Secondary | ICD-10-CM | POA: Diagnosis not present

## 2022-03-02 DIAGNOSIS — R0602 Shortness of breath: Secondary | ICD-10-CM | POA: Diagnosis not present

## 2022-03-02 MED ORDER — COLCHICINE 0.6 MG PO TABS: ORAL_TABLET | ORAL | 1 refills | Status: DC

## 2022-03-02 NOTE — Telephone Encounter (Signed)
Refill has been sent to the pt's mail order pharmacy as requested.  ?

## 2022-03-12 DIAGNOSIS — M170 Bilateral primary osteoarthritis of knee: Secondary | ICD-10-CM | POA: Diagnosis not present

## 2022-03-18 ENCOUNTER — Telehealth: Payer: Self-pay | Admitting: Internal Medicine

## 2022-03-18 DIAGNOSIS — D234 Other benign neoplasm of skin of scalp and neck: Secondary | ICD-10-CM

## 2022-03-18 NOTE — Telephone Encounter (Signed)
Pt requesting a cb regarding a bump that itches on her chin x1d ?

## 2022-03-19 NOTE — Telephone Encounter (Signed)
Pt daughter is calling to check on the status of the CB about the referral to dermatology for the bump/blister on the Chin that has been there for a while.  ? ?Please advise ?

## 2022-03-20 NOTE — Telephone Encounter (Signed)
Rep w/ [facility] states pt made a complaint that she has is not receiving care regarding spot on her chin ? ?Informed rep cma called and spoke w/ pt today regarding spot on her chin and pt is aware a referral has been placed to dermatologist  ? ?Rep states pt did not informed them of the returned call from the [place]  ? ?Advised rep if pt would like to bee seen, she can schedule an appt to see provider next week ?

## 2022-03-20 NOTE — Telephone Encounter (Signed)
We previously had offered referral to dermatology if she desired removal. This is not a dangerous spot. Does she desire to see derm for removal? ?

## 2022-03-20 NOTE — Telephone Encounter (Signed)
Spoke with the patient and she would like to see dermatology. She stated that it was a lot of itching, used otc itching cream that helped with the itching. Bump has not increased in size.  ?

## 2022-03-24 ENCOUNTER — Other Ambulatory Visit: Payer: Self-pay | Admitting: Internal Medicine

## 2022-04-01 DIAGNOSIS — R0602 Shortness of breath: Secondary | ICD-10-CM | POA: Diagnosis not present

## 2022-04-01 DIAGNOSIS — J45998 Other asthma: Secondary | ICD-10-CM | POA: Diagnosis not present

## 2022-04-06 ENCOUNTER — Telehealth: Payer: Self-pay | Admitting: Internal Medicine

## 2022-04-06 DIAGNOSIS — J45901 Unspecified asthma with (acute) exacerbation: Secondary | ICD-10-CM

## 2022-04-06 MED ORDER — ALBUTEROL SULFATE HFA 108 (90 BASE) MCG/ACT IN AERS: 1.0000 | INHALATION_SPRAY | Freq: Four times a day (QID) | RESPIRATORY_TRACT | 3 refills | Status: DC | PRN

## 2022-04-06 NOTE — Telephone Encounter (Signed)
Refill has been sent to the pt's mail order pharmacy. ?

## 2022-04-06 NOTE — Telephone Encounter (Signed)
Patient needs her albuterol refilled - She would like it sent to [facility] ?

## 2022-04-09 NOTE — Telephone Encounter (Signed)
Refill has been sent to [facility]  

## 2022-04-09 NOTE — Telephone Encounter (Signed)
Pt is calling to request a Rx be sent for Albuterol HFA Proair inhaler in order insurance to cover it. ? ?Please advise ?

## 2022-05-02 DIAGNOSIS — J45998 Other asthma: Secondary | ICD-10-CM | POA: Diagnosis not present

## 2022-05-02 DIAGNOSIS — R0602 Shortness of breath: Secondary | ICD-10-CM | POA: Diagnosis not present

## 2022-05-06 ENCOUNTER — Other Ambulatory Visit: Payer: Self-pay | Admitting: Internal Medicine

## 2022-05-07 DIAGNOSIS — L728 Other follicular cysts of the skin and subcutaneous tissue: Secondary | ICD-10-CM | POA: Diagnosis not present

## 2022-05-09 ENCOUNTER — Other Ambulatory Visit: Payer: Self-pay | Admitting: Internal Medicine

## 2022-05-12 ENCOUNTER — Telehealth: Payer: Self-pay

## 2022-05-12 MED ORDER — COLCHICINE 0.6 MG PO TABS: ORAL_TABLET | ORAL | 1 refills | Status: DC

## 2022-05-12 NOTE — Telephone Encounter (Signed)
Pt is requesting a refill on: colchicine 0.6 MG tablet  Pharmacy: [facility] ) - [place], [place] 02/04/22

## 2022-05-12 NOTE — Telephone Encounter (Signed)
Refill has been sent to the pt's pharmacy  

## 2022-05-17 ENCOUNTER — Other Ambulatory Visit: Payer: Self-pay | Admitting: Internal Medicine

## 2022-05-28 ENCOUNTER — Other Ambulatory Visit: Payer: Self-pay | Admitting: Internal Medicine

## 2022-06-01 ENCOUNTER — Other Ambulatory Visit: Payer: Self-pay | Admitting: Internal Medicine

## 2022-06-01 DIAGNOSIS — R0602 Shortness of breath: Secondary | ICD-10-CM | POA: Diagnosis not present

## 2022-06-01 DIAGNOSIS — J45998 Other asthma: Secondary | ICD-10-CM | POA: Diagnosis not present

## 2022-06-03 ENCOUNTER — Other Ambulatory Visit: Payer: Self-pay | Admitting: Internal Medicine

## 2022-06-03 DIAGNOSIS — H401122 Primary open-angle glaucoma, left eye, moderate stage: Secondary | ICD-10-CM | POA: Diagnosis not present

## 2022-06-03 DIAGNOSIS — H43813 Vitreous degeneration, bilateral: Secondary | ICD-10-CM | POA: Diagnosis not present

## 2022-06-03 DIAGNOSIS — Z961 Presence of intraocular lens: Secondary | ICD-10-CM | POA: Diagnosis not present

## 2022-06-12 DIAGNOSIS — M25562 Pain in left knee: Secondary | ICD-10-CM | POA: Diagnosis not present

## 2022-06-12 DIAGNOSIS — M25561 Pain in right knee: Secondary | ICD-10-CM | POA: Diagnosis not present

## 2022-07-02 DIAGNOSIS — R0602 Shortness of breath: Secondary | ICD-10-CM | POA: Diagnosis not present

## 2022-07-02 DIAGNOSIS — J45998 Other asthma: Secondary | ICD-10-CM | POA: Diagnosis not present

## 2022-07-06 ENCOUNTER — Other Ambulatory Visit: Payer: Self-pay | Admitting: Internal Medicine

## 2022-07-06 DIAGNOSIS — J45901 Unspecified asthma with (acute) exacerbation: Secondary | ICD-10-CM

## 2022-07-20 ENCOUNTER — Other Ambulatory Visit: Payer: Self-pay | Admitting: Internal Medicine

## 2022-08-02 DIAGNOSIS — J45998 Other asthma: Secondary | ICD-10-CM | POA: Diagnosis not present

## 2022-08-02 DIAGNOSIS — R0602 Shortness of breath: Secondary | ICD-10-CM | POA: Diagnosis not present

## 2022-09-01 DIAGNOSIS — J45998 Other asthma: Secondary | ICD-10-CM | POA: Diagnosis not present

## 2022-09-01 DIAGNOSIS — R0602 Shortness of breath: Secondary | ICD-10-CM | POA: Diagnosis not present

## 2022-09-11 DIAGNOSIS — M170 Bilateral primary osteoarthritis of knee: Secondary | ICD-10-CM | POA: Diagnosis not present

## 2022-09-14 ENCOUNTER — Ambulatory Visit (INDEPENDENT_AMBULATORY_CARE_PROVIDER_SITE_OTHER): Payer: Medicare Other | Admitting: Podiatry

## 2022-09-14 DIAGNOSIS — M79676 Pain in unspecified toe(s): Secondary | ICD-10-CM

## 2022-09-14 DIAGNOSIS — B351 Tinea unguium: Secondary | ICD-10-CM | POA: Diagnosis not present

## 2022-09-14 NOTE — Progress Notes (Signed)
   Chief Complaint  Patient presents with   Nail Problem    Patient states that she has bilateral nail thickness and she can not trim herself, she also states that she has a spot that was very itchy on the great toe and she applied itch cream to it and it finally stopped and now it feels like a hard spot.    SUBJECTIVE Patient presents to office today complaining of elongated, thickened nails that cause pain while ambulating in shoes.  Patient is unable to trim their own nails. Patient is here for further evaluation and treatment.  Past Medical History:  Diagnosis Date   Arthritis    hands and knees. May be hips   Gout    great toe, ankles   Hyperlipidemia    Hypertension    Migraines    Nocturnal dyspnea    ONO 2010 with several readings  below 88%   Obesity, Class II, BMI 35-39.9    OSA (obstructive sleep apnea)    mild by study Dec '09. AHI 5.7   Plantar fasciitis    Varicella     No Known Allergies   OBJECTIVE General Patient is awake, alert, and oriented x 3 and in no acute distress. Derm Skin is dry and supple bilateral. Negative open lesions or macerations. Remaining integument unremarkable. Nails are tender, long, thickened and dystrophic with subungual debris, consistent with onychomycosis, 1-5 bilateral. No signs of infection noted. Vasc  DP and PT pedal pulses palpable bilaterally. Temperature gradient within normal limits.  Neuro Epicritic and protective threshold sensation grossly intact bilaterally.  Musculoskeletal Exam No symptomatic pedal deformities noted bilateral. Muscular strength within normal limits.  ASSESSMENT 1.  Pain due to onychomycosis of toenails both  PLAN OF CARE 1. Patient evaluated today.  2. Instructed to maintain good pedal hygiene and foot care.  3. Mechanical debridement of nails 1-5 bilaterally performed using a nail nipper. Filed with dremel without incident.  4. Return to clinic in 3 mos.    Edrick Kins, DPM [facility]  Dr. Edrick Kins, DPM    [address], [place] [address]                Office [phone]  Fax [phone]

## 2022-10-02 DIAGNOSIS — R0602 Shortness of breath: Secondary | ICD-10-CM | POA: Diagnosis not present

## 2022-10-02 DIAGNOSIS — J45998 Other asthma: Secondary | ICD-10-CM | POA: Diagnosis not present

## 2022-10-12 ENCOUNTER — Other Ambulatory Visit: Payer: Self-pay | Admitting: Internal Medicine

## 2022-10-16 ENCOUNTER — Ambulatory Visit (INDEPENDENT_AMBULATORY_CARE_PROVIDER_SITE_OTHER): Payer: Medicare Other | Admitting: Internal Medicine

## 2022-10-16 ENCOUNTER — Encounter: Payer: Self-pay | Admitting: Internal Medicine

## 2022-10-16 VITALS — BP 124/70 | HR 58 | Temp 98.2°F | Ht 59.0 in | Wt 176.0 lb

## 2022-10-16 DIAGNOSIS — M159 Polyosteoarthritis, unspecified: Secondary | ICD-10-CM | POA: Diagnosis not present

## 2022-10-16 DIAGNOSIS — Z0000 Encounter for general adult medical examination without abnormal findings: Secondary | ICD-10-CM

## 2022-10-16 DIAGNOSIS — J4520 Mild intermittent asthma, uncomplicated: Secondary | ICD-10-CM | POA: Diagnosis not present

## 2022-10-16 DIAGNOSIS — E782 Mixed hyperlipidemia: Secondary | ICD-10-CM | POA: Diagnosis not present

## 2022-10-16 DIAGNOSIS — I10 Essential (primary) hypertension: Secondary | ICD-10-CM

## 2022-10-16 DIAGNOSIS — K219 Gastro-esophageal reflux disease without esophagitis: Secondary | ICD-10-CM | POA: Diagnosis not present

## 2022-10-16 DIAGNOSIS — R202 Paresthesia of skin: Secondary | ICD-10-CM

## 2022-10-16 DIAGNOSIS — R200 Anesthesia of skin: Secondary | ICD-10-CM | POA: Diagnosis not present

## 2022-10-16 DIAGNOSIS — M1A0710 Idiopathic chronic gout, right ankle and foot, without tophus (tophi): Secondary | ICD-10-CM

## 2022-10-16 LAB — LIPID PANEL
Cholesterol: 136 mg/dL (ref 0–200)
HDL: 52.7 mg/dL (ref >39.00)
LDL Cholesterol: 67 mg/dL (ref 0–99)
NonHDL: 83.72
Total CHOL/HDL Ratio: 3
Triglycerides: 82 mg/dL (ref 0.0–149.0)
VLDL: 16.4 mg/dL (ref 0.0–40.0)

## 2022-10-16 LAB — COMPREHENSIVE METABOLIC PANEL
ALT: 15 U/L (ref 0–35)
AST: 17 U/L (ref 0–37)
Albumin: 4.1 g/dL (ref 3.5–5.2)
Alkaline Phosphatase: 54 U/L (ref 39–117)
BUN: 9 mg/dL (ref 6–23)
CO2: 29 mEq/L (ref 19–32)
Calcium: 9.9 mg/dL (ref 8.4–10.5)
Chloride: 109 mEq/L (ref 96–112)
Creatinine, Ser: 1.25 mg/dL — ABNORMAL HIGH (ref 0.40–1.20)
GFR: 40.95 mL/min — ABNORMAL LOW (ref >60.00)
Glucose, Bld: 88 mg/dL (ref 70–99)
Potassium: 3.7 mEq/L (ref 3.5–5.1)
Sodium: 145 mEq/L (ref 135–145)
Total Bilirubin: 0.7 mg/dL (ref 0.2–1.2)
Total Protein: 6.3 g/dL (ref 6.0–8.3)

## 2022-10-16 LAB — VITAMIN D 25 HYDROXY (VIT D DEFICIENCY, FRACTURES): VITD: 62.01 ng/mL (ref 30.00–100.00)

## 2022-10-16 LAB — TSH: TSH: 0.73 u[IU]/mL (ref 0.35–5.50)

## 2022-10-16 LAB — CBC
HCT: 40.9 % (ref 36.0–46.0)
Hemoglobin: 13.7 g/dL (ref 12.0–15.0)
MCHC: 33.4 g/dL (ref 30.0–36.0)
MCV: 94.6 fl (ref 78.0–100.0)
Platelets: 274 10*3/uL (ref 150.0–400.0)
RBC: 4.33 Mil/uL (ref 3.87–5.11)
RDW: 13.1 % (ref 11.5–15.5)
WBC: 6 10*3/uL (ref 4.0–10.5)

## 2022-10-16 LAB — VITAMIN B12: Vitamin B-12: 145 pg/mL — ABNORMAL LOW (ref 211–911)

## 2022-10-16 LAB — URIC ACID: Uric Acid, Serum: 7.9 mg/dL — ABNORMAL HIGH (ref 2.4–7.0)

## 2022-10-16 NOTE — Assessment & Plan Note (Signed)
Uses symbicort and albuterol prn and no flare today.

## 2022-10-16 NOTE — Assessment & Plan Note (Signed)
BMI 35 and complicated by osteoarthritis and hypertension and hyperlipidemia. Down 20 pounds since last year and encouraged to continue with diet. Unable to exercise due to severe OA knees.

## 2022-10-16 NOTE — Assessment & Plan Note (Signed)
Checking lipid panel and adjust crestor 20 mg daily as needed.

## 2022-10-16 NOTE — Assessment & Plan Note (Signed)
Controlled on pepcid 20 mg qhs and protonix 40 mg daily. Stable so continue.

## 2022-10-16 NOTE — Progress Notes (Signed)
   Subjective:   Patient ID: Heather Grant, female    DOB: 10/20/1941, 81 y.o.   MRN: [id number]  HPI The patient is here for physical. Weight down 20 pounds since last year.  PMH, [facility], social history reviewed and updated  Review of Systems  Constitutional: Negative.   HENT: Negative.    Eyes: Negative.   Respiratory:  Negative for cough, chest tightness and shortness of breath.   Cardiovascular:  Negative for chest pain, palpitations and leg swelling.  Gastrointestinal:  Negative for abdominal distention, abdominal pain, constipation, diarrhea, nausea and vomiting.  Musculoskeletal:  Positive for arthralgias and gait problem. Negative for back pain.  Skin: Negative.   Psychiatric/Behavioral: Negative.      Objective:  Physical Exam Constitutional:      Appearance: She is well-developed.  HENT:     Head: Normocephalic and atraumatic.  Cardiovascular:     Rate and Rhythm: Normal rate and regular rhythm.  Pulmonary:     Effort: Pulmonary effort is normal. No respiratory distress.     Breath sounds: Normal breath sounds. No wheezing or rales.  Abdominal:     General: Bowel sounds are normal. There is no distension.     Palpations: Abdomen is soft.     Tenderness: There is no abdominal tenderness. There is no rebound.  Musculoskeletal:        General: Tenderness present.     Cervical back: Normal range of motion.  Skin:    General: Skin is warm and dry.  Neurological:     Mental Status: She is alert and oriented to person, place, and time.     Coordination: Coordination abnormal.     Comments: Wheeled walker     Vitals:   10/16/22 1401  BP: 124/70  Pulse: (!) 58  Temp: 98.2 F (36.8 C)  TempSrc: Oral  SpO2: 97%  Weight: 176 lb (79.8 kg)  Height: '4\' 11"'$  (1.499 m)    Assessment & Plan:

## 2022-10-16 NOTE — Assessment & Plan Note (Signed)
Flu shot declines. Covid-19 counseled. Pneumonia complete. Shingrix due at pharmacy. Tetanus due at pharmacy. Colonoscopy aged out. Mammogram aged out, pap smear aged out and dexa complete. Counseled about sun safety and mole surveillance. Counseled about the dangers of distracted driving. Given 10 year screening recommendations.

## 2022-10-16 NOTE — Patient Instructions (Signed)
You can get the shingles vaccine at the pharmacy.

## 2022-10-16 NOTE — Assessment & Plan Note (Signed)
Checking TSH and B12 and vitamin D and treat as appropriate.

## 2022-10-16 NOTE — Assessment & Plan Note (Signed)
No flare, checking uric acid goal <6.

## 2022-10-16 NOTE — Assessment & Plan Note (Signed)
BP at goal on hydralazine 25 mg TID and metoprolol 25 mg BID and checking CMP and adjust as needed.

## 2022-10-16 NOTE — Assessment & Plan Note (Signed)
Overall stable taking meloxicam 15 mg daily and does not want surgery on her knees. Has severe OA bilaterally.

## 2022-10-18 ENCOUNTER — Other Ambulatory Visit: Payer: Self-pay | Admitting: Internal Medicine

## 2022-10-18 DIAGNOSIS — E785 Hyperlipidemia, unspecified: Secondary | ICD-10-CM

## 2022-10-20 NOTE — Progress Notes (Signed)
[facility]

## 2022-10-27 ENCOUNTER — Other Ambulatory Visit: Payer: Self-pay | Admitting: Internal Medicine

## 2022-10-28 ENCOUNTER — Other Ambulatory Visit: Payer: Self-pay | Admitting: Internal Medicine

## 2022-10-30 ENCOUNTER — Ambulatory Visit (INDEPENDENT_AMBULATORY_CARE_PROVIDER_SITE_OTHER): Payer: Medicare Other | Admitting: *Deleted

## 2022-10-30 DIAGNOSIS — E538 Deficiency of other specified B group vitamins: Secondary | ICD-10-CM

## 2022-10-30 MED ORDER — CYANOCOBALAMIN 1000 MCG/ML IJ SOLN
1000.0000 ug | Freq: Once | INTRAMUSCULAR | Status: AC
  Administered 2022-10-30: 1000 ug via INTRAMUSCULAR

## 2022-10-30 NOTE — Progress Notes (Signed)
Pls cosign for B12 inj../lmb  

## 2022-11-01 DIAGNOSIS — R0602 Shortness of breath: Secondary | ICD-10-CM | POA: Diagnosis not present

## 2022-11-01 DIAGNOSIS — J45998 Other asthma: Secondary | ICD-10-CM | POA: Diagnosis not present

## 2022-11-13 ENCOUNTER — Ambulatory Visit (INDEPENDENT_AMBULATORY_CARE_PROVIDER_SITE_OTHER): Payer: Medicare Other

## 2022-11-13 DIAGNOSIS — E538 Deficiency of other specified B group vitamins: Secondary | ICD-10-CM | POA: Diagnosis not present

## 2022-11-13 MED ORDER — CYANOCOBALAMIN 1000 MCG/ML IJ SOLN
1000.0000 ug | Freq: Once | INTRAMUSCULAR | Status: AC
  Administered 2022-11-13: 1000 ug via INTRAMUSCULAR

## 2022-11-13 NOTE — Progress Notes (Signed)
Pt received B12 shot with no complications

## 2022-11-27 ENCOUNTER — Ambulatory Visit (INDEPENDENT_AMBULATORY_CARE_PROVIDER_SITE_OTHER): Payer: Medicare Other | Admitting: *Deleted

## 2022-11-27 ENCOUNTER — Other Ambulatory Visit (HOSPITAL_BASED_OUTPATIENT_CLINIC_OR_DEPARTMENT_OTHER): Payer: Self-pay

## 2022-11-27 DIAGNOSIS — E538 Deficiency of other specified B group vitamins: Secondary | ICD-10-CM | POA: Diagnosis not present

## 2022-11-27 MED ORDER — COMIRNATY 30 MCG/0.3ML IM SUSY
PREFILLED_SYRINGE | INTRAMUSCULAR | 0 refills | Status: DC
  Filled 2022-11-27: qty 0.3, 1d supply, fill #0

## 2022-11-27 MED ORDER — CYANOCOBALAMIN 1000 MCG/ML IJ SOLN
1000.0000 ug | Freq: Once | INTRAMUSCULAR | Status: AC
  Administered 2022-11-27: 1000 ug via INTRAMUSCULAR

## 2022-11-27 NOTE — Progress Notes (Signed)
Pls cosign for B12 inj../lmb  

## 2022-12-02 ENCOUNTER — Ambulatory Visit: Payer: Medicare Other

## 2022-12-02 DIAGNOSIS — R0602 Shortness of breath: Secondary | ICD-10-CM | POA: Diagnosis not present

## 2022-12-02 DIAGNOSIS — J45998 Other asthma: Secondary | ICD-10-CM | POA: Diagnosis not present

## 2022-12-09 DIAGNOSIS — H43813 Vitreous degeneration, bilateral: Secondary | ICD-10-CM | POA: Diagnosis not present

## 2022-12-09 DIAGNOSIS — Z961 Presence of intraocular lens: Secondary | ICD-10-CM | POA: Diagnosis not present

## 2022-12-09 DIAGNOSIS — H401122 Primary open-angle glaucoma, left eye, moderate stage: Secondary | ICD-10-CM | POA: Diagnosis not present

## 2022-12-16 DIAGNOSIS — M170 Bilateral primary osteoarthritis of knee: Secondary | ICD-10-CM | POA: Diagnosis not present

## 2022-12-28 ENCOUNTER — Ambulatory Visit (INDEPENDENT_AMBULATORY_CARE_PROVIDER_SITE_OTHER): Payer: 59

## 2022-12-28 DIAGNOSIS — E538 Deficiency of other specified B group vitamins: Secondary | ICD-10-CM

## 2022-12-28 MED ORDER — CYANOCOBALAMIN 1000 MCG/ML IJ SOLN
1000.0000 ug | Freq: Once | INTRAMUSCULAR | Status: AC
  Administered 2022-12-28: 1000 ug via INTRAMUSCULAR

## 2022-12-28 NOTE — Progress Notes (Signed)
After obtaining consent, and per orders of Dr. Sharlet Salina, injection of B12 given by Marrian Salvage. Patient instructed to report any adverse reaction to me immediately.

## 2023-01-02 DIAGNOSIS — R0602 Shortness of breath: Secondary | ICD-10-CM | POA: Diagnosis not present

## 2023-01-02 DIAGNOSIS — J45998 Other asthma: Secondary | ICD-10-CM | POA: Diagnosis not present

## 2023-01-13 ENCOUNTER — Ambulatory Visit: Payer: 59 | Admitting: Internal Medicine

## 2023-01-18 ENCOUNTER — Ambulatory Visit (INDEPENDENT_AMBULATORY_CARE_PROVIDER_SITE_OTHER): Payer: 59

## 2023-01-18 VITALS — Ht 59.0 in | Wt 175.0 lb

## 2023-01-18 DIAGNOSIS — Z0000 Encounter for general adult medical examination without abnormal findings: Secondary | ICD-10-CM

## 2023-01-18 DIAGNOSIS — Z789 Other specified health status: Secondary | ICD-10-CM
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
## 2023-01-18 NOTE — Patient Instructions (Addendum)
Ms. Heather Grant , Thank you for taking time to come for your Medicare Wellness Visit. I appreciate your ongoing commitment to your health goals. Please review the following plan we discussed and let me know if I can assist you in the future.   These are the goals we discussed:  Goals      Stay independent as possible.        This is a list of the screening recommended for you and due dates:  Health Maintenance  Topic Date Due   Hepatitis C Screening: USPSTF Recommendation to screen - Ages 80-79 yo.  Never done   Zoster (Shingles) Vaccine (1 of 2) Never done   DTaP/Tdap/Td vaccine (1 - Tdap) 08/24/2012   COVID-19 Vaccine (5 - 2023-24 season) 01/22/2023   Flu Shot  02/28/2023*   Mammogram  01/29/2023   Medicare Annual Wellness Visit  01/19/2024   Pneumonia Vaccine  Completed   DEXA scan (bone density measurement)  Completed   HPV Vaccine  Aged Out  *Topic was postponed. The date shown is not the original due date.    Advanced directives: No; Advance directive discussed with you today. I have provided a copy for you to complete at home and have notarized. Once this is complete please bring a copy in to our office so we can scan it into your chart.  Conditions/risks identified: Yes  Next appointment: Follow up in one year for your annual wellness visit.   Preventive Care 17 Years and Older, Female Preventive care refers to lifestyle choices and visits with your health care provider that can promote health and wellness. What does preventive care include? A yearly physical exam. This is also called an annual well check. Dental exams once or twice a year. Routine eye exams. Ask your health care provider how often you should have your eyes checked. Personal lifestyle choices, including: Daily care of your teeth and gums. Regular physical activity. Eating a healthy diet. Avoiding tobacco and drug use. Limiting alcohol use. Practicing safe sex. Taking low-dose aspirin every  day. Taking vitamin and mineral supplements as recommended by your health care provider. What happens during an annual well check? The services and screenings done by your health care provider during your annual well check will depend on your age, overall health, lifestyle risk factors, and family history of disease. Counseling  Your health care provider may ask you questions about your: Alcohol use. Tobacco use. Drug use. Emotional well-being. Home and relationship well-being. Sexual activity. Eating habits. History of falls. Memory and ability to understand (cognition). Work and work Statistician. Reproductive health. Screening  You may have the following tests or measurements: Height, weight, and BMI. Blood pressure. Lipid and cholesterol levels. These may be checked every 5 years, or more frequently if you are over 77 years old. Skin check. Lung cancer screening. You may have this screening every year starting at age 80 if you have a 30-pack-year history of smoking and currently smoke or have quit within the past 15 years. Fecal occult blood test (FOBT) of the stool. You may have this test every year starting at age 81. Flexible sigmoidoscopy or colonoscopy. You may have a sigmoidoscopy every 5 years or a colonoscopy every 10 years starting at age 68. Hepatitis C blood test. Hepatitis B blood test. Sexually transmitted disease (STD) testing. Diabetes screening. This is done by checking your blood sugar (glucose) after you have not eaten for a while (fasting). You may have this done every 1-3 years. Bone density  scan. This is done to screen for osteoporosis. You may have this done starting at age 52. Mammogram. This may be done every 1-2 years. Talk to your health care provider about how often you should have regular mammograms. Talk with your health care provider about your test results, treatment options, and if necessary, the need for more tests. Vaccines  Your health care  provider may recommend certain vaccines, such as: Influenza vaccine. This is recommended every year. Tetanus, diphtheria, and acellular pertussis (Tdap, Td) vaccine. You may need a Td booster every 10 years. Zoster vaccine. You may need this after age 3. Pneumococcal 13-valent conjugate (PCV13) vaccine. One dose is recommended after age 72. Pneumococcal polysaccharide (PPSV23) vaccine. One dose is recommended after age 32. Talk to your health care provider about which screenings and vaccines you need and how often you need them. This information is not intended to replace advice given to you by your health care provider. Make sure you discuss any questions you have with your health care provider. Document Released: 12/13/2015 Document Revised: 08/05/2016 Document Reviewed: 09/17/2015 Elsevier Interactive Patient Education  2017 [employer] Prevention in the Home Falls can cause injuries. They can happen to people of all ages. There are many things you can do to make your home safe and to help prevent falls. What can I do on the outside of my home? Regularly fix the edges of walkways and driveways and fix any cracks. Remove anything that might make you trip as you walk through a door, such as a raised step or threshold. Trim any bushes or trees on the path to your home. Use bright outdoor lighting. Clear any walking paths of anything that might make someone trip, such as rocks or tools. Regularly check to see if handrails are loose or broken. Make sure that both sides of any steps have handrails. Any raised decks and porches should have guardrails on the edges. Have any leaves, snow, or ice cleared regularly. Use sand or salt on walking paths during winter. Clean up any spills in your garage right away. This includes oil or grease spills. What can I do in the bathroom? Use night lights. Install grab bars by the toilet and in the tub and shower. Do not use towel bars as grab  bars. Use non-skid mats or decals in the tub or shower. If you need to sit down in the shower, use a plastic, non-slip stool. Keep the floor dry. Clean up any water that spills on the floor as soon as it happens. Remove soap buildup in the tub or shower regularly. Attach bath mats securely with double-sided non-slip rug tape. Do not have throw rugs and other things on the floor that can make you trip. What can I do in the bedroom? Use night lights. Make sure that you have a light by your bed that is easy to reach. Do not use any sheets or blankets that are too big for your bed. They should not hang down onto the floor. Have a firm chair that has side arms. You can use this for support while you get dressed. Do not have throw rugs and other things on the floor that can make you trip. What can I do in the kitchen? Clean up any spills right away. Avoid walking on wet floors. Keep items that you use a lot in easy-to-reach places. If you need to reach something above you, use a strong step stool that has a grab bar. Keep electrical  cords out of the way. Do not use floor polish or wax that makes floors slippery. If you must use wax, use non-skid floor wax. Do not have throw rugs and other things on the floor that can make you trip. What can I do with my stairs? Do not leave any items on the stairs. Make sure that there are handrails on both sides of the stairs and use them. Fix handrails that are broken or loose. Make sure that handrails are as long as the stairways. Check any carpeting to make sure that it is firmly attached to the stairs. Fix any carpet that is loose or worn. Avoid having throw rugs at the top or bottom of the stairs. If you do have throw rugs, attach them to the floor with carpet tape. Make sure that you have a light switch at the top of the stairs and the bottom of the stairs. If you do not have them, ask someone to add them for you. What else can I do to help prevent  falls? Wear shoes that: Do not have high heels. Have rubber bottoms. Are comfortable and fit you well. Are closed at the toe. Do not wear sandals. If you use a stepladder: Make sure that it is fully opened. Do not climb a closed stepladder. Make sure that both sides of the stepladder are locked into place. Ask someone to hold it for you, if possible. Clearly mark and make sure that you can see: Any grab bars or handrails. First and last steps. Where the edge of each step is. Use tools that help you move around (mobility aids) if they are needed. These include: Canes. Walkers. Scooters. Crutches. Turn on the lights when you go into a dark area. Replace any light bulbs as soon as they burn out. Set up your furniture so you have a clear path. Avoid moving your furniture around. If any of your floors are uneven, fix them. If there are any pets around you, be aware of where they are. Review your medicines with your doctor. Some medicines can make you feel dizzy. This can increase your chance of falling. Ask your doctor what other things that you can do to help prevent falls. This information is not intended to replace advice given to you by your health care provider. Make sure you discuss any questions you have with your health care provider. Document Released: 09/12/2009 Document Revised: 04/23/2016 Document Reviewed: 12/21/2014 Elsevier Interactive Patient Education  2017 [employer].

## 2023-01-19 ENCOUNTER — Telehealth: Payer: Self-pay | Admitting: *Deleted

## 2023-01-19 ENCOUNTER — Telehealth: Payer: Self-pay

## 2023-01-19 NOTE — Progress Notes (Signed)
  Care Coordination   Note   01/19/2023 Name: Heather Grant MRN: [id number] DOB: 1941-04-30  Heather Grant is a 82 y.o. year old female who sees Hoyt Koch, MD for primary care. I reached out to [facility] by phone today to offer care coordination services.  [name] was given information about Care Coordination services today including:   The Care Coordination services include support from the care team which includes your Nurse Coordinator, Clinical Social Worker, or Pharmacist.  The Care Coordination team is here to help remove barriers to the health concerns and goals most important to you. Care Coordination services are voluntary, and the patient may decline or stop services at any time by request to their care team member.   Care Coordination Consent Status: Patient agreed to services and verbal consent obtained.   Follow up plan:  Telephone appointment with care coordination team member scheduled for:  01/21/23  Encounter Outcome:  Pt. Scheduled  [place]  Direct Dial: [phone]

## 2023-01-19 NOTE — Telephone Encounter (Signed)
   Telephone encounter was:  Successful.  01/19/2023 Name: Heather Grant MRN: [id number] DOB: 1941-06-01  Heather Grant is a 82 y.o. year old female who is a primary care patient of Hoyt Koch, MD . The community resource team was consulted for assistance with Transportation Needs  and housing.  Care guide performed the following interventions: Spoke to patient she currently has section 8 housing and there is a 3 year wait, patient also has transportation benefit through [employer] and has a SCAT application.  Patient stated that she has has problems with [facility] picking her up from appointments.  I told her I will send her information/complaint about [facility] to [employer]. She gave consent to send a NCCARE360 referral to [employer].  Follow Up Plan:  No further follow up planned at this time. The patient has been provided with needed resources.  [place] Resource Care Guide   [email or web address]  ?? [id number]   Website: [email or web address]  [email or web address]

## 2023-01-20 DIAGNOSIS — R208 Other disturbances of skin sensation: Secondary | ICD-10-CM | POA: Diagnosis not present

## 2023-01-20 DIAGNOSIS — L723 Sebaceous cyst: Secondary | ICD-10-CM | POA: Diagnosis not present

## 2023-01-20 DIAGNOSIS — L728 Other follicular cysts of the skin and subcutaneous tissue: Secondary | ICD-10-CM | POA: Diagnosis not present

## 2023-01-20 DIAGNOSIS — L538 Other specified erythematous conditions: Secondary | ICD-10-CM | POA: Diagnosis not present

## 2023-01-21 ENCOUNTER — Ambulatory Visit: Payer: Self-pay | Admitting: Licensed Clinical Social Worker

## 2023-01-21 ENCOUNTER — Telehealth: Payer: Self-pay | Admitting: Licensed Clinical Social Worker

## 2023-01-21 NOTE — Patient Outreach (Signed)
  Care Coordination  Initial Visit Note   01/21/2023 Name: Heather Grant MRN: [id number] DOB: [birth year]  Heather Grant is a 82 y.o. year old female who sees Hoyt Koch, MD for primary care. I spoke with  Franchot Mimes by phone today.  What matters to the patients health and wellness today?  Transportation and Housing  Assessed patient's needs, support system and barriers to care.  Patient currently lives in sec. 8 housing for the past 20 years. Reports she wants to move did not feel safe in unit but is doing better since she has security camera. She also has a case worker with [employer]. Currently uses [facility] Medicare transportation, however not satisfied with service. Patient is able to complete ADL's and has support system with daughter and granddaughter.    Recommendation: Patient may benefit from, and is in agreement to follow activity task listed below.   Goals Addressed             This Visit's Progress    Connect with [employer] to address transporation and housing concern       Activities and task to complete in order to accomplish goals.   Call or go to Department of Social Services to inquire about [employer]; phone number provided Follow up with housing resources discussed (Call [employer] for list of sec. 8 apartments ) Complete SCAT application Ask your granddaughter to assist you          SDOH assessments and interventions completed:  No  Care Coordination Interventions:  Yes, provided  Interventions Today    Flowsheet Row Most Recent Value  Chronic Disease   Chronic disease during today's visit Hypertension (HTN)  General Interventions   General Interventions Discussed/Reviewed Level of Care, General Interventions Discussed, General Interventions Reviewed, Community Resources  Level of [place]  [able to complete ADL's on her own]  Education Interventions    Education Provided Provided Education  Provided Verbal Education On [employer]  [provided information for transportation and housing options]  Louisa Reviewed  Rebeca Allegra related to transporation]  Safety Interventions   Safety Discussed/Reviewed Home Safety, Safety Reviewed  [feeling better about her home now that she has a camera]       Follow up plan: Follow up call scheduled for 30 days    Encounter Outcome:  Pt. Visit Completed   Casimer Lanius, [place] [phone]

## 2023-01-21 NOTE — Patient Instructions (Signed)
    I am sorry you were unable to keep your phone appointment today.   The Care Guide will contact you to reschedule the phone appointment    Attie Nawabi, LCSW Social Work Care Coordination  [place] [facility] [phone]  

## 2023-01-21 NOTE — Patient Outreach (Signed)
  Care Coordination   01/21/2023 Name: Heather Grant MRN: [id number] DOB: Jan 11, 1941   Care Coordination Outreach Attempts:  An unsuccessful telephone outreach was attempted for a scheduled appointment today.  Follow Up Plan:  Additional outreach attempts will be made to offer the patient care coordination information and services.   Encounter Outcome:  No Answer   Care Coordination Interventions:  No, not indicated    Casimer Lanius, [place] [phone]

## 2023-01-21 NOTE — Patient Instructions (Signed)
Visit Information  Thank you for taking time to visit with me today. Please don't hesitate to contact me if I can be of assistance to you.   Following are the goals we discussed today:   Goals Addressed             This Visit's Progress    Connect with [employer] to address transporation and housing concern       Activities and task to complete in order to accomplish goals.   Call or go to Department of Social Services to inquire about [employer]; phone number provided Follow up with housing resources discussed (Call [employer] for list of sec. 8 apartments ) Complete SCAT application Ask your granddaughter to assist you          Our next appointment is by telephone on 02/18/23 at 11:30  Please call the care guide team at [phone] if you need to cancel or reschedule your appointment.   The patient verbalized understanding of instructions, educational materials, and care plan provided today and DECLINED offer to receive copy of patient instructions, educational materials, and care plan.   Casimer Lanius, [place] [phone]

## 2023-01-31 DIAGNOSIS — R0602 Shortness of breath: Secondary | ICD-10-CM | POA: Diagnosis not present

## 2023-01-31 DIAGNOSIS — J45998 Other asthma: Secondary | ICD-10-CM | POA: Diagnosis not present

## 2023-02-03 ENCOUNTER — Encounter: Payer: Self-pay | Admitting: Internal Medicine

## 2023-02-18 ENCOUNTER — Ambulatory Visit: Payer: Self-pay | Admitting: Licensed Clinical Social Worker

## 2023-02-18 NOTE — Patient Instructions (Signed)
Visit Information  Thank you for taking time to visit with me today. Please don't hesitate to contact me if I can be of assistance to you.   Following are the goals we discussed today:   Goals Addressed             This Visit's Progress    COMPLETED: Connect with [employer] to address transporation and housing concern       Activities and task to complete in order to accomplish goals.   Congratulations on getting Medicaid Transportation set up Continue to review the housing resources you received from [employer]  Per your request I have mailed you a list of assisted living options  Complete SCAT application Ask your granddaughter to assist you         Please call the care guide team at [phone] if you need to cancel or reschedule your appointment.    The patient verbalized understanding of instructions, educational materials, and care plan provided today and DECLINED offer to receive copy of patient instructions, educational materials, and care plan.   No further follow up required: by Care Coordination at this time  Casimer Lanius, [place] [phone]

## 2023-02-18 NOTE — Patient Outreach (Signed)
  Care Coordination  Follow Up Visit Note   02/18/2023 Name: PHYLISS SWEELEY MRN: [id number] DOB: June 14, 1941  NYAJAH MINICHIELLO is a 82 y.o. year old female who sees Hoyt Koch, MD for primary care. I spoke with  Franchot Mimes by phone today.  What matters to the patients health and wellness today?    Patient continues to be delayed with completing SCAT application and locating new housing, she is waiting for her family to assist.  Patient has all needed information, SCAT application and list of housing options from [employer].  Patient would also like to explore Assisted Living options.    Goals Addressed             This Visit's Progress    COMPLETED: Connect with [employer] to address transporation and housing concern       Activities and task to complete in order to accomplish goals.   Congratulations on getting Medicaid Transportation set up Continue to review the housing resources you received from [employer]  Per your request I have mailed you a list of assisted living options  Complete SCAT application Ask your granddaughter to assist you        SDOH assessments and interventions completed:  No  Care Coordination Interventions:  Yes, provided  Interventions Today    Flowsheet Row Most Recent Value  Chronic Disease   Chronic disease during today's visit Hypertension (HTN)  General Interventions   General Interventions Discussed/Reviewed General Interventions Reviewed, [employer], Level of Care  [assessed for barriers with locating housing and transportation]  Level of [place], Assisted Living  [reviewed options with patient mailed list for assisted living]       Follow up plan: No further intervention required. Patient states will call if needed.  Encounter Outcome:  Pt. Visit Completed   Casimer Lanius, [place] [phone]

## 2023-03-03 ENCOUNTER — Other Ambulatory Visit: Payer: Self-pay | Admitting: Internal Medicine

## 2023-03-03 DIAGNOSIS — J45998 Other asthma: Secondary | ICD-10-CM | POA: Diagnosis not present

## 2023-03-03 DIAGNOSIS — R0602 Shortness of breath: Secondary | ICD-10-CM | POA: Diagnosis not present

## 2023-03-04 ENCOUNTER — Other Ambulatory Visit: Payer: Self-pay | Admitting: Internal Medicine

## 2023-03-04 DIAGNOSIS — J45901 Unspecified asthma with (acute) exacerbation: Secondary | ICD-10-CM

## 2023-03-17 ENCOUNTER — Telehealth: Payer: Self-pay

## 2023-03-17 DIAGNOSIS — M159 Polyosteoarthritis, unspecified: Secondary | ICD-10-CM

## 2023-03-17 NOTE — Telephone Encounter (Signed)
Done

## 2023-03-17 NOTE — Telephone Encounter (Signed)
Pt has asked for an order for the [employer] Transport Rollator walker where she can use it to assist her with walking and if needed be she can be rolled while sitting.  ** Pt states the walker she did have she had to leave in her lobby. Upon returning from her apptmnt someone had taken her walker and it is no where to be found.  Please contact pt with an update at [phone]

## 2023-03-17 NOTE — Telephone Encounter (Signed)
Order placed in epic okay to send to [employer].

## 2023-03-22 ENCOUNTER — Encounter: Payer: Self-pay | Admitting: Podiatry

## 2023-03-22 ENCOUNTER — Ambulatory Visit (INDEPENDENT_AMBULATORY_CARE_PROVIDER_SITE_OTHER): Payer: 59 | Admitting: Podiatry

## 2023-03-22 DIAGNOSIS — B351 Tinea unguium: Secondary | ICD-10-CM | POA: Diagnosis not present

## 2023-03-22 DIAGNOSIS — M79676 Pain in unspecified toe(s): Secondary | ICD-10-CM | POA: Diagnosis not present

## 2023-03-23 NOTE — Progress Notes (Signed)
   Chief Complaint  Patient presents with   Debridement    Trim toenails/calluses    SUBJECTIVE Patient presents to office today complaining of elongated, thickened nails that cause pain while ambulating in shoes.  Patient is unable to trim their own nails. Patient is here for further evaluation and treatment.  Past Medical History:  Diagnosis Date   Arthritis    hands and knees. May be hips   Gout    great toe, ankles   Hyperlipidemia    Hypertension    Migraines    Nocturnal dyspnea    ONO 2010 with several readings  below 88%   Obesity, Class II, BMI 35-39.9    OSA (obstructive sleep apnea)    mild by study Dec '09. AHI 5.7   Plantar fasciitis    Varicella     No Known Allergies   OBJECTIVE General Patient is awake, alert, and oriented x 3 and in no acute distress. Derm Skin is dry and supple bilateral. Negative open lesions or macerations. Remaining integument unremarkable. Nails are tender, long, thickened and dystrophic with subungual debris, consistent with onychomycosis, 1-5 bilateral. No signs of infection noted. Vasc  DP and PT pedal pulses palpable bilaterally. Temperature gradient within normal limits.  Neuro Epicritic and protective threshold sensation grossly intact bilaterally.  Musculoskeletal Exam No symptomatic pedal deformities noted bilateral. Muscular strength within normal limits.  ASSESSMENT 1.  Pain due to onychomycosis of toenails both  PLAN OF CARE 1. Patient evaluated today.  2. Instructed to maintain good pedal hygiene and foot care.  3. Mechanical debridement of nails 1-5 bilaterally performed using a nail nipper. Filed with dremel without incident.  4. Return to clinic in 3 mos.    Felecia Shelling, DPM [facility]  Dr. Felecia Shelling, DPM    [address] [place], [place] [address]                Office [phone]  Fax [phone]

## 2023-03-24 DIAGNOSIS — M170 Bilateral primary osteoarthritis of knee: Secondary | ICD-10-CM | POA: Diagnosis not present

## 2023-03-25 NOTE — Telephone Encounter (Signed)
Patient would like to know when she can get the walker. She would like a call back at [phone].

## 2023-03-31 NOTE — Telephone Encounter (Signed)
Please call patient - she has still not gotten her walker.  Patient's number:  [phone]

## 2023-04-02 ENCOUNTER — Encounter: Payer: Self-pay | Admitting: Licensed Clinical Social Worker

## 2023-04-02 DIAGNOSIS — J45998 Other asthma: Secondary | ICD-10-CM | POA: Diagnosis not present

## 2023-04-02 DIAGNOSIS — R0602 Shortness of breath: Secondary | ICD-10-CM | POA: Diagnosis not present

## 2023-04-02 NOTE — Telephone Encounter (Signed)
Social Worker is working on this for the patient currently. Will reach out to her once she get this resolved.

## 2023-04-02 NOTE — Patient Instructions (Signed)
  Patient was not contacted during this encounter.  LCSW collaborated with care team to accomplish patient's care plan goal   Blandina Renaldo, LCSW Social Work Care Coordination  [phone]  

## 2023-04-02 NOTE — Patient Outreach (Signed)
  Care Coordination   Documentation Note   04/02/2023 Name: Heather Grant MRN: [id number] DOB: December 16, 1940  Heather Grant is a 82 y.o. year old female who sees Myrlene Broker, MD for primary care. I  did not contact patient during this encounter  What matters to the patients health and wellness today?    Conducted brief assessment, recommendations and relevant information discussed.   LCSW  collaborated with CMA and [employer]  to assist with meeting patient's needs.  .  Assist with removing barriers with getting Due Transport Rollator  SDOH assessments and interventions completed:  No   Care Coordination Interventions:  Yes, provided  Interventions Today    Flowsheet Row Most Recent Value  Chronic Disease   Chronic disease during today's visit Hypertension (HTN)  General Interventions   General Interventions Discussed/Reviewed General Interventions Discussed, Durable Medical Equipment (DME), Communication with  Durable Medical Equipment (DME) Walker  Communication with PCP/Specialists  [CMA at office and [employer] Representative]       Follow up plan:  Will f/u with patient once able to provide her with information.  Waiting on return follow up from [employer].    Encounter Outcome:  Pt. Visit Completed

## 2023-04-06 ENCOUNTER — Ambulatory Visit: Payer: Self-pay | Admitting: Licensed Clinical Social Worker

## 2023-04-06 NOTE — Patient Outreach (Signed)
  Care Coordination  Follow Up Visit Note   04/06/2023 Name: Heather Grant MRN: [id number] DOB: 30-Nov-1941  Heather Grant is a 82 y.o. year old female who sees Myrlene Broker, MD for primary care. I spoke with  [name] by phone today.  What matters to the patients health and wellness today?  Getting a rolling walker and addressing other unmet needs.  Patient's walker was taken and she is having difficulty walking without it.  Last walker cover by insurance provider was 10/20/2019 per [employer].  They do not have the rolling walker.  LCSW made called to medical supply stores this item is not covered by insurance,  Home Health agencies don't carry them and patient is not eligible for a insurance to cover the cost until 2025.  Referral also placed for meals on wheels.    Goals Addressed             This Visit's Progress    Connect with Community Resources to address concerns       Activities and task to complete in order to accomplish goals.   Congratulations on getting Medicaid Transportation set up, you are also using [facility] for medical transportation  Continue to review the housing resources you received from [employer]  Per your request I have mailed you a list of assisted living options  We will discuss the  SCAT application, your granddaughter has not been able to assist you  Call [facility] to see if there are any options with getting a rollator We will also discuss more about Personal Care Services for you at the next encounter       SDOH assessments and interventions completed:  Yes  SDOH Interventions Today    Flowsheet Row Most Recent Value  SDOH Interventions   Food Insecurity Interventions [id number] Referral  [Meals on wheels referral placed 04/06/23]       Care Coordination Interventions:  Yes, provided  Interventions Today    Flowsheet Row Most Recent Value  Chronic Disease   Chronic disease during today's visit  Hypertension (HTN)  General Interventions   General Interventions Discussed/Reviewed General Interventions Discussed, Level of Care, Durable Medical Equipment (DME)  [needs help with SCAT application ( problem solving and solution focused provided)]  Durable Medical Equipment (DME) Wilfrid Lund with equipment]  Communication with --  [Medical supply stores and HH agencies]  Level of Care Personal Care Services  [discussed will discuss further at next encounter]  Education Interventions   Education Provided Provided Education  Provided Verbal Education On Insurance Plans  Nutrition Interventions   Nutrition Discussed/Reviewed Nutrition Reviewed       Follow up plan: Follow up call scheduled for 04/07/23    Encounter Outcome:  Pt. Visit Completed   [name], LCSW Social Work Care Coordination  [facility] [name] [employer] [phone]

## 2023-04-06 NOTE — Patient Instructions (Signed)
Visit Information  Thank you for taking time to visit with me today. Please don't hesitate to contact me if I can be of assistance to you.   Following are the goals we discussed today:   Goals Addressed             This Visit's Progress    Connect with [employer] to address concerns       Activities and task to complete in order to accomplish goals.   Congratulations on getting Medicaid Transportation set up, you are also using [facility] for medical transportation  Continue to review the housing resources you received from [employer]  Per your request I have mailed you a list of assisted living options  We will discuss the  SCAT application, your granddaughter has not been able to assist you  Call [facility] to see if there are any options with getting a rollator We will also discuss more about Personal Care Services for you at the next encounter        Our next appointment is by telephone on 04/07/23   Please call the care guide team at [phone] if you need to cancel or reschedule your appointment.    The patient verbalized understanding of instructions, educational materials, and care plan provided today and DECLINED offer to receive copy of patient instructions, educational materials, and care plan.   [name], LCSW Social Work Care Coordination  [name] [employer] [phone]

## 2023-04-07 ENCOUNTER — Ambulatory Visit: Payer: 59 | Admitting: Licensed Clinical Social Worker

## 2023-04-07 NOTE — Patient Outreach (Signed)
  Care Coordination  Follow Up Visit Note   04/07/2023 Name: Heather Grant MRN: [id number] DOB: 08/12/1941  Heather Grant is a 82 y.o. year old female who sees Heather Broker, MD for primary care. I spoke with  Heather Grant by phone today.  What matters to the patients health and wellness today?  Getting a rolling walker    Goals Addressed             This Visit's Progress    Connect with Community Resources to address concerns       Activities and task to complete in order to accomplish goals.   Congratulations on getting Medicaid Transportation set up, you are also using [facility] for medical transportation  Continue to review the housing resources you received from Heather Grant  Per your request I have mailed you a list of assisted living options  We will discuss the  SCAT application, your granddaughter has not been able to assist you  We will also discuss more about Personal Care Services for you at the next encounter We have contacted [employer] at the recommendation of your insurance navigator.  Per [facility] this is what they need. Fax Number  is [phone] 1. Rx from provider 2. Clinical notes from provider indicating why this is needed 3. Demographic Sheet as well as insurance information  Information has been shared with Dr. Okey Dupre.         SDOH assessments and interventions completed:  No   Care Coordination Interventions:  Yes, provided  Interventions Today    Flowsheet Row Most Recent Value  Chronic Disease   Chronic disease during today's visit Hypertension (HTN)  General Interventions   General Interventions Discussed/Reviewed Durable Medical Equipment (DME), Communication with  Durable Medical Equipment (DME) Dan Humphreys  [Duo transport rollator]  Communication with PCP/Specialists  [name] agencies [phone] and Christoper Allegra [phone]]       Follow up plan: Follow up call scheduled for 2 weeks.  LCSW will  continue to collaborate with PCP    Encounter Outcome:  Pt. Visit Completed   [name], LCSW Social Work Care Coordination  [name] [employer] [phone]

## 2023-04-07 NOTE — Patient Instructions (Signed)
Visit Information  Thank you for taking time to visit with me today. Please don't hesitate to contact me if I can be of assistance to you.   Following are the goals we discussed today:   Goals Addressed             This Visit's Progress    Connect with [employer] to address concerns       Activities and task to complete in order to accomplish goals.   Congratulations on getting Medicaid Transportation set up, you are also using [facility] for medical transportation  Continue to review the housing resources you received from [employer]  Per your request I have mailed you a list of assisted living options  We will discuss the  SCAT application, your granddaughter has not been able to assist you  We will also discuss more about Personal Care Services for you at the next encounter We have contacted [employer] at the recommendation of your insurance navigator.  Per [facility] this is what they need. Fax Number  is [phone] 1. Rx from provider 2. Clinical notes from provider indicating why this is needed 3. Demographic Sheet as well as insurance information  Information has been shared with Dr. Okey Dupre.          Our next appointment is by telephone on 04/21/23 at 1:15  Please call the care guide team at [phone] if you need to cancel or reschedule your appointment.    The patient verbalized understanding of instructions, educational materials, and care plan provided today and DECLINED offer to receive copy of patient instructions, educational materials, and care plan.   [name], LCSW Social Work Care Coordination  [name] [employer] [phone]

## 2023-04-12 ENCOUNTER — Ambulatory Visit: Payer: Self-pay | Admitting: Licensed Clinical Social Worker

## 2023-04-12 NOTE — Patient Outreach (Signed)
  Care Coordination  Follow Up Visit Note   04/12/2023 Name: Heather Grant MRN: [id number] DOB: [birth year]  Heather Grant is a 82 y.o. year old female who sees Myrlene Broker, MD for primary care. I spoke with  Heather Grant by phone today.  What matters to the patients health and wellness today?  Getting her rolling walker    Goals Addressed             This Visit's Progress    Connect with Community Resources to address concerns       Activities and task to complete in order to accomplish goals.   Congratulations on getting Medicaid Transportation set up, you are also using [facility] for medical transportation  Continue to review the housing resources you received from [employer]  Per your request I have mailed you a list of assisted living options  We will discuss the  SCAT application, your granddaughter has not been able to assist you  We will also discuss more about Personal Care Services for you at the next encounter We have contacted [employer] at the recommendation of your insurance navigator.  Per [facility] this is what they need. Fax Number  is [phone] 1. Rx from provider 2. Clinical notes from provider indicating why this is needed 3. Demographic Sheet as well as insurance information  Keep appointment with Dr. Okey Dupre on may 17th at 2:20 so that we can get the required information to [employer]       SDOH assessments and interventions completed:  No   Care Coordination Interventions:  Yes, provided  Interventions Today    Flowsheet Row Most Recent Value  Chronic Disease   Chronic disease during today's visit Hypertension (HTN)  General Interventions   General Interventions Discussed/Reviewed General Interventions Reviewed, Durable Medical Equipment (DME)  Barbaraann Cao barriers with DME]  Durable Medical Equipment (DME) Dan Humphreys  Communication with --  [assisted with scheduling office appointment with PCP]       Follow up  plan: Follow up call scheduled for 04/21/23    Encounter Outcome:  Pt. Visit Completed   [name], LCSW Social Work Care Coordination  [name] [employer] [phone]

## 2023-04-12 NOTE — Patient Instructions (Signed)
Visit Information  Thank you for taking time to visit with me today. Please don't hesitate to contact me if I can be of assistance to you.   Following are the goals we discussed today:   Goals Addressed             This Visit's Progress    Connect with [facility] to address concerns       Activities and task to complete in order to accomplish goals.   Congratulations on getting Medicaid Transportation set up, you are also using [facility] for medical transportation  Continue to review the housing resources you received from [employer]  Per your request I have mailed you a list of assisted living options  We will discuss the  SCAT application, your granddaughter has not been able to assist you  We will also discuss more about Personal Care Services for you at the next encounter We have contacted [employer] at the recommendation of your insurance navigator.  Per [facility] this is what they need. Fax Number  is [phone] 1. Rx from provider 2. Clinical notes from provider indicating why this is needed 3. Demographic Sheet as well as insurance information  Keep appointment with Dr. Okey Dupre on may 17th at 2:20 so that we can get the required information to [facility]        Our next appointment is by telephone on 04/21/23   Please call the care guide team at [phone] if you need to cancel or reschedule your appointment.    The patient verbalized understanding of instructions, educational materials, and care plan provided today and DECLINED offer to receive copy of patient instructions, educational materials, and care plan.   [name], LCSW Social Work Care Coordination  [name] [employer] [phone]

## 2023-04-16 ENCOUNTER — Ambulatory Visit: Payer: 59 | Admitting: Internal Medicine

## 2023-04-21 ENCOUNTER — Encounter: Payer: Self-pay | Admitting: Internal Medicine

## 2023-04-21 ENCOUNTER — Ambulatory Visit: Payer: 59 | Admitting: Internal Medicine

## 2023-04-21 ENCOUNTER — Ambulatory Visit (INDEPENDENT_AMBULATORY_CARE_PROVIDER_SITE_OTHER): Payer: 59 | Admitting: Internal Medicine

## 2023-04-21 ENCOUNTER — Ambulatory Visit: Payer: Self-pay | Admitting: Licensed Clinical Social Worker

## 2023-04-21 VITALS — BP 126/80 | HR 55 | Temp 98.2°F | Ht 59.0 in | Wt 177.0 lb

## 2023-04-21 DIAGNOSIS — R202 Paresthesia of skin: Secondary | ICD-10-CM | POA: Diagnosis not present

## 2023-04-21 DIAGNOSIS — R2689 Other abnormalities of gait and mobility: Secondary | ICD-10-CM | POA: Diagnosis not present

## 2023-04-21 DIAGNOSIS — I10 Essential (primary) hypertension: Secondary | ICD-10-CM

## 2023-04-21 DIAGNOSIS — M170 Bilateral primary osteoarthritis of knee: Secondary | ICD-10-CM | POA: Diagnosis not present

## 2023-04-21 DIAGNOSIS — G8929 Other chronic pain: Secondary | ICD-10-CM | POA: Diagnosis not present

## 2023-04-21 DIAGNOSIS — M549 Dorsalgia, unspecified: Secondary | ICD-10-CM

## 2023-04-21 DIAGNOSIS — Z9181 History of falling: Secondary | ICD-10-CM | POA: Diagnosis not present

## 2023-04-21 NOTE — Patient Instructions (Signed)
    A rollator was ordered.  This should be delivered to you.

## 2023-04-21 NOTE — Patient Outreach (Signed)
  Care Coordination  Follow Up Visit Note   04/21/2023 Name: Heather Grant MRN: [id number] DOB: 1941/04/27  Heather Grant is a 82 y.o. year old female who sees Heather Broker, MD for primary care. I spoke with  Heather Grant by phone today.  What matters to the patients health and wellness today?  Getting appointment with PCP so that she can get her walker.   Patient continues to experience barriers with [facility] Medicare transportation. Transportation caused her to miss her appointment with PCP. Reports she really needs a walker.  Appointment with PCP for face to face for walker.  LCSW will continue to assist with removing barriers with patient getting walker.   Goals Addressed             This Visit's Progress    Connect with Community Resources to address concerns       Activities and task to complete in order to accomplish goals.   Congratulations on getting Medicaid Transportation set up, you are also using [facility] for medical transportation  Continue to review the housing resources you received from [employer]  Per your request I have mailed you a list of assisted living options  We will discuss the  SCAT application, your granddaughter has not been able to assist you  We will also discuss more about Personal Care Services for you at the next encounter We have contacted [employer] at the recommendation of your insurance navigator.   Per [facility] this is what they need. Fax Number  is [phone] 1. Rx from provider 2. Clinical notes from provider indicating why this is needed 3. Demographic Sheet as well as insurance information         SDOH assessments and interventions completed:  No  Care Coordination Interventions:  Yes, provided  Interventions Today    Flowsheet Row Most Recent Value  Chronic Disease   Chronic disease during today's visit Hypertension (HTN)  General Interventions   General Interventions Discussed/Reviewed  Durable Medical Equipment (DME), Communication with  Durable Medical Equipment (DME) Walker  Communication with PCP/Specialists  [CMA in provider office]  Applications Other  [SCAT]  Education Interventions   Provided Verbal Education On Public librarian Other  [SCAT]  Mental Health Interventions   Mental Health Discussed/Reviewed Coping Strategies  [relaxed breathing and solution focused]       Follow up plan:  will f/u with patient in one week and continue to collaborate with provider on patient's needs    Encounter Outcome:  Pt. Visit Completed   [name], LCSW Social Work Care Coordination  [facility] [employer] [phone]

## 2023-04-21 NOTE — Progress Notes (Signed)
Subjective:    Patient ID: Heather Grant, female    DOB: 1941/06/23, 82 y.o.   MRN: [id number]      HPI Heather Grant is here for  Chief Complaint  Patient presents with   DME    Wants to discuss obtaining a rollator    She uses a cane or walker (her walker was stolen, which was a rollator) to ambulate.  She has bilateral knee osteoarthritis and sees orthopedics and is currently getting injections.  She has a house tingling in her feet, poor balance, chronic back pain.  All of these things limit her ability to ambulate.  She has to use a cane or walker to prevent falls.  She has had 1 fall.  She is not able to walk long distances-gets short of breath and very tired.  She needs a walker with a seat to be able to sit when she does get tired.      Medications and allergies reviewed with patient and updated if appropriate.  Current Outpatient Medications on File Prior to Visit  Medication Sig Dispense Refill   albuterol (VENTOLIN HFA) 108 (90 Base) MCG/ACT inhaler USE 1 TO 2 INHALATIONS BY MOUTH  EVERY 6 HOURS AS NEEDED FOR  WHEEZING OR SHORTNESS OF BREATH 34 g 2   Cholecalciferol 50 MCG (2000 UT) CAPS Vitamin D3 50 mcg (2,000 unit) capsule  Take 1 capsule every day by oral route as directed.     colchicine 0.6 MG tablet TAKE 1 TABLET BY MOUTH DAILY FOR GOUT FLARE 100 tablet 2   COVID-19 mRNA vaccine 2023-2024 (COMIRNATY) syringe Inject into the muscle. 0.3 mL 0   DULERA 100-5 MCG/ACT AERO USE 2 INHALATIONS BY MOUTH TWICE DAILY 39 g 3   famotidine (PEPCID) 20 MG tablet TAKE 1 TABLET BY MOUTH DAILY AT  BEDTIME 100 tablet 2   hydrALAZINE (APRESOLINE) 25 MG tablet TAKE 1 TABLET BY MOUTH 3 TIMES  DAILY 270 tablet 1   LUMIGAN 0.01 % SOLN 1 drop at bedtime.     meloxicam (MOBIC) 15 MG tablet TAKE 1 TABLET BY MOUTH DAILY 100 tablet 2   metoprolol tartrate (LOPRESSOR) 25 MG tablet TAKE 1 TABLET BY MOUTH TWICE  DAILY 200 tablet 2   nystatin cream (MYCOSTATIN) APPLY TO AFFECTED AREA  TWICE A DAY     OXYGEN Inhale 2 L into the lungs at bedtime.     pantoprazole (PROTONIX) 40 MG tablet TAKE 1 TABLET BY MOUTH DAILY 30  TO 60 MINUTES BEFORE FIRST MEAL  OF THE DAY 100 tablet 2   Polyvinyl Alcohol (LUBRICANT DROPS OP) Place 1 drop into both eyes daily.     rosuvastatin (CRESTOR) 20 MG tablet TAKE 1 TABLET BY MOUTH DAILY 100 tablet 2   VOLTAREN 1 % GEL Apply 2 g topically daily as needed (knee pain).      Aspirin-Salicylamide-Caffeine (BC HEADACHE POWDER PO) Take 1 packet by mouth daily as needed (knee pain). (Patient not taking: Reported on 04/21/2023)     No current facility-administered medications on file prior to visit.    Review of Systems  Gastrointestinal:  Positive for constipation.  Musculoskeletal:  Positive for arthralgias (b/l knee, feet and ankle pain). Back pain: it gives out on her.      Has hammertoe that hurts       Objective:   Vitals:   04/21/23 1416  BP: 126/80  Pulse: (!) 55  Temp: 98.2 F (36.8 C)  SpO2: 99%   BP Readings  from Last 3 Encounters:  04/21/23 126/80  10/16/22 124/70  02/04/22 126/76   Wt Readings from Last 3 Encounters:  04/21/23 177 lb (80.3 kg)  01/18/23 175 lb (79.4 kg)  10/16/22 176 lb (79.8 kg)   Body mass index is 35.75 kg/m.    Physical Exam Constitutional:      General: She is not in acute distress.    Appearance: Normal appearance. She is not ill-appearing.  HENT:     Head: Normocephalic and atraumatic.  Skin:    General: Skin is warm and dry.     Findings: No erythema or rash.  Neurological:     Mental Status: She is alert.     Sensory: No sensory deficit.     Motor: No weakness.     Gait: Gait abnormal (Difficulty walking-currently using a cane).            Assessment & Plan:    Bilateral knee osteoarthritis, chronic back pain, tingling in feet, poor balance, history of fall:  Chronic Because of the above problems she requires a cane or walker to ambulate She is not able to walk long  distances because of getting short of breath, fatigued and her legs giving out on her therefore it is medically necessary for her to have a rollator with a seat so that she is able to sit if needed She is able to use a cane for short distances which she will continue to use, but the rollator is also necessary Will prescribe a rollator Continue to follow-up with orthopedics Fall prevention  Hypertension: Chronic Blood pressure well-controlled here Continue hydralazine 25 mg 3 times daily, metoprolol 25 mg twice daily

## 2023-04-21 NOTE — Patient Instructions (Signed)
Social Work Visit Information  Thank you for taking time to visit with me today. Please don't hesitate to contact me if I can be of assistance to you.   Following are the goals we discussed today:   Goals Addressed             This Visit's Progress    Connect with [facility] to address concerns       Activities and task to complete in order to accomplish goals.   Congratulations on getting Medicaid Transportation set up, you are also using [facility] for medical transportation  Continue to review the housing resources you received from [employer]  Per your request I have mailed you a list of assisted living options  We will work on your SCAT application, I e-mailed part B on 04/21/23 to Dr. Okey Dupre to complete  We will also discuss more about Personal Care Services for you at the next encounter We have contacted [employer] at the recommendation of your insurance navigator.   Per [facility] this is what they need. Fax Number  is [phone] 1. Rx from provider 2. Clinical notes from provider indicating why this is needed 3. Demographic Sheet as well as insurance information          No follow up scheduled with social work at this time. Will follow up in 1 week .Patient will call office if needed prior to next encounter.  Please call the care guide team at [phone] if you need to cancel or reschedule your appointment.   If you or anyone you know are experiencing a Mental Health or Behavioral Health Crisis or need someone to talk to, please call the Suicide and Crisis Lifeline: 988 call the [place] National Suicide Prevention Lifeline: 269-565-4549 or TTY: 504 111 6108 TTY (402) 079-1105) to talk to a trained counselor call 1-800-273-TALK (toll free, 24 hour hotline) go to [facility] [address] [place] [phone])   The patient verbalized understanding of instructions, educational materials, and care plan  provided today and DECLINED offer to receive copy of patient instructions, educational materials, and care plan.    [name], LCSW Social Work Care Coordination  [name] [employer] [phone]

## 2023-04-22 ENCOUNTER — Telehealth: Payer: Self-pay | Admitting: *Deleted

## 2023-04-22 NOTE — Telephone Encounter (Signed)
Pt came in saw Dr. Lawerance Bach concerning walker. Closing encounter.Heather KitchenRaechel Chute

## 2023-04-22 NOTE — Telephone Encounter (Signed)
-----   Message from Soundra Pilon, LCSW sent at 04/12/2023 11:47 AM EDT ----- Regarding: RE: Request for Duo transport rollator Dr. Okey Dupre  Ms. Cunnington is scheduled to see you on Friday May 17th at 2:20  Please let me know if my assistance is needed to get the required information to [place].  If not [place] Please teams me when it is faxed and I will follow up to make sure things are progressing.  Per [facility] this is what they need Fax Number to them below  Fax Number [phone]  1. Rx from provider 2. Clinical notes from provider indicating why this is needed 3. Demographic Sheet as well as insurance information   ----- Message ----- From: Myrlene Broker, MD Sent: 04/08/2023   7:28 AM EDT To: Soundra Pilon, LCSW Subject: RE: Request for Duo transport rollator         Ok to schedule visit, can do video if she has access to a video device (does not need mychart just needs capability to do audio/video visit) or in person if no access.  ----- Message ----- From: Christian Mate Sent: 04/07/2023   2:49 PM EDT To: Myrlene Broker, MD; Levonne Lapping, CMA Subject: Request for Duo transport rollator              Hello Dr. Okey Dupre,  I have spent time trying to find solutions for this patient.  I have contacted [employer] at the recommendation of patient's insurance navigator.  Per [facility] this is what they need Fax Number  is [phone]  1. Rx from provider 2. Clinical notes from provider indicating why this is needed 3. Demographic Sheet as well as insurance information  I don't see a recent office visit with you Dr. Okey Dupre so she may need a face to face office visit at she does not use MyChart.   Please advise if this is correct and I will call her to get it scheduled.  [name], LCSW Social Work Care Coordination  [name] [employer] [phone]

## 2023-04-23 ENCOUNTER — Ambulatory Visit: Payer: 59 | Admitting: Licensed Clinical Social Worker

## 2023-04-23 NOTE — Patient Outreach (Signed)
  Care Coordination  Follow Up Visit Note   04/23/2023 Name: DONNAE DONNA MRN: [id number] DOB: 07/28/1941  DHRUTI LASKER is a 82 y.o. year old female who sees Myrlene Broker, MD for primary care. I spoke with  [name] by phone today.  What matters to the patients health and wellness today?  Getting her walker  Patient continues to experience barriers with getting her walker.  Patient is also experiencing stress related to her housing.  Is waiting for return call from her Housing Child psychotherapist. LCSW will continue to collaborate with [facility] Medicare Navigator and [employer]   Goals Addressed             This Visit's Progress    Connect with Community Resources to address concerns       Activities and task to complete in order to accomplish goals.   Congratulations on getting Medicaid Transportation set up, you are also using [facility] for medical transportation  Continue to review the housing resources you received from [employer]  Per your request I have mailed you a list of assisted living options  We will work on your SCAT application, I e-mailed part B on 04/21/23 to Dr. Okey Dupre to complete  We will also discuss more about Personal Care Services for you at the next encounter Follow up with your health insurance navigator to discuss the barriers with your walker [facility]  Fax Number  is [phone]          SDOH assessments and interventions completed:  No   Care Coordination Interventions:  Yes, provided  Interventions Today    Flowsheet Row Most Recent Value  Chronic Disease   Chronic disease during today's visit Hypertension (HTN)  General Interventions   General Interventions Discussed/Reviewed Durable Medical Equipment (DME)  Durable Medical Equipment (DME) Dan Humphreys  [discussed barriers]  Mental Health Interventions   Mental Health Discussed/Reviewed Coping Strategies, Other  [solution focused, problem solving, task  centered]  Safety Interventions   Safety Discussed/Reviewed Safety Reviewed  Home Safety Assistive Devices       Follow up plan:  will  f/u with patient in 1 to 2 weeks    Encounter Outcome:  Pt. Visit Completed   [name], LCSW Social Work Care Coordination  [name] [employer] [phone]

## 2023-04-23 NOTE — Patient Instructions (Signed)
Social Work Visit Information  Thank you for taking time to visit with me today. Please don't hesitate to contact me if I can be of assistance to you.   Following are the goals we discussed today:   Goals Addressed             This Visit's Progress    Connect with [employer] to address concerns       Activities and task to complete in order to accomplish goals.   Congratulations on getting Medicaid Transportation set up, you are also using [facility] for medical transportation  Continue to review the housing resources you received from [employer]  Per your request I have mailed you a list of assisted living options  We will work on your SCAT application, I e-mailed part B on 04/21/23 to Dr. Okey Dupre to complete  We will also discuss more about Personal Care Services for you at the next encounter Follow up with your health insurance navigator to discuss the barriers with your walker [facility]  Fax Number  is [phone]           No follow up scheduled with social work at this time. Will follow up in 1 to 2 weeks .Patient will call office if needed prior to next encounter.  Please call the care guide team at [phone] if you need to cancel or reschedule your appointment.    The patient verbalized understanding of instructions, educational materials, and care plan provided today and DECLINED offer to receive copy of patient instructions, educational materials, and care plan.    [name], LCSW Social Work Care Coordination  [name] [employer] [phone]

## 2023-04-29 ENCOUNTER — Ambulatory Visit: Payer: Self-pay | Admitting: Licensed Clinical Social Worker

## 2023-04-29 NOTE — Patient Instructions (Signed)
Social Work Visit Information  Thank you for taking time to visit with me today. Please don't hesitate to contact me if I can be of assistance to you.   Following are the goals we discussed today:   Goals Addressed             This Visit's Progress    Connect with [employer] to address concerns       Activities and task to complete in order to accomplish goals.   Congratulations on getting Medicaid Transportation set up, you are also using [facility] for medical transportation  Keep housing appointment next week with  [employer]  We will work on Associate Professor, I e-mailed part B on 04/21/23 to Dr. Okey Dupre to complete  We will also discuss more about Personal Care Services for you at the next encounter Follow up with your health insurance navigator to discuss the barriers with your walker            No follow up scheduled with social work at this time. Will follow up in 1 to 2 weeks .Patient will call office if needed prior to next encounter.  Please call the care guide team at [phone] if you need to cancel or reschedule your appointment.    The patient verbalized understanding of instructions, educational materials, and care plan provided today and DECLINED offer to receive copy of patient instructions, educational materials, and care plan.     [name], LCSW Social Work Care Coordination  [name] [employer] [phone]

## 2023-04-29 NOTE — Patient Outreach (Signed)
  Care Coordination  Follow Up Visit Note   04/29/2023 Name: Heather Grant MRN: [id number] DOB: Aug 25, 1941  Heather Grant is a 82 y.o. year old female who sees Myrlene Broker, MD for primary care. I spoke with  [name] by phone today.  What matters to the patients health and wellness today?  Getting her walker  Patient continues to experience barriers with getting her walker.  [facility] Health Navigator is also assisting with barriers.  LCSW to collaborate to assist patient with getting walker.   Goals Addressed             This Visit's Progress    Connect with Community Resources to address concerns       Activities and task to complete in order to accomplish goals.   Congratulations on getting Medicaid Transportation set up, you are also using [facility] for medical transportation  Keep housing appointment next week with  [employer]  We will work on Associate Professor, I e-mailed part B on 04/21/23 to Dr. Okey Dupre to complete  We will also discuss more about Personal Care Services for you at the next encounter Follow up with your health insurance navigator to discuss the barriers with your walker           SDOH assessments and interventions completed:  No   Care Coordination Interventions:  Yes, provided  Interventions Today    Flowsheet Row Most Recent Value  Chronic Disease   Chronic disease during today's visit Hypertension (HTN)  General Interventions   General Interventions Discussed/Reviewed General Interventions Reviewed, Durable Medical Equipment (DME)  [solution focused and problem solving ref. barriers with DME]  Durable Medical Equipment (DME) Walker       Follow up plan:  will continue to f/u with patient on next 1 to 2 weeks and assist with removing barriers     Encounter Outcome:  Pt. Visit Completed   [name], LCSW Social Work Care Coordination  [name] [employer] [phone]

## 2023-05-03 DIAGNOSIS — R0602 Shortness of breath: Secondary | ICD-10-CM | POA: Diagnosis not present

## 2023-05-03 DIAGNOSIS — J45998 Other asthma: Secondary | ICD-10-CM | POA: Diagnosis not present

## 2023-05-04 ENCOUNTER — Ambulatory Visit: Payer: 59 | Admitting: Licensed Clinical Social Worker

## 2023-05-04 DIAGNOSIS — M170 Bilateral primary osteoarthritis of knee: Secondary | ICD-10-CM | POA: Diagnosis not present

## 2023-05-04 NOTE — Patient Instructions (Signed)
Social Work Visit Information  Thank you for taking time to visit with me today. Please don't hesitate to contact me if I can be of assistance to you.   Following are the goals we discussed today:   Goals Addressed             This Visit's Progress    Connect with [employer] to address concerns       Activities and task to complete in order to accomplish goals.   Congratulations on getting Medicaid Transportation set up, you are also using [facility] for medical transportation  Keep housing appointment tomorrow with  [employer]  We will work on Associate Professor, I e-mailed part B on 04/21/23 to Dr. Okey Dupre to complete  We will also discuss more about Personal Care Services for you at the next encounter Congratulations on getting your walker Follow up with your health insurance navigator to inquire about any additional cost with your walker            Our next appointment is by telephone on 05/11/23    Please call the care guide team at [phone] if you need to cancel or reschedule your appointment.    The patient verbalized understanding of instructions, educational materials, and care plan provided today and DECLINED offer to receive copy of patient instructions, educational materials, and care plan.     [name], LCSW Social Work Care Coordination  [facility] [name] [employer] [phone]

## 2023-05-04 NOTE — Patient Outreach (Signed)
  Care Coordination  Follow Up Visit Note   05/04/2023 Name: Heather Grant MRN: [id number] DOB: July 06, 1941  Heather Grant is a 82 y.o. year old female who sees Myrlene Broker, MD for primary care. I spoke with  [name] by phone today.  What matters to the patients health and wellness today?  Getting her walker  Incoming call from patient to report she is able to go pick up her walker today from [place]. States her co-pay is $17.00. Patient also continues to work with [employer] for her housing Chief Executive Officer.      Goals Addressed             This Visit's Progress    Connect with Community Resources to address concerns       Activities and task to complete in order to accomplish goals.   Congratulations on getting Medicaid Transportation set up, you are also using [facility] for medical transportation  Keep housing appointment tomorrow with  [employer]  We will work on Associate Professor, I e-mailed part B on 04/21/23 to Dr. Okey Dupre to complete  We will also discuss more about Personal Care Services for you at the next encounter Congratulations on getting your walker Follow up with your health insurance navigator to inquire about any additional cost with your walker           SDOH assessments and interventions completed:  No  Care Coordination Interventions:  Yes, provided  Interventions Today    Flowsheet Row Most Recent Value  Chronic Disease   Chronic disease during today's visit Hypertension (HTN)  General Interventions   General Interventions Discussed/Reviewed Durable Medical Equipment (DME), General Interventions Reviewed, Communication with  [solution focused , problem solving]  Durable Medical Equipment (DME) Walker  Communication with PCP/Specialists  [CMA in office refaxed order to [employer]]       Follow up plan: Follow up call scheduled for 05/11/23    Encounter Outcome:  Pt. Visit Completed    [name], LCSW Social Work Care Coordination  [name] [employer] [phone]

## 2023-05-11 ENCOUNTER — Ambulatory Visit: Payer: Self-pay | Admitting: Licensed Clinical Social Worker

## 2023-05-11 ENCOUNTER — Encounter: Payer: Self-pay | Admitting: Internal Medicine

## 2023-05-11 ENCOUNTER — Ambulatory Visit (INDEPENDENT_AMBULATORY_CARE_PROVIDER_SITE_OTHER): Payer: 59 | Admitting: Internal Medicine

## 2023-05-11 VITALS — BP 148/64 | HR 62 | Temp 97.7°F | Ht 59.0 in | Wt 182.0 lb

## 2023-05-11 DIAGNOSIS — M7989 Other specified soft tissue disorders: Secondary | ICD-10-CM

## 2023-05-11 DIAGNOSIS — Z6836 Body mass index (BMI) 36.0-36.9, adult: Secondary | ICD-10-CM

## 2023-05-11 LAB — COMPREHENSIVE METABOLIC PANEL
ALT: 14 U/L (ref 0–35)
AST: 19 U/L (ref 0–37)
Albumin: 4.2 g/dL (ref 3.5–5.2)
Alkaline Phosphatase: 52 U/L (ref 39–117)
BUN: 12 mg/dL (ref 6–23)
CO2: 31 mEq/L (ref 19–32)
Calcium: 10.1 mg/dL (ref 8.4–10.5)
Chloride: 107 mEq/L (ref 96–112)
Creatinine, Ser: 1.29 mg/dL — ABNORMAL HIGH (ref 0.40–1.20)
GFR: 39.27 mL/min — ABNORMAL LOW (ref >60.00)
Glucose, Bld: 83 mg/dL (ref 70–99)
Potassium: 4.2 mEq/L (ref 3.5–5.1)
Sodium: 143 mEq/L (ref 135–145)
Total Bilirubin: 0.7 mg/dL (ref 0.2–1.2)
Total Protein: 6.8 g/dL (ref 6.0–8.3)

## 2023-05-11 LAB — CBC
HCT: 42.1 % (ref 36.0–46.0)
Hemoglobin: 13.6 g/dL (ref 12.0–15.0)
MCHC: 32.3 g/dL (ref 30.0–36.0)
MCV: 96.2 fl (ref 78.0–100.0)
Platelets: 354 10*3/uL (ref 150.0–400.0)
RBC: 4.38 Mil/uL (ref 3.87–5.11)
RDW: 13.3 % (ref 11.5–15.5)
WBC: 7.3 10*3/uL (ref 4.0–10.5)

## 2023-05-11 LAB — VITAMIN D 25 HYDROXY (VIT D DEFICIENCY, FRACTURES): VITD: 60.8 ng/mL (ref 30.00–100.00)

## 2023-05-11 LAB — TSH: TSH: 1 u[IU]/mL (ref 0.35–5.50)

## 2023-05-11 LAB — VITAMIN B12: Vitamin B-12: 390 pg/mL (ref 211–911)

## 2023-05-11 LAB — BRAIN NATRIURETIC PEPTIDE: Pro B Natriuretic peptide (BNP): 253 pg/mL — ABNORMAL HIGH (ref 0.0–100.0)

## 2023-05-11 NOTE — Patient Instructions (Signed)
We will check the labs. Work on fiber in [employer] and water to help with going to the bathroom.

## 2023-05-11 NOTE — Patient Outreach (Signed)
  Care Coordination  Follow Up Visit Note   05/11/2023 Name: Heather Grant MRN: [id number] DOB: 1941-02-11  Heather Grant is a 82 y.o. year old female who sees Myrlene Broker, MD for primary care. I spoke with  [name] by phone today.  What matters to the patients health and wellness today?  Getting reliable transportation to her medical appointments. Patient is excited to have her walker; next goal is to focus on getting [employer]AT for transportation needs.  Goals Addressed             This Visit's Progress    Connect with [employer] for support       Activities and task to complete in order to accomplish goals.   Please call to make sure you are set up with Medicaid Transportation  We completed your SCAT application today, I e-mailed the part to Dr. Okey Dupre to complete  We will also discuss more about Personal Care Services for you at the next encounter Congratulations on getting your walker            SDOH assessments and interventions completed:  Yes  SDOH Interventions Today    Flowsheet Row Most Recent Value  SDOH Interventions   Transportation Interventions SCAT (Specialized Community Area Transporation), Payor Benefit  [assisted her with SCAT application,  she will also call to connect with Medicaid Transporation]       Care Coordination Interventions:  Yes, provided  Interventions Today    Flowsheet Row Most Recent Value  Chronic Disease   Chronic disease during today's visit Hypertension (HTN)  General Interventions   General Interventions Discussed/Reviewed General Interventions Reviewed, Durable Medical Equipment (DME), Communication with  Durable Medical Equipment (DME) Dan Humphreys  [patient has walker]  Communication with --  [CMA at provider's office]  Applications Other  [SCAT application completed today]  Education Interventions   Education Provided Provided Education  Provided Verbal Education On [employer] Other  [SCAT application completed today]       Follow up plan: Follow up call scheduled for 2 weeks    Encounter Outcome:  Pt. Visit Completed   [name], LCSW Social Work Care Coordination  [name] [employer] [phone]

## 2023-05-11 NOTE — Progress Notes (Unsigned)
   Subjective:   Patient ID: Heather Grant, female    DOB: 03-22-1941, 82 y.o.   MRN: [id number]  HPI The patient is an 82 YO female coming in for swelling in the feet and some change in her bowels recently.   Review of Systems  Constitutional: Negative.   HENT: Negative.    Eyes: Negative.   Respiratory:  Negative for cough, chest tightness and shortness of breath.   Cardiovascular:  Positive for leg swelling. Negative for chest pain and palpitations.  Gastrointestinal:  Negative for abdominal distention, abdominal pain, constipation, diarrhea, nausea and vomiting.  Musculoskeletal: Negative.   Skin: Negative.   Neurological: Negative.   Psychiatric/Behavioral: Negative.      Objective:  Physical Exam Constitutional:      Appearance: She is well-developed. She is obese.  HENT:     Head: Normocephalic and atraumatic.  Cardiovascular:     Rate and Rhythm: Normal rate and regular rhythm.  Pulmonary:     Effort: Pulmonary effort is normal. No respiratory distress.     Breath sounds: Normal breath sounds. No wheezing or rales.  Abdominal:     General: Bowel sounds are normal. There is no distension.     Palpations: Abdomen is soft.     Tenderness: There is no abdominal tenderness. There is no rebound.  Musculoskeletal:        General: Tenderness present.     Cervical back: Normal range of motion.     Comments: 1+ edema to calves  Skin:    General: Skin is warm and dry.  Neurological:     Mental Status: She is alert and oriented to person, place, and time.     Coordination: Coordination normal.     Vitals:   05/11/23 1514 05/11/23 1517  BP: (!) 148/64 (!) 148/64  Pulse: 62   Temp: 97.7 F (36.5 C)   TempSrc: Oral   SpO2: 97%   Weight: 182 lb (82.6 kg)   Height: 4\' 11"  (1.499 m)     Assessment & Plan:

## 2023-05-11 NOTE — Patient Instructions (Signed)
Social Work Visit Information  Thank you for taking time to visit with me today. Please don't hesitate to contact me if I can be of assistance to you.   Following are the goals we discussed today:   Goals Addressed             This Visit's Progress    Connect with [employer] for support       Activities and task to complete in order to accomplish goals.   Please call to make sure you are set up with Medicaid Transportation  We completed your SCAT application today, I e-mailed the part to Dr. Okey Dupre to complete  We will also discuss more about Personal Care Services for you at the next encounter Congratulations on getting your walker             Our next appointment is by telephone on 05/27/23 at 10:00    Please call the care guide team at [phone] if you need to cancel or reschedule your appointment.   If you or anyone you know are experiencing a Mental Health or Behavioral Health Crisis or need someone to talk to, please call the Suicide and Crisis Lifeline: 988 call the [place] National Suicide Prevention Lifeline: (430) 654-3082 or TTY: (514)764-9649 TTY 256-740-3950) to talk to a trained counselor call 1-800-273-TALK (toll free, 24 hour hotline) go to [facility] [address], [place] [phone])   Patient verbalizes understanding of instructions and care plan provided today and agrees to view in MyChart. Active MyChart status and patient understanding of how to access instructions and care plan via MyChart confirmed with patient.       [name], LCSW Social Work Care Coordination  [name] [employer] [phone]

## 2023-05-13 DIAGNOSIS — M7989 Other specified soft tissue disorders: Secondary | ICD-10-CM | POA: Insufficient documentation

## 2023-05-13 NOTE — Assessment & Plan Note (Signed)
Suspect that this is contributing to her swelling in her feet and encouraged weight loss through diet and exercise.

## 2023-05-13 NOTE — Assessment & Plan Note (Addendum)
Checking CBC, CMP, BNP to assess cause. Suspect due to morbid obesity and increased salt intake.

## 2023-05-26 ENCOUNTER — Telehealth: Payer: Self-pay | Admitting: Internal Medicine

## 2023-05-26 NOTE — Telephone Encounter (Signed)
Paperwork received from [facility] & placed in provider's box up front.

## 2023-05-27 ENCOUNTER — Ambulatory Visit: Payer: Self-pay | Admitting: Licensed Clinical Social Worker

## 2023-05-27 NOTE — Telephone Encounter (Signed)
Placed inside providers office box

## 2023-05-27 NOTE — Patient Instructions (Signed)
Social Work Visit Information  Thank you for taking time to visit with me today. Please don't hesitate to contact me if I can be of assistance to you.   Following are the goals we discussed today:   Goals Addressed             This Visit's Progress    Connect with [employer] for support       Activities and task to complete in order to accomplish goals.   I am glad you were able to get the  [facility] Transportation set up please call them for transportation to your medical appointments  SCAT application is completed Dr. Frutoso Chase office information was faxed 05/18/23 by CMA . However form was returned from [facility],  office will update and resend  You have decided not to move forward with Personal Care Services at this time Congratulations on getting your walker We also discussed Advance Directives, per your request the documents are being mailed             Our next appointment is by telephone on 06/29/23 at 9:15   Please call the care guide team at [phone] if you need to cancel or reschedule your appointment.    The patient verbalized understanding of instructions, educational materials, and care plan provided today and DECLINED offer to receive copy of patient instructions, educational materials, and care plan.     [name], LCSW Social Work Care Coordination  [name] [employer] [phone]

## 2023-05-27 NOTE — Patient Outreach (Signed)
  Care Coordination  Follow Up Visit Note   05/27/2023 Name: Heather Grant MRN: [id number] DOB: [birth year]  Heather Grant is a 82 y.o. year old female who sees Myrlene Broker, MD for primary care. I spoke with  Heather Grant by phone today.  What matters to the patients health and wellness today?  Managing and understanding her health    Goals Addressed             This Visit's Progress    Connect with Community Resources for support       Activities and task to complete in order to accomplish goals.   I am glad you were able to get the  [facility] Transportation set up please call them for transportation to your medical appointments  SCAT application is completed Dr. Frutoso Chase office information was faxed 05/18/23 by CMA . However form was returned from [facility],  office will update and resend  You have decided not to move forward with Personal Care Services at this time Congratulations on getting your walker We also discussed Advance Directives, per your request the documents are being mailed            SDOH assessments and interventions completed:  No   Care Coordination Interventions:  Yes, provided  Interventions Today    Flowsheet Row Most Recent Value  Chronic Disease   Chronic disease during today's visit Hypertension (HTN)  General Interventions   General Interventions Discussed/Reviewed Level of Care, Referral to Nurse  Level of Care Personal Care Services  [not interested in PCS at this time]  Education Interventions   Education Provided Provided Education  Nutrition Interventions   Nutrition Discussed/Reviewed Nutrition Discussed  Advanced Directive Interventions   Advanced Directives Discussed/Reviewed Advanced Directives Discussed, Provided resource for acquiring and filling out documents, Advanced Care Planning       Follow up plan:  Referral made to RN care manager for education on managing health Follow up call scheduled for  30 days     Encounter Outcome:  Pt. Visit Completed   [name], LCSW Social Work Care Coordination  [name] [employer] [phone]

## 2023-06-02 DIAGNOSIS — J45998 Other asthma: Secondary | ICD-10-CM | POA: Diagnosis not present

## 2023-06-02 DIAGNOSIS — R0602 Shortness of breath: Secondary | ICD-10-CM | POA: Diagnosis not present

## 2023-06-07 DIAGNOSIS — M170 Bilateral primary osteoarthritis of knee: Secondary | ICD-10-CM | POA: Diagnosis not present

## 2023-06-16 ENCOUNTER — Ambulatory Visit: Payer: Self-pay

## 2023-06-16 NOTE — Patient Instructions (Addendum)
Visit Information  Thank you for taking time to visit with me today. Please don't hesitate to contact me if I can be of assistance to you.   Following are the goals we discussed today:  Attend provider visits as scheduled Take medications as prescribed Review educational material sent in mail and call RN Care coordinator if questions. Contact provider with health questions or concerns as needed   Our next appointment is by telephone on 07/15/23 at 10:30 am  Please call the care guide team at [phone] if you need to cancel or reschedule your appointment.   If you are experiencing a Mental Health or Behavioral Health Crisis or need someone to talk to, please call the Suicide and Crisis Lifeline: 39   Kathyrn Sheriff, RN, MSN, BSN, CCM [facility] Care Coordinator [phone]    How to Take Your Blood Pressure Blood pressure measures how strongly your blood is pressing against the walls of your arteries. Arteries are blood vessels that carry blood from your heart throughout your body. You can take your blood pressure at home with a machine. You may need to check your blood pressure at home: To check if you have high blood pressure (hypertension). To check your blood pressure over time. To make sure your blood pressure medicine is working. Supplies needed: Blood pressure machine, or monitor. A chair to sit in. This should be a chair where you can sit upright with your back supported. Do not sit on a soft couch or an armchair. Table or desk. Small notebook. Pencil or pen. How to prepare Avoid these things for 30 minutes before checking your blood pressure: Having drinks with caffeine in them, such as coffee or tea. Drinking alcohol. Eating. Smoking. Exercising. Do these things five minutes before checking your blood pressure: Go to the bathroom and pee (urinate). Sit in a chair. Be quiet. Do not talk. How to take your blood pressure Follow the instructions that came with your  machine. If you have a digital blood pressure monitor, these may be the instructions: Sit up straight. Place your feet on the floor. Do not cross your ankles or legs. Rest your left arm at the level of your heart. You may rest it on a table, desk, or chair. Pull up your shirt sleeve. Wrap the blood pressure cuff around the upper part of your left arm. The cuff should be 1 inch (2.5 cm) above your elbow. It is best to wrap the cuff around bare skin. Fit the cuff snugly around your arm, but not too tightly. You should be able to place only one finger between the cuff and your arm. Place the cord so that it rests in the bend of your elbow. Press the power button. Sit quietly while the cuff fills with air and loses air. Write down the numbers on the screen. Wait 2-3 minutes and then repeat steps 1-10. What do the numbers mean? Two numbers make up your blood pressure. The first number is called systolic pressure. The second is called diastolic pressure. An example of a blood pressure reading is "120 over 80" (or 120/80). If you are an adult and do not have a medical condition, use this guide to find out if your blood pressure is normal: Normal First number: below 120. Second number: below 80. Elevated First number: 120-129. Second number: below 80. Hypertension stage 1 First number: 130-139. Second number: 80-89. Hypertension stage 2 First number: 140 or above. Second number: 90 or above. Your blood pressure is above normal  even if only the first or only the second number is above normal. Follow these instructions at home: Medicines Take over-the-counter and prescription medicines only as told by your doctor. Tell your doctor if your medicine is causing side effects. General instructions Check your blood pressure as often as your doctor tells you to. Check your blood pressure at the same time every day. Take your monitor to your next doctor's appointment. Your doctor will: Make sure  you are using it correctly. Make sure it is working right. Understand what your blood pressure numbers should be. Keep all follow-up visits. General tips You will need a blood pressure machine or monitor. Your doctor can suggest a monitor. You can buy one at a drugstore or online. When choosing one: Choose one with an arm cuff. Choose one that wraps around your upper arm. Only one finger should fit between your arm and the cuff. Do not choose one that measures your blood pressure from your wrist or finger. Where to find more information American Heart Association: www.heart.org Contact a doctor if: Your blood pressure keeps being high. Your blood pressure is suddenly low. Get help right away if: Your first blood pressure number is higher than 180. Your second blood pressure number is higher than 120. These symptoms may be an emergency. Do not wait to see if the symptoms will go away. Get help right away. Call 911. Summary Check your blood pressure at the same time every day. Avoid caffeine, alcohol, smoking, and exercise for 30 minutes before checking your blood pressure. Make sure you understand what your blood pressure numbers should be. This information is not intended to replace advice given to you by your health care provider. Make sure you discuss any questions you have with your health care provider. Document Revised: 07/31/2021 Document Reviewed: 07/31/2021 Elsevier Patient Education  2024 [employer].

## 2023-06-16 NOTE — Patient Outreach (Signed)
  Care Coordination   Initial Visit Note   06/16/2023 Name: ANET LOGSDON MRN: [id number] DOB: May 07, 1941  SHELIE LANSING is a 82 y.o. year old female who sees Myrlene Broker, MD for primary care. I spoke with  [name] by phone today.  What matters to the patients health and wellness today?  Ms. Rohe states has been having some problems with feet swelling, although she states the swelling in her feet have gone down. She states she has a blood pressure cuff, but does not know how to use. She states grandaughter will help to show her how to use.    Goals Addressed             This Visit's Progress    Education re: feet swelling/HTN       Interventions Today    Flowsheet Row Most Recent Value  Chronic Disease   Chronic disease during today's visit Hypertension (HTN)  General Interventions   General Interventions Discussed/Reviewed General Interventions Discussed, Doctor Visits  Doctor Visits Discussed/Reviewed Doctor Visits Discussed  [reviewed upcoming appointments]  Education Interventions   Education Provided Provided Education  [send [facility] exercise booklet and emmi education on HTN, DASH diet via mail]  Provided Verbal Education On When to see the doctor, Medication, Exercise, Nutrition  [advised to eat healthy-minimize sodium intact, take medications as prescribed, be as active as tolerated, advised to elevate legs when sitting. discussed importance of being as active as possible, good nutrition on health and weight]  Nutrition Interventions   Nutrition Discussed/Reviewed Decreasing salt, Nutrition Discussed  Pharmacy Interventions   Pharmacy Dicussed/Reviewed Pharmacy Topics Discussed  Safety Interventions   Safety Discussed/Reviewed Safety Discussed  [encouraged to utilize assistive devices as recommended]            SDOH assessments and interventions completed:  Yes  SDOH Interventions Today    Flowsheet Row Most Recent Value  SDOH  Interventions   Housing Interventions Intervention Not Indicated  Utilities Interventions Intervention Not Indicated     Care Coordination Interventions:  Yes, provided   Follow up plan: Follow up call scheduled for 07/15/23    Encounter Outcome:  Pt. Visit Completed   Kathyrn Sheriff, RN, MSN, BSN, CCM [facility] Care Coordinator [phone]

## 2023-06-23 DIAGNOSIS — H401122 Primary open-angle glaucoma, left eye, moderate stage: Secondary | ICD-10-CM | POA: Diagnosis not present

## 2023-06-29 ENCOUNTER — Ambulatory Visit: Payer: Self-pay | Admitting: Licensed Clinical Social Worker

## 2023-06-29 NOTE — Patient Outreach (Signed)
  Care Coordination  Follow Up Visit Note   06/29/2023 Name: Heather Grant MRN: [id number] DOB: 24-Nov-1941  Heather Grant is a 82 y.o. year old female who sees Heather Broker, MD for primary care. I spoke with  Heather Grant by phone today.  What matters to the patients health and wellness today?   Patient reports doing well, has been able to utilize [facility] Transportation to medical appointments.  We will continue to work on [employer] and have conversations on Health Care POA and Personal Care Services.  She is not ready to move forward with either at this time.    Goals Addressed             This Visit's Progress    Connect with [employer] for support       Activities and task to complete in order to accomplish goals.   I am glad you were able to get the  [facility] Transportation set up please call them for transportation to your medical appointments  SCAT application is still pending I have contacted the SCAT office You have decided not to move forward with Personal Care Services at this time we will continue to discuss this during out next encounter We also discussed Advance Directives, your received the documents in the mailed but not ready to move forward. You have decided you do not want to connect with Mychart            SDOH assessments and interventions completed:  Yes  SDOH Interventions Today    Flowsheet Row Most Recent Value  SDOH Interventions   Food Insecurity Interventions Intervention Not Indicated       Care Coordination Interventions:  Yes, provided  Interventions Today    Flowsheet Row Most Recent Value  Chronic Disease   Chronic disease during today's visit Hypertension (HTN)  General Interventions   General Interventions Discussed/Reviewed General Interventions Reviewed, Level of Care, Communication with  Communication with --  [SCAT admin. ref patient's pending application and missing information]  Level of  Care Personal Care Services  [discussed : patient is ambivalent with moving forward]  Education Interventions   Education Provided Provided Education  Provided Verbal Education On [employer]  Advanced Directive Interventions   Advanced Directives Discussed/Reviewed Advanced Directives Discussed  [active listening ,  solution focused,  motivational interviewing/ambivalent about moving forward]       Follow up plan: Follow up call scheduled for 08/09/23    Encounter Outcome:  Pt. Visit Completed   Heather Hines, LCSW Social Work Care Coordination  [facility] Heather Grant [employer] [phone]

## 2023-06-29 NOTE — Patient Instructions (Signed)
Social Work Visit Information  Thank you for taking time to visit with me today. Please don't hesitate to contact me if I can be of assistance to you.   Following are the goals we discussed today:   Goals Addressed             This Visit's Progress    Connect with [facility] for support       Activities and task to complete in order to accomplish goals.   I am glad you were able to get the  [facility] Transportation set up please call them for transportation to your medical appointments  SCAT application is still pending I have contacted the SCAT office You have decided not to move forward with Personal Care Services at this time we will continue to discuss this during out next encounter We also discussed Advance Directives, your received the documents in the mailed but not ready to move forward. You have decided you do not want to connect with Mychart             Our next appointment is by telephone on 08/09/23 at 1:15   Please call the care guide team at [phone] if you need to cancel or reschedule your appointment.     The patient verbalized understanding of instructions, educational materials, and care plan provided today and DECLINED offer to receive copy of patient instructions, educational materials, and care plan.     [name], LCSW Social Work Care Coordination  [name] [employer] [phone]

## 2023-07-03 DIAGNOSIS — R0602 Shortness of breath: Secondary | ICD-10-CM | POA: Diagnosis not present

## 2023-07-03 DIAGNOSIS — J45998 Other asthma: Secondary | ICD-10-CM | POA: Diagnosis not present

## 2023-07-05 DIAGNOSIS — M8580 Other specified disorders of bone density and structure, unspecified site: Secondary | ICD-10-CM | POA: Diagnosis not present

## 2023-07-05 DIAGNOSIS — B356 Tinea cruris: Secondary | ICD-10-CM | POA: Diagnosis not present

## 2023-07-05 DIAGNOSIS — I10 Essential (primary) hypertension: Secondary | ICD-10-CM | POA: Diagnosis not present

## 2023-07-05 DIAGNOSIS — K649 Unspecified hemorrhoids: Secondary | ICD-10-CM | POA: Diagnosis not present

## 2023-07-05 DIAGNOSIS — Z1231 Encounter for screening mammogram for malignant neoplasm of breast: Secondary | ICD-10-CM | POA: Diagnosis not present

## 2023-07-05 DIAGNOSIS — K5909 Other constipation: Secondary | ICD-10-CM | POA: Diagnosis not present

## 2023-07-15 ENCOUNTER — Ambulatory Visit: Payer: Self-pay

## 2023-07-15 NOTE — Patient Instructions (Addendum)
Visit Information  Thank you for taking time to visit with me today. Please don't hesitate to contact me if I can be of assistance to you.   Following are the goals we discussed today:  Attend provider visits as scheduled Take medications as prescribed Continue to eat healthy, minimize salt and increase fiber Contact provider with health questions or concerns or worsening condition as needed   Our next appointment is by telephone on 08/25/23 at 10:00 am  Please call the care guide team at [phone] if you need to cancel or reschedule your appointment.   If you are experiencing a Mental Health or Behavioral Health Crisis or need someone to talk to, please call the Suicide and Crisis Lifeline: 988 call the [place] National Suicide Prevention Lifeline: 670-762-4047 or TTY: (803)425-0316 TTY 708-140-8863) to talk to a trained counselor call 1-800-273-TALK (toll free, 24 hour hotline)  Kathyrn Sheriff, RN, MSN, BSN, CCM [employer] Care Coordinator [phone]   Constipation, Adult Constipation is when a person has fewer than three bowel movements in a week, has difficulty having a bowel movement, or has stools (feces) that are dry, hard, or larger than normal. Constipation may be caused by an underlying condition. It may become worse with age if a person takes certain medicines and does not take in enough fluids. Follow these instructions at home: Eating and drinking  Eat foods that have a lot of fiber, such as beans, whole grains, and fresh fruits and vegetables. Limit foods that are low in fiber and high in fat and processed sugars, such as fried or sweet foods. These include french fries, hamburgers, cookies, candies, and soda. Drink enough fluid to keep your urine pale yellow. General instructions Exercise regularly or as told by your health care provider. Try to do 150 minutes of moderate exercise each week. Use the bathroom when you have the urge to go. Do not hold it in. Take  over-the-counter and prescription medicines only as told by your health care provider. This includes any fiber supplements. During bowel movements: Practice deep breathing while relaxing the lower abdomen. Practice pelvic floor relaxation. Watch your condition for any changes. Let your health care provider know about them. Keep all follow-up visits as told by your health care provider. This is important. Contact a health care provider if: You have pain that gets worse. You have a fever. You do not have a bowel movement after 4 days. You vomit. You are not hungry or you lose weight. You are bleeding from the opening between the buttocks (anus). You have thin, pencil-like stools. Get help right away if: You have a fever and your symptoms suddenly get worse. You leak stool or have blood in your stool. Your abdomen is bloated. You have severe pain in your abdomen. You feel dizzy or you faint. Summary Constipation is when a person has fewer than three bowel movements in a week, has difficulty having a bowel movement, or has stools (feces) that are dry, hard, or larger than normal. Eat foods that have a lot of fiber, such as beans, whole grains, and fresh fruits and vegetables. Drink enough fluid to keep your urine pale yellow. Take over-the-counter and prescription medicines only as told by your health care provider. This includes any fiber supplements. This information is not intended to replace advice given to you by your health care provider. Make sure you discuss any questions you have with your health care provider. Document Revised: 09/30/2022 Document Reviewed: 09/30/2022 Elsevier Patient Education  2024 Elsevier  Inc.

## 2023-07-15 NOTE — Patient Outreach (Signed)
  Care Coordination   Follow Up Visit Note   07/15/2023 Name: Heather Grant MRN: [id number] DOB: 01-09-1941  Heather Grant is a 82 y.o. year old female who sees Myrlene Broker, MD for primary care. I spoke with  [name] by phone today.  What matters to the patients health and wellness today?  Reports foot swelling(seen by Primary provider 05/11/23) and constipation. But states she is not constipated at this time. Reports she is losing weight, walks around the house and in the halls of community she lives in. Also reports she received [facility] exercise booklet and denies any questions. exercise limited due to needs knee replacement, but reports she is not going to have this procedure.  Goals Addressed             This Visit's Progress    Education re: feet swelling/HTN       Interventions Today    Flowsheet Row Most Recent Value  Chronic Disease   Chronic disease during today's visit Hypertension (HTN)  General Interventions   General Interventions Discussed/Reviewed General Interventions Reviewed, Durable Medical Equipment (DME)  Durable Medical Equipment (DME) BP Cuff  [confimred she has blood pressure monitor-patient reports her blood pressure is doing good.]  Exercise Interventions   Exercise Discussed/Reviewed Physical Activity  Physical Activity Discussed/Reviewed Physical Activity Discussed, Types of exercise  [confirmed she received exercise booklet from [facility], encouraged to remain as active as tolerated.]  Education Interventions   Education Provided Provided Education  [discussed way to relieve constipation]  Provided Verbal Education On --  [advised to take medications as prescribed, attend provider visits as scheduled and contact provider with any health questions or concerns or worsening condition]            SDOH assessments and interventions completed:  No  Care Coordination Interventions:  Yes, provided   Follow up plan: Follow up call  scheduled for 08/25/23    Encounter Outcome:  Pt. Visit Completed   Kathyrn Sheriff, RN, MSN, BSN, CCM [facility] Care Coordinator [phone]

## 2023-07-23 ENCOUNTER — Other Ambulatory Visit: Payer: Self-pay | Admitting: Internal Medicine

## 2023-08-03 ENCOUNTER — Ambulatory Visit: Payer: Self-pay | Admitting: Licensed Clinical Social Worker

## 2023-08-03 DIAGNOSIS — J45998 Other asthma: Secondary | ICD-10-CM | POA: Diagnosis not present

## 2023-08-03 DIAGNOSIS — R0602 Shortness of breath: Secondary | ICD-10-CM | POA: Diagnosis not present

## 2023-08-03 NOTE — Patient Instructions (Signed)
Social Work Visit Information  Thank you for taking time to visit with me today. Please don't hesitate to contact me if I can be of assistance to you.   Following are the goals we discussed today:   Goals Addressed             This Visit's Progress    COMPLETED: Connect with [facility] for support       Activities and task to complete in order to accomplish goals.   I am glad you were able to get the  [facility] Transportation set up please call them for transportation to your medical appointments  Congratulations on being approved for SCAT to assist with your unmet transportation needs You have decided not to move forward with Personal Care Services at this time we will continue to discuss this during out next encounter We also discussed Advance Directives, your received the documents in the mailed but not ready to move forward. You have decided you do not want to connect with Mychart             No follow up scheduled with social work at this time. Patient will call office if needed.  Please call the care guide team at [phone] if you need to cancel or reschedule your appointment.    The patient verbalized understanding of instructions, educational materials, and care plan provided today and DECLINED offer to receive copy of patient instructions, educational materials, and care plan.   [name], LCSW [place]  [facility], [facility] Licensed Clinical Social Work Care Coordinator  Direct Dial: [phone]

## 2023-08-03 NOTE — Patient Outreach (Signed)
  Care Coordination  Follow Up Visit Note   08/03/2023 Name: Heather Grant MRN: [id number] DOB: 07-Apr-1941  Heather Grant is a 82 y.o. year old female who sees Myrlene Broker, MD for primary care. I spoke with  Heather Grant by phone today.  What matters to the patients health and wellness today?    All goals have been completed during this encounter.  No new goals identified at this time.   Goals Addressed             This Visit's Progress    COMPLETED: Connect with [facility] for support       Activities and task to complete in order to accomplish goals.   I am glad you were able to get the  [facility] Transportation set up please call them for transportation to your medical appointments  Congratulations on being approved for SCAT to assist with your unmet transportation needs You have decided not to move forward with Personal Care Services at this time we will continue to discuss this during out next encounter We also discussed Advance Directives, your received the documents in the mailed but not ready to move forward. You have decided you do not want to connect with Mychart             SDOH assessments and interventions completed:  Yes  SDOH Interventions Today    Flowsheet Row Most Recent Value  SDOH Interventions   Transportation Interventions Intervention Not Indicated  [patient now has SCAT and Medicaid Transportation]       Care Coordination Interventions:  Yes, provided  Interventions Today    Flowsheet Row Most Recent Value  Chronic Disease   Chronic disease during today's visit Hypertension (HTN)  General Interventions   General Interventions Discussed/Reviewed Level of Care  [reminded of up coming appointments]  Level of Care Personal Care Services  [will keep this in mind has her health declines, not interested at this time]  Education Interventions   Education Provided Provided Education       Follow up plan: No  further intervention required.   Encounter Outcome:  Pt. Visit Completed   [name], LCSW [place]  [facility], [facility] Licensed Clinical Social Work Care Coordinator  Direct Dial: [phone]

## 2023-08-09 ENCOUNTER — Encounter: Payer: 59 | Admitting: Licensed Clinical Social Worker

## 2023-08-18 ENCOUNTER — Ambulatory Visit (INDEPENDENT_AMBULATORY_CARE_PROVIDER_SITE_OTHER): Payer: 59 | Admitting: Podiatry

## 2023-08-18 DIAGNOSIS — B351 Tinea unguium: Secondary | ICD-10-CM

## 2023-08-18 DIAGNOSIS — M79676 Pain in unspecified toe(s): Secondary | ICD-10-CM | POA: Diagnosis not present

## 2023-08-18 NOTE — Progress Notes (Signed)
Subjective:  Patient ID: Heather Grant, female    DOB: [birth year],  MRN: [id number]  Heather Grant presents to clinic today for painful elongated mycotic toenails 1-5 bilaterally which are tender when wearing enclosed shoe gear. Pain is relieved with periodic professional debridement.  New problem(s):  Painful porokeratotic lesion plantar aspect of left foot and end of left 3rd digit.  PCP is Myrlene Broker, MD.  No Known Allergies  Review of Systems: Negative except as noted in the HPI. Objective:  There were no vitals filed for this visit. Constitutional Heather Grant is a pleasant 82 y.o. female, in NAD. AAO x 3.   Vascular Vascular Examination: Capillary refill time immediate b/l. Vascular status intact b/l with palpable pedal pulses. Pedal hair absent b/l. No edema. No pain with calf compression b/l. Skin temperature gradient WNL b/l. No cyanosis or clubbing b/l.   Neurological Examination: Sensation grossly intact b/l with 10 gram monofilament. Vibratory sensation intact b/l.   Dermatological Examination: Pedal skin with normal turgor, texture and tone b/l.  No open wounds. No interdigital macerations.   Toenails 1-5 b/l thick, discolored, elongated with subungual debris and pain on dorsal palpation.   Porokeratotic lesion(s) distal tip of left 3rd toe and sub 5th met base left foot. No erythema, no edema, no drainage, no fluctuance.  Musculoskeletal Examination: Muscle strength 5/5 to all lower extremity muscle groups bilaterally. HAV with bunion bilaterally and hammertoes 2-5 b/l. Utilizes rollator for ambulation assistance.  Radiographs: None   Assessment:   1. Pain due to onychomycosis of toenail    Plan:  Patient was evaluated and treated and all questions answered. Consent given for treatment as described below: -Will submit preauthorization to insurance company for paring of porokeratotic lesions x 2 for left foot. -Toenails 1-5 b/l  were debrided in length and girth with sterile nail nippers and dremel without iatrogenic bleeding.  -As a courtesy, porokeratotic lesion(s) distal tip of left 3rd toe and sub 5th met base left foot pared and enucleated without complication or incident. Total number pared=2. -Patient/POA to call should there be question/concern in the interim.  Return in about 3 months (around 11/17/2023).  Freddie Breech, DPM

## 2023-08-23 ENCOUNTER — Encounter: Payer: Self-pay | Admitting: Podiatry

## 2023-08-25 ENCOUNTER — Ambulatory Visit: Payer: Self-pay

## 2023-08-25 NOTE — Patient Instructions (Addendum)
Visit Information  Thank you for taking time to visit with me today. Please don't hesitate to contact me if I can be of assistance to you.   Following are the goals we discussed today:  Continue to take medications as prescribed. Continue to attend provider visits as scheduled Continue to eat healthy, lean meats, vegetables, fruits, avoid saturated and transfats Routinely check blood pressure and notify provider with any questions or concerns Review educational material and contact RN Care Manager if [phone]).  Our next appointment is by telephone on 09/14/23 at 10:30 am  Please call the care guide team at [phone] if you need to cancel or reschedule your appointment.   If you are experiencing a Mental Health or Behavioral Health Crisis or need someone to talk to, please call the Suicide and Crisis Lifeline: 988 call the [place] National Suicide Prevention Lifeline: 305-378-8447 or TTY: 272-364-4073 TTY (267)819-5697) to talk to a trained counselor call 1-800-273-TALK (toll free, 24 hour hotline)        Osteoarthritis  Osteoarthritis is a type of arthritis. It refers to joint pain or joint disease. Osteoarthritis affects tissue that covers the ends of bones in joints (cartilage). Cartilage acts as a cushion between the bones and helps them move smoothly. Osteoarthritis occurs when cartilage in the joints gets worn down. Osteoarthritis is sometimes called "wear and tear" arthritis. Osteoarthritis is the most common form of arthritis. It often occurs in older people. It is a condition that gets worse over time. The joints most often affected by this condition are in the fingers, toes, hips, knees, and spine, including the neck and lower back. What are the causes? This condition is caused by the wearing down of cartilage that covers the ends of bones. What increases the risk? The following factors may make you more likely to develop this condition: Being age 80 or  older. Obesity. Overuse of joints. Past injury of a joint. Past surgery on a joint. Family history of osteoarthritis. What are the signs or symptoms? The main symptoms of this condition are pain, swelling, and stiffness in the joint. Other symptoms may include: An enlarged joint. More pain and further damage caused by small pieces of bone or cartilage that break off and float inside of the joint. Small deposits of bone (osteophytes) that grow on the edges of the joint. A grating or scraping feeling inside the joint when you move it. Popping or creaking sounds when you move. Difficulty walking or exercising. An inability to grip items, twist your hand, or control the movements of your hands and fingers. How is this diagnosed? This condition may be diagnosed based on: Your medical history. A physical exam. Your symptoms. X-rays of the affected joints. Blood tests to rule out other types of arthritis. How is this treated? There is no cure for this condition, but treatment can help control pain and improve joint function. Treatment may include a combination of therapies, such as: Pain relief techniques, such as: Applying heat and cold to the joint. Massage. A form of talk therapy called cognitive behavioral therapy (CBT). This therapy helps you set goals and follow up on the changes that you make. Medicines for pain and inflammation. The medicines can be taken by mouth or applied to the skin. They include: NSAIDs, such as ibuprofen. Prescription medicines. Strong anti-inflammatory medicines (corticosteroids). Certain nutritional supplements. A prescribed exercise program. You may work with a physical therapist. Assistive devices, such as a brace, wrap, splint, specialized glove, or cane. A weight control  plan. Surgery, such as: An osteotomy. This is done to reposition the bones and relieve pain or to remove loose pieces of bone and cartilage. Joint replacement surgery. You may need  this surgery if you have advanced osteoarthritis. Follow these instructions at home: Activity Rest your affected joints as told by your health care provider. Exercise as told by your provider. The provider may recommend specific types of exercise, such as: Strengthening exercises. These are done to strengthen the muscles that support joints affected by arthritis. Aerobic activities. These are exercises, such as brisk walking or water aerobics, that increase your heart rate. Range-of-motion activities. These help your joints move more easily. Balance and agility exercises. Managing pain, stiffness, and swelling     If told, apply heat to the affected area as often as told by your provider. Use the heat source that your provider recommends, such as a moist heat pack or a heating pad. If you have a removable assistive device, remove it as told by your provider. Place a towel between your skin and the heat source. If your provider tells you to keep the assistive device on while you apply heat, place a towel between the assistive device and the heat source. Leave the heat on for 20-30 minutes. If told, put ice on the affected area. If you have a removable assistive device, remove it as told by your provider. Put ice in a plastic bag. Place a towel between your skin and the bag. If your provider tells you to keep the assistive device on during icing, place a towel between the assistive device and the bag. Leave the ice on for 20 minutes, 2-3 times a day. If your skin turns bright red, remove the ice or heat right away to prevent skin damage. The risk of damage is higher if you cannot feel pain, heat, or cold. Move your fingers or toes often to reduce stiffness and swelling. Raise (elevate) the affected area above the level of your heart while you are sitting or lying down. General instructions Take over-the-counter and prescription medicines only as told by your provider. Maintain a healthy  weight. Follow instructions from your provider for weight control. Do not use any products that contain nicotine or tobacco. These products include cigarettes, chewing tobacco, and vaping devices, such as e-cigarettes. If you need help quitting, ask your provider. Use assistive devices as told by your provider. Where to find more information [employer] of Arthritis and Musculoskeletal and Skin Diseases: niams.http://www.myers.net/ [employer] on Aging: BaseRingTones.pl American College of Rheumatology: rheumatology.org Contact a health care provider if: You have redness, swelling, or a feeling of warmth in a joint that gets worse. You have a fever along with joint or muscle aches. You develop a rash. You have trouble doing your normal activities. You have pain that gets worse and is not relieved by pain medicine. This information is not intended to replace advice given to you by your health care provider. Make sure you discuss any questions you have with your health care provider. Document Revised: 07/16/2022 Document Reviewed: 07/16/2022 Elsevier Patient Education  2024 [employer].

## 2023-08-25 NOTE — Patient Outreach (Signed)
Care Coordination   Follow Up Visit Note   08/25/2023 Name: Heather Grant MRN: [id number] DOB: January 31, 1941  Heather Grant is a 82 y.o. year old female who sees Heather Broker, MD for primary care. I spoke with  Heather Grant by phone today.  What matters to the patients health and wellness today?  Heather Grant reports she is doing ok. States has BP monitor and denies any problems with BP.  She reports primary concern today is regarding osteoarthritis. She reports she has changed orthopedic providers to follow and treat osteoarthritis of both knees. She is scheduled with Dr.  Blanchie Grant on 09/03/23. She uses walker to ambulate.   Goals Addressed             This Visit's Progress    Education re: feet swelling/HTN       Interventions Today    Flowsheet Row Most Recent Value  Chronic Disease   Chronic disease during today's visit Other, Hypertension (HTN)  [0steoarthritis]  General Interventions   General Interventions Discussed/Reviewed General Interventions Reviewed  Exercise Interventions   Exercise Discussed/Reviewed Exercise Discussed  Education Interventions   Education Provided Provided Education  [provided via mail emmi article "osteoarthritis", ]  Provided Verbal Education On Other, When to see the doctor, Medication  [discussed importance of maintaining muschle strength and tone, encouraged patient to discuss activities and excersise with her at visit.,]  Pharmacy Interventions   Pharmacy Dicussed/Reviewed Pharmacy Topics Reviewed  Safety Interventions   Safety Discussed/Reviewed Fall Risk, Safety Reviewed            SDOH assessments and interventions completed:  No  Care Coordination Interventions:  Yes, provided   Follow up plan: No further intervention required.   Encounter Outcome:  Patient Visit Completed

## 2023-09-02 DIAGNOSIS — M25562 Pain in left knee: Secondary | ICD-10-CM | POA: Diagnosis not present

## 2023-09-02 DIAGNOSIS — J45998 Other asthma: Secondary | ICD-10-CM | POA: Diagnosis not present

## 2023-09-02 DIAGNOSIS — M25561 Pain in right knee: Secondary | ICD-10-CM | POA: Diagnosis not present

## 2023-09-02 DIAGNOSIS — R0602 Shortness of breath: Secondary | ICD-10-CM | POA: Diagnosis not present

## 2023-09-06 ENCOUNTER — Telehealth: Payer: Self-pay

## 2023-09-06 NOTE — Patient Outreach (Signed)
  Care Coordination   Follow Up Visit Note   09/06/2023 Name: Heather Grant MRN: [id number] DOB: 1941/05/25  Heather Grant is a 82 y.o. year old female who sees Myrlene Broker, MD for primary care. I spoke with  Heather Grant by phone today.  What matters to the patients health and wellness today?  Primary concern today is patient received a letter from [facility] stating that they are going to cut off her Medicaid. Causing anxiety. Reports abdominal discomfort primarily when moving. She states does not hurt when she is laying still. Patient declines RNCM's offer to contact primary care for appointment. Patient to contact provider if it does not improve or worsens.   Goals Addressed             This Visit's Progress    Education re: feet swelling/HTN       Interventions Today    Flowsheet Row Most Recent Value  Chronic Disease   Chronic disease during today's visit Hypertension (HTN), Other  [osteoarthritis]  General Interventions   General Interventions Discussed/Reviewed Communication with, Doctor Visits  Doctor Visits Discussed/Reviewed Doctor Visits Reviewed  [reviewed upcoming appointments.]  Communication with Social Work  Education Interventions   Education Provided Provided Education  [discussed importance of daughter assisting patient in contacting Medicaid staff regarding the letter she received.]  Provided Verbal Education On Other, Medication, When to see the doctor, Mental Health/Coping with Illness  [advised to take medications as prescribed, RNCM offered to schedule an appointment with primary provider regarding abdominal pain. advised patient to contact provider if condition does not improve or worsens.]  Mental Health Interventions   Mental Health Discussed/Reviewed Coping Strategies, Anxiety, Refer to Social Work for counseling  [place] listening and support. reiterated the importance of daughter assisting pateint contacting Medicaid staff.]   Refer to Social Work for counseling regarding Anxiety/Coping  Advanced Directive Interventions   Advanced Directives Discussed/Reviewed Advanced Directives Discussed  [discussed advanced directives-patient reports she has a copy to be completed]            SDOH assessments and interventions completed:  No  Care Coordination Interventions:  No, not indicated   Follow up plan:  as previously scheduled    Encounter Outcome:  Patient Visit Completed   Kathyrn Sheriff, RN, MSN, BSN, CCM Care Management Coordinator [phone]

## 2023-09-06 NOTE — Patient Instructions (Addendum)
Visit Information  Thank you for taking time to visit with me today. Please don't hesitate to contact me if I can be of assistance to you.   Following are the goals we discussed today:  Contact Provider if condition does not improve or worsens or with health questions or concerns. Daughter to assist you with contacting Medicaid worker for clarification of letter Continue to take medications as prescribed. Continue to attend provider visits as scheduled   Our next appointment is by telephone on 09/14/23 at 10:30 am  Please call the care guide team at [phone] if you need to cancel or reschedule your appointment.   If you are experiencing a Mental Health or Behavioral Health Crisis or need someone to talk to, please call the Suicide and Crisis Lifeline: 988 call the [place] National Suicide Prevention Lifeline: 612-159-7219 or TTY: (567) 793-9979 TTY 616-715-0269) to talk to a trained counselor call 1-800-273-TALK (toll free, 24 hour hotline)  Kathyrn Sheriff, RN, MSN, BSN, CCM Care Management Coordinator [phone]

## 2023-09-07 ENCOUNTER — Ambulatory Visit: Payer: Self-pay | Admitting: Licensed Clinical Social Worker

## 2023-09-09 NOTE — Patient Instructions (Signed)
Visit Information  Thank you for taking time to visit with me today. Please don't hesitate to contact me if I can be of assistance to you.   Following are the goals we discussed today:   Goals Addressed             This Visit's Progress    Obtain Supportive Resources-MA Re-certification   On track    Activities and task to complete in order to accomplish goals.   Keep all upcoming appointments discussed today Continue with compliance of taking medication prescribed by Doctor Implement healthy coping skills discussed to assist with management of symptoms Follow up with Department of Social Services          Our next appointment is by telephone on 10/22 at 10 AM  Please call the care guide team at [phone] if you need to cancel or reschedule your appointment.   If you are experiencing a Mental Health or Behavioral Health Crisis or need someone to talk to, please call the Suicide and Crisis Lifeline: 988 call 911   The patient verbalized understanding of instructions, educational materials, and care plan provided today and DECLINED offer to receive copy of patient instructions, educational materials, and care plan.   Jenel Lucks, MSW, LCSW [facility] Care Management [place]  [facility] [email or web address] Phone [phone] 6:19 PM

## 2023-09-09 NOTE — Patient Outreach (Signed)
  Care Coordination   Initial Visit Note   09/07/2023 Name: Heather Grant MRN: [id number] DOB: 15-Oct-1941  Heather Grant is a 82 y.o. year old female who sees Myrlene Broker, MD for primary care. I spoke with  [name] by phone today.  What matters to the patients health and wellness today?  Supportive Resources    Goals Addressed             This Visit's Progress    Obtain Supportive Resources-MA Re-certification   On track    Activities and task to complete in order to accomplish goals.   Keep all upcoming appointments discussed today Continue with compliance of taking medication prescribed by Doctor Implement healthy coping skills discussed to assist with management of symptoms Follow up with Department of Social Services          SDOH assessments and interventions completed:  No     Care Coordination Interventions:  Yes, provided  Interventions Today    Flowsheet Row Most Recent Value  Chronic Disease   Chronic disease during today's visit Hypertension (HTN), Other  [Osteoarthritis]  General Interventions   General Interventions Discussed/Reviewed General Interventions Discussed, Community Resources, Level of Care, Doctor Visits  Doctor Visits Discussed/Reviewed Doctor Visits Discussed  Level of Care Applications  Applications Medicaid  [Pt's Medicaid is at risk of termination due to failure to provide information. Pt reports required ppwk was submitted at DSS on 08/11/23. LCSW obtained CM information and will assist with collaborating with DSS to strengthen support with re-certification]  Education Interventions   Applications Medicaid  [Pt's Medicaid is at risk of termination due to failure to provide information. Pt reports required ppwk was submitted at DSS on 08/11/23. LCSW obtained CM information and will assist with collaborating with DSS to strengthen support with re-certification]  Mental Health Interventions   Mental Health  Discussed/Reviewed Mental Health Discussed, Coping Strategies, Anxiety, Depression  [Patient endorses symptoms of low motivation and low energy for last 2 weeks. Strategies to assist with management of symptoms discussed, including healthy coping skills]  Nutrition Interventions   Nutrition Discussed/Reviewed Nutrition Discussed  Pharmacy Interventions   Pharmacy Dicussed/Reviewed Pharmacy Topics Discussed, Medication Adherence  Safety Interventions   Safety Discussed/Reviewed Safety Discussed       Follow up plan: Follow up call scheduled for 2-4 weeks    Encounter Outcome:  Patient Visit Completed   Jenel Lucks, MSW, LCSW [facility] Care Management [facility]  [facility] [email or web address] Phone [phone] 6:18 PM

## 2023-09-13 ENCOUNTER — Ambulatory Visit (INDEPENDENT_AMBULATORY_CARE_PROVIDER_SITE_OTHER): Payer: 59 | Admitting: Internal Medicine

## 2023-09-13 VITALS — BP 140/80 | HR 59 | Temp 97.6°F | Ht 59.0 in | Wt 167.0 lb

## 2023-09-13 DIAGNOSIS — R109 Unspecified abdominal pain: Secondary | ICD-10-CM | POA: Diagnosis not present

## 2023-09-13 DIAGNOSIS — K219 Gastro-esophageal reflux disease without esophagitis: Secondary | ICD-10-CM | POA: Diagnosis not present

## 2023-09-13 NOTE — Patient Instructions (Signed)
Let us know if the stomach is causing you problems anymore.

## 2023-09-13 NOTE — Progress Notes (Unsigned)
   Subjective:   Patient ID: Heather Grant, female    DOB: 1941-11-15, 82 y.o.   MRN: [id number]  HPI The patient is an 82 YO female coming in for stomach discomfort but this is almost gone now. Started about 2 weeks ago.   Review of Systems  Objective:  Physical Exam  Vitals:   09/13/23 1500 09/13/23 1503  BP: (!) 140/80 (!) 140/80  Pulse: (!) 59   Temp: 97.6 F (36.4 C)   TempSrc: Oral   SpO2: 98%   Weight: 167 lb (75.8 kg)   Height: 4\' 11"  (1.499 m)     Assessment & Plan:

## 2023-09-14 ENCOUNTER — Encounter: Payer: Self-pay | Admitting: Internal Medicine

## 2023-09-14 ENCOUNTER — Ambulatory Visit: Payer: Self-pay

## 2023-09-14 DIAGNOSIS — R109 Unspecified abdominal pain: Secondary | ICD-10-CM | POA: Insufficient documentation

## 2023-09-14 NOTE — Assessment & Plan Note (Signed)
It is unclear if recent stomach pain was a flare of GERD. This is now resolved and non-tender on exam. If this recurs she will call us. This pain recently could have been muscular although she denies change in activity or injury.

## 2023-09-14 NOTE — Patient Outreach (Signed)
  Care Coordination   Follow Up Visit Note   09/14/2023 Name: Heather Grant MRN: [id number] DOB: Jul 13, 1941  Heather Grant is a 82 y.o. year old female who sees Myrlene Broker, MD for primary care. I spoke with  [name] by phone today.  What matters to the patients health and wellness today?  [name] reports she is doing well. Reports recent injections to her knees and reports relief with that. She denies any swelling in her legs. She denies any abdominal discomfort. PCP last seen on 09/13/23. She reports she has a blood pressure monitor now and that her daughter keep a check on her blood pressure. She denies any needs from [facility]. Patient to contact RNCM if needs in the future. LCSW continues to follow. Community resource support/coping strategies.  Goals Addressed             This Visit's Progress    COMPLETED: Education re: feet swelling/HTN       Interventions Today    Flowsheet Row Most Recent Value  Chronic Disease   Chronic disease during today's visit Hypertension (HTN), Other  [osteoarthritis]  General Interventions   General Interventions Discussed/Reviewed General Interventions Reviewed, Doctor Visits  [Evaluation of current treatment plan for health condition and patient's adherence to plan.]  Doctor Visits Discussed/Reviewed Specialist, PCP  PCP/Specialist Visits Compliance with follow-up visit  [reviewed upcoming follow up appointments]  Education Interventions   Education Provided Provided Education  Provided Verbal Education On When to see the doctor, Medication  Mental Health Interventions   Mental Health Discussed/Reviewed Mental Health Discussed  [encouraged to work with LCSW]  Nutrition Interventions   Nutrition Discussed/Reviewed Nutrition Reviewed  Safety Interventions   Safety Discussed/Reviewed Safety Reviewed  [walks with walker and used cane sometimes.]            SDOH assessments and interventions completed:   No  Care Coordination Interventions:  Yes, provided   Follow up plan: No further intervention required.   Encounter Outcome:  Patient Visit Completed   Kathyrn Sheriff, RN, MSN, BSN, CCM Care Management Coordinator [phone]

## 2023-09-14 NOTE — Assessment & Plan Note (Signed)
Sounds to have been muscular in nature with pain on movement. She did not have change in pain with eating. She did not have change in bowels including blood, diarrhea, constipation. Monitor and for any recurrence she will call us.

## 2023-09-14 NOTE — Patient Instructions (Signed)
Visit Information  Thank you for taking time to visit with me today. Please don't hesitate to contact me if I can be of assistance to you.   Following are the goals we discussed today:  Continue to take medications as prescribed. Continue to attend provider visits as scheduled Continue to eat healthy, lean meats, vegetables, fruits, avoid saturated and transfats Contact Provider with health questions or concerns    If you are experiencing a Mental Health or Behavioral Health Crisis or need someone to talk to, please call the Suicide and Crisis Lifeline: 988 call the [place] National Suicide Prevention Lifeline: 628-716-7944 or TTY: 705-454-1682 TTY 361-550-6647) to talk to a trained counselor call 1-800-273-TALK (toll free, 24 hour hotline)  Kathyrn Sheriff, RN, MSN, BSN, CCM Care Management Coordinator [phone]

## 2023-09-21 ENCOUNTER — Ambulatory Visit: Payer: Self-pay | Admitting: Licensed Clinical Social Worker

## 2023-09-22 NOTE — Patient Outreach (Signed)
  Care Coordination   Follow Up Visit Note   09/21/2023 Name: OANH SINEGAL MRN: [id number] DOB: [birth year]  XIAMARA RANNOW is a 82 y.o. year old female who sees Myrlene Broker, MD for primary care. I spoke with  [name] by phone today.  What matters to the patients health and wellness today?  Symptom Management    Goals Addressed             This Visit's Progress    Obtain Supportive Resources-MA Re-certification   On track    Activities and task to complete in order to accomplish goals.   Keep all upcoming appointments discussed today Continue with compliance of taking medication prescribed by Doctor Implement healthy coping skills discussed to assist with management of symptoms          SDOH assessments and interventions completed:  No     Care Coordination Interventions:  Yes, provided  Interventions Today    Flowsheet Row Most Recent Value  Chronic Disease   Chronic disease during today's visit Hypertension (HTN)  General Interventions   General Interventions Discussed/Reviewed General Interventions Reviewed, Doctor Visits  Doctor Visits Discussed/Reviewed Doctor Visits Reviewed  Mental Health Interventions   Mental Health Discussed/Reviewed Mental Health Reviewed, Coping Strategies  [Pt's daughter obtained required ppwk for MA, benefits will not be discontinued. Pt's stress symptoms have decreased, healthy coping skills discussed.]  Pharmacy Interventions   Pharmacy Dicussed/Reviewed Pharmacy Topics Reviewed, Medication Adherence       Follow up plan: Follow up call scheduled for 2-4 weeks    Encounter Outcome:  Patient Visit Completed   Jenel Lucks, MSW, LCSW [facility] Care Management [facility]  [facility] [email or web address] Phone [phone] 12:16 PM

## 2023-09-22 NOTE — Patient Instructions (Signed)
Visit Information  Thank you for taking time to visit with me today. Please don't hesitate to contact me if I can be of assistance to you.   Following are the goals we discussed today:   Goals Addressed             This Visit's Progress    Obtain Supportive Resources-MA Re-certification   On track    Activities and task to complete in order to accomplish goals.   Keep all upcoming appointments discussed today Continue with compliance of taking medication prescribed by Doctor Implement healthy coping skills discussed to assist with management of symptoms          Our next appointment is by telephone on 12/3 at 10 AM  Please call the care guide team at [phone] if you need to cancel or reschedule your appointment.   If you are experiencing a Mental Health or Behavioral Health Crisis or need someone to talk to, please call the Suicide and Crisis Lifeline: 988 call 911   The patient verbalized understanding of instructions, educational materials, and care plan provided today and DECLINED offer to receive copy of patient instructions, educational materials, and care plan.   Jenel Lucks, MSW, LCSW [facility] Care Management [place]  [facility] [email or web address] Phone [phone] 12:16 PM

## 2023-10-03 DIAGNOSIS — J45998 Other asthma: Secondary | ICD-10-CM | POA: Diagnosis not present

## 2023-10-03 DIAGNOSIS — R0602 Shortness of breath: Secondary | ICD-10-CM | POA: Diagnosis not present

## 2023-10-04 ENCOUNTER — Other Ambulatory Visit: Payer: Self-pay | Admitting: Internal Medicine

## 2023-11-01 ENCOUNTER — Telehealth: Payer: Self-pay | Admitting: Licensed Clinical Social Worker

## 2023-11-01 NOTE — Patient Outreach (Signed)
  Care Coordination   Follow Up Visit Note   11/01/2023 Name: AVALEE KAPPEN MRN: [id number] DOB: 1940/12/31  RAHNIYA GUAN is a 82 y.o. year old female who sees Myrlene Broker, MD for primary care. I spoke with  [name] by phone today.  What matters to the patients health and wellness today?  Benefits for Medicaid   SDOH assessments and interventions completed:  No     Care Coordination Interventions:  Yes, provided  Interventions Today    Flowsheet Row Most Recent Value  Chronic Disease   Chronic disease during today's visit Hypertension (HTN)  General Interventions   General Interventions Discussed/Reviewed Level of Care  Level of Care Applications  [Pt reports her medicaid is still active. No concerns regarding coverage]  Applications Medicaid  Education Interventions   Applications Medicaid  Mental Health Interventions   Mental Health Discussed/Reviewed --  [Pt identified stressors. Agreed to schedule f/up with LCSW to further discuss]       Follow up plan: Follow up call scheduled for 12/09    Encounter Outcome:  Patient Visit Completed   Jenel Lucks, MSW, LCSW [facility] Care Management [facility]  [facility] [email or web address] Phone [phone] 3:59 PM

## 2023-11-02 ENCOUNTER — Encounter: Payer: Self-pay | Admitting: Licensed Clinical Social Worker

## 2023-11-02 DIAGNOSIS — R0602 Shortness of breath: Secondary | ICD-10-CM | POA: Diagnosis not present

## 2023-11-02 DIAGNOSIS — J45998 Other asthma: Secondary | ICD-10-CM | POA: Diagnosis not present

## 2023-11-08 ENCOUNTER — Ambulatory Visit: Payer: Self-pay | Admitting: Licensed Clinical Social Worker

## 2023-11-09 NOTE — Patient Instructions (Signed)
Visit Information  Thank you for taking time to visit with me today. Please don't hesitate to contact me if I can be of assistance to you.   Following are the goals we discussed today:   Goals Addressed             This Visit's Progress    Obtain Supportive Resources-MA Re-certification   On track    Activities and task to complete in order to accomplish goals.   Keep all upcoming appointments discussed today Continue with compliance of taking medication prescribed by Doctor Implement healthy coping skills discussed to assist with management of symptoms          Our next appointment is by telephone on 1/6 at 10 AM  Please call the care guide team at [phone] if you need to cancel or reschedule your appointment.   If you are experiencing a Mental Health or Behavioral Health Crisis or need someone to talk to, please call the Suicide and Crisis Lifeline: 988 call 911   The patient verbalized understanding of instructions, educational materials, and care plan provided today and DECLINED offer to receive copy of patient instructions, educational materials, and care plan.   Jenel Lucks, MSW, LCSW [facility] Care Management [place]  [facility] [email or web address] Phone [phone] 5:49 PM

## 2023-11-09 NOTE — Patient Outreach (Signed)
  Care Coordination   Follow Up Visit Note   11/08/2023 Name: DEAIRA CONKEY MRN: [id number] DOB: [birth year]  JEANNETTE DEFAZIO is a 82 y.o. year old female who sees Myrlene Broker, MD for primary care. I spoke with  [name] by phone today.  What matters to the patients health and wellness today?  Symptom Management/Supportive Resources    Goals Addressed             This Visit's Progress    Obtain Supportive Resources-MA Re-certification   On track    Activities and task to complete in order to accomplish goals.   Keep all upcoming appointments discussed today Continue with compliance of taking medication prescribed by Doctor Implement healthy coping skills discussed to assist with management of symptoms          SDOH assessments and interventions completed:  No     Care Coordination Interventions:  Yes, provided  Interventions Today    Flowsheet Row Most Recent Value  Chronic Disease   Chronic disease during today's visit Hypertension (HTN)  General Interventions   General Interventions Discussed/Reviewed General Interventions Reviewed, Doctor Visits  Doctor Visits Discussed/Reviewed Doctor Visits Reviewed  Mental Health Interventions   Mental Health Discussed/Reviewed Mental Health Reviewed, Coping Strategies  Nutrition Interventions   Nutrition Discussed/Reviewed Nutrition Reviewed  Pharmacy Interventions   Pharmacy Dicussed/Reviewed Pharmacy Topics Reviewed, Medication Adherence  Safety Interventions   Safety Discussed/Reviewed Safety Reviewed       Follow up plan: Follow up call scheduled for 2-4 weeks    Encounter Outcome:  Patient Visit Completed   Jenel Lucks, MSW, LCSW [facility] Care Management [facility]  [facility] [email or web address] Phone [phone] 5:48 PM

## 2023-11-12 ENCOUNTER — Encounter (HOSPITAL_BASED_OUTPATIENT_CLINIC_OR_DEPARTMENT_OTHER): Payer: Self-pay | Admitting: Emergency Medicine

## 2023-11-12 ENCOUNTER — Other Ambulatory Visit: Payer: Self-pay

## 2023-11-12 ENCOUNTER — Inpatient Hospital Stay (HOSPITAL_COMMUNITY): Payer: 59

## 2023-11-12 ENCOUNTER — Emergency Department (HOSPITAL_BASED_OUTPATIENT_CLINIC_OR_DEPARTMENT_OTHER): Payer: 59

## 2023-11-12 ENCOUNTER — Inpatient Hospital Stay (HOSPITAL_BASED_OUTPATIENT_CLINIC_OR_DEPARTMENT_OTHER)
Admission: EM | Admit: 2023-11-12 | Discharge: 2023-11-28 | DRG: 988 | Disposition: A | Discharge status: Home Health | Payer: 59 | Attending: Internal Medicine | Admitting: Internal Medicine

## 2023-11-12 DIAGNOSIS — D638 Anemia in other chronic diseases classified elsewhere: Secondary | ICD-10-CM | POA: Diagnosis present

## 2023-11-12 DIAGNOSIS — D72829 Elevated white blood cell count, unspecified: Secondary | ICD-10-CM | POA: Diagnosis present

## 2023-11-12 DIAGNOSIS — I480 Paroxysmal atrial fibrillation: Secondary | ICD-10-CM | POA: Diagnosis present

## 2023-11-12 DIAGNOSIS — Z8249 Family history of ischemic heart disease and other diseases of the circulatory system: Secondary | ICD-10-CM

## 2023-11-12 DIAGNOSIS — R159 Full incontinence of feces: Secondary | ICD-10-CM | POA: Diagnosis present

## 2023-11-12 DIAGNOSIS — R1030 Lower abdominal pain, unspecified: Secondary | ICD-10-CM | POA: Diagnosis present

## 2023-11-12 DIAGNOSIS — Z1611 Resistance to penicillins: Secondary | ICD-10-CM | POA: Diagnosis present

## 2023-11-12 DIAGNOSIS — Z79899 Other long term (current) drug therapy: Secondary | ICD-10-CM

## 2023-11-12 DIAGNOSIS — B9620 Unspecified Escherichia coli [E. coli] as the cause of diseases classified elsewhere: Secondary | ICD-10-CM | POA: Diagnosis present

## 2023-11-12 DIAGNOSIS — M170 Bilateral primary osteoarthritis of knee: Secondary | ICD-10-CM | POA: Diagnosis present

## 2023-11-12 DIAGNOSIS — K5730 Diverticulosis of large intestine without perforation or abscess without bleeding: Secondary | ICD-10-CM | POA: Diagnosis present

## 2023-11-12 DIAGNOSIS — N809 Endometriosis, unspecified: Secondary | ICD-10-CM | POA: Diagnosis present

## 2023-11-12 DIAGNOSIS — E785 Hyperlipidemia, unspecified: Secondary | ICD-10-CM | POA: Diagnosis present

## 2023-11-12 DIAGNOSIS — Z791 Long term (current) use of non-steroidal anti-inflammatories (NSAID): Secondary | ICD-10-CM

## 2023-11-12 DIAGNOSIS — J4540 Moderate persistent asthma, uncomplicated: Secondary | ICD-10-CM | POA: Diagnosis present

## 2023-11-12 DIAGNOSIS — Z6834 Body mass index (BMI) 34.0-34.9, adult: Secondary | ICD-10-CM

## 2023-11-12 DIAGNOSIS — D72828 Other elevated white blood cell count: Secondary | ICD-10-CM | POA: Diagnosis not present

## 2023-11-12 DIAGNOSIS — E860 Dehydration: Secondary | ICD-10-CM | POA: Diagnosis present

## 2023-11-12 DIAGNOSIS — M109 Gout, unspecified: Secondary | ICD-10-CM | POA: Diagnosis present

## 2023-11-12 DIAGNOSIS — E876 Hypokalemia: Secondary | ICD-10-CM | POA: Diagnosis present

## 2023-11-12 DIAGNOSIS — M19042 Primary osteoarthritis, left hand: Secondary | ICD-10-CM | POA: Diagnosis present

## 2023-11-12 DIAGNOSIS — N824 Other female intestinal-genital tract fistulae: Secondary | ICD-10-CM | POA: Diagnosis present

## 2023-11-12 DIAGNOSIS — N882 Stricture and stenosis of cervix uteri: Secondary | ICD-10-CM | POA: Diagnosis present

## 2023-11-12 DIAGNOSIS — N3000 Acute cystitis without hematuria: Secondary | ICD-10-CM | POA: Diagnosis present

## 2023-11-12 DIAGNOSIS — K219 Gastro-esophageal reflux disease without esophagitis: Secondary | ICD-10-CM | POA: Diagnosis present

## 2023-11-12 DIAGNOSIS — R1031 Right lower quadrant pain: Secondary | ICD-10-CM | POA: Diagnosis not present

## 2023-11-12 DIAGNOSIS — N719 Inflammatory disease of uterus, unspecified: Secondary | ICD-10-CM | POA: Diagnosis present

## 2023-11-12 DIAGNOSIS — M51369 Other intervertebral disc degeneration, lumbar region without mention of lumbar back pain or lower extremity pain: Secondary | ICD-10-CM | POA: Diagnosis not present

## 2023-11-12 DIAGNOSIS — I10 Essential (primary) hypertension: Secondary | ICD-10-CM | POA: Diagnosis present

## 2023-11-12 DIAGNOSIS — B966 Bacteroides fragilis [B. fragilis] as the cause of diseases classified elsewhere: Secondary | ICD-10-CM | POA: Diagnosis present

## 2023-11-12 DIAGNOSIS — E669 Obesity, unspecified: Secondary | ICD-10-CM | POA: Diagnosis present

## 2023-11-12 DIAGNOSIS — G43909 Migraine, unspecified, not intractable, without status migrainosus: Secondary | ICD-10-CM | POA: Diagnosis present

## 2023-11-12 DIAGNOSIS — Z9981 Dependence on supplemental oxygen: Secondary | ICD-10-CM | POA: Diagnosis not present

## 2023-11-12 DIAGNOSIS — M19041 Primary osteoarthritis, right hand: Secondary | ICD-10-CM | POA: Diagnosis present

## 2023-11-12 DIAGNOSIS — N939 Abnormal uterine and vaginal bleeding, unspecified: Secondary | ICD-10-CM | POA: Diagnosis not present

## 2023-11-12 DIAGNOSIS — Z5982 Transportation insecurity: Secondary | ICD-10-CM

## 2023-11-12 DIAGNOSIS — E782 Mixed hyperlipidemia: Secondary | ICD-10-CM

## 2023-11-12 DIAGNOSIS — G4733 Obstructive sleep apnea (adult) (pediatric): Secondary | ICD-10-CM | POA: Diagnosis present

## 2023-11-12 DIAGNOSIS — K632 Fistula of intestine: Secondary | ICD-10-CM | POA: Diagnosis not present

## 2023-11-12 DIAGNOSIS — M722 Plantar fascial fibromatosis: Secondary | ICD-10-CM | POA: Diagnosis present

## 2023-11-12 DIAGNOSIS — R001 Bradycardia, unspecified: Secondary | ICD-10-CM | POA: Diagnosis not present

## 2023-11-12 DIAGNOSIS — N898 Other specified noninflammatory disorders of vagina: Secondary | ICD-10-CM | POA: Diagnosis present

## 2023-11-12 DIAGNOSIS — R630 Anorexia: Secondary | ICD-10-CM | POA: Diagnosis present

## 2023-11-12 DIAGNOSIS — B9689 Other specified bacterial agents as the cause of diseases classified elsewhere: Secondary | ICD-10-CM | POA: Diagnosis not present

## 2023-11-12 DIAGNOSIS — D649 Anemia, unspecified: Secondary | ICD-10-CM | POA: Diagnosis present

## 2023-11-12 HISTORY — DX: Moderate persistent asthma, uncomplicated: J45.40

## 2023-11-12 LAB — URINALYSIS, W/ REFLEX TO CULTURE (INFECTION SUSPECTED)
Bilirubin Urine: NEGATIVE
Glucose, UA: NEGATIVE mg/dL
Ketones, ur: NEGATIVE mg/dL
Nitrite: POSITIVE — AB
Protein, ur: 30 mg/dL — AB
Specific Gravity, Urine: >1.046 — ABNORMAL HIGH (ref 1.005–1.030)
WBC, UA: >50 WBC/hpf (ref 0–5)
pH: 5.5 (ref 5.0–8.0)

## 2023-11-12 LAB — CBC WITH DIFFERENTIAL/PLATELET
Abs Immature Granulocytes: 3.25 10*3/uL — ABNORMAL HIGH (ref 0.00–0.07)
Basophils Absolute: 0.2 10*3/uL — ABNORMAL HIGH (ref 0.0–0.1)
Basophils Relative: 0 %
Eosinophils Absolute: 0 10*3/uL (ref 0.0–0.5)
Eosinophils Relative: 0 %
HCT: 33.9 % — ABNORMAL LOW (ref 36.0–46.0)
Hemoglobin: 11.2 g/dL — ABNORMAL LOW (ref 12.0–15.0)
Immature Granulocytes: 7 %
Lymphocytes Relative: 5 %
Lymphs Abs: 2.3 10*3/uL (ref 0.7–4.0)
MCH: 30.7 pg (ref 26.0–34.0)
MCHC: 33 g/dL (ref 30.0–36.0)
MCV: 92.9 fL (ref 80.0–100.0)
Monocytes Absolute: 3.6 10*3/uL — ABNORMAL HIGH (ref 0.1–1.0)
Monocytes Relative: 8 %
Neutro Abs: 35.8 10*3/uL — ABNORMAL HIGH (ref 1.7–7.7)
Neutrophils Relative %: 80 %
Platelets: 409 10*3/uL — ABNORMAL HIGH (ref 150–400)
RBC: 3.65 MIL/uL — ABNORMAL LOW (ref 3.87–5.11)
RDW: 12.8 % (ref 11.5–15.5)
WBC: 45.2 10*3/uL — ABNORMAL HIGH (ref 4.0–10.5)
nRBC: 0 % (ref 0.0–0.2)

## 2023-11-12 LAB — COMPREHENSIVE METABOLIC PANEL
ALT: 11 U/L (ref 0–44)
AST: 17 U/L (ref 15–41)
Albumin: 3.5 g/dL (ref 3.5–5.0)
Alkaline Phosphatase: 73 U/L (ref 38–126)
Anion gap: 11 (ref 5–15)
BUN: 14 mg/dL (ref 8–23)
CO2: 25 mmol/L (ref 22–32)
Calcium: 9.6 mg/dL (ref 8.9–10.3)
Chloride: 102 mmol/L (ref 98–111)
Creatinine, Ser: 1.29 mg/dL — ABNORMAL HIGH (ref 0.44–1.00)
GFR, Estimated: 42 mL/min — ABNORMAL LOW (ref >60)
Glucose, Bld: 106 mg/dL — ABNORMAL HIGH (ref 70–99)
Potassium: 3.6 mmol/L (ref 3.5–5.1)
Sodium: 138 mmol/L (ref 135–145)
Total Bilirubin: 1.4 mg/dL — ABNORMAL HIGH (ref <1.2)
Total Protein: 6.5 g/dL (ref 6.5–8.1)

## 2023-11-12 LAB — LIPASE, BLOOD: Lipase: <10 U/L — ABNORMAL LOW (ref 11–51)

## 2023-11-12 LAB — LACTIC ACID, PLASMA: Lactic Acid, Venous: 1 mmol/L (ref 0.5–1.9)

## 2023-11-12 MED ORDER — SODIUM CHLORIDE 0.9 % IV SOLN
2.0000 g | INTRAVENOUS | Status: DC
  Administered 2023-11-13 – 2023-11-14 (×2): 2 g via INTRAVENOUS
  Filled 2023-11-12 (×2): qty 20

## 2023-11-12 MED ORDER — MELATONIN 3 MG PO TABS: 3.0000 mg | ORAL_TABLET | Freq: Every evening | ORAL | Status: DC | PRN

## 2023-11-12 MED ORDER — IOHEXOL 300 MG/ML  SOLN
100.0000 mL | Freq: Once | INTRAMUSCULAR | Status: AC | PRN
  Administered 2023-11-12: 100 mL via INTRAVENOUS

## 2023-11-12 MED ORDER — ONDANSETRON HCL 4 MG/2ML IJ SOLN
4.0000 mg | Freq: Once | INTRAMUSCULAR | Status: AC
  Administered 2023-11-12: 4 mg via INTRAVENOUS
  Filled 2023-11-12: qty 2

## 2023-11-12 MED ORDER — SODIUM CHLORIDE 0.9 % IV BOLUS
500.0000 mL | Freq: Once | INTRAVENOUS | Status: AC
  Administered 2023-11-12: 500 mL via INTRAVENOUS

## 2023-11-12 MED ORDER — NALOXONE HCL 0.4 MG/ML IJ SOLN: 0.4000 mg | INTRAMUSCULAR | Status: DC | PRN

## 2023-11-12 MED ORDER — SODIUM CHLORIDE 0.9 % IV SOLN
1.0000 g | Freq: Once | INTRAVENOUS | Status: AC
  Administered 2023-11-12: 1 g via INTRAVENOUS
  Filled 2023-11-12: qty 10

## 2023-11-12 MED ORDER — ACETAMINOPHEN 325 MG PO TABS
650.0000 mg | ORAL_TABLET | Freq: Four times a day (QID) | ORAL | Status: DC | PRN
  Administered 2023-11-12 – 2023-11-26 (×4): 650 mg via ORAL
  Filled 2023-11-12 (×4): qty 2

## 2023-11-12 MED ORDER — FENTANYL CITRATE PF 50 MCG/ML IJ SOSY: 25.0000 ug | PREFILLED_SYRINGE | INTRAMUSCULAR | Status: DC | PRN

## 2023-11-12 MED ORDER — METRONIDAZOLE 500 MG/100ML IV SOLN
500.0000 mg | Freq: Two times a day (BID) | INTRAVENOUS | Status: DC
  Administered 2023-11-13 – 2023-11-14 (×3): 500 mg via INTRAVENOUS
  Filled 2023-11-12 (×3): qty 100

## 2023-11-12 MED ORDER — METRONIDAZOLE 500 MG/100ML IV SOLN
500.0000 mg | Freq: Once | INTRAVENOUS | Status: AC
  Administered 2023-11-12: 500 mg via INTRAVENOUS
  Filled 2023-11-12: qty 100

## 2023-11-12 MED ORDER — ACETAMINOPHEN 650 MG RE SUPP: 650.0000 mg | Freq: Four times a day (QID) | RECTAL | Status: DC | PRN

## 2023-11-12 MED ORDER — ONDANSETRON HCL 4 MG/2ML IJ SOLN
4.0000 mg | Freq: Four times a day (QID) | INTRAMUSCULAR | Status: DC | PRN
  Administered 2023-11-15 – 2023-11-18 (×2): 4 mg via INTRAVENOUS
  Filled 2023-11-12: qty 2

## 2023-11-12 NOTE — ED Triage Notes (Signed)
Abdo cramping and diarrhea. Started last week worse last night.  Pain worse when standing

## 2023-11-12 NOTE — Progress Notes (Signed)
Plan of Care Note for accepted transfer   Patient: Heather Grant MRN: [id number]   DOA: 11/12/2023  [facility] Requesting Provider: Dr Karene Fry Reason for transfer: admission for endometitis, possible colouterine fistula, UTI    CT abd/pelvis IMPRESSION: 1. Frothy debris with air-fluid level within the uterus concerning for endometritis. Pelvic ultrasound and gynecology consult is recommended. 2. Question colo-uterine fistula between the sigmoid colon and posterior right uterus.    Plan of care: The patient is accepted for admission to Med-surg  unit, at [facility]..  Rocephin and Flagyl started.  Dr Richardson Dopp of Gyn consulted by ED.       Author: Buena Irish, MD 11/12/2023  Check [email or web address] for on-call coverage.  Nursing staff, Please call [facility] Admits & Consults System-Wide number on Amion as soon as patient's arrival, so appropriate admitting provider can evaluate the pt.

## 2023-11-12 NOTE — ED Notes (Signed)
Pt is aware urine sample is needed. 

## 2023-11-12 NOTE — ED Provider Notes (Signed)
  Physical Exam  BP 125/85 (BP Location: Left Arm)   Pulse 85   Temp 98.3 F (36.8 C) (Oral)   Resp 16   SpO2 100%   Physical Exam  Procedures  Procedures  ED Course / MDM   Clinical Course as of 11/12/23 1830  Fri Nov 12, 2023  1604 Nitrite(!): POSITIVE [JL]  1604 Glori Luis): MODERATE [JL]  1604 WBC, UA: >50 [JL]  1604 Bacteria, UA(!): MANY [JL]  1604 WBC(!): 45.2 [JL]    Clinical Course User Index [JL] Ernie Avena, MD   Medical Decision Making Amount and/or Complexity of Data Reviewed Labs: ordered. Decision-making details documented in ED Course. Radiology: ordered.  Risk Prescription drug management. Decision regarding hospitalization.   76F, diarrhea for 1wk, CT pending, leukocytosis on labs, complaining of abdominal cramping.   Alysis grossly positive for UTI, patient with leukocytosis to 45, repeat temperature checks revealed a temperature of 100.3 however rectal temp was 99.7, patient not fully meeting SIRS criteria however septic workup initiated.  Blood cultures x 2 collected in addition to lactic acid.  CT Abdomen Pelvis: IMPRESSION:  1. Frothy debris with air-fluid level within the uterus concerning  for endometritis. Pelvic ultrasound and gynecology consult is  recommended.  2. Question colo-uterine fistula between the sigmoid colon and  posterior right uterus.    These results were called by telephone at the time of interpretation  on 11/12/2023 at 5:13 pm to provider Dr. Karene Fry, Who verbally  acknowledged these results.    The patient was administered an IV fluid bolus and was covered with Rocephin and Flagyl for antibiotics.  She was informed of the concerning findings on CT.  I spoke with Dr. Richardson Dopp of on-call OB/GYN who is of [facility] physicians but is currently covering call for the patient's OB/GYN practice [place].  States that the patient will be seen in consultation, agreed with IV antibiotics, no immediate surgical  intervention, recommended pelvic ultrasound for further evaluation which was ordered. Hospitalist medicine consulted for admission, patient and family updated regarding the plan of care.  Patient was hemodynamically stable at time of admission.   Ernie Avena, MD 11/12/23 [phone]

## 2023-11-12 NOTE — ED Provider Notes (Signed)
[place] EMERGENCY DEPARTMENT AT [facility] Provider Note   CSN: [id number] Arrival date & time: 11/12/23  1151     History  Chief Complaint  Patient presents with   Abdominal Pain   Diarrhea    Heather Grant is a 82 y.o. female.   Abdominal Pain Associated symptoms: diarrhea   Diarrhea Associated symptoms: abdominal pain   Patient presents with abdominal pain and diarrhea.  Has had for the last week with worse last night.  Pain in her lower abdomen.  Worse with standing.  States has had decreased appetite.  Leg pain worse with standing.  Does not have much of an appetite.    Past Medical History:  Diagnosis Date   Arthritis    hands and knees. May be hips   Gout    great toe, ankles   Hyperlipidemia    Hypertension    Migraines    Nocturnal dyspnea    ONO 2010 with several readings  below 88%   Obesity, Class II, BMI 35-39.9    OSA (obstructive sleep apnea)    mild by study Dec '09. AHI 5.7   Plantar fasciitis    Varicella     Home Medications Prior to Admission medications   Medication Sig Start Date End Date Taking? Authorizing Provider  albuterol (VENTOLIN HFA) 108 (90 Base) MCG/ACT inhaler USE 1 TO 2 INHALATIONS BY MOUTH  EVERY 6 HOURS AS NEEDED FOR  WHEEZING OR SHORTNESS OF BREATH 03/04/23   Myrlene Broker, MD  Cholecalciferol 50 MCG (2000 UT) CAPS Vitamin D3 50 mcg (2,000 unit) capsule  Take 1 capsule every day by oral route as directed.    [provider]  colchicine 0.6 MG tablet TAKE 1 TABLET BY MOUTH DAILY FOR GOUT FLARE 07/23/23   Myrlene Broker, MD  DULERA 100-5 MCG/ACT AERO USE 2 INHALATIONS BY MOUTH TWICE DAILY 07/23/23   Myrlene Broker, MD  famotidine (PEPCID) 20 MG tablet TAKE 1 TABLET BY MOUTH DAILY AT  BEDTIME 10/27/22   Myrlene Broker, MD  hydrALAZINE (APRESOLINE) 25 MG tablet TAKE 1 TABLET BY MOUTH 3 TIMES  DAILY 10/04/23   Myrlene Broker, MD  LUMIGAN 0.01 % SOLN 1 drop at bedtime.  12/10/21   [provider]  meloxicam (MOBIC) 15 MG tablet TAKE 1 TABLET BY MOUTH DAILY 03/04/23   Myrlene Broker, MD  metoprolol tartrate (LOPRESSOR) 25 MG tablet TAKE 1 TABLET BY MOUTH TWICE  DAILY 10/12/22   Myrlene Broker, MD  nystatin cream (MYCOSTATIN) APPLY TO AFFECTED AREA TWICE A DAY 01/28/22   [provider]  OXYGEN Inhale 2 L into the lungs at bedtime.    [provider]  pantoprazole (PROTONIX) 40 MG tablet TAKE 1 TABLET BY MOUTH DAILY 30  TO 60 MINUTES BEFORE FIRST MEAL  OF THE DAY 03/04/23   Myrlene Broker, MD  Polyvinyl Alcohol (LUBRICANT DROPS OP) Place 1 drop into both eyes daily.    [provider]  rosuvastatin (CRESTOR) 20 MG tablet TAKE 1 TABLET BY MOUTH DAILY 10/20/22   Myrlene Broker, MD  VOLTAREN 1 % GEL Apply 2 g topically daily as needed (knee pain).  08/10/14   [provider]      Allergies    Patient has no known allergies.    Review of Systems   Review of Systems  Gastrointestinal:  Positive for abdominal pain and diarrhea.    Physical Exam Updated Vital Signs BP 125/85 (BP  Location: Left Arm)   Pulse 85   Temp 98.3 F (36.8 C) (Oral)   Resp 16   SpO2 100%  Physical Exam Vitals and nursing note reviewed.  Cardiovascular:     Rate and Rhythm: Normal rate.  Pulmonary:     Breath sounds: Normal breath sounds.  Abdominal:     Tenderness: There is abdominal tenderness.     Comments: Lower abdominal tenderness.  No rebound or guarding.  May have some fullness.  Skin:    General: Skin is warm.     Capillary Refill: Capillary refill takes less than 2 seconds.     ED Results / Procedures / Treatments   Labs (all labs ordered are listed, but only abnormal results are displayed) Labs Reviewed  COMPREHENSIVE METABOLIC PANEL  LIPASE, BLOOD  CBC WITH DIFFERENTIAL/PLATELET  URINALYSIS, W/ REFLEX TO CULTURE (INFECTION SUSPECTED)    EKG None  Radiology No results  found.  Procedures Procedures    Medications Ordered in ED Medications  sodium chloride 0.9 % bolus 500 mL (0 mLs Intravenous Stopped 11/12/23 1401)  ondansetron (ZOFRAN) injection 4 mg (4 mg Intravenous Given 11/12/23 1317)    ED Course/ Medical Decision Making/ A&P                                 Medical Decision Making Amount and/or Complexity of Data Reviewed Labs: ordered. Radiology: ordered.  Risk Prescription drug management.   Patient with abdominal pain and diarrhea.  Decreased oral intake.  Feels worse with standing.  Differential diagnosis long but includes causes such as colitis.  Will get basic blood work.  Will give fluid bolus and antiemetics.  Will get CT scan.  White count is severely elevated.  Causes such as C. difficile could give this elevation.  However still does not appear septic.  Not tachycardic and afebrile.  CT scan still pending.  Care turned over to Dr. Karene Fry        Final Clinical Impression(s) / ED Diagnoses Final diagnoses:  None    Rx / DC Orders ED Discharge Orders     None         Benjiman Core, MD 11/12/23 1529

## 2023-11-12 NOTE — H&P (Signed)
History and Physical      Heather Grant:[id number] DOB: July 12, 1941 DOA: 11/12/2023; DOS: 11/12/2023  PCP: Myrlene Broker, MD *** Patient coming from: home ***  I have personally briefly reviewed patient's old medical records in [facility]  Chief Complaint: ***  HPI: Heather Grant is a 82 y.o. female with medical history significant for *** who is admitted to [facility] on 11/12/2023 with *** after presenting from home*** to [facility] ED complaining of ***.    ***       ***   ED Course:  Vital signs in the ED were notable for the following: ***  Labs were notable for the following: ***  Per my interpretation, EKG in ED demonstrated the following:  ***  Imaging in the ED, per corresponding formal radiology read, was notable for the following:  ***  While in the ED, the following were administered: ***  Subsequently, the patient was admitted  ***  ***red    Review of Systems: As per HPI otherwise 10 point review of systems negative.   Past Medical History:  Diagnosis Date   Arthritis    hands and knees. May be hips   Gout    great toe, ankles   Hyperlipidemia    Hypertension    Migraines    Nocturnal dyspnea    ONO 2010 with several readings  below 88%   Obesity, Class II, BMI 35-39.9    OSA (obstructive sleep apnea)    mild by study Dec '09. AHI 5.7   Plantar fasciitis    Varicella     Past Surgical History:  Procedure Laterality Date   CARPAL TUNNEL RELEASE     right   cataract     right eye with IOL   COLONOSCOPY WITH PROPOFOL N/A 04/26/2018   Procedure: COLONOSCOPY WITH PROPOFOL;  Surgeon: Sherrilyn Rist, MD;  Location: [facility] ENDOSCOPY;  Service: Gastroenterology;  Laterality: N/A;   DILATION AND CURETTAGE OF UTERUS     G5P4     NSVD    Social History:  reports that she has never smoked. She has never used smokeless tobacco. She reports current alcohol use. She reports that she does not use drugs.   No  Known Allergies  Family History  Problem Relation Age of Onset   Hypertension Mother    Stroke Father    Lung cancer Sister        smoked   Asthma Son     Family history reviewed and not pertinent ***   Prior to Admission medications   Medication Sig Start Date End Date Taking? Authorizing Provider  albuterol (VENTOLIN HFA) 108 (90 Base) MCG/ACT inhaler USE 1 TO 2 INHALATIONS BY MOUTH  EVERY 6 HOURS AS NEEDED FOR  WHEEZING OR SHORTNESS OF BREATH 03/04/23  Yes Myrlene Broker, MD  Cholecalciferol 50 MCG (2000 UT) CAPS Vitamin D3 50 mcg (2,000 unit) capsule  Take 1 capsule every day by oral route as directed.   Yes [provider]  colchicine 0.6 MG tablet TAKE 1 TABLET BY MOUTH DAILY FOR GOUT FLARE 07/23/23  Yes Myrlene Broker, MD  DULERA 100-5 MCG/ACT AERO USE 2 INHALATIONS BY MOUTH TWICE DAILY 07/23/23  Yes Myrlene Broker, MD  famotidine (PEPCID) 20 MG tablet TAKE 1 TABLET BY MOUTH DAILY AT  BEDTIME 10/27/22  Yes Myrlene Broker, MD  hydrALAZINE (APRESOLINE) 25 MG tablet TAKE 1 TABLET BY MOUTH 3 TIMES  DAILY 10/04/23  Yes  Myrlene Broker, MD  LUMIGAN 0.01 % SOLN 1 drop at bedtime. 12/10/21  Yes [provider]  meloxicam (MOBIC) 15 MG tablet TAKE 1 TABLET BY MOUTH DAILY 03/04/23  Yes Myrlene Broker, MD  metoprolol tartrate (LOPRESSOR) 25 MG tablet TAKE 1 TABLET BY MOUTH TWICE  DAILY 10/12/22  Yes Myrlene Broker, MD  nystatin cream (MYCOSTATIN) APPLY TO AFFECTED AREA TWICE A DAY 01/28/22  Yes [provider]  OXYGEN Inhale 2 L into the lungs at bedtime.   Yes [provider]  pantoprazole (PROTONIX) 40 MG tablet TAKE 1 TABLET BY MOUTH DAILY 30  TO 60 MINUTES BEFORE FIRST MEAL  OF THE DAY 03/04/23  Yes Myrlene Broker, MD  Polyvinyl Alcohol (LUBRICANT DROPS OP) Place 1 drop into both eyes daily.   Yes [provider]  rosuvastatin (CRESTOR) 20 MG tablet TAKE 1 TABLET BY MOUTH DAILY 10/20/22  Yes  Myrlene Broker, MD  VOLTAREN 1 % GEL Apply 2 g topically daily as needed (knee pain).  08/10/14  Yes [provider]     Objective    Physical Exam: Vitals:   11/12/23 1847 11/12/23 1915 11/12/23 1930 11/12/23 2052  BP: (!) 108/93 112/78 119/64 (!) 124/113  Pulse: 61 74  79  Resp: 16 18 16 18   Temp:    98.7 F (37.1 C)  TempSrc:    Oral  SpO2: 97% 98% 96% 100%    General: appears to be stated age; alert, oriented Skin: warm, dry, no rash Head:  AT/[place] Mouth:  Oral mucosa membranes appear moist, normal dentition Neck: supple; trachea midline Heart:  RRR; did not appreciate any M/R/G Lungs: CTAB, did not appreciate any wheezes, rales, or rhonchi Abdomen: + BS; soft, ND, NT Vascular: 2+ pedal pulses b/l; 2+ radial pulses b/l Extremities: no peripheral edema, no muscle wasting Neuro: strength and sensation intact in upper and lower extremities b/l ***   *** Neuro: 5/5 strength of the proximal and distal flexors and extensors of the upper and lower extremities bilaterally; sensation intact in upper and lower extremities b/l; cranial nerves II through XII grossly intact; no pronator drift; no evidence suggestive of slurred speech, dysarthria, or facial droop; Normal muscle tone. No tremors.  *** Neuro: In the setting of the patient's current mental status and associated inability to follow instructions, unable to perform full neurologic exam at this time.  As such, assessment of strength, sensation, and cranial nerves is limited at this time. Patient noted to spontaneously move all 4 extremities. No tremors.  ***    Labs on Admission: I have personally reviewed following labs and imaging studies  CBC: Recent Labs  Lab 11/12/23 1302  WBC 45.2*  NEUTROABS 35.8*  HGB 11.2*  HCT 33.9*  MCV 92.9  PLT 409*   Basic Metabolic Panel: Recent Labs  Lab 11/12/23 1302  NA 138  K 3.6  CL 102  CO2 25  GLUCOSE 106*  BUN 14  CREATININE 1.29*  CALCIUM 9.6    GFR: CrCl cannot be calculated (Unknown ideal weight.). Liver Function Tests: Recent Labs  Lab 11/12/23 1302  AST 17  ALT 11  ALKPHOS 73  BILITOT 1.4*  PROT 6.5  ALBUMIN 3.5   Recent Labs  Lab 11/12/23 1302  LIPASE <10*   No results for input(s): "AMMONIA" in the last 168 hours. Coagulation Profile: No results for input(s): "INR", "PROTIME" in the last 168 hours. Cardiac Enzymes: No results for input(s): "CKTOTAL", "CKMB", "CKMBINDEX", "TROPONINI" in the  last 168 hours. BNP (last 3 results) Recent Labs    05/11/23 1605  PROBNP 253.0*   HbA1C: No results for input(s): "HGBA1C" in the last 72 hours. CBG: No results for input(s): "GLUCAP" in the last 168 hours. Lipid Profile: No results for input(s): "CHOL", "HDL", "LDLCALC", "TRIG", "CHOLHDL", "LDLDIRECT" in the last 72 hours. Thyroid Function Tests: No results for input(s): "TSH", "T4TOTAL", "FREET4", "T3FREE", "THYROIDAB" in the last 72 hours. Anemia Panel: No results for input(s): "VITAMINB12", "FOLATE", "FERRITIN", "TIBC", "IRON", "RETICCTPCT" in the last 72 hours. Urine analysis:    Component Value Date/Time   COLORURINE ORANGE (A) 11/12/2023 1302   APPEARANCEUR CLEAR 11/12/2023 1302   LABSPEC >1.046 (H) 11/12/2023 1302   PHURINE 5.5 11/12/2023 1302   GLUCOSEU NEGATIVE 11/12/2023 1302   HGBUR TRACE (A) 11/12/2023 1302   BILIRUBINUR NEGATIVE 11/12/2023 1302   KETONESUR NEGATIVE 11/12/2023 1302   PROTEINUR 30 (A) 11/12/2023 1302   NITRITE POSITIVE (A) 11/12/2023 1302   LEUKOCYTESUR MODERATE (A) 11/12/2023 1302    Radiological Exams on Admission: CT ABDOMEN PELVIS W CONTRAST Result Date: 11/12/2023 CLINICAL DATA:  Right lower quadrant abdominal pain EXAM: CT ABDOMEN AND PELVIS WITH CONTRAST TECHNIQUE: Multidetector CT imaging of the abdomen and pelvis was performed using the standard protocol following bolus administration of intravenous contrast. RADIATION DOSE REDUCTION: This exam was performed  according to the departmental dose-optimization program which includes automated exposure control, adjustment of the mA and/or kV according to patient size and/or use of iterative reconstruction technique. CONTRAST:  OMNIPAQUE IOHEXOL 300 MG/ML  SOLN COMPARISON:  None Available. FINDINGS: Lower chest: No acute abnormality. Hepatobiliary: Unremarkable liver. Normal gallbladder. No biliary dilation. Pancreas: Unremarkable. Spleen: Unremarkable. Adrenals/Urinary Tract: Normal adrenal glands. Low-attenuation lesions in the kidneys are statistically likely to represent cysts. No follow-up is required. No urinary calculi or hydronephrosis. Bladder is unremarkable. Stomach/Bowel: Normal caliber large and small bowel. Colonic diverticulosis without diverticulitis. No bowel wall thickening. The appendix is normal.Stomach is within normal limits. Vascular/Lymphatic: No significant vascular findings are present. No enlarged abdominal or pelvic lymph nodes. Reproductive: Frothy debris with air-fluid level within the dilated uterus. Mild fat stranding anterior to the uterus. Ovaries are unremarkable. Other: Question free intraperitoneal air and trace fluid between the posterior right uterus and the sigmoid colon (circa series 6/image 56-61). Musculoskeletal: No acute fracture. Multilevel thoracolumbar spondylosis IMPRESSION: 1. Frothy debris with air-fluid level within the uterus concerning for endometritis. Pelvic ultrasound and gynecology consult is recommended. 2. Question colo-uterine fistula between the sigmoid colon and posterior right uterus. These results were called by telephone at the time of interpretation on 11/12/2023 at 5:13 pm to provider Dr. Karene Fry, Who verbally acknowledged these results. Electronically Signed   By: Minerva Fester M.D.   On: 11/12/2023 17:14      Assessment/Plan   Principal Problem:    Endometritis   ***            ***                ***                 ***               ***               ***               ***                ***               ***               ***               ***               ***              ***          ***  DVT prophylaxis: SCD's ***  Code Status: Full code*** Family Communication: none*** Disposition Plan: Per Rounding Team Consults called: none***;  Admission status: ***     I SPENT GREATER THAN 75 *** MINUTES IN CLINICAL CARE TIME/MEDICAL DECISION-MAKING IN COMPLETING THIS ADMISSION.      Chaney Born Madylyn Insco DO [facility]  From 7PM - 7AM   11/12/2023, 9:15 PM   ***

## 2023-11-13 ENCOUNTER — Inpatient Hospital Stay (HOSPITAL_COMMUNITY): Payer: 59

## 2023-11-13 DIAGNOSIS — D72829 Elevated white blood cell count, unspecified: Secondary | ICD-10-CM | POA: Diagnosis not present

## 2023-11-13 DIAGNOSIS — N3000 Acute cystitis without hematuria: Secondary | ICD-10-CM | POA: Diagnosis not present

## 2023-11-13 DIAGNOSIS — J4540 Moderate persistent asthma, uncomplicated: Secondary | ICD-10-CM | POA: Diagnosis present

## 2023-11-13 DIAGNOSIS — D649 Anemia, unspecified: Secondary | ICD-10-CM | POA: Diagnosis not present

## 2023-11-13 DIAGNOSIS — E860 Dehydration: Secondary | ICD-10-CM | POA: Diagnosis present

## 2023-11-13 DIAGNOSIS — N719 Inflammatory disease of uterus, unspecified: Secondary | ICD-10-CM | POA: Diagnosis not present

## 2023-11-13 LAB — CBC WITH DIFFERENTIAL/PLATELET
Abs Immature Granulocytes: 0 10*3/uL (ref 0.00–0.07)
Basophils Absolute: 0.8 10*3/uL — ABNORMAL HIGH (ref 0.0–0.1)
Basophils Relative: 2 %
Eosinophils Absolute: 0 10*3/uL (ref 0.0–0.5)
Eosinophils Relative: 0 %
HCT: 31.2 % — ABNORMAL LOW (ref 36.0–46.0)
Hemoglobin: 10.4 g/dL — ABNORMAL LOW (ref 12.0–15.0)
Lymphocytes Relative: 7 %
Lymphs Abs: 2.7 10*3/uL (ref 0.7–4.0)
MCH: 31.4 pg (ref 26.0–34.0)
MCHC: 33.3 g/dL (ref 30.0–36.0)
MCV: 94.3 fL (ref 80.0–100.0)
Monocytes Absolute: 1.5 10*3/uL — ABNORMAL HIGH (ref 0.1–1.0)
Monocytes Relative: 4 %
Neutro Abs: 33 10*3/uL — ABNORMAL HIGH (ref 1.7–7.7)
Neutrophils Relative %: 87 %
Platelets: 388 10*3/uL (ref 150–400)
RBC: 3.31 MIL/uL — ABNORMAL LOW (ref 3.87–5.11)
RDW: 12.9 % (ref 11.5–15.5)
WBC: 37.9 10*3/uL — ABNORMAL HIGH (ref 4.0–10.5)
nRBC: 0 % (ref 0.0–0.2)
nRBC: 0 /100{WBCs}

## 2023-11-13 LAB — IRON AND TIBC
Iron: 34 ug/dL (ref 28–170)
Saturation Ratios: 18 % (ref 10.4–31.8)
TIBC: 193 ug/dL — ABNORMAL LOW (ref 250–450)
UIBC: 159 ug/dL

## 2023-11-13 LAB — COMPREHENSIVE METABOLIC PANEL
ALT: 14 U/L (ref 0–44)
AST: 18 U/L (ref 15–41)
Albumin: 2.3 g/dL — ABNORMAL LOW (ref 3.5–5.0)
Alkaline Phosphatase: 68 U/L (ref 38–126)
Anion gap: 3 — ABNORMAL LOW (ref 5–15)
BUN: 11 mg/dL (ref 8–23)
CO2: 25 mmol/L (ref 22–32)
Calcium: 8.8 mg/dL — ABNORMAL LOW (ref 8.9–10.3)
Chloride: 109 mmol/L (ref 98–111)
Creatinine, Ser: 1.31 mg/dL — ABNORMAL HIGH (ref 0.44–1.00)
GFR, Estimated: 41 mL/min — ABNORMAL LOW (ref >60)
Glucose, Bld: 112 mg/dL — ABNORMAL HIGH (ref 70–99)
Potassium: 3.3 mmol/L — ABNORMAL LOW (ref 3.5–5.1)
Sodium: 137 mmol/L (ref 135–145)
Total Bilirubin: 1.2 mg/dL — ABNORMAL HIGH (ref <1.2)
Total Protein: 5.2 g/dL — ABNORMAL LOW (ref 6.5–8.1)

## 2023-11-13 LAB — TYPE AND SCREEN
ABO/RH(D): O POS
Antibody Screen: NEGATIVE

## 2023-11-13 LAB — WET PREP, GENITAL
Clue Cells Wet Prep HPF POC: NONE SEEN
Sperm: NONE SEEN
Trich, Wet Prep: NONE SEEN
WBC, Wet Prep HPF POC: <10 (ref <10)
Yeast Wet Prep HPF POC: NONE SEEN

## 2023-11-13 LAB — PROTIME-INR
INR: 1.3 — ABNORMAL HIGH (ref 0.8–1.2)
Prothrombin Time: 16.5 s — ABNORMAL HIGH (ref 11.4–15.2)

## 2023-11-13 LAB — ABO/RH: ABO/RH(D): O POS

## 2023-11-13 LAB — APTT: aPTT: 36 s (ref 24–36)

## 2023-11-13 LAB — FERRITIN: Ferritin: 374 ng/mL — ABNORMAL HIGH (ref 11–307)

## 2023-11-13 LAB — MAGNESIUM: Magnesium: 1.8 mg/dL (ref 1.7–2.4)

## 2023-11-13 MED ORDER — MOMETASONE FURO-FORMOTEROL FUM 100-5 MCG/ACT IN AERO
2.0000 | INHALATION_SPRAY | Freq: Two times a day (BID) | RESPIRATORY_TRACT | Status: DC
  Administered 2023-11-13 – 2023-11-28 (×29): 2 via RESPIRATORY_TRACT
  Filled 2023-11-13: qty 8.8

## 2023-11-13 MED ORDER — POTASSIUM CHLORIDE CRYS ER 20 MEQ PO TBCR
40.0000 meq | EXTENDED_RELEASE_TABLET | Freq: Once | ORAL | Status: AC
  Administered 2023-11-13: 40 meq via ORAL
  Filled 2023-11-13: qty 2

## 2023-11-13 MED ORDER — ROSUVASTATIN CALCIUM 20 MG PO TABS
20.0000 mg | ORAL_TABLET | Freq: Every day | ORAL | Status: DC
  Administered 2023-11-13 – 2023-11-28 (×15): 20 mg via ORAL
  Filled 2023-11-13 (×15): qty 1

## 2023-11-13 MED ORDER — LACTATED RINGERS IV SOLN: INTRAVENOUS | Status: AC

## 2023-11-13 MED ORDER — GADOBUTROL 1 MMOL/ML IV SOLN
8.0000 mL | Freq: Once | INTRAVENOUS | Status: AC | PRN
  Administered 2023-11-13: 8 mL via INTRAVENOUS

## 2023-11-13 MED ORDER — LATANOPROST 0.005 % OP SOLN
1.0000 [drp] | Freq: Every day | OPHTHALMIC | Status: DC
  Administered 2023-11-13 – 2023-11-27 (×16): 1 [drp] via OPHTHALMIC
  Filled 2023-11-13: qty 2.5

## 2023-11-13 MED ORDER — ALBUTEROL SULFATE (2.5 MG/3ML) 0.083% IN NEBU: 2.5000 mg | INHALATION_SOLUTION | RESPIRATORY_TRACT | Status: DC | PRN

## 2023-11-13 NOTE — Progress Notes (Addendum)
[facility]                                                                               [place], is a 82 y.o. female, DOB - [birth year], UKG:[id number] Admit date - 11/12/2023    Outpatient Primary MD for the patient is Myrlene Broker, MD  LOS - 1  days    Brief summary   Heather Grant is a 82 y.o. female with medical history significant for essential pretension, hyperlipidemia, moderate persistent asthma, who is admitted to [facility] on 11/12/2023 by way of transfer from [facility] with endometritis after presenting from home to to the latter facility complaining of lower abdominal discomfort.    Assessment & Plan    Assessment and Plan:   Lower abdominal pain Possibly endometriosis evident on the CT of the abdomen and pelvis. Patient was started on broad-spectrum IV antibiotics, continue the same GYN  on board, appreciate recommendations MRI of the pelvis ordered for further evaluation as there is suspicion for colo uterine fistula General Surgery consulted and recommendations given. Leukocytosis is improving, lactic acid is normal GC/chlamydia probe collected and sent for labs   Acute cystitis On IV ceftriaxone, follow urine and blood cultures.    Hypokalemia Replaced   Normocytic anemia Continue to follow Hemoglobin around 10.4 this morning   Essential hypertension Blood pressure parameters are optimal    Hyperlipidemia Patient on Crestor, continue the same   Asthma No wheezing heard on exam today Continue with Dulera twice daily    GERD Stable       Estimated body mass index is 33.97 kg/m as calculated from the following:   Height as of this encounter: 4\' 11"  (1.499 m).   Weight as of this encounter: 76.3 kg.  Code Status: Full code DVT Prophylaxis:  SCDs Start: 11/12/23 2110   Level of Care: Level of care: Med-Surg Family Communication: None at bedside  Disposition Plan:      Remains inpatient appropriate: IV antibiotics and MRI of the pelvis  Procedures:  None  Consultants:   GYN consult General surgery  Antimicrobials:   Anti-infectives (From admission, onward)    Start     Dose/Rate Route Frequency Ordered Stop   11/13/23 1200  cefTRIAXone (ROCEPHIN) 2 g in sodium chloride 0.9 % 100 mL IVPB        2 g 200 mL/hr over 30 Minutes Intravenous Every 24 hours 11/12/23 2111     11/13/23 0500  metroNIDAZOLE (FLAGYL) IVPB 500 mg        500 mg 100 mL/hr over 60 Minutes Intravenous Every 12 hours 11/12/23 2111     11/12/23 1715  metroNIDAZOLE (FLAGYL) IVPB 500 mg        500 mg 100 mL/hr over 60 Minutes Intravenous  Once 11/12/23 1713 11/12/23 1849   11/12/23 1615  cefTRIAXone (ROCEPHIN) 1 g in sodium chloride 0.9 % 100 mL IVPB        1 g 200 mL/hr over 30 Minutes Intravenous  Once 11/12/23 1605 11/12/23 1700        Medications  Scheduled Meds:  latanoprost  1 drop Both Eyes QHS   mometasone-formoterol  2 puff Inhalation BID   rosuvastatin  20 mg Oral Daily   Continuous Infusions:  cefTRIAXone (ROCEPHIN)  IV 2 g (11/13/23 1316)   lactated ringers 75 mL/hr at 11/13/23 1444   metronidazole 500 mg (11/13/23 1603)   PRN Meds:.acetaminophen **OR** acetaminophen, albuterol, fentaNYL (SUBLIMAZE) injection, melatonin, naLOXone (NARCAN)  injection, ondansetron (ZOFRAN) IV    Subjective:   Chantha [place] was seen and examined today.  Patient reports her abdominal pain has improved, no nausea or vomiting.  She continues to have some loose stools without any blood  Objective:   Vitals:   11/12/23 2251 11/13/23 0330 11/13/23 0758 11/13/23 0846  BP:  (!) 131/58 (!) 140/50   Pulse:  (!) 53 (!) 58   Resp:  16 16   Temp:  97.9 F (36.6 C) 98 F (36.7 C)   TempSrc:  Oral Oral   SpO2:  100% 100% 98%  Weight: 76.3 kg     Height:        Intake/Output Summary (Last 24 hours) at 11/13/2023 1608 Last data filed at 11/13/2023 0952 Gross per 24 hour   Intake 969.66 ml  Output --  Net 969.66 ml   Filed Weights   11/12/23 2251  Weight: 76.3 kg     Exam General exam: Appears calm and comfortable  Respiratory system: Clear to auscultation. Respiratory effort normal. Cardiovascular system: S1 & S2 heard, RRR.  Gastrointestinal system: Abdomen is soft mild lower quadrant tenderness, bowel sounds are good Central nervous system: Alert and oriented. No focal neurological deficits. Extremities: Symmetric 5 x 5 power. Skin: No rashes,  Psychiatry:  Mood & affect appropriate.     Data Reviewed:  I have personally reviewed following labs and imaging studies   CBC Lab Results  Component Value Date   WBC 37.9 (H) 11/13/2023   RBC 3.31 (L) 11/13/2023   HGB 10.4 (L) 11/13/2023   HCT 31.2 (L) 11/13/2023   MCV 94.3 11/13/2023   MCH 31.4 11/13/2023   PLT 388 11/13/2023   MCHC 33.3 11/13/2023   RDW 12.9 11/13/2023   LYMPHSABS 2.7 11/13/2023   MONOABS 1.5 (H) 11/13/2023   EOSABS 0.0 11/13/2023   BASOSABS 0.8 (H) 11/13/2023     Last metabolic panel Lab Results  Component Value Date   NA 137 11/13/2023   K 3.3 (L) 11/13/2023   CL 109 11/13/2023   CO2 25 11/13/2023   BUN 11 11/13/2023   CREATININE 1.31 (H) 11/13/2023   GLUCOSE 112 (H) 11/13/2023   GFRNONAA 41 (L) 11/13/2023   GFRAA 56 (L) 11/19/2012   CALCIUM 8.8 (L) 11/13/2023   PROT 5.2 (L) 11/13/2023   ALBUMIN 2.3 (L) 11/13/2023   BILITOT 1.2 (H) 11/13/2023   ALKPHOS 68 11/13/2023   AST 18 11/13/2023   ALT 14 11/13/2023   ANIONGAP 3 (L) 11/13/2023    CBG (last 3)  No results for input(s): "GLUCAP" in the last 72 hours.    Coagulation Profile: Recent Labs  Lab 11/13/23 0407  INR 1.3*     Radiology Studies: US PELVIC COMPLETE WITH TRANSVAGINAL Result Date: 11/12/2023 CLINICAL DATA:  Pelvic pain EXAM: TRANSABDOMINAL AND TRANSVAGINAL ULTRASOUND OF PELVIS TECHNIQUE: Both transabdominal and transvaginal ultrasound examinations of the pelvis were performed.  Transabdominal technique was performed for global imaging of the pelvis including uterus, ovaries, adnexal regions, and pelvic cul-de-sac. It was necessary to proceed with endovaginal exam following the transabdominal exam to visualize the adnexa. COMPARISON:  CT 11/12/2023 FINDINGS: Uterus Vaginal cuff grossly unremarkable. Poorly  visible uterus due to dirty shadowing from air. On endovaginal images and cine clips, large gas and fluid collection at the midline pelvis presumably corresponding to the gas and fluid collections seen on CT. Endometrium Not visualized, unable to measure. Right ovary Not seen. Left ovary Not seen Other findings No abnormal free fluid. IMPRESSION: Poorly visible uterus due to dirty shadowing from air. On endovaginal images and cine clips, large gas and fluid collection at the midline pelvis presumably corresponding to the gas and fluid collection on CT which appears to be intrauterine in location, concerning for intrauterine abscess. CT demonstrated potential colo uterine fistula as well. Nonvisualized ovaries Electronically Signed   By: Jasmine Pang M.D.   On: 11/12/2023 23:45   CT ABDOMEN PELVIS W CONTRAST Result Date: 11/12/2023 CLINICAL DATA:  Right lower quadrant abdominal pain EXAM: CT ABDOMEN AND PELVIS WITH CONTRAST TECHNIQUE: Multidetector CT imaging of the abdomen and pelvis was performed using the standard protocol following bolus administration of intravenous contrast. RADIATION DOSE REDUCTION: This exam was performed according to the departmental dose-optimization program which includes automated exposure control, adjustment of the mA and/or kV according to patient size and/or use of iterative reconstruction technique. CONTRAST:  OMNIPAQUE IOHEXOL 300 MG/ML  SOLN COMPARISON:  None Available. FINDINGS: Lower chest: No acute abnormality. Hepatobiliary: Unremarkable liver. Normal gallbladder. No biliary dilation. Pancreas: Unremarkable. Spleen: Unremarkable.  Adrenals/Urinary Tract: Normal adrenal glands. Low-attenuation lesions in the kidneys are statistically likely to represent cysts. No follow-up is required. No urinary calculi or hydronephrosis. Bladder is unremarkable. Stomach/Bowel: Normal caliber large and small bowel. Colonic diverticulosis without diverticulitis. No bowel wall thickening. The appendix is normal.Stomach is within normal limits. Vascular/Lymphatic: No significant vascular findings are present. No enlarged abdominal or pelvic lymph nodes. Reproductive: Frothy debris with air-fluid level within the dilated uterus. Mild fat stranding anterior to the uterus. Ovaries are unremarkable. Other: Question free intraperitoneal air and trace fluid between the posterior right uterus and the sigmoid colon (circa series 6/image 56-61). Musculoskeletal: No acute fracture. Multilevel thoracolumbar spondylosis IMPRESSION: 1. Frothy debris with air-fluid level within the uterus concerning for endometritis. Pelvic ultrasound and gynecology consult is recommended. 2. Question colo-uterine fistula between the sigmoid colon and posterior right uterus. These results were called by telephone at the time of interpretation on 11/12/2023 at 5:13 pm to provider Dr. Karene Fry, Who verbally acknowledged these results. Electronically Signed   By: Minerva Fester M.D.   On: 11/12/2023 17:14       Kathlen Mody M.D. [facility] 11/13/2023, 4:08 PM  Available via Epic secure chat 7am-7pm After 7 pm, please refer to night coverage provider listed on amion.

## 2023-11-13 NOTE — Consult Note (Signed)
Reason for Consult:possible postmenopausal endometritis  Referring Physician: Newton Pigg D.O. with [facility]   Heather Grant is an 82 y.o. female. She presented to [facility] yesterday with complaint of abdominal pain  mostly in the right lower quadrant that started 2-3 days ago. Yesterday the pain got worse. She rates the pain as 5 out of 10 when she presented to [facility].  She had an episode of fecal incontinence yesterday which prompted her to seek medical attention. She reports  decreased appetite for the last 2 weeks. She denies nausea or vomiting.  She endorses urinary frequency and "orange" urine the last week.  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   She reports feeling better than she did yesterday. Rates the pain as 1 out of 10 mostly in the Right lower quadrant. She denies any abnormal vaginal discharge or bleeding. She is not sexually active. She has not had sex in over 20 years. She denies any history of sexually transmitted diseases.     Past Medical History:  Diagnosis Date   Arthritis    hands and knees. May be hips   Gout    great toe, ankles   Hyperlipidemia    Hypertension    Migraines    Moderate persistent asthma    Nocturnal dyspnea    ONO 2010 with several readings  below 88%   Obesity, Class II, BMI 35-39.9    OSA (obstructive sleep apnea)    mild by study Dec '09. AHI 5.7   Plantar fasciitis    Varicella   Diverticulosis diagnosed on Colonoscopy in 2019.   Past Surgical History:  Procedure Laterality Date   CARPAL TUNNEL RELEASE     right   cataract     right eye with IOL   COLONOSCOPY WITH PROPOFOL N/A 04/26/2018   Procedure: COLONOSCOPY WITH PROPOFOL;  Surgeon: Sherrilyn Rist, MD;  Location: [facility];  Service: Gastroenterology;  Laterality: N/A;   DILATION AND CURETTAGE OF UTERUS     G5P4     NSVD    Family  History  Problem Relation Age of Onset   Hypertension Mother    Stroke Father    Lung cancer Sister        smoked   Asthma Son     Social History:  reports that she has never smoked. She has never used smokeless tobacco. She reports current alcohol use. She reports that she does not use drugs.  Allergies: No Known Allergies  Medications:   Prior to Admission:  Medications Prior to Admission  Medication Sig Dispense Refill Last Dose/Taking   albuterol (VENTOLIN HFA) 108 (90 Base) MCG/ACT inhaler USE 1 TO 2 INHALATIONS BY MOUTH  EVERY 6 HOURS AS NEEDED FOR  WHEEZING OR SHORTNESS OF BREATH 34 g 2 11/11/2023   Cholecalciferol 50 MCG (2000 UT) CAPS Vitamin D3 50 mcg (2,000 unit) capsule  Take 1 capsule every day by oral route as directed.   11/11/2023   colchicine 0.6 MG tablet TAKE 1 TABLET BY MOUTH DAILY FOR GOUT FLARE 100 tablet 2 11/11/2023   DULERA 100-5 MCG/ACT AERO USE 2 INHALATIONS BY MOUTH TWICE DAILY 39 g 3 11/11/2023   famotidine (PEPCID) 20 MG tablet TAKE 1 TABLET BY MOUTH DAILY AT  BEDTIME 100 tablet 2 Past Month   hydrALAZINE (APRESOLINE) 25 MG tablet TAKE 1 TABLET BY MOUTH 3 TIMES  DAILY 300 tablet 0 11/11/2023  LUMIGAN 0.01 % SOLN Place 1 drop into both eyes at bedtime.   11/11/2023   meloxicam (MOBIC) 15 MG tablet TAKE 1 TABLET BY MOUTH DAILY 100 tablet 2 Past Week   metoprolol tartrate (LOPRESSOR) 25 MG tablet TAKE 1 TABLET BY MOUTH TWICE  DAILY 200 tablet 2 11/11/2023 at  8:30 AM   nystatin cream (MYCOSTATIN) APPLY TO AFFECTED AREA TWICE A DAY   Past Month   OXYGEN Inhale 2 L into the lungs at bedtime.   11/11/2023   Polyvinyl Alcohol (LUBRICANT DROPS OP) Place 1 drop into both eyes daily.   11/11/2023   rosuvastatin (CRESTOR) 20 MG tablet TAKE 1 TABLET BY MOUTH DAILY 100 tablet 2 11/11/2023   VOLTAREN 1 % GEL Apply 2 g topically daily as needed (knee pain).    Past Week   Review of Systems  Constitutional:  Positive for malaise/fatigue. Negative for chills and  fever.  HENT: Negative.    Eyes: Negative.   Respiratory: Negative.    Cardiovascular: Negative.   Gastrointestinal:  Positive for diarrhea. Negative for blood in stool, constipation, nausea and vomiting.  Genitourinary:  Positive for frequency, hematuria and urgency. Negative for dysuria.  Musculoskeletal:  Positive for joint pain.  Skin: Negative.   Neurological:  Positive for weakness.  Endo/Heme/Allergies: Negative.   Psychiatric/Behavioral: Negative.      Blood pressure (!) 140/50, pulse (!) 58, temperature 98 F (36.7 C), temperature source Oral, resp. rate 16, height 4\' 11"  (1.499 m), weight 76.3 kg, SpO2 98%. Physical Exam Vitals reviewed.  Constitutional:      Appearance: She is well-developed.  HENT:     Head: Normocephalic and atraumatic.  Pulmonary:     Effort: Pulmonary effort is normal.  Abdominal:     General: There is no distension.     Palpations: Abdomen is soft.     Tenderness: There is abdominal tenderness in the right lower quadrant and suprapubic area. There is no guarding or rebound.  Genitourinary:    Vagina: Vaginal discharge present. No bleeding.     Uterus: Normal.      Adnexa: Left adnexa normal.       Right: Tenderness present.      Comments: Vaginal mucosa is atrophic. Cervix is atrophic. Small amount of yellow discharge in the vaginal vault. Concern for cervical stenosis. No cervical motion tenderness is appreciated on bimanual exam. Right adnexal tenderness is present.  Skin:    General: Skin is warm and dry.  Neurological:     General: No focal deficit present.     Mental Status: She is alert.  Psychiatric:        Mood and Affect: Mood normal.        Behavior: Behavior normal.       Results for orders placed or performed during the hospital encounter of 11/12/23 (from the past 48 hours)  Comprehensive metabolic panel     Status: Abnormal   Collection Time: 11/12/23  1:02 PM  Result Value Ref Range   Sodium 138 135 - 145 mmol/L    Potassium 3.6 3.5 - 5.1 mmol/L   Chloride 102 98 - 111 mmol/L   CO2 25 22 - 32 mmol/L   Glucose, Bld 106 (H) 70 - 99 mg/dL    Comment: Glucose reference range applies only to samples taken after fasting for at least 8 hours.   BUN 14 8 - 23 mg/dL   Creatinine, Ser 4.09 (H) 0.44 - 1.00 mg/dL   Calcium 9.6 8.9 -  10.3 mg/dL   Total Protein 6.5 6.5 - 8.1 g/dL   Albumin 3.5 3.5 - 5.0 g/dL   AST 17 15 - 41 U/L   ALT 11 0 - 44 U/L   Alkaline Phosphatase 73 38 - 126 U/L   Total Bilirubin 1.4 (H) <1.2 mg/dL   GFR, Estimated 42 (L) >60 mL/min    Comment: (NOTE) Calculated using the CKD-EPI Creatinine Equation (2021)    Anion gap 11 5 - 15    Comment: Performed at [facility], [address] [place], [place] [address]  Lipase, blood     Status: Abnormal   Collection Time: 11/12/23  1:02 PM  Result Value Ref Range   Lipase <10 (L) 11 - 51 U/L    Comment: Performed at [facility], [address] [place], [place] [address]  CBC with Differential     Status: Abnormal   Collection Time: 11/12/23  1:02 PM  Result Value Ref Range   WBC 45.2 (H) 4.0 - 10.5 K/uL   RBC 3.65 (L) 3.87 - 5.11 MIL/uL   Hemoglobin 11.2 (L) 12.0 - 15.0 g/dL   HCT 09.8 (L) 11.9 - 14.7 %   MCV 92.9 80.0 - 100.0 fL   MCH 30.7 26.0 - 34.0 pg   MCHC 33.0 30.0 - 36.0 g/dL   RDW 82.9 56.2 - 13.0 %   Platelets 409 (H) 150 - 400 K/uL   nRBC 0.0 0.0 - 0.2 %   Neutrophils Relative % 80 %   Neutro Abs 35.8 (H) 1.7 - 7.7 K/uL   Lymphocytes Relative 5 %   Lymphs Abs 2.3 0.7 - 4.0 K/uL   Monocytes Relative 8 %   Monocytes Absolute 3.6 (H) 0.1 - 1.0 K/uL   Eosinophils Relative 0 %   Eosinophils Absolute 0.0 0.0 - 0.5 K/uL   Basophils Relative 0 %   Basophils Absolute 0.2 (H) 0.0 - 0.1 K/uL   Immature Granulocytes 7 %   Abs Immature Granulocytes 3.25 (H) 0.00 - 0.07 K/uL    Comment: Performed at [facility], [address] [place], [place] [address]   Urinalysis, w/ Reflex to Culture (Infection Suspected) -Urine, Unspecified Source     Status: Abnormal   Collection Time: 11/12/23  1:02 PM  Result Value Ref Range   Specimen Source URINE, UNSPE    Color, Urine ORANGE (A) YELLOW    Comment: BIOCHEMICALS MAY BE AFFECTED BY COLOR   APPearance CLEAR CLEAR   Specific Gravity, Urine >1.046 (H) 1.005 - 1.030   pH 5.5 5.0 - 8.0   Glucose, UA NEGATIVE NEGATIVE mg/dL   Hgb urine dipstick TRACE (A) NEGATIVE   Bilirubin Urine NEGATIVE NEGATIVE   Ketones, ur NEGATIVE NEGATIVE mg/dL   Protein, ur 30 (A) NEGATIVE mg/dL   Nitrite POSITIVE (A) NEGATIVE   Leukocytes,Ua MODERATE (A) NEGATIVE   RBC / HPF 6-10 0 - 5 RBC/hpf   WBC, UA >50 0 - 5 WBC/hpf    Comment:        Reflex urine culture not performed if WBC <=10, OR if Squamous epithelial cells >5. If Squamous epithelial cells >5 suggest recollection.    Bacteria, UA MANY (A) NONE SEEN   Squamous Epithelial / HPF 0-5 0 - 5 /HPF   Mucus PRESENT    Hyaline Casts, UA PRESENT     Comment: Performed at [facility], [address] [place], [place] [address]  Lactic acid, plasma     Status: None  Collection Time: 11/12/23  5:30 PM  Result Value Ref Range   Lactic Acid, Venous 1.0 0.5 - 1.9 mmol/L    Comment: Performed at [facility], [address] [place], [place] [address]  Type and screen [facility]     Status: None   Collection Time: 11/13/23  4:00 AM  Result Value Ref Range   ABO/RH(D) O POS    Antibody Screen NEG    Sample Expiration      11/16/2023,2359 Performed at [facility], [address], [place], [place] [address]   CBC with Differential/Platelet     Status: Abnormal   Collection Time: 11/13/23  4:07 AM  Result Value Ref Range   WBC 37.9 (H) 4.0 - 10.5 K/uL   RBC 3.31 (L) 3.87 - 5.11 MIL/uL   Hemoglobin 10.4 (L) 12.0 - 15.0 g/dL   HCT 96.2 (L) 95.2 - 84.1 %   MCV 94.3 80.0 - 100.0 fL   MCH 31.4 26.0 -  34.0 pg   MCHC 33.3 30.0 - 36.0 g/dL   RDW 32.4 40.1 - 02.7 %   Platelets 388 150 - 400 K/uL    Comment: REPEATED TO VERIFY   nRBC 0.0 0.0 - 0.2 %   Neutrophils Relative % 87 %   Neutro Abs 33.0 (H) 1.7 - 7.7 K/uL   Lymphocytes Relative 7 %   Lymphs Abs 2.7 0.7 - 4.0 K/uL   Monocytes Relative 4 %   Monocytes Absolute 1.5 (H) 0.1 - 1.0 K/uL   Eosinophils Relative 0 %   Eosinophils Absolute 0.0 0.0 - 0.5 K/uL   Basophils Relative 2 %   Basophils Absolute 0.8 (H) 0.0 - 0.1 K/uL   WBC Morphology See Note     Comment: Increased Bands. >20% Bands   nRBC 0 0 /100 WBC   Abs Immature Granulocytes 0.00 0.00 - 0.07 K/uL    Comment: Performed at [facility], [address] [place], [place] [address]  Comprehensive metabolic panel     Status: Abnormal   Collection Time: 11/13/23  4:07 AM  Result Value Ref Range   Sodium 137 135 - 145 mmol/L   Potassium 3.3 (L) 3.5 - 5.1 mmol/L   Chloride 109 98 - 111 mmol/L   CO2 25 22 - 32 mmol/L   Glucose, Bld 112 (H) 70 - 99 mg/dL    Comment: Glucose reference range applies only to samples taken after fasting for at least 8 hours.   BUN 11 8 - 23 mg/dL   Creatinine, Ser 4.40 (H) 0.44 - 1.00 mg/dL   Calcium 8.8 (L) 8.9 - 10.3 mg/dL   Total Protein 5.2 (L) 6.5 - 8.1 g/dL   Albumin 2.3 (L) 3.5 - 5.0 g/dL   AST 18 15 - 41 U/L   ALT 14 0 - 44 U/L   Alkaline Phosphatase 68 38 - 126 U/L   Total Bilirubin 1.2 (H) <1.2 mg/dL   GFR, Estimated 41 (L) >60 mL/min    Comment: (NOTE) Calculated using the CKD-EPI Creatinine Equation (2021)    Anion gap 3 (L) 5 - 15    Comment: Performed at [facility], [address] [place], [place] [address]  Magnesium     Status: None   Collection Time: 11/13/23  4:07 AM  Result Value Ref Range   Magnesium 1.8 1.7 - 2.4 mg/dL    Comment: Performed at [facility], [address] [place], [place] [address]  APTT     Status: None   Collection Time: 11/13/23  4:07 AM  Result Value Ref Range   aPTT 36  24 - 36 seconds    Comment: Performed at [facility], [address] [place], [place] [address]  Protime-INR     Status: Abnormal   Collection Time: 11/13/23  4:07 AM  Result Value Ref Range   Prothrombin Time 16.5 (H) 11.4 - 15.2 seconds   INR 1.3 (H) 0.8 - 1.2    Comment: (NOTE) INR goal varies based on device and disease states. Performed at [facility], [address] [place], [place] [address]   Ferritin     Status: Abnormal   Collection Time: 11/13/23  4:07 AM  Result Value Ref Range   Ferritin 374 (H) 11 - 307 ng/mL    Comment: Performed at [facility], [address], [place], [place] [address]  Iron and TIBC     Status: Abnormal   Collection Time: 11/13/23  4:07 AM  Result Value Ref Range   Iron 34 28 - 170 ug/dL   TIBC 063 (L) 016 - 010 ug/dL   Saturation Ratios 18 10.4 - 31.8 %   UIBC 159 ug/dL    Comment: Performed at [facility], [address], [place], [place] [address]  ABO/Rh     Status: None   Collection Time: 11/13/23  5:40 AM  Result Value Ref Range   ABO/RH(D)      O POS Performed at [facility], [address], [place], [place] [address]     US PELVIC COMPLETE WITH TRANSVAGINAL Result Date: 11/12/2023 CLINICAL DATA:  Pelvic pain EXAM: TRANSABDOMINAL AND TRANSVAGINAL ULTRASOUND OF PELVIS TECHNIQUE: Both transabdominal and transvaginal ultrasound examinations of the pelvis were performed. Transabdominal technique was performed for global imaging of the pelvis including uterus, ovaries, adnexal regions, and pelvic cul-de-sac. It was necessary to proceed with endovaginal exam following the transabdominal exam to visualize the adnexa. COMPARISON:  CT 11/12/2023 FINDINGS: Uterus Vaginal cuff grossly unremarkable. Poorly visible uterus due to dirty shadowing from air. On endovaginal images and cine clips, large gas and fluid collection at the midline pelvis presumably corresponding to the gas and fluid collections seen on CT.  Endometrium Not visualized, unable to measure. Right ovary Not seen. Left ovary Not seen Other findings No abnormal free fluid. IMPRESSION: Poorly visible uterus due to dirty shadowing from air. On endovaginal images and cine clips, large gas and fluid collection at the midline pelvis presumably corresponding to the gas and fluid collection on CT which appears to be intrauterine in location, concerning for intrauterine abscess. CT demonstrated potential colo uterine fistula as well. Nonvisualized ovaries Electronically Signed   By: Jasmine Pang M.D.   On: 11/12/2023 23:45   CT ABDOMEN PELVIS W CONTRAST Result Date: 11/12/2023 CLINICAL DATA:  Right lower quadrant abdominal pain EXAM: CT ABDOMEN AND PELVIS WITH CONTRAST TECHNIQUE: Multidetector CT imaging of the abdomen and pelvis was performed using the standard protocol following bolus administration of intravenous contrast. RADIATION DOSE REDUCTION: This exam was performed according to the departmental dose-optimization program which includes automated exposure control, adjustment of the mA and/or kV according to patient size and/or use of iterative reconstruction technique. CONTRAST:  OMNIPAQUE IOHEXOL 300 MG/ML  SOLN COMPARISON:  None Available. FINDINGS: Lower chest: No acute abnormality. Hepatobiliary: Unremarkable liver. Normal gallbladder. No biliary dilation. Pancreas: Unremarkable. Spleen: Unremarkable. Adrenals/Urinary Tract: Normal adrenal glands. Low-attenuation lesions in  the kidneys are statistically likely to represent cysts. No follow-up is required. No urinary calculi or hydronephrosis. Bladder is unremarkable. Stomach/Bowel: Normal caliber large and small bowel. Colonic diverticulosis without diverticulitis. No bowel wall thickening. The appendix is normal.Stomach is within normal limits. Vascular/Lymphatic: No significant vascular findings are present. No enlarged abdominal or pelvic lymph nodes. Reproductive: Frothy debris with  air-fluid level within the dilated uterus. Mild fat stranding anterior to the uterus. Ovaries are unremarkable. Other: Question free intraperitoneal air and trace fluid between the posterior right uterus and the sigmoid colon (circa series 6/image 56-61). Musculoskeletal: No acute fracture. Multilevel thoracolumbar spondylosis IMPRESSION: 1. Frothy debris with air-fluid level within the uterus concerning for endometritis. Pelvic ultrasound and gynecology consult is recommended. 2. Question colo-uterine fistula between the sigmoid colon and posterior right uterus. These results were called by telephone at the time of interpretation on 11/12/2023 at 5:13 pm to provider Dr. Karene Fry, Who verbally acknowledged these results. Electronically Signed   By: Minerva Fester M.D.   On: 11/12/2023 17:14    Assessment/Plan: 1) Leukocytosis- improving with Rocephin and Flagyl continue iv antibiotics repeat CBC in AM . The etiology may be multi-factorial Acute cystitis/ endometritis possible Diverticulitis with resulting Colo-uterine fistula . Follow Urine and Blood cultures.   2) suspected  Endometritis- based on pelvic pain and CT findings. Continue Current antibiotic regimen as patient is improving clinically and Leukocytosis is improving.  Pelvic ultrasound was not helpful with evaluation of uterus. On exam no purulent discharge is noted and no CMT. Cervical os appears stenotic which may inhibit Drainage of discharge from the uterus. GC/ Chl probe collected as well as wet mount. Will follow results.  Recommend MRI of the Pelvis without and with contrast to further evaluate the uterus and possible colo-uterine fistula  3) possible Colo-uterine fistula- Patient has a history of diverticulosis. It is possible this developed in to diverticulitis increasing risk of fistula. Given Air fluid level seen on CT scan this is suspicious for Colo-Uterine fistula Recommend MRI of the Pelvis without and with contrast to further  evaluate the uterus and possible colo-uterine fistula.. Also Recommend General surgery consult.   4) Acute Cystitis-Continue Rocephin and follow urine culture.  Recommendations were relayed to Dr.  Maurine Simmering M.D. 11/13/2023 Cell number [phone]

## 2023-11-13 NOTE — Progress Notes (Addendum)
Notified of this patients admission for abdominal pain, possible endometriosis, cystitis, possible colouterine fistula. Noted history of colonoscopy in 2019 significant for left sided diverticulosis, no masses.  Typically our colorectal team evaluates CV and colouterine fistula using a CT scan with oral contrast. If OBGYN needs MRI for surgical planning then would proceed with that but, from our perspective, can be assessed with CT.   No emergent role for fistula fixation. In the setting of endometriosis our colorectal team would want GYN or GYN oncology follow up for surgical planning.  Please notify our team when this patient is closer to discharge and we can provided outpatient colorectal follow up.    Hosie Spangle, PA-C [facility] Please see Amion for pager number during day hours 7:00am-4:30pm

## 2023-11-14 DIAGNOSIS — N719 Inflammatory disease of uterus, unspecified: Secondary | ICD-10-CM | POA: Diagnosis not present

## 2023-11-14 DIAGNOSIS — D72829 Elevated white blood cell count, unspecified: Secondary | ICD-10-CM | POA: Diagnosis not present

## 2023-11-14 DIAGNOSIS — N3000 Acute cystitis without hematuria: Secondary | ICD-10-CM | POA: Diagnosis not present

## 2023-11-14 DIAGNOSIS — D649 Anemia, unspecified: Secondary | ICD-10-CM | POA: Diagnosis not present

## 2023-11-14 LAB — CBC WITH DIFFERENTIAL/PLATELET
Abs Immature Granulocytes: 0 10*3/uL (ref 0.00–0.07)
Basophils Absolute: 0 10*3/uL (ref 0.0–0.1)
Basophils Relative: 0 %
Eosinophils Absolute: 0 10*3/uL (ref 0.0–0.5)
Eosinophils Relative: 0 %
HCT: 30 % — ABNORMAL LOW (ref 36.0–46.0)
Hemoglobin: 9.7 g/dL — ABNORMAL LOW (ref 12.0–15.0)
Lymphocytes Relative: 6 %
Lymphs Abs: 2.3 10*3/uL (ref 0.7–4.0)
MCH: 30.8 pg (ref 26.0–34.0)
MCHC: 32.3 g/dL (ref 30.0–36.0)
MCV: 95.2 fL (ref 80.0–100.0)
Monocytes Absolute: 2.3 10*3/uL — ABNORMAL HIGH (ref 0.1–1.0)
Monocytes Relative: 6 %
Neutro Abs: 33.5 10*3/uL — ABNORMAL HIGH (ref 1.7–7.7)
Neutrophils Relative %: 88 %
Platelets: 408 10*3/uL — ABNORMAL HIGH (ref 150–400)
RBC: 3.15 MIL/uL — ABNORMAL LOW (ref 3.87–5.11)
RDW: 13.1 % (ref 11.5–15.5)
Smear Review: NORMAL
WBC: 38.1 10*3/uL — ABNORMAL HIGH (ref 4.0–10.5)
nRBC: 0 % (ref 0.0–0.2)

## 2023-11-14 LAB — COMPREHENSIVE METABOLIC PANEL
ALT: 15 U/L (ref 0–44)
AST: 18 U/L (ref 15–41)
Albumin: 2.1 g/dL — ABNORMAL LOW (ref 3.5–5.0)
Alkaline Phosphatase: 81 U/L (ref 38–126)
Anion gap: 6 (ref 5–15)
BUN: 7 mg/dL — ABNORMAL LOW (ref 8–23)
CO2: 26 mmol/L (ref 22–32)
Calcium: 9.1 mg/dL (ref 8.9–10.3)
Chloride: 109 mmol/L (ref 98–111)
Creatinine, Ser: 1.45 mg/dL — ABNORMAL HIGH (ref 0.44–1.00)
GFR, Estimated: 36 mL/min — ABNORMAL LOW (ref >60)
Glucose, Bld: 103 mg/dL — ABNORMAL HIGH (ref 70–99)
Potassium: 3.7 mmol/L (ref 3.5–5.1)
Sodium: 141 mmol/L (ref 135–145)
Total Bilirubin: 0.9 mg/dL (ref <1.2)
Total Protein: 5 g/dL — ABNORMAL LOW (ref 6.5–8.1)

## 2023-11-14 LAB — SURGICAL PCR SCREEN
MRSA, PCR: NEGATIVE
Staphylococcus aureus: NEGATIVE

## 2023-11-14 MED ORDER — METRONIDAZOLE 500 MG PO TABS: 500.0000 mg | ORAL_TABLET | Freq: Two times a day (BID) | ORAL | Status: DC

## 2023-11-14 MED ORDER — METRONIDAZOLE 500 MG/100ML IV SOLN
500.0000 mg | Freq: Two times a day (BID) | INTRAVENOUS | Status: DC
  Administered 2023-11-14 – 2023-11-16 (×4): 500 mg via INTRAVENOUS
  Filled 2023-11-14 (×4): qty 100

## 2023-11-14 MED ORDER — SODIUM CHLORIDE 0.9 % IV SOLN
2.0000 g | INTRAVENOUS | Status: DC
  Administered 2023-11-14: 2 g via INTRAVENOUS
  Filled 2023-11-14: qty 12.5

## 2023-11-14 MED ORDER — DEXTROSE-SODIUM CHLORIDE 5-0.9 % IV SOLN: INTRAVENOUS | Status: AC

## 2023-11-14 NOTE — Progress Notes (Signed)
Pharmacy Antibiotic Note  Heather Grant is a 82 y.o. female admitted on 11/12/2023 with Uterine abscess and colo-uterine Fistula .  Pharmacy has been consulted for Zosyn dosing.  Patient's Scr continues to rise. Have recommended to primary MD and Gyn MD to use cefepime/Flagyl combination rather than zosyn.   Plan: D/C Zosyn Cefepime 2gm IV q24h (Adjusted for CrCl ~27 ml/min) Flagyl 500mg  IV q12h Monitor Scr as continues to rise Monitor for deescalation  Height: 4\' 11"  (149.9 cm) Weight: 76.5 kg (168 lb 10.4 oz) IBW/kg (Calculated) : 43.2  Temp (24hrs), Avg:98.4 F (36.9 C), Min:97.8 F (36.6 C), Max:98.7 F (37.1 C)  Recent Labs  Lab 11/12/23 1302 11/12/23 1730 11/13/23 0407 11/14/23 0539  WBC 45.2*  --  37.9* 38.1*  CREATININE 1.29*  --  1.31* 1.45*  LATICACIDVEN  --  1.0  --   --     Estimated Creatinine Clearance: 27.6 mL/min (A) (by C-G formula based on SCr of 1.45 mg/dL (H)).    No Known Allergies  Antimicrobials this admission: 12/13 Ceftriaxone >> 12/15 12/13 Flagyl >>  12/15 Cefepime >>  Dose adjustments this admission: N/A  Microbiology results: 12/13 BCx: NGTD   Heather Grant A. Jeanella Craze, PharmD, BCPS, FNKF Clinical Pharmacist [place] Please utilize Amion for appropriate phone number to reach the unit pharmacist [facility])  11/14/2023 1:30 PM

## 2023-11-14 NOTE — Consult Note (Signed)
GYN  Consult Follow up Note   Reason for Consult: Colo-uterine fitula with resulting Uterine abscess  Referring Physician: Dr. Newton Pigg   Heather Grant is an 82 y.o. 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 She is on Day 2 of IV Rocephin 2 grams and IV flagyl 500 mg bid. She denies pelvic pain. She reports pain if she palpates her lower abdomen localized to the right lower quadrant. She has decreased appetite. No nausea or emesis.  She denies abnormal vaginal discharge o bleeding.   Review of Systems  Blood pressure (!) 102/40, pulse (!) 55, temperature 97.8 F (36.6 C), temperature source Oral, resp. rate 15, height 4\' 11"  (1.499 m), weight 76.5 kg, SpO2 94%. Physical Exam Vitals reviewed.  Constitutional:      Appearance: She is well-developed.  HENT:     Head: Normocephalic and atraumatic.  Pulmonary:     Effort: Pulmonary effort is normal.  Abdominal:     General: Abdomen is flat. There is no distension.     Tenderness: There is abdominal tenderness in the right lower quadrant. There is no guarding or rebound.  Skin:    General: Skin is warm and dry.  Neurological:     General: No focal deficit present.     Mental Status: She is alert and oriented to person, place, and time.  Psychiatric:        Mood and Affect: Mood normal.        Behavior: Behavior normal.     Results for orders placed or performed during the hospital encounter of 11/12/23 (from the past 48 hours)  Comprehensive metabolic panel     Status: Abnormal   Collection Time: 11/12/23  1:02 PM  Result Value Ref Range   Sodium 138 135 - 145 mmol/L   Potassium 3.6 3.5 - 5.1 mmol/L   Chloride 102 98 - 111 mmol/L   CO2 25 22 - 32 mmol/L   Glucose, Bld 106 (H) 70 - 99 mg/dL    Comment: Glucose reference range applies only to samples taken after fasting for at least 8 hours.   BUN 14 8 - 23 mg/dL   Creatinine, Ser 1.61 (H) 0.44  - 1.00 mg/dL   Calcium 9.6 8.9 - 09.6 mg/dL   Total Protein 6.5 6.5 - 8.1 g/dL   Albumin 3.5 3.5 - 5.0 g/dL   AST 17 15 - 41 U/L   ALT 11 0 - 44 U/L   Alkaline Phosphatase 73 38 - 126 U/L   Total Bilirubin 1.4 (H) <1.2 mg/dL   GFR, Estimated 42 (L) >60 mL/min    Comment: (NOTE) Calculated using the CKD-EPI Creatinine Equation (2021)    Anion gap 11 5 - 15    Comment: Performed at [facility], [address] [place], [place] [address]  Lipase, blood     Status: Abnormal   Collection Time: 11/12/23  1:02 PM  Result Value Ref Range   Lipase <10 (L) 11 - 51 U/L    Comment: Performed at [facility], [address] [place], [place] [address]  CBC with Differential     Status: Abnormal   Collection Time: 11/12/23  1:02 PM  Result Value Ref Range   WBC 45.2 (H) 4.0 - 10.5 K/uL   RBC 3.65 (L) 3.87 - 5.11 MIL/uL   Hemoglobin 11.2 (L) 12.0 - 15.0 g/dL   HCT 14.7 (L) 82.9 - 56.2 %   MCV 92.9  80.0 - 100.0 fL   MCH 30.7 26.0 - 34.0 pg   MCHC 33.0 30.0 - 36.0 g/dL   RDW 16.1 09.6 - 04.5 %   Platelets 409 (H) 150 - 400 K/uL   nRBC 0.0 0.0 - 0.2 %   Neutrophils Relative % 80 %   Neutro Abs 35.8 (H) 1.7 - 7.7 K/uL   Lymphocytes Relative 5 %   Lymphs Abs 2.3 0.7 - 4.0 K/uL   Monocytes Relative 8 %   Monocytes Absolute 3.6 (H) 0.1 - 1.0 K/uL   Eosinophils Relative 0 %   Eosinophils Absolute 0.0 0.0 - 0.5 K/uL   Basophils Relative 0 %   Basophils Absolute 0.2 (H) 0.0 - 0.1 K/uL   Immature Granulocytes 7 %   Abs Immature Granulocytes 3.25 (H) 0.00 - 0.07 K/uL    Comment: Performed at [facility], [address], [place], [place] [address]  Urinalysis, w/ Reflex to Culture (Infection Suspected) -Urine, Unspecified Source     Status: Abnormal   Collection Time: 11/12/23  1:02 PM  Result Value Ref Range   Specimen Source URINE, UNSPE    Color, Urine ORANGE (A) YELLOW    Comment: BIOCHEMICALS MAY BE AFFECTED BY COLOR    APPearance CLEAR CLEAR   Specific Gravity, Urine >1.046 (H) 1.005 - 1.030   pH 5.5 5.0 - 8.0   Glucose, UA NEGATIVE NEGATIVE mg/dL   Hgb urine dipstick TRACE (A) NEGATIVE   Bilirubin Urine NEGATIVE NEGATIVE   Ketones, ur NEGATIVE NEGATIVE mg/dL   Protein, ur 30 (A) NEGATIVE mg/dL   Nitrite POSITIVE (A) NEGATIVE   Leukocytes,Ua MODERATE (A) NEGATIVE   RBC / HPF 6-10 0 - 5 RBC/hpf   WBC, UA >50 0 - 5 WBC/hpf    Comment:        Reflex urine culture not performed if WBC <=10, OR if Squamous epithelial cells >5. If Squamous epithelial cells >5 suggest recollection.    Bacteria, UA MANY (A) NONE SEEN   Squamous Epithelial / HPF 0-5 0 - 5 /HPF   Mucus PRESENT    Hyaline Casts, UA PRESENT     Comment: Performed at [facility], [address], [place], [place] [address]  Blood culture (routine x 2)     Status: None (Preliminary result)   Collection Time: 11/12/23  5:15 PM   Specimen: BLOOD  Result Value Ref Range   Specimen Description      BLOOD RIGHT ANTECUBITAL Performed at [facility], [address] [place], [place] [address]    Special Requests      Blood Culture results may not be optimal due to an inadequate volume of blood received in culture bottles BOTTLES DRAWN AEROBIC AND ANAEROBIC Performed at [facility], [address], [place], [place] [address]    Culture      NO GROWTH 2 DAYS Performed at [facility], [address], [place], [place] [address]    Report Status PENDING   Lactic acid, plasma     Status: None   Collection Time: 11/12/23  5:30 PM  Result Value Ref Range   Lactic Acid, Venous 1.0 0.5 - 1.9 mmol/L    Comment: Performed at [facility], [address] [place], [place] [address]  Type and screen [facility]     Status: None   Collection Time: 11/13/23  4:00 AM  Result Value Ref Range   ABO/RH(D) O POS    Antibody Screen  NEG    Sample  Expiration      11/16/2023,2359 Performed at [facility], [address] [place], [place] [address]   CBC with Differential/Platelet     Status: Abnormal   Collection Time: 11/13/23  4:07 AM  Result Value Ref Range   WBC 37.9 (H) 4.0 - 10.5 K/uL   RBC 3.31 (L) 3.87 - 5.11 MIL/uL   Hemoglobin 10.4 (L) 12.0 - 15.0 g/dL   HCT 60.4 (L) 54.0 - 98.1 %   MCV 94.3 80.0 - 100.0 fL   MCH 31.4 26.0 - 34.0 pg   MCHC 33.3 30.0 - 36.0 g/dL   RDW 19.1 47.8 - 29.5 %   Platelets 388 150 - 400 K/uL    Comment: REPEATED TO VERIFY   nRBC 0.0 0.0 - 0.2 %   Neutrophils Relative % 87 %   Neutro Abs 33.0 (H) 1.7 - 7.7 K/uL   Lymphocytes Relative 7 %   Lymphs Abs 2.7 0.7 - 4.0 K/uL   Monocytes Relative 4 %   Monocytes Absolute 1.5 (H) 0.1 - 1.0 K/uL   Eosinophils Relative 0 %   Eosinophils Absolute 0.0 0.0 - 0.5 K/uL   Basophils Relative 2 %   Basophils Absolute 0.8 (H) 0.0 - 0.1 K/uL   WBC Morphology See Note     Comment: Increased Bands. >20% Bands   nRBC 0 0 /100 WBC   Abs Immature Granulocytes 0.00 0.00 - 0.07 K/uL    Comment: Performed at [facility], [address], [place], [place] [address]  Comprehensive metabolic panel     Status: Abnormal   Collection Time: 11/13/23  4:07 AM  Result Value Ref Range   Sodium 137 135 - 145 mmol/L   Potassium 3.3 (L) 3.5 - 5.1 mmol/L   Chloride 109 98 - 111 mmol/L   CO2 25 22 - 32 mmol/L   Glucose, Bld 112 (H) 70 - 99 mg/dL    Comment: Glucose reference range applies only to samples taken after fasting for at least 8 hours.   BUN 11 8 - 23 mg/dL   Creatinine, Ser 8.65 (H) 0.44 - 1.00 mg/dL   Calcium 8.8 (L) 8.9 - 10.3 mg/dL   Total Protein 5.2 (L) 6.5 - 8.1 g/dL   Albumin 2.3 (L) 3.5 - 5.0 g/dL   AST 18 15 - 41 U/L   ALT 14 0 - 44 U/L   Alkaline Phosphatase 68 38 - 126 U/L   Total Bilirubin 1.2 (H) <1.2 mg/dL   GFR, Estimated 41 (L) >60 mL/min    Comment: (NOTE) Calculated using the CKD-EPI Creatinine Equation (2021)    Anion gap  3 (L) 5 - 15    Comment: Performed at [facility], [address], [place], [place] [address]  Magnesium     Status: None   Collection Time: 11/13/23  4:07 AM  Result Value Ref Range   Magnesium 1.8 1.7 - 2.4 mg/dL    Comment: Performed at [facility], [address] [place], [place] [address]  APTT     Status: None   Collection Time: 11/13/23  4:07 AM  Result Value Ref Range   aPTT 36 24 - 36 seconds    Comment: Performed at [facility], [address] [place], [place] [address]  Protime-INR     Status: Abnormal   Collection Time: 11/13/23  4:07 AM  Result Value Ref Range   Prothrombin Time 16.5 (H)  11.4 - 15.2 seconds   INR 1.3 (H) 0.8 - 1.2    Comment: (NOTE) INR goal varies based on device and disease states. Performed at [facility], [address] [place], [place] [address]   Ferritin     Status: Abnormal   Collection Time: 11/13/23  4:07 AM  Result Value Ref Range   Ferritin 374 (H) 11 - 307 ng/mL    Comment: Performed at [facility], [address], [place], [place] [address]  Iron and TIBC     Status: Abnormal   Collection Time: 11/13/23  4:07 AM  Result Value Ref Range   Iron 34 28 - 170 ug/dL   TIBC 696 (L) 295 - 284 ug/dL   Saturation Ratios 18 10.4 - 31.8 %   UIBC 159 ug/dL    Comment: Performed at [facility], [address] [place], [place] [address]  ABO/Rh     Status: None   Collection Time: 11/13/23  5:40 AM  Result Value Ref Range   ABO/RH(D)      O POS Performed at [facility], [address], [place], [place] [address]   Wet prep, genital     Status: None   Collection Time: 11/13/23  9:24 AM   Specimen: PATH Cytology Cervicovaginal Ancillary Only  Result Value Ref Range   Yeast Wet Prep HPF POC NONE SEEN NONE SEEN   Trich, Wet Prep NONE SEEN NONE SEEN   Clue Cells Wet Prep HPF POC NONE SEEN NONE SEEN   WBC, Wet Prep HPF POC <10 <10   Sperm NONE SEEN     Comment: Performed at [facility], [address] [place], [place] [address]  CBC with Differential/Platelet     Status: Abnormal   Collection Time: 11/14/23  5:39 AM  Result Value Ref Range   WBC 38.1 (H) 4.0 - 10.5 K/uL   RBC 3.15 (L) 3.87 - 5.11 MIL/uL   Hemoglobin 9.7 (L) 12.0 - 15.0 g/dL   HCT 44.0 (L) 34.7 - 42.5 %   MCV 95.2 80.0 - 100.0 fL   MCH 30.8 26.0 - 34.0 pg   MCHC 32.3 30.0 - 36.0 g/dL   RDW 95.6 38.7 - 56.4 %   Platelets 408 (H) 150 - 400 K/uL    Comment: REPEATED TO VERIFY   nRBC 0.0 0.0 - 0.2 %   Neutrophils Relative % 88 %   Neutro Abs 33.5 (H) 1.7 - 7.7 K/uL   Lymphocytes Relative 6 %   Lymphs Abs 2.3 0.7 - 4.0 K/uL   Monocytes Relative 6 %   Monocytes Absolute 2.3 (H) 0.1 - 1.0 K/uL   Eosinophils Relative 0 %   Eosinophils Absolute 0.0 0.0 - 0.5 K/uL   Basophils Relative 0 %   Basophils Absolute 0.0 0.0 - 0.1 K/uL   WBC Morphology MORPHOLOGY UNREMARKABLE    RBC Morphology MORPHOLOGY UNREMARKABLE    Smear Review Normal platelet morphology    Abs Immature Granulocytes 0.00 0.00 - 0.07 K/uL    Comment: Performed at [facility], [address] [place], [place] [address]  Comprehensive metabolic panel     Status: Abnormal   Collection Time: 11/14/23  5:39 AM  Result Value Ref Range   Sodium 141 135 - 145 mmol/L   Potassium 3.7 3.5 - 5.1 mmol/L   Chloride 109 98 - 111 mmol/L   CO2 26 22 - 32 mmol/L   Glucose, Bld  103 (H) 70 - 99 mg/dL    Comment: Glucose reference range applies only to samples taken after fasting for at least 8 hours.   BUN 7 (L) 8 - 23 mg/dL   Creatinine, Ser 8.41 (H) 0.44 - 1.00 mg/dL   Calcium 9.1 8.9 - 32.4 mg/dL   Total Protein 5.0 (L) 6.5 - 8.1 g/dL   Albumin 2.1 (L) 3.5 - 5.0 g/dL   AST 18 15 - 41 U/L   ALT 15 0 - 44 U/L   Alkaline Phosphatase 81 38 - 126 U/L   Total Bilirubin 0.9 <1.2 mg/dL   GFR, Estimated 36 (L) >60 mL/min    Comment: (NOTE) Calculated using the CKD-EPI Creatinine Equation (2021)    Anion gap 6 5 - 15    Comment:  Performed at [facility], [address], [place], [place] [address]    MR PELVIS W WO CONTRAST Result Date: 11/13/2023 CLINICAL DATA:  Inpatient. Concern for colo-uterine fistula on recent CT. EXAM: MRI PELVIS WITHOUT AND WITH CONTRAST TECHNIQUE: Multiplanar multisequence MR imaging of the pelvis was performed both before and after administration of intravenous contrast. CONTRAST:  8mL GADAVIST GADOBUTROL 1 MMOL/ML IV SOLN COMPARISON:  11/12/2023 pelvic ultrasound and CT abdomen/pelvis. FINDINGS: Urinary Tract:  Collapsed normal bladder.  Normal urethra. Bowel: Moderate sigmoid diverticulosis. 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 No significant bowel wall thickening or acute pericolonic inflammatory changes. Vascular/Lymphatic: No pathologically enlarged lymph nodes in the pelvis. No acute vascular abnormality. Reproductive: Uterus: The enlarged anteverted uterus measures 11.7 x 8.1 x 9.0 cm. No uterine fibroids. The uterine cavity is markedly distended (6.6 cm AP diameter) by heterogeneous fluid with layering air anteriorly. Normal uterine cervix with intact cervical fibrous stroma. Ovaries and Adnexa: The right ovary measures 1.5 x 1.2 x 1.0 cm and is unremarkable, with a subcentimeter simple cyst. The left ovary measures 1.5 x 1.0 x 0.9 cm and is normal. There are no suspicious ovarian or adnexal masses. Other: Trace simple free fluid in the pelvic cul-de-sac. No focal extra uterine fluid collection. Musculoskeletal: No aggressive appearing focal osseous lesions. Marked lower lumbar degenerative disc disease. IMPRESSION: 1. Moderate sigmoid diverticulosis. The right posterior uterine body wall appears densely adherent with the distal sigmoid colon, with a suspected short 1 cm length  colo-uterine fistula in this location. 2. Markedly distended uterine cavity by heterogeneous fluid with layering air anteriorly, compatible with endometritis/uterine cavity abscess. 3. Normal ovaries. 4. Trace simple free fluid in the pelvic cul-de-sac. Electronically Signed   By: Delbert Phenix M.D.   On: 11/13/2023 17:05   US PELVIC COMPLETE WITH TRANSVAGINAL Result Date: 11/12/2023 CLINICAL DATA:  Pelvic pain EXAM: TRANSABDOMINAL AND TRANSVAGINAL ULTRASOUND OF PELVIS TECHNIQUE: Both transabdominal and transvaginal ultrasound examinations of the pelvis were performed. Transabdominal technique was performed for global imaging of the pelvis including uterus, ovaries, adnexal regions, and pelvic cul-de-sac. It was necessary to proceed with endovaginal exam following the transabdominal exam to visualize the adnexa. COMPARISON:  CT 11/12/2023 FINDINGS: Uterus Vaginal cuff grossly unremarkable. Poorly visible uterus due to dirty shadowing from air. On endovaginal images and cine clips, large gas and fluid collection at the midline pelvis presumably corresponding to the gas and fluid collections seen on CT. Endometrium Not visualized, unable to measure. Right ovary Not seen. Left  ovary Not seen Other findings No abnormal free fluid. IMPRESSION: Poorly visible uterus due to dirty shadowing from air. On endovaginal images and cine clips, large gas and fluid collection at the midline pelvis presumably corresponding to the gas and fluid collection on CT which appears to be intrauterine in location, concerning for intrauterine abscess. CT demonstrated potential colo uterine fistula as well. Nonvisualized ovaries Electronically Signed   By: Jasmine Pang M.D.   On: 11/12/2023 23:45   CT ABDOMEN PELVIS W CONTRAST Result Date: 11/12/2023 CLINICAL DATA:  Right lower quadrant abdominal pain EXAM: CT ABDOMEN AND PELVIS WITH CONTRAST TECHNIQUE: Multidetector CT imaging of the abdomen and pelvis was performed using the standard  protocol following bolus administration of intravenous contrast. RADIATION DOSE REDUCTION: This exam was performed according to the departmental dose-optimization program which includes automated exposure control, adjustment of the mA and/or kV according to patient size and/or use of iterative reconstruction technique. CONTRAST:  OMNIPAQUE IOHEXOL 300 MG/ML  SOLN COMPARISON:  None Available. FINDINGS: Lower chest: No acute abnormality. Hepatobiliary: Unremarkable liver. Normal gallbladder. No biliary dilation. Pancreas: Unremarkable. Spleen: Unremarkable. Adrenals/Urinary Tract: Normal adrenal glands. Low-attenuation lesions in the kidneys are statistically likely to represent cysts. No follow-up is required. No urinary calculi or hydronephrosis. Bladder is unremarkable. Stomach/Bowel: Normal caliber large and small bowel. Colonic diverticulosis without diverticulitis. No bowel wall thickening. The appendix is normal.Stomach is within normal limits. Vascular/Lymphatic: No significant vascular findings are present. No enlarged abdominal or pelvic lymph nodes. Reproductive: Frothy debris with air-fluid level within the dilated uterus. Mild fat stranding anterior to the uterus. Ovaries are unremarkable. Other: Question free intraperitoneal air and trace fluid between the posterior right uterus and the sigmoid colon (circa series 6/image 56-61). Musculoskeletal: No acute fracture. Multilevel thoracolumbar spondylosis IMPRESSION: 1. Frothy debris with air-fluid level within the uterus concerning for endometritis. Pelvic ultrasound and gynecology consult is recommended. 2. Question colo-uterine fistula between the sigmoid colon and posterior right uterus. These results were called by telephone at the time of interpretation on 11/12/2023 at 5:13 pm to provider Dr. Karene Fry, Who verbally acknowledged these results. Electronically Signed   By: Minerva Fester M.D.   On: 11/12/2023 17:14    Assessment: *  Leukocytosis *Colo-uterine Fistula *Uterine abscess *Cervical stenosis  *Acute Cystitis  Plan:   Leukocystosis has increased since yesterday on  Rocephin and Flagyl  Consider  discontinuation of rocephin ... Given uterine abscess on MRI  Consider switching to   Piperacillin/tazobactam 3.375 g IV q6h or 4.5 g (100 mg/kg) IV q8h OR  Metronidazole 500 mg IV q8h (7.5 mg/kg IV q6h) PLUS Cefepime 2 g (50 mg/kg) IV q12h    Plan to make patient NPO after midnight for probably Hysteroscopy D&C  due to cervical stenosis to allow drainage of abscess. Repeat CBC and CMP in the AM  [facility] will assume care at 7 am in the morning.  Recommendations were relayed to Dr.  Maurine Simmering 11/14/2023

## 2023-11-14 NOTE — Progress Notes (Signed)
[facility]                                                                               Bowen, is a 82 y.o. female, DOB - [birth year], NWG:[id number] Admit date - 11/12/2023    Outpatient Primary MD for the patient is Myrlene Broker, MD  LOS - 2  days    Brief summary   Heather Grant is a 82 y.o. female with medical history significant for essential pretension, hyperlipidemia, moderate persistent asthma, who is admitted to [facility] on 11/12/2023 by way of transfer from [facility] with endometritis after presenting from home to to the latter facility complaining of lower abdominal discomfort.    Assessment & Plan    Assessment and Plan:   Lower abdominal pain Possibly endometriosis evident on the CT of the abdomen and pelvis. Patient was started on broad-spectrum IV antibiotics, transitioned to IV CEFEPIME and IV flagyl as leukocytosis is worsening.  GYN  on board, appreciate recommendations MRI of the pelvis ordered for further evaluation as there is suspicion for colo uterine fistula. MRI pelvis shows  Moderate sigmoid diverticulosis. 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 Markedly distended uterine cavity by heterogeneous fluid with layering air anteriorly, compatible with endometritis/uterine cavity abscess. General Surgery consulted and recommendations given. GC/chlamydia probe collected and sent for labs   Acute cystitis Follow up urine and blood cultures.     Hypokalemia Replaced, repeat level wnl.    Normocytic anemia Continue to follow    Essential hypertension Blood pressure parameters are optimal    Hyperlipidemia Patient on Crestor, continue the same   Asthma No wheezing heard on exam today Continue with Dulera twice daily    GERD Stable       Estimated  body mass index is 34.06 kg/m as calculated from the following:   Height as of this encounter: 4\' 11"  (1.499 m).   Weight as of this encounter: 76.5 kg.  Code Status: Full code DVT Prophylaxis:  SCDs Start: 11/12/23 2110   Level of Care: Level of care: Med-Surg Family Communication: None at bedside  Disposition Plan:     Remains inpatient appropriate: IV antibiotics   Procedures:  None  Consultants:   GYN consult General surgery  Antimicrobials:   Anti-infectives (From admission, onward)    Start     Dose/Rate Route Frequency Ordered Stop   11/14/23 1600  metroNIDAZOLE (FLAGYL) tablet 500 mg  Status:  Discontinued        500 mg Oral Every 12 hours 11/14/23 1041 11/14/23 1318   11/14/23 1430  ceFEPIme (MAXIPIME) 2 g in sodium chloride 0.9 % 100 mL IVPB        2 g 200 mL/hr over 30 Minutes Intravenous Every 24 hours 11/14/23 1335     11/14/23 1430  metroNIDAZOLE (FLAGYL) IVPB 500 mg        500 mg 100 mL/hr over 60 Minutes Intravenous Every 12 hours 11/14/23 1335     11/13/23 1200  cefTRIAXone (ROCEPHIN) 2 g in sodium chloride 0.9 % 100  mL IVPB  Status:  Discontinued        2 g 200 mL/hr over 30 Minutes Intravenous Every 24 hours 11/12/23 2111 11/14/23 1318   11/13/23 0500  metroNIDAZOLE (FLAGYL) IVPB 500 mg  Status:  Discontinued        500 mg 100 mL/hr over 60 Minutes Intravenous Every 12 hours 11/12/23 2111 11/14/23 1041   11/12/23 1715  metroNIDAZOLE (FLAGYL) IVPB 500 mg        500 mg 100 mL/hr over 60 Minutes Intravenous  Once 11/12/23 1713 11/12/23 1849   11/12/23 1615  cefTRIAXone (ROCEPHIN) 1 g in sodium chloride 0.9 % 100 mL IVPB        1 g 200 mL/hr over 30 Minutes Intravenous  Once 11/12/23 1605 11/12/23 1700        Medications  Scheduled Meds:  latanoprost  1 drop Both Eyes QHS   mometasone-formoterol  2 puff Inhalation BID   rosuvastatin  20 mg Oral Daily   Continuous Infusions:  ceFEPime (MAXIPIME) IV 2 g (11/14/23 1441)   dextrose 5 % and 0.9 %  NaCl 40 mL/hr at 11/14/23 0925   metronidazole 500 mg (11/14/23 1442)   PRN Meds:.acetaminophen **OR** acetaminophen, albuterol, fentaNYL (SUBLIMAZE) injection, melatonin, naLOXone (NARCAN)  injection, ondansetron (ZOFRAN) IV    Subjective:   Heather Grant was seen and examined today.  Minimal abd pain, no nausea, vomiting, or diarrhea.  Objective:   Vitals:   11/14/23 0355 11/14/23 0448 11/14/23 0825 11/14/23 0958  BP: (!) 96/48   (!) 102/40  Pulse: 62   (!) 55  Resp:    15  Temp: 98.7 F (37.1 C)   97.8 F (36.6 C)  TempSrc: Oral   Oral  SpO2: 96%  96% 94%  Weight:  76.5 kg    Height:        Intake/Output Summary (Last 24 hours) at 11/14/2023 1751 Last data filed at 11/14/2023 0420 Gross per 24 hour  Intake 731.85 ml  Output --  Net 731.85 ml   Filed Weights   11/12/23 2251 11/14/23 0448  Weight: 76.3 kg 76.5 kg     Exam General exam: Appears calm and comfortable  Respiratory system: Clear to auscultation. Respiratory effort normal. Cardiovascular system: S1 & S2 heard, RRR. No JVD,  Gastrointestinal system: Abdomen is nondistended, soft and nontender.  Central nervous system: Alert and oriented. No focal neurological deficits. Extremities: Symmetric 5 x 5 power. Skin: No rashes, Psychiatry:Mood & affect appropriate.    Data Reviewed:  I have personally reviewed following labs and imaging studies   CBC Lab Results  Component Value Date   WBC 38.1 (H) 11/14/2023   RBC 3.15 (L) 11/14/2023   HGB 9.7 (L) 11/14/2023   HCT 30.0 (L) 11/14/2023   MCV 95.2 11/14/2023   MCH 30.8 11/14/2023   PLT 408 (H) 11/14/2023   MCHC 32.3 11/14/2023   RDW 13.1 11/14/2023   LYMPHSABS 2.3 11/14/2023   MONOABS 2.3 (H) 11/14/2023   EOSABS 0.0 11/14/2023   BASOSABS 0.0 11/14/2023     Last metabolic panel Lab Results  Component Value Date   NA 141 11/14/2023   K 3.7 11/14/2023   CL 109 11/14/2023   CO2 26 11/14/2023   BUN 7 (L) 11/14/2023   CREATININE 1.45  (H) 11/14/2023   GLUCOSE 103 (H) 11/14/2023   GFRNONAA 36 (L) 11/14/2023   GFRAA 56 (L) 11/19/2012   CALCIUM 9.1 11/14/2023   PROT 5.0 (L) 11/14/2023   ALBUMIN 2.1 (L)  11/14/2023   BILITOT 0.9 11/14/2023   ALKPHOS 81 11/14/2023   AST 18 11/14/2023   ALT 15 11/14/2023   ANIONGAP 6 11/14/2023    CBG (last 3)  No results for input(s): "GLUCAP" in the last 72 hours.    Coagulation Profile: Recent Labs  Lab 11/13/23 0407  INR 1.3*     Radiology Studies: MR PELVIS W WO CONTRAST Result Date: 11/13/2023 CLINICAL DATA:  Inpatient. Concern for colo-uterine fistula on recent CT. EXAM: MRI PELVIS WITHOUT AND WITH CONTRAST TECHNIQUE: Multiplanar multisequence MR imaging of the pelvis was performed both before and after administration of intravenous contrast. CONTRAST:  8mL GADAVIST GADOBUTROL 1 MMOL/ML IV SOLN COMPARISON:  11/12/2023 pelvic ultrasound and CT abdomen/pelvis. FINDINGS: Urinary Tract:  Collapsed normal bladder.  Normal urethra. Bowel: Moderate sigmoid diverticulosis. 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 No significant bowel wall thickening or acute pericolonic inflammatory changes. Vascular/Lymphatic: No pathologically enlarged lymph nodes in the pelvis. No acute vascular abnormality. Reproductive: Uterus: The enlarged anteverted uterus measures 11.7 x 8.1 x 9.0 cm. No uterine fibroids. The uterine cavity is markedly distended (6.6 cm AP diameter) by heterogeneous fluid with layering air anteriorly. Normal uterine cervix with intact cervical fibrous stroma. Ovaries and Adnexa: The right ovary measures 1.5 x 1.2 x 1.0 cm and is unremarkable, with a subcentimeter simple cyst. The left ovary measures 1.5 x 1.0 x 0.9 cm and is normal. There are no suspicious ovarian or adnexal  masses. Other: Trace simple free fluid in the pelvic cul-de-sac. No focal extra uterine fluid collection. Musculoskeletal: No aggressive appearing focal osseous lesions. Marked lower lumbar degenerative disc disease. IMPRESSION: 1. Moderate sigmoid diverticulosis. 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 2. Markedly distended uterine cavity by heterogeneous fluid with layering air anteriorly, compatible with endometritis/uterine cavity abscess. 3. Normal ovaries. 4. Trace simple free fluid in the pelvic cul-de-sac. Electronically Signed   By: Delbert Phenix M.D.   On: 11/13/2023 17:05   US PELVIC COMPLETE WITH TRANSVAGINAL Result Date: 11/12/2023 CLINICAL DATA:  Pelvic pain EXAM: TRANSABDOMINAL AND TRANSVAGINAL ULTRASOUND OF PELVIS TECHNIQUE: Both transabdominal and transvaginal ultrasound examinations of the pelvis were performed. Transabdominal technique was performed for global imaging of the pelvis including uterus, ovaries, adnexal regions, and pelvic cul-de-sac. It was necessary to proceed with endovaginal exam following the transabdominal exam to visualize the adnexa. COMPARISON:  CT 11/12/2023 FINDINGS: Uterus Vaginal cuff grossly unremarkable. Poorly visible uterus due to dirty shadowing from air. On endovaginal images and cine clips, large gas and fluid collection at the midline pelvis presumably corresponding to the gas and fluid collections seen on CT. Endometrium Not visualized, unable to measure. Right ovary Not seen. Left ovary Not seen Other findings No abnormal free fluid. IMPRESSION: Poorly visible uterus due to dirty shadowing from air. On endovaginal images and cine clips, large gas and fluid collection at the midline pelvis presumably corresponding to the gas and fluid collection on CT which appears to be intrauterine in location, concerning for intrauterine abscess.  CT demonstrated potential colo uterine fistula as well. Nonvisualized ovaries Electronically Signed   By: Jasmine Pang M.D.   On: 11/12/2023 23:45       Kathlen Mody M.D. [facility] 11/14/2023, 5:51 PM  Available via Epic secure chat 7am-7pm After 7 pm, please refer to night coverage provider listed on amion.

## 2023-11-15 ENCOUNTER — Ambulatory Visit: Payer: 59 | Admitting: Internal Medicine

## 2023-11-15 ENCOUNTER — Encounter (HOSPITAL_COMMUNITY)
Admission: EM | Disposition: A | Discharge status: Home Health | Payer: Self-pay | Source: Home / Self Care | Attending: Internal Medicine

## 2023-11-15 ENCOUNTER — Inpatient Hospital Stay (HOSPITAL_COMMUNITY): Payer: 59 | Admitting: Anesthesiology

## 2023-11-15 ENCOUNTER — Other Ambulatory Visit: Payer: Self-pay

## 2023-11-15 ENCOUNTER — Telehealth: Payer: Self-pay | Admitting: Internal Medicine

## 2023-11-15 ENCOUNTER — Encounter (HOSPITAL_COMMUNITY): Payer: Self-pay | Admitting: Internal Medicine

## 2023-11-15 DIAGNOSIS — D72829 Elevated white blood cell count, unspecified: Secondary | ICD-10-CM | POA: Diagnosis not present

## 2023-11-15 DIAGNOSIS — N3000 Acute cystitis without hematuria: Secondary | ICD-10-CM | POA: Diagnosis not present

## 2023-11-15 DIAGNOSIS — N824 Other female intestinal-genital tract fistulae: Secondary | ICD-10-CM | POA: Diagnosis not present

## 2023-11-15 DIAGNOSIS — D649 Anemia, unspecified: Secondary | ICD-10-CM | POA: Diagnosis not present

## 2023-11-15 DIAGNOSIS — N719 Inflammatory disease of uterus, unspecified: Secondary | ICD-10-CM | POA: Diagnosis not present

## 2023-11-15 HISTORY — PX: DILATION AND EVACUATION: SHX1459

## 2023-11-15 LAB — BASIC METABOLIC PANEL
Anion gap: 6 (ref 5–15)
BUN: 6 mg/dL — ABNORMAL LOW (ref 8–23)
CO2: 21 mmol/L — ABNORMAL LOW (ref 22–32)
Calcium: 8.7 mg/dL — ABNORMAL LOW (ref 8.9–10.3)
Chloride: 114 mmol/L — ABNORMAL HIGH (ref 98–111)
Creatinine, Ser: 0.97 mg/dL (ref 0.44–1.00)
GFR, Estimated: 59 mL/min — ABNORMAL LOW (ref >60)
Glucose, Bld: 110 mg/dL — ABNORMAL HIGH (ref 70–99)
Potassium: 3.6 mmol/L (ref 3.5–5.1)
Sodium: 141 mmol/L (ref 135–145)

## 2023-11-15 LAB — CBC WITH DIFFERENTIAL/PLATELET
Abs Immature Granulocytes: 1.72 10*3/uL — ABNORMAL HIGH (ref 0.00–0.07)
Basophils Absolute: 0.3 10*3/uL — ABNORMAL HIGH (ref 0.0–0.1)
Basophils Relative: 1 %
Eosinophils Absolute: 0.3 10*3/uL (ref 0.0–0.5)
Eosinophils Relative: 1 %
HCT: 30.5 % — ABNORMAL LOW (ref 36.0–46.0)
Hemoglobin: 9.7 g/dL — ABNORMAL LOW (ref 12.0–15.0)
Immature Granulocytes: 6 %
Lymphocytes Relative: 10 %
Lymphs Abs: 2.9 10*3/uL (ref 0.7–4.0)
MCH: 30.6 pg (ref 26.0–34.0)
MCHC: 31.8 g/dL (ref 30.0–36.0)
MCV: 96.2 fL (ref 80.0–100.0)
Monocytes Absolute: 2.6 10*3/uL — ABNORMAL HIGH (ref 0.1–1.0)
Monocytes Relative: 9 %
Neutro Abs: 22 10*3/uL — ABNORMAL HIGH (ref 1.7–7.7)
Neutrophils Relative %: 73 %
Platelets: 427 10*3/uL — ABNORMAL HIGH (ref 150–400)
RBC: 3.17 MIL/uL — ABNORMAL LOW (ref 3.87–5.11)
RDW: 13.2 % (ref 11.5–15.5)
WBC: 29.8 10*3/uL — ABNORMAL HIGH (ref 4.0–10.5)
nRBC: 0 % (ref 0.0–0.2)

## 2023-11-15 LAB — GC/CHLAMYDIA PROBE AMP (~~LOC~~) NOT AT ARMC
Chlamydia: NEGATIVE
Comment: NEGATIVE
Comment: NORMAL
Neisseria Gonorrhea: NEGATIVE

## 2023-11-15 SURGERY — DILATION AND EVACUATION, UTERUS
Anesthesia: General

## 2023-11-15 MED ORDER — METOPROLOL TARTRATE 12.5 MG HALF TABLET
25.0000 mg | ORAL_TABLET | Freq: Once | ORAL | Status: AC
Start: 2023-11-15 — End: 2023-11-15

## 2023-11-15 MED ORDER — PHENYLEPHRINE 80 MCG/ML (10ML) SYRINGE FOR IV PUSH (FOR BLOOD PRESSURE SUPPORT)
PREFILLED_SYRINGE | INTRAVENOUS | Status: DC | PRN
  Administered 2023-11-15: 160 ug via INTRAVENOUS

## 2023-11-15 MED ORDER — METOPROLOL TARTRATE 12.5 MG HALF TABLET
ORAL_TABLET | ORAL | Status: AC
  Administered 2023-11-15: 25 mg via ORAL
  Filled 2023-11-15: qty 2

## 2023-11-15 MED ORDER — DEXTROSE IN LACTATED RINGERS 5 % IV SOLN: INTRAVENOUS | Status: AC

## 2023-11-15 MED ORDER — LIDOCAINE 2% (20 MG/ML) 5 ML SYRINGE
INTRAMUSCULAR | Status: AC
  Filled 2023-11-15: qty 5

## 2023-11-15 MED ORDER — ACETAMINOPHEN 500 MG PO TABS
ORAL_TABLET | ORAL | Status: AC
  Administered 2023-11-15: 1000 mg via ORAL
  Filled 2023-11-15: qty 2

## 2023-11-15 MED ORDER — PROPOFOL 10 MG/ML IV BOLUS
INTRAVENOUS | Status: AC
  Filled 2023-11-15: qty 20

## 2023-11-15 MED ORDER — FENTANYL CITRATE (PF) 250 MCG/5ML IJ SOLN
INTRAMUSCULAR | Status: DC | PRN
  Administered 2023-11-15: 50 ug via INTRAVENOUS

## 2023-11-15 MED ORDER — FENTANYL CITRATE (PF) 100 MCG/2ML IJ SOLN
25.0000 ug | INTRAMUSCULAR | Status: DC | PRN
Start: 2023-11-15 — End: 2023-11-15

## 2023-11-15 MED ORDER — SODIUM CHLORIDE 0.9 % IV SOLN
INTRAVENOUS | Status: DC
Start: 2023-11-15 — End: 2023-11-15

## 2023-11-15 MED ORDER — OXYCODONE HCL 5 MG PO TABS
5.0000 mg | ORAL_TABLET | Freq: Once | ORAL | Status: DC | PRN
Start: 2023-11-15 — End: 2023-11-15

## 2023-11-15 MED ORDER — EPHEDRINE SULFATE-NACL 50-0.9 MG/10ML-% IV SOSY
PREFILLED_SYRINGE | INTRAVENOUS | Status: DC | PRN
  Administered 2023-11-15: 10 mg via INTRAVENOUS

## 2023-11-15 MED ORDER — ACETAMINOPHEN 500 MG PO TABS: 1000.0000 mg | ORAL_TABLET | Freq: Once | ORAL | Status: AC

## 2023-11-15 MED ORDER — ONDANSETRON HCL 4 MG/2ML IJ SOLN
INTRAMUSCULAR | Status: AC
  Filled 2023-11-15: qty 2

## 2023-11-15 MED ORDER — LIDOCAINE HCL 2 % IJ SOLN
INTRAMUSCULAR | Status: DC | PRN
  Administered 2023-11-15: 10 mL

## 2023-11-15 MED ORDER — PROPOFOL 10 MG/ML IV BOLUS
INTRAVENOUS | Status: DC | PRN
  Administered 2023-11-15: 150 mg via INTRAVENOUS

## 2023-11-15 MED ORDER — ACETAMINOPHEN 160 MG/5ML PO SOLN: 1000.0000 mg | Freq: Once | ORAL | Status: DC | PRN

## 2023-11-15 MED ORDER — LIDOCAINE 2% (20 MG/ML) 5 ML SYRINGE
INTRAMUSCULAR | Status: DC | PRN
  Administered 2023-11-15: 60 mg via INTRAVENOUS

## 2023-11-15 MED ORDER — EPHEDRINE 5 MG/ML INJ
INTRAVENOUS | Status: AC
  Filled 2023-11-15: qty 5

## 2023-11-15 MED ORDER — ACETAMINOPHEN 10 MG/ML IV SOLN: 1000.0000 mg | Freq: Once | INTRAVENOUS | Status: DC | PRN

## 2023-11-15 MED ORDER — FENTANYL CITRATE (PF) 250 MCG/5ML IJ SOLN
INTRAMUSCULAR | Status: AC
  Filled 2023-11-15: qty 5

## 2023-11-15 MED ORDER — PHENOL 1.4 % MT LIQD
1.0000 | OROMUCOSAL | Status: DC | PRN
  Administered 2023-11-15: 1 via OROMUCOSAL
  Filled 2023-11-15: qty 177

## 2023-11-15 MED ORDER — PHENYLEPHRINE 80 MCG/ML (10ML) SYRINGE FOR IV PUSH (FOR BLOOD PRESSURE SUPPORT)
PREFILLED_SYRINGE | INTRAVENOUS | Status: AC
Start: 2023-11-15 — End: ?
  Filled 2023-11-15: qty 10

## 2023-11-15 MED ORDER — SODIUM CHLORIDE 0.9 % IV SOLN
2.0000 g | Freq: Two times a day (BID) | INTRAVENOUS | Status: DC
  Administered 2023-11-15 – 2023-11-18 (×7): 2 g via INTRAVENOUS
  Filled 2023-11-15 (×7): qty 12.5

## 2023-11-15 MED ORDER — MIDAZOLAM HCL 2 MG/2ML IJ SOLN
INTRAMUSCULAR | Status: AC
Start: 2023-11-15 — End: ?
  Filled 2023-11-15: qty 2

## 2023-11-15 MED ORDER — CHLORHEXIDINE GLUCONATE 0.12 % MT SOLN
OROMUCOSAL | Status: AC
  Administered 2023-11-15: 15 mL via OROMUCOSAL
  Filled 2023-11-15: qty 15

## 2023-11-15 MED ORDER — ACETAMINOPHEN 500 MG PO TABS: 1000.0000 mg | ORAL_TABLET | Freq: Once | ORAL | Status: DC | PRN

## 2023-11-15 MED ORDER — STERILE WATER FOR IRRIGATION IR SOLN
Status: DC | PRN
  Administered 2023-11-15: 1000 mL

## 2023-11-15 MED ORDER — OXYCODONE HCL 5 MG/5ML PO SOLN: 5.0000 mg | Freq: Once | ORAL | Status: DC | PRN

## 2023-11-15 MED ORDER — ORAL CARE MOUTH RINSE: 15.0000 mL | OROMUCOSAL | Status: DC | PRN

## 2023-11-15 MED ORDER — ONDANSETRON HCL 4 MG/2ML IJ SOLN: 4.0000 mg | Freq: Once | INTRAMUSCULAR | Status: DC | PRN

## 2023-11-15 MED ORDER — LIDOCAINE HCL 2 % IJ SOLN
INTRAMUSCULAR | Status: AC
  Filled 2023-11-15: qty 20

## 2023-11-15 MED ORDER — ORAL CARE MOUTH RINSE: 15.0000 mL | Freq: Once | OROMUCOSAL | Status: AC

## 2023-11-15 MED ORDER — CHLORHEXIDINE GLUCONATE 0.12 % MT SOLN: 15.0000 mL | Freq: Once | OROMUCOSAL | Status: AC

## 2023-11-15 SURGICAL SUPPLY — 22 items
CATH ROBINSON RED A/P 16FR (CATHETERS) ×1 IMPLANT
CURETTE PIPELLE ENDOMTRL SUCTN (MISCELLANEOUS) IMPLANT
DILATOR CANAL MILEX (MISCELLANEOUS) IMPLANT
FILTER UTR ASPR ASSEMBLY (MISCELLANEOUS) ×1 IMPLANT
GLOVE BIO SURGEON STRL SZ7.5 (GLOVE) ×1 IMPLANT
GLOVE BIOGEL PI IND STRL 7.0 (GLOVE) ×1 IMPLANT
GLOVE BIOGEL PI IND STRL 7.5 (GLOVE) ×1 IMPLANT
GOWN STRL REUS W/ TWL LRG LVL3 (GOWN DISPOSABLE) ×2 IMPLANT
HOSE CONNECTING 18IN BERKELEY (TUBING) ×1 IMPLANT
KIT BERKELEY 1ST TRIMESTER 3/8 (MISCELLANEOUS) ×2 IMPLANT
NS IRRIG 1000ML POUR BTL (IV SOLUTION) ×1 IMPLANT
PACK VAGINAL MINOR WOMEN LF (CUSTOM PROCEDURE TRAY) ×1 IMPLANT
PAD OB MATERNITY 4.3X12.25 (PERSONAL CARE ITEMS) ×1 IMPLANT
PIPELLE ENDOMETRIAL SUCTION CU (MISCELLANEOUS) ×1 IMPLANT
SET BERKELEY SUCTION TUBING (SUCTIONS) ×1 IMPLANT
SPIKE FLUID TRANSFER (MISCELLANEOUS) ×1 IMPLANT
TOWEL GREEN STERILE FF (TOWEL DISPOSABLE) ×2 IMPLANT
UNDERPAD 30X36 HEAVY ABSORB (UNDERPADS AND DIAPERS) ×1 IMPLANT
VACURETTE 10 RIGID CVD (CANNULA) IMPLANT
VACURETTE 7MM CVD STRL WRAP (CANNULA) IMPLANT
VACURETTE 8 RIGID CVD (CANNULA) IMPLANT
VACURETTE 9 RIGID CVD (CANNULA) IMPLANT

## 2023-11-15 NOTE — Telephone Encounter (Signed)
Called and LVM for patients daughter

## 2023-11-15 NOTE — Telephone Encounter (Signed)
Pt daughter mentioned that her mom is in the hospital and need her nurse or doctor to call her if possible.  Because "no one called, came by to see my mom or even check on her from your office." She then mentioned, "I thought as her primary care doctor, she would at least want to know what the hospital is doing to her patients. At least to let them know what they can and can not do. That's what they do in [place] where I'm from! They would at least stop by the hospital to check on they're patients.. In other words please let one of them know to call me at they're earliest convenience.." PLEASE ADVISE, THANKS  CB#: [phone]-- Glee Arvin

## 2023-11-15 NOTE — Progress Notes (Signed)
Pharmacy Antibiotic Note- follow-up  Heather Grant is a 82 y.o. female admitted on 11/12/2023 with Uterine abscess and colo-uterine Fistula .  Pharmacy has been consulted for cefepime dosing.  12/16 AM labs: CRCL is 41 ml/min  Plan: Change Cefepime 2gm IV q12h (Adjusted for CrCl ~41 ml/min) Continue Flagyl 500mg  IV q12h Monitor Scr as continues to rise Monitor for deescalation  Height: 4\' 11"  (149.9 cm) Weight: 75.6 kg (166 lb 10.7 oz) IBW/kg (Calculated) : 43.2  Temp (24hrs), Avg:98.2 F (36.8 C), Min:97.8 F (36.6 C), Max:98.8 F (37.1 C)  Recent Labs  Lab 11/12/23 1302 11/12/23 1730 11/13/23 0407 11/14/23 0539 11/15/23 0451  WBC 45.2*  --  37.9* 38.1* 29.8*  CREATININE 1.29*  --  1.31* 1.45* 0.97  LATICACIDVEN  --  1.0  --   --   --     Estimated Creatinine Clearance: 41 mL/min (by C-G formula based on SCr of 0.97 mg/dL).    No Known Allergies  Antimicrobials this admission: 12/13 Ceftriaxone >> 12/15 12/13 Flagyl >>  12/15 Cefepime >>  Dose adjustments this admission: N/A  Microbiology results: 12/13 BCx: NGTD   Greta Doom BS, PharmD, BCPS Clinical Pharmacist 11/15/2023 8:06 AM  Contact: [phone] after 3 PM  "Be curious, not judgmental..." -Debbora Dus

## 2023-11-15 NOTE — Care Management Important Message (Signed)
Important Message  Patient Details  Name: Heather Grant MRN: [id number] Date of Birth: [birth year]   Important Message Given:  Yes - Medicare IM     Renie Ora 11/15/2023, 9:54 AM

## 2023-11-15 NOTE — Anesthesia Postprocedure Evaluation (Signed)
Anesthesia Post Note  Patient: Heather Grant  Procedure(s) Performed: Cervical Dilation     Patient location during evaluation: PACU Anesthesia Type: General Level of consciousness: sedated, patient cooperative and oriented Pain management: pain level controlled Vital Signs Assessment: post-procedure vital signs reviewed and stable Respiratory status: spontaneous breathing, nonlabored ventilation and respiratory function stable Cardiovascular status: blood pressure returned to baseline and stable Postop Assessment: no apparent nausea or vomiting Anesthetic complications: no   No notable events documented.  Last Vitals:  Vitals:   11/15/23 1930 11/15/23 1945  BP: (!) 165/82 138/82  Pulse: (!) 48 (!) 51  Resp: 13 17  Temp:  36.8 C  SpO2: 99% 97%    Last Pain:  Vitals:   11/15/23 1945  TempSrc:   PainSc: 0-No pain                 Neizan Debruhl,E. Jamicheal Heard

## 2023-11-15 NOTE — Progress Notes (Signed)
[facility]                                                                               Keller, is a 82 y.o. female, DOB - Jan 06, 1941, FAO:[id number] Admit date - 11/12/2023    Outpatient Primary MD for the patient is Myrlene Broker, MD  LOS - 3  days    Brief summary   Heather Grant is a 82 y.o. female with medical history significant for essential pretension, hyperlipidemia, moderate persistent asthma, who is admitted to [facility] on 11/12/2023 by way of transfer from [facility] with endometritis after presenting from home to to the latter facility complaining of lower abdominal discomfort.    Assessment & Plan    Assessment and Plan:   Lower abdominal pain/ Endometrial abscess/ Endometriosis Possibly endometriosis evident on the CT of the abdomen and pelvis. Currently on IV cefepime and IV flagyl GYN  on board, appreciate recommendations MRI of the pelvis ordered for further evaluation as there is suspicion for colo uterine fistula. MRI pelvis shows  Moderate sigmoid diverticulosis. 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 Markedly distended uterine cavity by heterogeneous fluid with layering air anteriorly, compatible with endometritis/uterine cavity abscess. Plan for OR tomorrow for abscess evacuation as per Gyn.  General Surgery consulted for colo uterine fistula. and recommendations given. GC/chlamydia probe collected and sent for labs   Acute cystitis Pending urine cultures. On IV cefepime and IV flagyl.     Hypokalemia Replaced, repeat level wnl.    Normocytic anemia Continue to follow    Essential hypertension Blood pressure parameters are well controlled.     Hyperlipidemia Patient on Crestor, continue the same   Asthma No wheezing heard on exam today Continue with  Dulera twice daily    GERD Stable       Estimated body mass index is 33.66 kg/m as calculated from the following:   Height as of this encounter: 4\' 11"  (1.499 m).   Weight as of this encounter: 75.6 kg.  Code Status: Full code DVT Prophylaxis:  SCDs Start: 11/12/23 2110   Level of Care: Level of care: Med-Surg Family Communication:  discussed with daughter over the phone and another daughter at bedside.   Disposition Plan:     Remains inpatient appropriate: IV antibiotics   Procedures:  None  Consultants:   GYN consult General surgery  Antimicrobials:   Anti-infectives (From admission, onward)    Start     Dose/Rate Route Frequency Ordered Stop   11/15/23 0815  ceFEPIme (MAXIPIME) 2 g in sodium chloride 0.9 % 100 mL IVPB        2 g 200 mL/hr over 30 Minutes Intravenous Every 12 hours 11/15/23 0804     11/14/23 1600  metroNIDAZOLE (FLAGYL) tablet 500 mg  Status:  Discontinued        500 mg Oral Every 12 hours 11/14/23 1041 11/14/23 1318   11/14/23 1430  ceFEPIme (MAXIPIME) 2 g in sodium chloride 0.9 % 100 mL IVPB  Status:  Discontinued  2 g 200 mL/hr over 30 Minutes Intravenous Every 24 hours 11/14/23 1335 11/15/23 0804   11/14/23 1430  metroNIDAZOLE (FLAGYL) IVPB 500 mg        500 mg 100 mL/hr over 60 Minutes Intravenous Every 12 hours 11/14/23 1335     11/13/23 1200  cefTRIAXone (ROCEPHIN) 2 g in sodium chloride 0.9 % 100 mL IVPB  Status:  Discontinued        2 g 200 mL/hr over 30 Minutes Intravenous Every 24 hours 11/12/23 2111 11/14/23 1318   11/13/23 0500  metroNIDAZOLE (FLAGYL) IVPB 500 mg  Status:  Discontinued        500 mg 100 mL/hr over 60 Minutes Intravenous Every 12 hours 11/12/23 2111 11/14/23 1041   11/12/23 1715  metroNIDAZOLE (FLAGYL) IVPB 500 mg        500 mg 100 mL/hr over 60 Minutes Intravenous  Once 11/12/23 1713 11/12/23 1849   11/12/23 1615  cefTRIAXone (ROCEPHIN) 1 g in sodium chloride 0.9 % 100 mL IVPB        1 g 200 mL/hr over  30 Minutes Intravenous  Once 11/12/23 1605 11/12/23 1700        Medications  Scheduled Meds:  chlorhexidine       latanoprost  1 drop Both Eyes QHS   mometasone-formoterol  2 puff Inhalation BID   rosuvastatin  20 mg Oral Daily   Continuous Infusions:  ceFEPime (MAXIPIME) IV 2 g (11/15/23 0935)   metronidazole 500 mg (11/15/23 1356)   PRN Meds:.acetaminophen **OR** acetaminophen, albuterol, chlorhexidine, fentaNYL (SUBLIMAZE) injection, melatonin, naLOXone (NARCAN)  injection, ondansetron (ZOFRAN) IV, mouth rinse    Subjective:   Heather Grant was seen and examined today.   No new complaints this morning. Wants to know when the surgery is.  Objective:   Vitals:   11/15/23 0344 11/15/23 0500 11/15/23 0812 11/15/23 0827  BP: (!) 147/55  126/80   Pulse: (!) 58  (!) 57   Resp: 18  16   Temp: 98.8 F (37.1 C)     TempSrc: Oral     SpO2: 98%  98% 97%  Weight:  75.6 kg    Height:        Intake/Output Summary (Last 24 hours) at 11/15/2023 1634 Last data filed at 11/15/2023 1250 Gross per 24 hour  Intake 1057.58 ml  Output 425 ml  Net 632.58 ml   Filed Weights   11/12/23 2251 11/14/23 0448 11/15/23 0500  Weight: 76.3 kg 76.5 kg 75.6 kg     Exam General exam: Appears calm and comfortable  Respiratory system: Clear to auscultation. Respiratory effort normal. Cardiovascular system: S1 & S2 heard, RRR. No JVD,  Gastrointestinal system: Abdomen is nondistended, soft and nontender.  Central nervous system: Alert and oriented. No focal neurological deficits. Extremities: Symmetric 5 x 5 power. Skin: No rashes, lesions or ulcers Psychiatry: Mood & affect appropriate.     Data Reviewed:  I have personally reviewed following labs and imaging studies   CBC Lab Results  Component Value Date   WBC 29.8 (H) 11/15/2023   RBC 3.17 (L) 11/15/2023   HGB 9.7 (L) 11/15/2023   HCT 30.5 (L) 11/15/2023   MCV 96.2 11/15/2023   MCH 30.6 11/15/2023   PLT 427 (H)  11/15/2023   MCHC 31.8 11/15/2023   RDW 13.2 11/15/2023   LYMPHSABS 2.9 11/15/2023   MONOABS 2.6 (H) 11/15/2023   EOSABS 0.3 11/15/2023   BASOSABS 0.3 (H) 11/15/2023     Last metabolic panel Lab  Results  Component Value Date   NA 141 11/15/2023   K 3.6 11/15/2023   CL 114 (H) 11/15/2023   CO2 21 (L) 11/15/2023   BUN 6 (L) 11/15/2023   CREATININE 0.97 11/15/2023   GLUCOSE 110 (H) 11/15/2023   GFRNONAA 59 (L) 11/15/2023   GFRAA 56 (L) 11/19/2012   CALCIUM 8.7 (L) 11/15/2023   PROT 5.0 (L) 11/14/2023   ALBUMIN 2.1 (L) 11/14/2023   BILITOT 0.9 11/14/2023   ALKPHOS 81 11/14/2023   AST 18 11/14/2023   ALT 15 11/14/2023   ANIONGAP 6 11/15/2023    CBG (last 3)  No results for input(s): "GLUCAP" in the last 72 hours.    Coagulation Profile: Recent Labs  Lab 11/13/23 0407  INR 1.3*     Radiology Studies: No results found.      Kathlen Mody M.D. [facility] 11/15/2023, 4:34 PM  Available via Epic secure chat 7am-7pm After 7 pm, please refer to night coverage provider listed on amion.

## 2023-11-15 NOTE — Op Note (Addendum)
Preop Diagnosis: 1.Colouterine Fistula 2.Cervical Stenosis  Postop Diagnosis: 1.Colouterine Fistula 2.Cervical Stenosis  Procedure: Cervical Dilation   Anesthesia: General   Anesthesiologist: Val Eagle, MD   Attending: Osborn Coho, MD   Assistant: N/a  Findings: Pus appearing fluid in uterine cavity sent for anaerobic and aerobic culture.  Multiple skin tags on right labia, largest measuring about 1cm.  Pathology: Endometrial biopsy  Fluids: 300 cc  UOP: 30 cc  EBL: None  Complications: None  Procedure:  The patient was taken to the operating room after the risks benefits and alternatives were discussed with the patient, the patient verbalized understanding and consent signed and witnessed.  The patient was placed under anesthesia, prepped and draped in the normal sterile fashion and a time out was performed.  A bivalve speculum was placed in the patient's vagina and the anterior lip of the cervix grasped with a single-tooth tenaculum. A paracervical block was administered using a total of 10 cc of 2% lidocaine.  The cervix was dilated using lacrimal duct dilators and cervical os finder.  The uterus was sounded to 11 cm.  Pus like fluid was noted.  Cultures sent and endometrial biopsy performed with pipelle.  All instruments were removed. The count was correct. The patient was transferred to the recovery room in good condition.

## 2023-11-15 NOTE — Anesthesia Procedure Notes (Signed)
Procedure Name: LMA Insertion Date/Time: 11/15/2023 6:10 PM  Performed by: Thomasene Ripple, CRNAPre-anesthesia Checklist: Patient identified, Emergency Drugs available, Suction available and Patient being monitored Patient Re-evaluated:Patient Re-evaluated prior to induction Oxygen Delivery Method: Circle System Utilized Preoxygenation: Pre-oxygenation with 100% oxygen Induction Type: IV induction Ventilation: Mask ventilation without difficulty LMA: LMA inserted LMA Size: 4.0 Number of attempts: 1 Airway Equipment and Method: Bite block Placement Confirmation: positive ETCO2 Tube secured with: Tape Dental Injury: Teeth and Oropharynx as per pre-operative assessment

## 2023-11-15 NOTE — Progress Notes (Addendum)
   11/15/23 1315  Mobility  Activity Ambulated with assistance in hallway  Level of Assistance Contact guard assist, steadying assist  Assistive Device Front wheel walker  Distance Ambulated (ft) 125 ft  Activity Response Tolerated fair  Mobility Referral Yes  Mobility visit 1 Mobility  Mobility Specialist Start Time (ACUTE ONLY) 1300  Mobility Specialist Stop Time (ACUTE ONLY) 1315  Mobility Specialist Time Calculation (min) (ACUTE ONLY) 15 min   Mobility Specialist: Progress Note  Pt agreeable to mobility session - received in bed. Required CG using RW. C/o "my L big toe is sore."  Returned to bed with all needs met - call bell within reach. Bed alarm on.  Barnie Mort, BS Mobility Specialist Please contact via SecureChat or Rehab office at [phone].

## 2023-11-15 NOTE — Progress Notes (Signed)
Subjective: Patient reports + flatus and no problems voiding.  Mid lower abdominal pain.  Objective: I have reviewed patient's vital signs.  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  I discussed options with patient and she would like cervical dilation.  I discussed that I can't guarantee that it will improve the pain and that she may start having stool flow through her vagina.  Her daughter and friend were present for the discussion.  Risks benefits and alternatives reviewed with the patient including but not limited to bleeding infection and injury.  Questions answered and consent signed and witnessed.   LOS: 3 days    Purcell Nails, MD 11/15/2023, 5:40 PM

## 2023-11-15 NOTE — Anesthesia Preprocedure Evaluation (Addendum)
Anesthesia Evaluation  Patient identified by MRN, date of birth, ID band Patient awake    Reviewed: Allergy & Precautions, NPO status , Patient's Chart, lab work & pertinent test results  History of Anesthesia Complications Negative for: history of anesthetic complications  Airway Mallampati: I  TM Distance: >3 FB Neck ROM: Full    Dental  (+) Dental Advisory Given, Caps   Pulmonary asthma , sleep apnea (noncompliant)    Pulmonary exam normal        Cardiovascular hypertension, Pt. on home beta blockers and Pt. on medications Normal cardiovascular exam     Neuro/Psych  Headaches  negative psych ROS   GI/Hepatic Neg liver ROS,GERD  Controlled and Medicated,,  Endo/Other   Obesity   Renal/GU negative Renal ROS     Musculoskeletal  (+) Arthritis ,    Abdominal   Peds  Hematology  (+) Blood dyscrasia, anemia   Anesthesia Other Findings Colouterine Fistula  Reproductive/Obstetrics                             Anesthesia Physical Anesthesia Plan  ASA: 2  Anesthesia Plan: General   Post-op Pain Management: Tylenol PO (pre-op)*   Induction: Intravenous  PONV Risk Score and Plan: 3 and Treatment may vary due to age or medical condition and Ondansetron  Airway Management Planned: LMA  Additional Equipment: None  Intra-op Plan:   Post-operative Plan: Extubation in OR  Informed Consent: I have reviewed the patients History and Physical, chart, labs and discussed the procedure including the risks, benefits and alternatives for the proposed anesthesia with the patient or authorized representative who has indicated his/her understanding and acceptance.     Dental advisory given  Plan Discussed with: CRNA and Anesthesiologist  Anesthesia Plan Comments:        Anesthesia Quick Evaluation

## 2023-11-15 NOTE — Transfer of Care (Signed)
Immediate Anesthesia Transfer of Care Note  Patient: Heather Grant  Procedure(s) Performed: Cervical Dilation  Patient Location: PACU  Anesthesia Type:General  Level of Consciousness: awake and alert   Airway & Oxygen Therapy: Patient Spontanous Breathing  Post-op Assessment: Report given to RN and Post -op Vital signs reviewed and stable  Post vital signs: Reviewed and stable  Last Vitals:  Vitals Value Taken Time  BP 164/51 11/15/23 1912  Temp    Pulse 56 11/15/23 1915  Resp 17 11/15/23 1915  SpO2 100 % 11/15/23 1915  Vitals shown include unfiled device data.  Last Pain:  Vitals:   11/15/23 1646  TempSrc: Oral  PainSc:          Complications: No notable events documented.

## 2023-11-15 NOTE — Evaluation (Addendum)
Physical Therapy Evaluation Patient Details Name: Heather Grant MRN: [id number] DOB: 09-01-1941 Today's Date: 11/15/2023  History of Present Illness  Pt is a 82 y.o. F who presents 11/12/2023 with lower abdominal pain and possible endometriosis as evident on CT of abdomen and pelvis. MRI pelvis showing moderate sigmoid diverticulosis. Significant PMH: HLD, moderate persistent asthma.  Clinical Impression  PTA, pt lives alone in a level entry apartment and is independent with ambulation using a Rollator. Pt has intermittent assist from family members for IADL's, but is typically independent with ADL's. On PT evaluation, pt reports improvement in abdominal pain. Pt presents with impaired standing balance, weakness, and decreased activity tolerance. Ambulating 50 ft with a walker and CGA. Would benefit from follow up HHPT at d/c to address deficits.        If plan is discharge home, recommend the following: A little help with walking and/or transfers;A little help with bathing/dressing/bathroom;Assistance with cooking/housework;Assist for transportation;Help with stairs or ramp for entrance   Can travel by private vehicle        Equipment Recommendations None recommended by PT  Recommendations for Other Services       Functional Status Assessment Patient has had a recent decline in their functional status and demonstrates the ability to make significant improvements in function in a reasonable and predictable amount of time.     Precautions / Restrictions Precautions Precautions: Fall Restrictions Weight Bearing Restrictions Per Provider Order: No      Mobility  Bed Mobility Overal bed mobility: Modified Independent                  Transfers Overall transfer level: Needs assistance Equipment used: Rolling walker (2 wheels) Transfers: Sit to/from Stand Sit to Stand: Contact guard assist                Ambulation/Gait Ambulation/Gait assistance: Contact  guard assist Gait Distance (Feet): 50 Feet Assistive device: Rolling walker (2 wheels) Gait Pattern/deviations: Step-through pattern, Decreased stride length Gait velocity: decreased Gait velocity interpretation: <1.31 ft/sec, indicative of household ambulator   General Gait Details: Slowed pace, moderate reliance through arms on walker, fatigues quickly. CGA for safety, dynamic instability  Stairs            Wheelchair Mobility     Tilt Bed    Modified Rankin (Stroke Patients Only)       Balance Overall balance assessment: Needs assistance Sitting-balance support: Feet supported Sitting balance-Leahy Scale: Good     Standing balance support: Bilateral upper extremity supported Standing balance-Leahy Scale: Poor Standing balance comment: reliant on RW                             Pertinent Vitals/Pain Pain Assessment Pain Assessment: No/denies pain    Home Living Family/patient expects to be discharged to:: Private residence Living Arrangements: Alone Available Help at Discharge: Family;Available PRN/intermittently (3 children that live close by, 1 son in [place]) Type of Home: Apartment Home Access: Level entry       Home Layout: One level Home Equipment: Cane - single Librarian, academic (2 wheels);Rollator (4 wheels);Shower seat      Prior Function Prior Level of Function : Independent/Modified Independent             Mobility Comments: uses cane vs Rollator ADLs Comments: Transfers into tub/shower by sitting on stool and then gets down into tub. Pt granddaughter brings her meals sometimes     Extremity/Trunk  Assessment   Upper Extremity Assessment Upper Extremity Assessment: Overall WFL for tasks assessed    Lower Extremity Assessment Lower Extremity Assessment: Generalized weakness       Communication   Communication Communication: No apparent difficulties  Cognition Arousal: Alert Behavior During Therapy: WFL for tasks  assessed/performed Overall Cognitive Status: Within Functional Limits for tasks assessed                                          General Comments      Exercises     Assessment/Plan    PT Assessment Patient needs continued PT services  PT Problem List Decreased strength;Decreased activity tolerance;Decreased balance;Decreased mobility       PT Treatment Interventions DME instruction;Gait training;Functional mobility training;Therapeutic activities;Therapeutic exercise;Balance training;Patient/family education    PT Goals (Current goals can be found in the Care Plan section)  Acute Rehab PT Goals Patient Stated Goal: return to independence PT Goal Formulation: With patient Time For Goal Achievement: 11/29/23 Potential to Achieve Goals: Good    Frequency Min 1X/week     Co-evaluation               AM-PAC PT "6 Clicks" Mobility  Outcome Measure Help needed turning from your back to your side while in a flat bed without using bedrails?: None Help needed moving from lying on your back to sitting on the side of a flat bed without using bedrails?: None Help needed moving to and from a bed to a chair (including a wheelchair)?: A Little Help needed standing up from a chair using your arms (e.g., wheelchair or bedside chair)?: A Little Help needed to walk in hospital room?: A Little Help needed climbing 3-5 steps with a railing? : A Lot 6 Click Score: 19    End of Session   Activity Tolerance: Patient tolerated treatment well Patient left: in chair;with call bell/phone within reach Nurse Communication: Mobility status PT Visit Diagnosis: Unsteadiness on feet (R26.81);Difficulty in walking, not elsewhere classified (R26.2)    Time: 1610-9604 PT Time Calculation (min) (ACUTE ONLY): 24 min   Charges:   PT Evaluation $PT Eval Low Complexity: 1 Low PT Treatments $Therapeutic Activity: 8-22 mins PT General Charges $$ ACUTE PT VISIT: 1 Visit          Lillia Pauls, PT, DPT Acute Rehabilitation Services Office [phone]   Norval Morton 11/15/2023, 12:48 PM

## 2023-11-15 NOTE — TOC Initial Note (Signed)
Transition of Care (TOC) - Initial/Assessment Note   Spoke to patient and family at bedside. Patient from home alone.  Patient has a rollator at home already.   PT recommending HHPT . Discussed HHPT usually comes twice a week for about a hour at a time. Patient agreeable.   Tresa Endo with [facility] accepted referral.   Entered orders and face to face for MD to sign.   Provided transportation resources on AVS  Patient Details  Name: Heather Grant MRN: [id number] Date of Birth: 01-Dec-1940  Transition of Care [facility]) CM/SW Contact:    Kingsley Plan, RN Phone Number: 11/15/2023, 2:20 PM  Clinical Narrative:                   Expected Discharge Plan: Home w Home Health Services Barriers to Discharge: Continued Medical Work up   Patient Goals and CMS Choice Patient states their goals for this hospitalization and ongoing recovery are:: to return to home CMS Medicare.gov Compare Post Acute Care list provided to:: Patient Choice offered to / list presented to : Patient      Expected Discharge Plan and Services   Discharge Planning Services: CM Consult Post Acute Care Choice: Home Health Living arrangements for the past 2 months: Apartment                 DME Arranged: N/A DME Agency: NA       HH Arranged: RN, PT HH Agency: [facility] Date HH Agency Contacted: 11/15/23 Time HH Agency Contacted: 1420 Representative spoke with at [facility] Agency: Tresa Endo  Prior Living Arrangements/Services Living arrangements for the past 2 months: Apartment Lives with:: Self Patient language and need for interpreter reviewed:: Yes Do you feel safe going back to the place where you live?: Yes      Need for Family Participation in Patient Care: Yes (Comment) Care giver support system in place?: Yes (comment) Current home services: DME Criminal Activity/Legal Involvement Pertinent to Current Situation/Hospitalization: No - Comment as needed  Activities of Daily Living    ADL Screening (condition at time of admission) Independently performs ADLs?: Yes (appropriate for developmental age) Is the patient deaf or have difficulty hearing?: No Does the patient have difficulty seeing, even when wearing glasses/contacts?: No Does the patient have difficulty concentrating, remembering, or making decisions?: No  Permission Sought/Granted   Permission granted to share information with : Yes, Verbal Permission Granted  Share Information with NAME: daughters at bedside           Emotional Assessment Appearance:: Appears stated age Attitude/Demeanor/Rapport: Engaged Affect (typically observed): Accepting Orientation: : Oriented to Self, Oriented to Place, Oriented to  Time, Oriented to Situation Alcohol / Substance Use: Not Applicable Psych Involvement: No (comment)  Admission diagnosis:  Endometritis [N71.9] Patient Active Problem List   Diagnosis Date Noted   Acute cystitis 11/13/2023   Leukocytosis 11/13/2023   Acute anemia 11/13/2023   Dehydration 11/13/2023   Moderate persistent asthma 11/13/2023   Endometritis 11/12/2023   Abdominal discomfort 09/14/2023   Foot swelling 05/13/2023   Numbness and tingling of both feet 10/16/2022   Cyst, dermoid, scalp and neck 02/06/2022   Headache 08/31/2019   Osteopenia 08/31/2019   Chronic venous insufficiency 12/04/2016   Morbid (severe) obesity due to excess calories (HCC) 06/19/2016   GERD (gastroesophageal reflux disease) 06/19/2016   Chronic rhinitis 03/15/2014   Mild intermittent asthma without complication 03/27/2013   Routine general medical examination at a health care facility 07/08/2011   Osteoarthritis  07/08/2011   Essential hypertension    OSA (obstructive sleep apnea)    Gout    Hyperlipidemia    PCP:  Myrlene Broker, MD Pharmacy:   [facility] - [place], [place] - [address] [place] [address] Phone: [phone] Fax:  [phone]     Social Drivers of Health (SDOH) Social History: SDOH Screenings   Food Insecurity: No Food Insecurity (11/12/2023)  Housing: Low Risk  (11/12/2023)  Transportation Needs: Unmet Transportation Needs (11/12/2023)  Utilities: Not At Risk (11/12/2023)  Alcohol Screen: Low Risk  (01/18/2023)  Depression (PHQ2-9): Low Risk  (04/21/2023)  Financial Resource Strain: Low Risk  (01/18/2023)  Physical Activity: Inactive (01/18/2023)  Social Connections: Moderately Isolated (01/18/2023)  Stress: No Stress Concern Present (01/18/2023)  Tobacco Use: Low Risk  (11/12/2023)   SDOH Interventions: Transportation Interventions: Inpatient TOC   Readmission Risk Interventions     No data to display

## 2023-11-16 ENCOUNTER — Encounter (HOSPITAL_COMMUNITY): Payer: Self-pay | Admitting: Obstetrics and Gynecology

## 2023-11-16 DIAGNOSIS — N719 Inflammatory disease of uterus, unspecified: Secondary | ICD-10-CM | POA: Diagnosis not present

## 2023-11-16 DIAGNOSIS — N3000 Acute cystitis without hematuria: Secondary | ICD-10-CM | POA: Diagnosis not present

## 2023-11-16 LAB — BASIC METABOLIC PANEL
Anion gap: 7 (ref 5–15)
BUN: 5 mg/dL — ABNORMAL LOW (ref 8–23)
CO2: 23 mmol/L (ref 22–32)
Calcium: 9 mg/dL (ref 8.9–10.3)
Chloride: 112 mmol/L — ABNORMAL HIGH (ref 98–111)
Creatinine, Ser: 1.01 mg/dL — ABNORMAL HIGH (ref 0.44–1.00)
GFR, Estimated: 56 mL/min — ABNORMAL LOW (ref >60)
Glucose, Bld: 145 mg/dL — ABNORMAL HIGH (ref 70–99)
Potassium: 3.3 mmol/L — ABNORMAL LOW (ref 3.5–5.1)
Sodium: 142 mmol/L (ref 135–145)

## 2023-11-16 LAB — CBC WITH DIFFERENTIAL/PLATELET
Abs Immature Granulocytes: 0 10*3/uL (ref 0.00–0.07)
Basophils Absolute: 0 10*3/uL (ref 0.0–0.1)
Basophils Relative: 0 %
Eosinophils Absolute: 0.2 10*3/uL (ref 0.0–0.5)
Eosinophils Relative: 1 %
HCT: 33 % — ABNORMAL LOW (ref 36.0–46.0)
Hemoglobin: 10.5 g/dL — ABNORMAL LOW (ref 12.0–15.0)
Lymphocytes Relative: 20 %
Lymphs Abs: 4 10*3/uL (ref 0.7–4.0)
MCH: 30.6 pg (ref 26.0–34.0)
MCHC: 31.8 g/dL (ref 30.0–36.0)
MCV: 96.2 fL (ref 80.0–100.0)
Monocytes Absolute: 1.4 10*3/uL — ABNORMAL HIGH (ref 0.1–1.0)
Monocytes Relative: 7 %
Neutro Abs: 14.5 10*3/uL — ABNORMAL HIGH (ref 1.7–7.7)
Neutrophils Relative %: 72 %
Platelets: 433 10*3/uL — ABNORMAL HIGH (ref 150–400)
RBC: 3.43 MIL/uL — ABNORMAL LOW (ref 3.87–5.11)
RDW: 13.4 % (ref 11.5–15.5)
WBC: 20.1 10*3/uL — ABNORMAL HIGH (ref 4.0–10.5)
nRBC: 0 % (ref 0.0–0.2)
nRBC: 0 /100{WBCs}

## 2023-11-16 MED ORDER — METRONIDAZOLE 500 MG/100ML IV SOLN
500.0000 mg | Freq: Two times a day (BID) | INTRAVENOUS | Status: DC
  Administered 2023-11-16 – 2023-11-19 (×6): 500 mg via INTRAVENOUS
  Filled 2023-11-16 (×6): qty 100

## 2023-11-16 MED ORDER — METOPROLOL TARTRATE 25 MG PO TABS
25.0000 mg | ORAL_TABLET | Freq: Two times a day (BID) | ORAL | Status: DC
  Administered 2023-11-16 – 2023-11-17 (×4): 25 mg via ORAL
  Filled 2023-11-16 (×4): qty 1

## 2023-11-16 NOTE — Progress Notes (Signed)
Patient's daughter- [name]- would like to be called tomorrow (12/18) at [phone] to discuss plan of care. She would like to know if the general surgeon can be consulted while patient is in the hospital.

## 2023-11-16 NOTE — Progress Notes (Signed)
   11/15/23 2000  Assess: MEWS Score  Temp (!) 97.5 F (36.4 C)  BP 92/67  MAP (mmHg) 75  Pulse Rate (!) 50  Resp 18  Level of Consciousness Alert  SpO2 99 %  O2 Device Room Air  Assess: MEWS Score  MEWS Temp 0  MEWS Systolic 1  MEWS Pulse 1  MEWS RR 0  MEWS LOC 0  MEWS Score 2  MEWS Score Color Yellow  Assess: if the MEWS score is Yellow or Red  Were vital signs accurate and taken at a resting state? Yes  Does the patient meet 2 or more of the SIRS criteria? No  MEWS guidelines implemented  No, previously yellow, continue vital signs every 4 hours  Notify: Charge Nurse/RN  Name of Charge Nurse/RN Notified Bev,RN  Provider Notification  Provider Name/Title Daniel,James  Date Provider Notified 11/16/23  Method of Notification Page  Notification Reason Other (Comment)  Provider response No new orders  Assess: SIRS CRITERIA  SIRS Temperature  0  SIRS Respirations  0  SIRS Pulse 0  SIRS WBC 0  SIRS Score Sum  0

## 2023-11-16 NOTE — Hospital Course (Signed)
Heather Grant is a 82 y.o. 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  She was started on antibiotics on admission and underwent consultation with OB/GYN.  She underwent cervical dilation on 11/15/2023.

## 2023-11-16 NOTE — Progress Notes (Signed)
Progress Note    Heather Grant   VWU:[id number]  DOB: [birth year]  DOA: 11/12/2023     4 PCP: Myrlene Broker, MD  Initial CC: abd pain  Hospital Course: Heather Grant is a 82 y.o. 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  She was started on antibiotics on admission and underwent consultation with OB/GYN.  She underwent cervical dilation on 11/15/2023.  Interval History:  Notes overnight.  States her pain has improved since procedure yesterday.  Sitting in recliner when seen this morning.  Otherwise had no complaints.  Assessment and Plan:  Endometritis Possibly endometriosis evident on the CT of the abdomen and pelvis. Currently on IV cefepime and IV flagyl GYN on board, appreciate recommendations - s/p cervical dilation on 11/15/2023 - Follow-up fluid culture from uterine cavity obtained 11/15/2023   Acute cystitis Pending urine cultures. On IV cefepime and IV flagyl.    Hypokalemia Replaced, repeat level wnl.    Normocytic anemia Continue to follow     Essential hypertension Blood pressure parameters are well controlled.      Hyperlipidemia Patient on Crestor, continue the same   Asthma Continue with Dulera twice daily   GERD Stable     Estimated body mass index is 33.66 kg/m as calculated from the following:   Height as of this encounter: 4\' 11"  (1.499 m).   Weight as of this encounter: 75.6 kg.    Old records reviewed in assessment of this patient  Antimicrobials: Cefepime 11/14/2023 >> current Flagyl 11/12/2023 >> current  DVT prophylaxis:  SCDs Start: 11/12/23 2110   Code Status:   Code Status: Full Code  Mobility Assessment (Last 72 Hours)     Mobility Assessment     Row Name 11/16/23 0800 11/15/23 2000  11/15/23 1243 11/15/23 0937 11/14/23 2000   Does patient have an order for bedrest or is patient medically unstable No - Continue assessment No - Continue assessment -- No - Continue assessment No - Continue assessment   What is the highest level of mobility based on the progressive mobility assessment? Level 3 (Stands with assist) - Balance while standing  and cannot march in place Level 3 (Stands with assist) - Balance while standing  and cannot march in place Level 5 (Walks with assist in room/hall) - Balance while stepping forward/back and can walk in room with assist - Complete Level 5 (Walks with assist in room/hall) - Balance while stepping forward/back and can walk in room with assist - Complete Level 5 (Walks with assist in room/hall) - Balance while stepping forward/back and can walk in room with assist - Complete    Row Name 11/14/23 1023 11/13/23 2000         Does patient have an order for bedrest or is patient medically unstable No - Continue assessment No - Continue assessment      What is the highest level of mobility based on the progressive mobility assessment? Level 5 (Walks with assist in room/hall) - Balance while stepping forward/back and can walk in room with assist - Complete Level 5 (Walks with assist in room/hall) - Balance while stepping forward/back and can walk in room with assist - Complete               Barriers to discharge: none Disposition Plan:  Home Status is: Inpt  Objective: Blood pressure (!) 109/52, pulse (!) 110, temperature 97.8 F (36.6 C), temperature source Oral, resp. rate 18, height 4\' 11"  (1.499 m), weight 75.6 kg, SpO2 99%.  Examination:  Physical Exam Constitutional:      Appearance: Normal appearance.  HENT:     Head: Normocephalic and atraumatic.     Mouth/Throat:     Mouth: Mucous membranes are moist.  Eyes:     Extraocular Movements: Extraocular movements intact.  Cardiovascular:     Rate and Rhythm: Normal rate and regular  rhythm.  Pulmonary:     Effort: Pulmonary effort is normal. No respiratory distress.     Breath sounds: Normal breath sounds. No wheezing.  Abdominal:     General: Bowel sounds are normal. There is no distension.     Palpations: Abdomen is soft.     Tenderness: There is no abdominal tenderness.  Musculoskeletal:        General: Normal range of motion.     Cervical back: Normal range of motion and neck supple.  Skin:    General: Skin is warm and dry.  Neurological:     General: No focal deficit present.     Mental Status: She is alert.  Psychiatric:        Mood and Affect: Mood normal.      Consultants:  Ob/Gyn  Procedures:  11/15/2023: Cervical dilation  Data Reviewed: Results for orders placed or performed during the hospital encounter of 11/12/23 (from the past 24 hours)  Aerobic/Anaerobic Culture w Gram Stain (surgical/deep wound)     Status: None (Preliminary result)   Collection Time: 11/15/23  7:23 PM   Specimen: Uterus and Cervix; GYN  Result Value Ref Range   Specimen Description UTERUS    Special Requests UTERINE CAVITY FLUID    Gram Stain      FEW WBC PRESENT, PREDOMINANTLY PMN FEW GRAM POSITIVE COCCI    Culture      CULTURE REINCUBATED FOR BETTER GROWTH Performed at [facility], [address] [place], [place] [address]    Report Status PENDING   CBC with Differential/Platelet     Status: Abnormal   Collection Time: 11/16/23  7:38 AM  Result Value Ref Range   WBC 20.1 (H) 4.0 - 10.5 K/uL   RBC 3.43 (L) 3.87 - 5.11 MIL/uL   Hemoglobin 10.5 (L) 12.0 - 15.0 g/dL   HCT 69.6 (L) 29.5 - 28.4 %   MCV 96.2 80.0 - 100.0 fL   MCH 30.6 26.0 - 34.0 pg   MCHC 31.8 30.0 - 36.0 g/dL   RDW 13.2 44.0 - 10.2 %   Platelets 433 (H) 150 - 400 K/uL   nRBC 0.0 0.0 - 0.2 %   Neutrophils Relative % 72 %   Neutro Abs 14.5 (H) 1.7 - 7.7 K/uL   Lymphocytes Relative 20 %   Lymphs Abs 4.0 0.7 - 4.0 K/uL   Monocytes Relative 7 %   Monocytes Absolute 1.4 (H) 0.1 - 1.0  K/uL   Eosinophils Relative 1 %   Eosinophils Absolute 0.2 0.0 - 0.5 K/uL   Basophils Relative 0 %   Basophils Absolute 0.0 0.0 - 0.1 K/uL   nRBC 0 0 /100 WBC   Abs Immature Granulocytes 0.00 0.00 - 0.07 K/uL  Basic metabolic panel     Status: Abnormal   Collection Time: 11/16/23  7:38 AM  Result Value Ref Range   Sodium 142 135 - 145 mmol/L   Potassium 3.3 (L) 3.5 -  5.1 mmol/L   Chloride 112 (H) 98 - 111 mmol/L   CO2 23 22 - 32 mmol/L   Glucose, Bld 145 (H) 70 - 99 mg/dL   BUN 5 (L) 8 - 23 mg/dL   Creatinine, Ser 6.64 (H) 0.44 - 1.00 mg/dL   Calcium 9.0 8.9 - 40.3 mg/dL   GFR, Estimated 56 (L) >60 mL/min   Anion gap 7 5 - 15    I have reviewed pertinent nursing notes, vitals, labs, and images as necessary. I have ordered labwork to follow up on as indicated.  I have reviewed the last notes from staff over past 24 hours. I have discussed patient's care plan and test results with nursing staff, CM/SW, and other staff as appropriate.  Time spent: Greater than 50% of the 55 minute visit was spent in counseling/coordination of care for the patient as laid out in the A&P.   LOS: 4 days   Lewie Chamber, MD [facility] 11/16/2023, 1:18 PM

## 2023-11-16 NOTE — Progress Notes (Signed)
1 Day Post-Op Procedure(s): Cervical Dilation  Subjective: Patient reports tolerating PO and no problems voiding.    Objective: BP (!) 109/52 (BP Location: Right Arm)   Pulse (!) 110   Temp 97.8 F (36.6 C) (Oral)   Resp 18   Ht 4\' 11"  (1.499 m)   Wt 75.6 kg   SpO2 99%   BMI 33.66 kg/m   General: alert and cooperative GI: soft, non-tender; bowel sounds normal; no masses,  no organomegaly Extremities: Homans sign is negative, no sign of DVT Vaginal Bleeding: minimal  Assessment: s/p Procedure(s): Cervical Dilation: stable, tolerating diet, and    Plan: Awaiting pathology Should fu With Dr Sallye Ober and surgery after discharge.   LOS: 4 days    Heather Grant 11/16/2023, 2:19 PM

## 2023-11-16 NOTE — Plan of Care (Signed)

## 2023-11-16 NOTE — Progress Notes (Signed)
   11/16/23 0915  Mobility  Activity Ambulated with assistance in hallway  Level of Assistance Standby assist, set-up cues, supervision of patient - no hands on  Assistive Device Front wheel walker  Distance Ambulated (ft) 80 ft  Activity Response Tolerated fair  Mobility Referral Yes  Mobility visit 1 Mobility  Mobility Specialist Start Time (ACUTE ONLY) 0856  Mobility Specialist Stop Time (ACUTE ONLY) 0915  Mobility Specialist Time Calculation (min) (ACUTE ONLY) 19 min   Mobility Specialist: Progress Note  Pt agreeable to mobility session - received in chair. Required SB using RW. C/o consistent "flem." Returned to chair with all needs met - call bell within reach.   Barnie Mort, BS Mobility Specialist Please contact via SecureChat or Rehab office at [phone].

## 2023-11-17 ENCOUNTER — Ambulatory Visit: Payer: 59 | Admitting: Podiatry

## 2023-11-17 ENCOUNTER — Encounter (HOSPITAL_COMMUNITY): Payer: Self-pay | Admitting: Internal Medicine

## 2023-11-17 DIAGNOSIS — N3000 Acute cystitis without hematuria: Secondary | ICD-10-CM | POA: Diagnosis not present

## 2023-11-17 DIAGNOSIS — I480 Paroxysmal atrial fibrillation: Secondary | ICD-10-CM

## 2023-11-17 DIAGNOSIS — N719 Inflammatory disease of uterus, unspecified: Secondary | ICD-10-CM | POA: Diagnosis not present

## 2023-11-17 LAB — CBC WITH DIFFERENTIAL/PLATELET
Abs Immature Granulocytes: 0 10*3/uL (ref 0.00–0.07)
Basophils Absolute: 0.2 10*3/uL — ABNORMAL HIGH (ref 0.0–0.1)
Basophils Relative: 1 %
Eosinophils Absolute: 0.8 10*3/uL — ABNORMAL HIGH (ref 0.0–0.5)
Eosinophils Relative: 4 %
HCT: 34 % — ABNORMAL LOW (ref 36.0–46.0)
Hemoglobin: 10.9 g/dL — ABNORMAL LOW (ref 12.0–15.0)
Lymphocytes Relative: 12 %
Lymphs Abs: 2.5 10*3/uL (ref 0.7–4.0)
MCH: 30.9 pg (ref 26.0–34.0)
MCHC: 32.1 g/dL (ref 30.0–36.0)
MCV: 96.3 fL (ref 80.0–100.0)
Monocytes Absolute: 1.5 10*3/uL — ABNORMAL HIGH (ref 0.1–1.0)
Monocytes Relative: 7 %
Neutro Abs: 15.8 10*3/uL — ABNORMAL HIGH (ref 1.7–7.7)
Neutrophils Relative %: 76 %
Platelets: 466 10*3/uL — ABNORMAL HIGH (ref 150–400)
RBC: 3.53 MIL/uL — ABNORMAL LOW (ref 3.87–5.11)
RDW: 13.6 % (ref 11.5–15.5)
WBC: 20.8 10*3/uL — ABNORMAL HIGH (ref 4.0–10.5)
nRBC: 0 % (ref 0.0–0.2)
nRBC: 0 /100{WBCs}

## 2023-11-17 LAB — URINE CULTURE: Culture: >=100000 — AB

## 2023-11-17 LAB — BASIC METABOLIC PANEL
Anion gap: 6 (ref 5–15)
BUN: 5 mg/dL — ABNORMAL LOW (ref 8–23)
CO2: 24 mmol/L (ref 22–32)
Calcium: 8.8 mg/dL — ABNORMAL LOW (ref 8.9–10.3)
Chloride: 111 mmol/L (ref 98–111)
Creatinine, Ser: 1.21 mg/dL — ABNORMAL HIGH (ref 0.44–1.00)
GFR, Estimated: 45 mL/min — ABNORMAL LOW (ref >60)
Glucose, Bld: 128 mg/dL — ABNORMAL HIGH (ref 70–99)
Potassium: 3.7 mmol/L (ref 3.5–5.1)
Sodium: 141 mmol/L (ref 135–145)

## 2023-11-17 LAB — CULTURE, BLOOD (ROUTINE X 2): Culture: NO GROWTH

## 2023-11-17 LAB — MAGNESIUM: Magnesium: 1.7 mg/dL (ref 1.7–2.4)

## 2023-11-17 MED ORDER — DEXTROSE-SODIUM CHLORIDE 5-0.2 % IV SOLN: INTRAVENOUS | Status: AC

## 2023-11-17 MED ORDER — METOPROLOL TARTRATE 5 MG/5ML IV SOLN
5.0000 mg | INTRAVENOUS | Status: DC | PRN
Start: 2023-11-17 — End: 2023-11-22
  Administered 2023-11-17: 5 mg via INTRAVENOUS

## 2023-11-17 MED ORDER — METOPROLOL TARTRATE 5 MG/5ML IV SOLN
5.0000 mg | INTRAVENOUS | Status: DC | PRN
  Filled 2023-11-17: qty 5

## 2023-11-17 MED ORDER — SODIUM CHLORIDE 0.9 % IV BOLUS
500.0000 mL | Freq: Once | INTRAVENOUS | Status: AC | PRN
  Administered 2023-11-17: 500 mL via INTRAVENOUS

## 2023-11-17 MED ORDER — ENSURE ENLIVE PO LIQD
237.0000 mL | Freq: Three times a day (TID) | ORAL | Status: DC
  Administered 2023-11-17 – 2023-11-28 (×25): 237 mL via ORAL

## 2023-11-17 NOTE — Progress Notes (Addendum)
MEWS Progress Note  Patient Details Name: Heather Grant MRN: [id number] DOB: 18-May-1941 Today's Date: 11/17/2023   MEWS Flowsheet Documentation:  Assess: MEWS Score Temp: 97.9 F (36.6 C) BP: 95/65 MAP (mmHg): 72 Pulse Rate: (!) 136 ECG Heart Rate: (!) 52 Resp: 18 Level of Consciousness: Alert SpO2: 98 % O2 Device: Room Air Assess: MEWS Score MEWS Temp: 0 MEWS Systolic: 1 MEWS Pulse: 3 MEWS RR: 0 MEWS LOC: 0 MEWS Score: 4 MEWS Score Color: Red Assess: SIRS CRITERIA SIRS Temperature : 0 SIRS Respirations : 0 SIRS Pulse: 1 SIRS WBC: 1 SIRS Score Sum : 2 SIRS Temperature : 0 SIRS Pulse: 1 SIRS Respirations : 0 SIRS WBC: 1 SIRS Score Sum : 2 Assess: if the MEWS score is Yellow or Red Were vital signs accurate and taken at a resting state?: Yes Does the patient meet 2 or more of the SIRS criteria?: Yes Does the patient have a confirmed or suspected source of infection?: Yes MEWS guidelines implemented : Yes, red Treat MEWS Interventions: Considered administering scheduled or prn medications/treatments as ordered Take Vital Signs Increase Vital Sign Frequency : Red: Q1hr x2, continue Q4hrs until patient remains green for 12hrs Escalate MEWS: Escalate: Red: Discuss with charge nurse and notify provider. Consider notifying RRT. If remains red for 2 hours consider need for higher level of care        Paije Goodhart K Jasn Xia 11/17/2023, 10:05 AM

## 2023-11-17 NOTE — Consult Note (Signed)
2 Days Post-Op Procedure(s) (LRB): Cervical Dilation (N/A)  Gynecology consultation provided as requested by Hospitalist- Dr. Lewie Chamber.   Subjective: Patient reports less abdominal pain.  She denies fevers/chills. She is tolerating regular diet but has very little appetite.  She is ambulating and voiding without difficulty.   Objective:    11/17/2023   10:03 AM 11/17/2023    8:36 AM 11/17/2023    6:01 AM  Vitals with BMI  Systolic 88 95 109  Diastolic 47 65 74  Pulse 116 136 60     General: alert, cooperative, and no distress Resp: clear to auscultation bilaterally Cardio: irregularly irregular rhythm GI: soft, non-tender; bowel sounds normal; no masses,  no organomegaly Extremities: extremities normal, atraumatic, no cyanosis or edema and no edema, redness or tenderness in the calves or thighs Vaginal Bleeding: none   Current Facility-Administered Medications:    acetaminophen (TYLENOL) tablet 650 mg, 650 mg, Oral, Q6H PRN, 650 mg at 11/12/23 2300 **OR** acetaminophen (TYLENOL) suppository 650 mg, 650 mg, Rectal, Q6H PRN, Osborn Coho, MD   albuterol (PROVENTIL) (2.5 MG/3ML) 0.083% nebulizer solution 2.5 mg, 2.5 mg, Nebulization, Q4H PRN, Osborn Coho, MD   ceFEPIme (MAXIPIME) 2 g in sodium chloride 0.9 % 100 mL IVPB, 2 g, Intravenous, Q12H, Osborn Coho, MD, Last Rate: 200 mL/hr at 11/17/23 0937, 2 g at 11/17/23 0937   fentaNYL (SUBLIMAZE) injection 25 mcg, 25 mcg, Intravenous, Q2H PRN, Osborn Coho, MD   latanoprost (XALATAN) 0.005 % ophthalmic solution 1 drop, 1 drop, Both Eyes, QHS, Osborn Coho, MD, 1 drop at 11/16/23 2142   melatonin tablet 3 mg, 3 mg, Oral, QHS PRN, Osborn Coho, MD   metoprolol tartrate (LOPRESSOR) injection 5 mg, 5 mg, Intravenous, Q2H PRN, Lewie Chamber, MD   metoprolol tartrate (LOPRESSOR) tablet 25 mg, 25 mg, Oral, BID, Lewie Chamber, MD, 25 mg at 11/17/23 0836   metroNIDAZOLE (FLAGYL) IVPB 500 mg, 500 mg, Intravenous, Q12H,  Mosetta Anis, RPH, Last Rate: 100 mL/hr at 11/17/23 0939, 500 mg at 11/17/23 0939   mometasone-formoterol (DULERA) 100-5 MCG/ACT inhaler 2 puff, 2 puff, Inhalation, BID, Osborn Coho, MD, 2 puff at 11/17/23 0906   naloxone [facility]) injection 0.4 mg, 0.4 mg, Intravenous, PRN, Osborn Coho, MD   ondansetron [facility]) injection 4 mg, 4 mg, Intravenous, Q6H PRN, Osborn Coho, MD, 4 mg at 11/15/23 4098   Oral care mouth rinse, 15 mL, Mouth Rinse, PRN, Osborn Coho, MD   phenol (CHLORASEPTIC) mouth spray 1 spray, 1 spray, Mouth/Throat, PRN, Osborn Coho, MD, 1 spray at 11/15/23 2137   rosuvastatin (CRESTOR) tablet 20 mg, 20 mg, Oral, Daily, Osborn Coho, MD, 20 mg at 11/17/23 1191   sodium chloride 0.9 % bolus 500 mL, 500 mL, Intravenous, Once PRN, Lewie Chamber, MD       Latest Ref Rng & Units 11/17/2023    7:05 AM 11/16/2023    7:38 AM 11/15/2023    4:51 AM  CBC  WBC 4.0 - 10.5 K/uL 20.8  20.1  29.8   Hemoglobin 12.0 - 15.0 g/dL 47.8  29.5  9.7   Hematocrit 36.0 - 46.0 % 34.0  33.0  30.5   Platelets 150 - 400 K/uL 466  433  427        Latest Ref Rng & Units 11/17/2023    7:05 AM 11/16/2023    7:38 AM 11/15/2023    4:51 AM  BMP  Glucose 70 - 99 mg/dL 621  308  657   BUN 8 - 23  mg/dL 5  5  6    Creatinine 0.44 - 1.00 mg/dL 7.82  9.56  2.13   Sodium 135 - 145 mmol/L 141  142  141   Potassium 3.5 - 5.1 mmol/L 3.7  3.3  3.6   Chloride 98 - 111 mmol/L 111  112  114   CO2 22 - 32 mmol/L 24  23  21    Calcium 8.9 - 10.3 mg/dL 8.8  9.0  8.7     Assessment: 82 y/o female admitted for abdominal pain (HD # 6) and 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 Antionette Char and her recommendations are for patient to continue with endometritis treatment and patient may follow up with gynecology oncology as  outpatient to discuss surgical management.  As per Dr. Jean RosenthalChristell Constant surgery is not indicated at this point unless patient is in critical condition. I discussed with Dr. Tamela Oddi that given presence of colo rectal fistula this would be complex surgery to be done by gynecology oncology and she agreed.  Input is appreciated.  - Follow up on endometrial biopsy and cultures from recent cervical dilation procedure. - Consider infectious disease consultation if patient is not responding to IV antibiotics - Atrial fibrillation management by hospitalist - care and input is appreciated.    LOS: 5 days    Prescilla Sours, MD 11/17/2023, 10:10 AM

## 2023-11-17 NOTE — Significant Event (Signed)
Rapid Response Event Note   Reason for Call :  AF RVR  Initial Focused Assessment:  On arrival, pt sitting up in bed A&O x4. No complaints of pain, dizziness, or lethargy. Pt states she has not been able to eat or drink much the past few days.   Vitals: HR 126, BP 88/47, RR 17, spO2 100% on RA.    Interventions:  Scheduled metoprolol given 0836 EKG- PTA Tele monitoring- PTA PRN metoprolol- PTA bolus Md made aware  Plan of Care:  Continue to monitor pt Hr and BP. Pt instructed to call if becomes symptomatic. RN to call with any changes or concerns after fluids given.    Event Summary:   MD Notified: Dr Frederick Peers notified at 1004 Call Time: 0954 Arrival Time: 0958 End Time: 1015  Mordecai Rasmussen, RN

## 2023-11-17 NOTE — Progress Notes (Signed)
   11/17/23 0403  Assess: MEWS Score  Temp 98.5 F (36.9 C)  BP 100/75  MAP (mmHg) 84  Pulse Rate (!) 104  SpO2 99 %  Assess: MEWS Score  MEWS Temp 0  MEWS Systolic 1  MEWS Pulse 1  MEWS RR 0  MEWS LOC 0  MEWS Score 2  MEWS Score Color Yellow  Assess: if the MEWS score is Yellow or Red  Were vital signs accurate and taken at a resting state? Yes  Does the patient meet 2 or more of the SIRS criteria? No  MEWS guidelines implemented  No, previously yellow, continue vital signs every 4 hours  Notify: Charge Nurse/RN  Name of Charge Nurse/RN Notified Ruth,RN  Provider Notification  Provider Name/Title Johann Capers  Date Provider Notified 11/17/23  Time Provider Notified [phone]  Method of Notification Page  Notification Reason Other (Comment) (YELLOW MEWS)  Provider response No new orders  Date of Provider Response 11/17/23  Time of Provider Response 0419  Assess: SIRS CRITERIA  SIRS Temperature  0  SIRS Respirations  0  SIRS Pulse 1  SIRS WBC 0  SIRS Score Sum  1

## 2023-11-17 NOTE — Plan of Care (Signed)

## 2023-11-17 NOTE — Progress Notes (Signed)
   11/17/23 0232  Provider Notification  Provider Name/Title Johann Capers, NP  Date Provider Notified 11/17/23  Time Provider Notified [phone]  Method of Notification Page (secure chat)  Notification Reason Requested by patient/family (Patient's family request for patient to be on boost or Ensure to help with her poor appetite. On -call provider, Johann Capers informed)  Provider response Other (Comment) (Order will be added during rounds)  Date of Provider Response 11/17/23  Time of Provider Response [phone]

## 2023-11-17 NOTE — Consult Note (Signed)
[facility] Surgery Consult Note  Heather Grant [place] [birth year]  [id number].    Requesting MD: Lewie Chamber Chief Complaint/Reason for Consult: colo-uterine fistula   HPI:  Heather Grant is a 82 y.o. female PMH a fib not on anticoagulation, HTN, HLD, asthma, GERD who presented to the ED 12/13 with 2-3 days of lower abdominal discomfort and she states to me incontinence of stool. Imaging on admission reports endometritis with intrauterine abscess and possible colouterine fistula. Patient was admitted to the medical service and started on IV antibiotics. OB/GYN was consulted and took the patient for Hysteroscopy D&C due to cervical stenosis to allow drainage of abscess. Cultures sent and uterine biopsy pending. Her last colonoscopy was 04/26/18 by Dr. Myrtie Neither and showed diverticulosis in the left colon. We are also asked to see regarding colouterine fistula. She denies prior h/o diverticulitis. She denies abdominal pain. Last bowel movement was yesterday without pain. She is having small amount of vaginal discharge today. She is not nauseas or vomiting but not eating much due to low appetite.  Her daughter Heather Grant is on speaker phone during my visit  Abdominal surgical history: none Anticoagulants: none Nonsmoker Rare alcohol use Denies illicit drug use Lives independently in an apartment and uses a walker on occasion at baseline  ROS: All systems reviewed and otherwise negative except for as above  Family History  Problem Relation Age of Onset   Hypertension Mother    Stroke Father    Lung cancer Sister        smoked   Asthma Son     Past Medical History:  Diagnosis Date   Arthritis    hands and knees. May be hips   Gout    great toe, ankles   Hyperlipidemia    Hypertension    Migraines    Moderate persistent asthma    Nocturnal dyspnea    ONO 2010 with several readings  below 88%   Obesity, Class II, BMI 35-39.9    OSA (obstructive sleep apnea)    mild by  study Dec '09. AHI 5.7   Plantar fasciitis    Varicella     Past Surgical History:  Procedure Laterality Date   CARPAL TUNNEL RELEASE     right   cataract     right eye with IOL   COLONOSCOPY WITH PROPOFOL N/A 04/26/2018   Procedure: COLONOSCOPY WITH PROPOFOL;  Surgeon: Sherrilyn Rist, MD;  Location: [facility];  Service: Gastroenterology;  Laterality: N/A;   DILATION AND CURETTAGE OF UTERUS     DILATION AND EVACUATION N/A 11/15/2023   Procedure: Cervical Dilation;  Surgeon: Osborn Coho, MD;  Location: [facility] OR;  Service: Gynecology;  Laterality: N/A;   G5P4     NSVD    Social History:  reports that she has never smoked. She has never used smokeless tobacco. She reports current alcohol use. She reports that she does not use drugs.  Allergies: No Known Allergies  Medications Prior to Admission  Medication Sig Dispense Refill   albuterol (VENTOLIN HFA) 108 (90 Base) MCG/ACT inhaler USE 1 TO 2 INHALATIONS BY MOUTH  EVERY 6 HOURS AS NEEDED FOR  WHEEZING OR SHORTNESS OF BREATH 34 g 2   Cholecalciferol 50 MCG (2000 UT) CAPS Vitamin D3 50 mcg (2,000 unit) capsule  Take 1 capsule every day by oral route as directed.     colchicine 0.6 MG tablet TAKE 1 TABLET BY MOUTH DAILY FOR GOUT FLARE 100 tablet 2   DULERA 100-5 MCG/ACT  AERO USE 2 INHALATIONS BY MOUTH TWICE DAILY 39 g 3   famotidine (PEPCID) 20 MG tablet TAKE 1 TABLET BY MOUTH DAILY AT  BEDTIME 100 tablet 2   hydrALAZINE (APRESOLINE) 25 MG tablet TAKE 1 TABLET BY MOUTH 3 TIMES  DAILY 300 tablet 0   LUMIGAN 0.01 % SOLN Place 1 drop into both eyes at bedtime.     meloxicam (MOBIC) 15 MG tablet TAKE 1 TABLET BY MOUTH DAILY 100 tablet 2   metoprolol tartrate (LOPRESSOR) 25 MG tablet TAKE 1 TABLET BY MOUTH TWICE  DAILY 200 tablet 2   nystatin cream (MYCOSTATIN) APPLY TO AFFECTED AREA TWICE A DAY     OXYGEN Inhale 2 L into the lungs at bedtime.     Polyvinyl Alcohol (LUBRICANT DROPS OP) Place 1 drop into both eyes daily.      rosuvastatin (CRESTOR) 20 MG tablet TAKE 1 TABLET BY MOUTH DAILY 100 tablet 2   VOLTAREN 1 % GEL Apply 2 g topically daily as needed (knee pain).       Prior to Admission medications   Medication Sig Start Date End Date Taking? Authorizing Provider  albuterol (VENTOLIN HFA) 108 (90 Base) MCG/ACT inhaler USE 1 TO 2 INHALATIONS BY MOUTH  EVERY 6 HOURS AS NEEDED FOR  WHEEZING OR SHORTNESS OF BREATH 03/04/23  Yes Myrlene Broker, MD  Cholecalciferol 50 MCG (2000 UT) CAPS Vitamin D3 50 mcg (2,000 unit) capsule  Take 1 capsule every day by oral route as directed.   Yes [provider]  colchicine 0.6 MG tablet TAKE 1 TABLET BY MOUTH DAILY FOR GOUT FLARE 07/23/23  Yes Myrlene Broker, MD  DULERA 100-5 MCG/ACT AERO USE 2 INHALATIONS BY MOUTH TWICE DAILY 07/23/23  Yes Myrlene Broker, MD  famotidine (PEPCID) 20 MG tablet TAKE 1 TABLET BY MOUTH DAILY AT  BEDTIME 10/27/22  Yes Myrlene Broker, MD  hydrALAZINE (APRESOLINE) 25 MG tablet TAKE 1 TABLET BY MOUTH 3 TIMES  DAILY 10/04/23  Yes Myrlene Broker, MD  LUMIGAN 0.01 % SOLN Place 1 drop into both eyes at bedtime. 12/10/21  Yes [provider]  meloxicam (MOBIC) 15 MG tablet TAKE 1 TABLET BY MOUTH DAILY 03/04/23  Yes Myrlene Broker, MD  metoprolol tartrate (LOPRESSOR) 25 MG tablet TAKE 1 TABLET BY MOUTH TWICE  DAILY 10/12/22  Yes Myrlene Broker, MD  nystatin cream (MYCOSTATIN) APPLY TO AFFECTED AREA TWICE A DAY 01/28/22  Yes [provider]  OXYGEN Inhale 2 L into the lungs at bedtime.   Yes [provider]  Polyvinyl Alcohol (LUBRICANT DROPS OP) Place 1 drop into both eyes daily.   Yes [provider]  rosuvastatin (CRESTOR) 20 MG tablet TAKE 1 TABLET BY MOUTH DAILY 10/20/22  Yes Myrlene Broker, MD  VOLTAREN 1 % GEL Apply 2 g topically daily as needed (knee pain).  08/10/14  Yes [provider]    Blood pressure (!) 86/70, pulse 96, temperature 97.8 F (36.6  C), temperature source Oral, resp. rate 17, height 4\' 11"  (1.499 m), weight 78.3 kg, SpO2 100%. Physical Exam: General: pleasant, WD/WN female who is laying in bed in NAD HEENT: head is normocephalic, atraumatic.  Sclera are noninjected.  Pupils equal and round.  Ears and nose without any masses or lesions.  Mouth is pink and moist Heart: regular, rate, and rhythm. Lungs: Respiratory effort nonlabored on room air Abd: soft, NT/ND, +BS, no masses, hernias, or organomegaly MS: no BUE/BLE edema, calves soft and  nontender Skin: warm and dry with no masses, lesions, or rashes Psych: A&Ox4 with an appropriate affect Neuro: MAEs, no gross motor or sensory deficits BUE/BLE  Results for orders placed or performed during the hospital encounter of 11/12/23 (from the past 48 hours)  Aerobic/Anaerobic Culture w Gram Stain (surgical/deep wound)     Status: None (Preliminary result)   Collection Time: 11/15/23  7:23 PM   Specimen: Uterus and Cervix; GYN  Result Value Ref Range   Specimen Description UTERUS    Special Requests UTERINE CAVITY FLUID    Gram Stain      FEW WBC PRESENT, PREDOMINANTLY PMN FEW GRAM POSITIVE COCCI    Culture      RARE GRAM NEGATIVE RODS FEW STREPTOCOCCUS CONSTELLATUS SUSCEPTIBILITIES TO FOLLOW HOLDING FOR POSSIBLE ANAEROBE Performed at [facility], [address], [place], [place] [address]    Report Status PENDING   CBC with Differential/Platelet     Status: Abnormal   Collection Time: 11/16/23  7:38 AM  Result Value Ref Range   WBC 20.1 (H) 4.0 - 10.5 K/uL   RBC 3.43 (L) 3.87 - 5.11 MIL/uL   Hemoglobin 10.5 (L) 12.0 - 15.0 g/dL   HCT 69.6 (L) 29.5 - 28.4 %   MCV 96.2 80.0 - 100.0 fL   MCH 30.6 26.0 - 34.0 pg   MCHC 31.8 30.0 - 36.0 g/dL   RDW 13.2 44.0 - 10.2 %   Platelets 433 (H) 150 - 400 K/uL   nRBC 0.0 0.0 - 0.2 %   Neutrophils Relative % 72 %   Neutro Abs 14.5 (H) 1.7 - 7.7 K/uL   Lymphocytes Relative 20 %   Lymphs Abs 4.0 0.7 - 4.0 K/uL    Monocytes Relative 7 %   Monocytes Absolute 1.4 (H) 0.1 - 1.0 K/uL   Eosinophils Relative 1 %   Eosinophils Absolute 0.2 0.0 - 0.5 K/uL   Basophils Relative 0 %   Basophils Absolute 0.0 0.0 - 0.1 K/uL   nRBC 0 0 /100 WBC   Abs Immature Granulocytes 0.00 0.00 - 0.07 K/uL    Comment: Performed at [facility], [address], [place], [place] [address]  Basic metabolic panel     Status: Abnormal   Collection Time: 11/16/23  7:38 AM  Result Value Ref Range   Sodium 142 135 - 145 mmol/L   Potassium 3.3 (L) 3.5 - 5.1 mmol/L   Chloride 112 (H) 98 - 111 mmol/L   CO2 23 22 - 32 mmol/L   Glucose, Bld 145 (H) 70 - 99 mg/dL    Comment: Glucose reference range applies only to samples taken after fasting for at least 8 hours.   BUN 5 (L) 8 - 23 mg/dL   Creatinine, Ser 6.44 (H) 0.44 - 1.00 mg/dL   Calcium 9.0 8.9 - 03.4 mg/dL   GFR, Estimated 56 (L) >60 mL/min    Comment: (NOTE) Calculated using the CKD-EPI Creatinine Equation (2021)    Anion gap 7 5 - 15    Comment: Performed at [facility], [address], [place], [place] [address]  Basic metabolic panel     Status: Abnormal   Collection Time: 11/17/23  7:05 AM  Result Value Ref Range   Sodium 141 135 - 145 mmol/L   Potassium 3.7 3.5 - 5.1 mmol/L   Chloride 111 98 - 111 mmol/L   CO2 24 22 - 32 mmol/L   Glucose, Bld 128 (H) 70 - 99 mg/dL  Comment: Glucose reference range applies only to samples taken after fasting for at least 8 hours.   BUN 5 (L) 8 - 23 mg/dL   Creatinine, Ser 1.61 (H) 0.44 - 1.00 mg/dL   Calcium 8.8 (L) 8.9 - 10.3 mg/dL   GFR, Estimated 45 (L) >60 mL/min    Comment: (NOTE) Calculated using the CKD-EPI Creatinine Equation (2021)    Anion gap 6 5 - 15    Comment: Performed at [facility], [address] [place], [place] [address]  CBC with Differential/Platelet     Status: Abnormal   Collection Time: 11/17/23  7:05 AM  Result Value Ref Range   WBC 20.8 (H) 4.0 - 10.5 K/uL   RBC 3.53 (L)  3.87 - 5.11 MIL/uL   Hemoglobin 10.9 (L) 12.0 - 15.0 g/dL   HCT 54.0 (L) 98.1 - 19.1 %   MCV 96.3 80.0 - 100.0 fL   MCH 30.9 26.0 - 34.0 pg   MCHC 32.1 30.0 - 36.0 g/dL   RDW 47.8 29.5 - 62.1 %   Platelets 466 (H) 150 - 400 K/uL   nRBC 0.0 0.0 - 0.2 %   Neutrophils Relative % 76 %   Neutro Abs 15.8 (H) 1.7 - 7.7 K/uL   Lymphocytes Relative 12 %   Lymphs Abs 2.5 0.7 - 4.0 K/uL   Monocytes Relative 7 %   Monocytes Absolute 1.5 (H) 0.1 - 1.0 K/uL   Eosinophils Relative 4 %   Eosinophils Absolute 0.8 (H) 0.0 - 0.5 K/uL   Basophils Relative 1 %   Basophils Absolute 0.2 (H) 0.0 - 0.1 K/uL   WBC Morphology See Note     Comment: Increased Bands. >20% Bands   RBC Morphology MORPHOLOGY UNREMARKABLE    nRBC 0 0 /100 WBC   Abs Immature Granulocytes 0.00 0.00 - 0.07 K/uL    Comment: Performed at [facility], [address], [place], [place] [address]  Magnesium     Status: None   Collection Time: 11/17/23  7:05 AM  Result Value Ref Range   Magnesium 1.7 1.7 - 2.4 mg/dL    Comment: Performed at [facility], [address], [place], [place] [address]   No results found.  Anti-infectives (From admission, onward)    Start     Dose/Rate Route Frequency Ordered Stop   11/16/23 2000  metroNIDAZOLE (FLAGYL) IVPB 500 mg        500 mg 100 mL/hr over 60 Minutes Intravenous Every 12 hours 11/16/23 0846     11/15/23 0815  ceFEPIme (MAXIPIME) 2 g in sodium chloride 0.9 % 100 mL IVPB        2 g 200 mL/hr over 30 Minutes Intravenous Every 12 hours 11/15/23 0804     11/14/23 1600  metroNIDAZOLE (FLAGYL) tablet 500 mg  Status:  Discontinued        500 mg Oral Every 12 hours 11/14/23 1041 11/14/23 1318   11/14/23 1430  ceFEPIme (MAXIPIME) 2 g in sodium chloride 0.9 % 100 mL IVPB  Status:  Discontinued        2 g 200 mL/hr over 30 Minutes Intravenous Every 24 hours 11/14/23 1335 11/15/23 0804   11/14/23 1430  metroNIDAZOLE (FLAGYL) IVPB 500 mg  Status:  Discontinued        500 mg 100  mL/hr over 60 Minutes Intravenous Every 12 hours 11/14/23 1335 11/16/23 0846   11/13/23 1200  cefTRIAXone (ROCEPHIN) 2 g in sodium chloride 0.9 %  100 mL IVPB  Status:  Discontinued        2 g 200 mL/hr over 30 Minutes Intravenous Every 24 hours 11/12/23 2111 11/14/23 1318   11/13/23 0500  metroNIDAZOLE (FLAGYL) IVPB 500 mg  Status:  Discontinued        500 mg 100 mL/hr over 60 Minutes Intravenous Every 12 hours 11/12/23 2111 11/14/23 1041   11/12/23 1715  metroNIDAZOLE (FLAGYL) IVPB 500 mg        500 mg 100 mL/hr over 60 Minutes Intravenous  Once 11/12/23 1713 11/12/23 1849   11/12/23 1615  cefTRIAXone (ROCEPHIN) 1 g in sodium chloride 0.9 % 100 mL IVPB        1 g 200 mL/hr over 30 Minutes Intravenous  Once 11/12/23 1605 11/12/23 1700        Assessment/Plan Diverticulosis Endometritis, Acute Cystitis  Colo-uterine fistula - Patient admitted with endometritis and acute cystitis. On work up she was found to have a colo-uterine fistula for which we are asked to see. No indication for acute surgical intervention. We will arrange outpatient colorectal follow up to discuss surgical repair. Prior to surgery we would like for her to see cardiology for preoperative risk stratification and clearance. She would also benefit from repeat colonoscopy as it has been 5 years since her last one. This can be done as an outpatient. Plan discussed with primary team  ID - maxipime/flagyl VTE - SCDs, per primary/ ok for chemical dvt ppx from surgical standpoint FEN - reg diet, Ensure Foley - none   I reviewed Consultant gynecology notes, hospitalist notes, last 24 h vitals and pain scores, last 48 h intake and output, last 24 h labs and trends, and last 24 h imaging results.   Carl Best, [facility] Surgery 11/17/2023, 1:49 PM Please see Amion for pager number during day hours 7:00am-4:30pm

## 2023-11-17 NOTE — Progress Notes (Signed)
Progress Note    Heather Grant   YQM:[id number]  DOB: 12-Sep-1941  DOA: 11/12/2023     5 PCP: Myrlene Broker, MD  Initial CC: abd pain  Hospital Course: Heather Grant is a 82 y.o. female with medical history significant for essential pretension, hyperlipidemia, moderate persistent asthma, who is admitted to [facility] on 11/12/2023 by way of transfer from [facility] with endometritis and concern for a colo-uterine fistula.  She was started on antibiotics on admission and underwent consultation with OB/GYN.  She underwent cervical dilation on 11/15/2023.  Interval History:  No events overnight.  Patient doing well when seen this morning with no issues.  Later in the morning she developed downtrend of blood pressure and some RVR. Morning lopressor given early.  Rapid response was later called by staff and patient given 500 cc normal saline bolus.  Assessment and Plan:  Endometritis -Patient underwent CT abdomen/pelvis, pelvic ultrasound, MRI pelvis on admission - Results consistent with endometritis with posterior uterine body wall densely adherent to the distal sigmoid colon with suspected 1 cm in length Colo uterine fistula - s/p cervical dilation on 11/15/2023 - continue cefepime and flagyl - OB/GYN following as well - family requested inpatient surgery evaluation  - also discussed with ID; will plan on 2 week course at discharge; likely with Augmentin but awaiting further culture results from fluid from uterine cavity obtained 11/15/2023  PAF with RVR - new onset afib on 12/18; suspect precipitated in setting of infection - hold off on AC at this time -Home Lopressor already been resumed -Okay to use IV Lopressor as needed with parameters -EKG obtained on 12/18 - if further hypoTN, will continue with IVF   Acute cystitis Pending urine cultures. On IV cefepime and IV flagyl.  -Urine growing pansensitive E. coli, see above   Hypokalemia Replaced,  repeat level wnl.    Normocytic anemia Continue to follow     Essential hypertension Blood pressure parameters are well controlled.      Hyperlipidemia Patient on Crestor, continue the same   Asthma Continue with Dulera twice daily   GERD Stable     Estimated body mass index is 33.66 kg/m as calculated from the following:   Height as of this encounter: 4\' 11"  (1.499 m).   Weight as of this encounter: 75.6 kg.    Old records reviewed in assessment of this patient  Antimicrobials: Cefepime 11/14/2023 >> current Flagyl 11/12/2023 >> current  DVT prophylaxis:  SCDs Start: 11/12/23 2110   Code Status:   Code Status: Full Code  Mobility Assessment (Last 72 Hours)     Mobility Assessment     Row Name 11/16/23 2013 11/16/23 0800 11/15/23 2000 11/15/23 1243 11/15/23 0937   Does patient have an order for bedrest or is patient medically unstable No - Continue assessment No - Continue assessment No - Continue assessment -- No - Continue assessment   What is the highest level of mobility based on the progressive mobility assessment? Level 3 (Stands with assist) - Balance while standing  and cannot march in place Level 3 (Stands with assist) - Balance while standing  and cannot march in place Level 3 (Stands with assist) - Balance while standing  and cannot march in place Level 5 (Walks with assist in room/hall) - Balance while stepping forward/back and can walk in room with assist - Complete Level 5 (Walks with assist in room/hall) - Balance while stepping forward/back and can walk in room with assist -  Complete   Is the above level different from baseline mobility prior to current illness? Yes - Recommend PT order -- -- -- --    Row Name 11/14/23 2000           Does patient have an order for bedrest or is patient medically unstable No - Continue assessment       What is the highest level of mobility based on the progressive mobility assessment? Level 5 (Walks with assist in  room/hall) - Balance while stepping forward/back and can walk in room with assist - Complete                Barriers to discharge: none Disposition Plan:  Home Status is: Inpt  Objective: Blood pressure (!) 87/74, pulse (!) 129, temperature 98 F (36.7 C), temperature source Oral, resp. rate 17, height 4\' 11"  (1.499 m), weight 78.3 kg, SpO2 100%.  Examination:  Physical Exam Constitutional:      Appearance: Normal appearance.  HENT:     Head: Normocephalic and atraumatic.     Mouth/Throat:     Mouth: Mucous membranes are moist.  Eyes:     Extraocular Movements: Extraocular movements intact.  Cardiovascular:     Rate and Rhythm: Normal rate and regular rhythm.  Pulmonary:     Effort: Pulmonary effort is normal. No respiratory distress.     Breath sounds: Normal breath sounds. No wheezing.  Abdominal:     General: Bowel sounds are normal. There is no distension.     Palpations: Abdomen is soft.     Tenderness: There is no abdominal tenderness.  Musculoskeletal:        General: Normal range of motion.     Cervical back: Normal range of motion and neck supple.  Skin:    General: Skin is warm and dry.  Neurological:     General: No focal deficit present.     Mental Status: She is alert.  Psychiatric:        Mood and Affect: Mood normal.      Consultants:  Ob/Gyn General surgery  Procedures:  11/15/2023: Cervical dilation  Data Reviewed: Results for orders placed or performed during the hospital encounter of 11/12/23 (from the past 24 hours)  Basic metabolic panel     Status: Abnormal   Collection Time: 11/17/23  7:05 AM  Result Value Ref Range   Sodium 141 135 - 145 mmol/L   Potassium 3.7 3.5 - 5.1 mmol/L   Chloride 111 98 - 111 mmol/L   CO2 24 22 - 32 mmol/L   Glucose, Bld 128 (H) 70 - 99 mg/dL   BUN 5 (L) 8 - 23 mg/dL   Creatinine, Ser 1.61 (H) 0.44 - 1.00 mg/dL   Calcium 8.8 (L) 8.9 - 10.3 mg/dL   GFR, Estimated 45 (L) >60 mL/min   Anion gap 6 5 - 15   CBC with Differential/Platelet     Status: Abnormal   Collection Time: 11/17/23  7:05 AM  Result Value Ref Range   WBC 20.8 (H) 4.0 - 10.5 K/uL   RBC 3.53 (L) 3.87 - 5.11 MIL/uL   Hemoglobin 10.9 (L) 12.0 - 15.0 g/dL   HCT 09.6 (L) 04.5 - 40.9 %   MCV 96.3 80.0 - 100.0 fL   MCH 30.9 26.0 - 34.0 pg   MCHC 32.1 30.0 - 36.0 g/dL   RDW 81.1 91.4 - 78.2 %   Platelets 466 (H) 150 - 400 K/uL   nRBC 0.0 0.0 - 0.2 %  Neutrophils Relative % 76 %   Neutro Abs 15.8 (H) 1.7 - 7.7 K/uL   Lymphocytes Relative 12 %   Lymphs Abs 2.5 0.7 - 4.0 K/uL   Monocytes Relative 7 %   Monocytes Absolute 1.5 (H) 0.1 - 1.0 K/uL   Eosinophils Relative 4 %   Eosinophils Absolute 0.8 (H) 0.0 - 0.5 K/uL   Basophils Relative 1 %   Basophils Absolute 0.2 (H) 0.0 - 0.1 K/uL   WBC Morphology See Note    RBC Morphology MORPHOLOGY UNREMARKABLE    nRBC 0 0 /100 WBC   Abs Immature Granulocytes 0.00 0.00 - 0.07 K/uL  Magnesium     Status: None   Collection Time: 11/17/23  7:05 AM  Result Value Ref Range   Magnesium 1.7 1.7 - 2.4 mg/dL    I have reviewed pertinent nursing notes, vitals, labs, and images as necessary. I have ordered labwork to follow up on as indicated.  I have reviewed the last notes from staff over past 24 hours. I have discussed patient's care plan and test results with nursing staff, CM/SW, and other staff as appropriate.  Time spent: Greater than 50% of the 55 minute visit was spent in counseling/coordination of care for the patient as laid out in the A&P.   LOS: 5 days   Lewie Chamber, MD [facility] 11/17/2023, 12:13 PM

## 2023-11-17 NOTE — Progress Notes (Signed)
Physical Therapy Treatment Patient Details Name: Heather Grant MRN: [id number] DOB: 1941/06/25 Today's Date: 11/17/2023   History of Present Illness Pt is a 82 y.o. F who presents 11/12/2023 with lower abdominal pain and possible endometriosis as evident on CT of abdomen and pelvis. MRI pelvis showing moderate sigmoid diverticulosis. Significant PMH: HLD, moderate persistent asthma.    PT Comments  Pt had Rapid Response earlier today due to decreased BP. This afternoon pt BP continues to be low and RN agreeable to only bed level exercise. Pt happy to participate and reports feeling better afterward. D/c plan remains appropriate.    If plan is discharge home, recommend the following: A little help with walking and/or transfers;A little help with bathing/dressing/bathroom;Assistance with cooking/housework;Assist for transportation;Help with stairs or ramp for entrance   Can travel by private vehicle      yes  Equipment Recommendations  None recommended by PT       Precautions / Restrictions Precautions Precautions: Fall Restrictions Weight Bearing Restrictions Per Provider Order: No           Cognition Arousal: Alert Behavior During Therapy: WFL for tasks assessed/performed Overall Cognitive Status: Within Functional Limits for tasks assessed                                          Exercises General Exercises - Lower Extremity Ankle Circles/Pumps: AROM, Both, 10 reps, Supine Quad Sets: AROM, Both, 10 reps, Supine Gluteal Sets: AROM, Both, 10 reps, Supine Heel Slides: AROM, Both, 10 reps, Supine Hip ABduction/ADduction: AROM, Both, 10 reps, Supine Straight Leg Raises: AROM, Both, 10 reps, Supine    General Comments General comments (skin integrity, edema, etc.): RN reports low BP and request bed level exercise      Pertinent Vitals/Pain Pain Assessment Pain Assessment: No/denies pain     PT Goals (current goals can now be found in the care  plan section) Acute Rehab PT Goals Patient Stated Goal: return to independence PT Goal Formulation: With patient Time For Goal Achievement: 11/29/23 Potential to Achieve Goals: Good Progress towards PT goals: Not progressing toward goals - comment    Frequency    Min 1X/week       AM-PAC PT "6 Clicks" Mobility   Outcome Measure  Help needed turning from your back to your side while in a flat bed without using bedrails?: None Help needed moving from lying on your back to sitting on the side of a flat bed without using bedrails?: None Help needed moving to and from a bed to a chair (including a wheelchair)?: A Little Help needed standing up from a chair using your arms (e.g., wheelchair or bedside chair)?: A Little Help needed to walk in hospital room?: A Little Help needed climbing 3-5 steps with a railing? : A Lot 6 Click Score: 19    End of Session   Activity Tolerance: Patient tolerated treatment well Patient left: with call bell/phone within reach;in bed;with family/visitor present Nurse Communication: Mobility status PT Visit Diagnosis: Unsteadiness on feet (R26.81);Difficulty in walking, not elsewhere classified (R26.2)     Time: 7829-5621 PT Time Calculation (min) (ACUTE ONLY): 17 min  Charges:    $Therapeutic Exercise: 8-22 mins PT General Charges $$ ACUTE PT VISIT: 1 Visit                     Bennette Hasty B. [employer] PT,  DPT Acute Rehabilitation Services Please use secure chat or  Call Office [phone]    Elon Alas [facility] 11/17/2023, 4:53 PM

## 2023-11-18 DIAGNOSIS — B9689 Other specified bacterial agents as the cause of diseases classified elsewhere: Secondary | ICD-10-CM | POA: Diagnosis not present

## 2023-11-18 DIAGNOSIS — N719 Inflammatory disease of uterus, unspecified: Secondary | ICD-10-CM | POA: Diagnosis not present

## 2023-11-18 DIAGNOSIS — I480 Paroxysmal atrial fibrillation: Secondary | ICD-10-CM | POA: Diagnosis not present

## 2023-11-18 DIAGNOSIS — N3000 Acute cystitis without hematuria: Secondary | ICD-10-CM | POA: Diagnosis not present

## 2023-11-18 LAB — CBC WITH DIFFERENTIAL/PLATELET
Abs Immature Granulocytes: 0 10*3/uL (ref 0.00–0.07)
Basophils Absolute: 0.4 10*3/uL — ABNORMAL HIGH (ref 0.0–0.1)
Basophils Relative: 2 %
Eosinophils Absolute: 0.2 10*3/uL (ref 0.0–0.5)
Eosinophils Relative: 1 %
HCT: 30.8 % — ABNORMAL LOW (ref 36.0–46.0)
Hemoglobin: 9.7 g/dL — ABNORMAL LOW (ref 12.0–15.0)
Lymphocytes Relative: 14 %
Lymphs Abs: 3.1 10*3/uL (ref 0.7–4.0)
MCH: 30.5 pg (ref 26.0–34.0)
MCHC: 31.5 g/dL (ref 30.0–36.0)
MCV: 96.9 fL (ref 80.0–100.0)
Monocytes Absolute: 1.1 10*3/uL — ABNORMAL HIGH (ref 0.1–1.0)
Monocytes Relative: 5 %
Neutro Abs: 17.1 10*3/uL — ABNORMAL HIGH (ref 1.7–7.7)
Neutrophils Relative %: 78 %
Platelets: 405 10*3/uL — ABNORMAL HIGH (ref 150–400)
RBC: 3.18 MIL/uL — ABNORMAL LOW (ref 3.87–5.11)
RDW: 13.5 % (ref 11.5–15.5)
WBC: 21.9 10*3/uL — ABNORMAL HIGH (ref 4.0–10.5)
nRBC: 0 /100{WBCs}
nRBC: 0.1 % (ref 0.0–0.2)

## 2023-11-18 LAB — BASIC METABOLIC PANEL
Anion gap: 5 (ref 5–15)
BUN: 6 mg/dL — ABNORMAL LOW (ref 8–23)
CO2: 22 mmol/L (ref 22–32)
Calcium: 8.5 mg/dL — ABNORMAL LOW (ref 8.9–10.3)
Chloride: 110 mmol/L (ref 98–111)
Creatinine, Ser: 1.07 mg/dL — ABNORMAL HIGH (ref 0.44–1.00)
GFR, Estimated: 53 mL/min — ABNORMAL LOW (ref >60)
Glucose, Bld: 129 mg/dL — ABNORMAL HIGH (ref 70–99)
Potassium: 2.8 mmol/L — ABNORMAL LOW (ref 3.5–5.1)
Sodium: 137 mmol/L (ref 135–145)

## 2023-11-18 LAB — SURGICAL PATHOLOGY

## 2023-11-18 LAB — MAGNESIUM: Magnesium: 1.6 mg/dL — ABNORMAL LOW (ref 1.7–2.4)

## 2023-11-18 MED ORDER — POTASSIUM CHLORIDE CRYS ER 20 MEQ PO TBCR
20.0000 meq | EXTENDED_RELEASE_TABLET | Freq: Once | ORAL | Status: AC
  Administered 2023-11-18: 20 meq via ORAL
  Filled 2023-11-18: qty 1

## 2023-11-18 MED ORDER — PROCHLORPERAZINE EDISYLATE 10 MG/2ML IJ SOLN: 5.0000 mg | Freq: Once | INTRAMUSCULAR | Status: DC

## 2023-11-18 MED ORDER — METOPROLOL TARTRATE 12.5 MG HALF TABLET
12.5000 mg | ORAL_TABLET | Freq: Two times a day (BID) | ORAL | Status: DC
  Administered 2023-11-18 (×2): 12.5 mg via ORAL
  Filled 2023-11-18 (×3): qty 1

## 2023-11-18 MED ORDER — SCOPOLAMINE 1 MG/3DAYS TD PT72
1.0000 | MEDICATED_PATCH | Freq: Once | TRANSDERMAL | Status: AC
  Administered 2023-11-18: 1.5 mg via TRANSDERMAL
  Filled 2023-11-18: qty 1

## 2023-11-18 MED ORDER — POTASSIUM CHLORIDE CRYS ER 20 MEQ PO TBCR
40.0000 meq | EXTENDED_RELEASE_TABLET | ORAL | Status: AC
  Administered 2023-11-18 (×2): 40 meq via ORAL
  Filled 2023-11-18 (×2): qty 2

## 2023-11-18 MED ORDER — CEFTRIAXONE SODIUM 2 G IJ SOLR
2.0000 g | INTRAMUSCULAR | Status: DC
  Administered 2023-11-18: 2 g via INTRAVENOUS
  Filled 2023-11-18: qty 20

## 2023-11-18 MED ORDER — MAGNESIUM SULFATE 2 GM/50ML IV SOLN
2.0000 g | Freq: Once | INTRAVENOUS | Status: AC
  Administered 2023-11-18: 2 g via INTRAVENOUS
  Filled 2023-11-18: qty 50

## 2023-11-18 NOTE — Plan of Care (Signed)

## 2023-11-18 NOTE — Progress Notes (Signed)
3 Days Post-Op Procedure(s) (LRB): Cervical Dilation (N/A)  Subjective: Patient reports tolerating PO and no problems voiding.    Objective: I have reviewed patient's vital signs.  General: alert and cooperative Resp: clear to auscultation bilaterally GI: soft, non-tender; bowel sounds normal; no masses,  no organomegaly Vaginal Bleeding: minimal  Assessment: s/p Procedure(s): Cervical Dilation (N/A): stable and progressing well  Plan: S/p D&C  path is benign Can FU with Dr Sallye Ober as an out patient   LOS: 6 days    Michael Litter, MD 11/18/2023, 5:35 PM

## 2023-11-18 NOTE — Progress Notes (Signed)
Progress Note    KANITA GUTEKUNST   YQM:[id number]  DOB: 1941-09-02  DOA: 11/12/2023     6 PCP: Myrlene Broker, MD  Initial CC: abd pain  Hospital Course: Heather Grant is a 82 y.o. female with medical history significant for essential pretension, hyperlipidemia, moderate persistent asthma, who is admitted to [facility] on 11/12/2023 by way of transfer from [facility] with endometritis and concern for a colo-uterine fistula.  She was started on antibiotics on admission and underwent consultation with OB/GYN.  She underwent cervical dilation on 11/15/2023.  Interval History:  No events overnight.  Not having dysuria but still describes urine as "dark yellow". No abd pain.   Assessment and Plan:  Endometritis -Patient underwent CT abdomen/pelvis, pelvic ultrasound, MRI pelvis on admission - Results consistent with endometritis with posterior uterine body wall densely adherent to the distal sigmoid colon with suspected 1 cm in length Colo uterine fistula - s/p cervical dilation on 11/15/2023 - continue cefepime and flagyl - OB/GYN following as well; has recommended outpatient follow up with GYN-onc to discuss surgical management - has also been evaluated by general surgery inpatient and might also see colorectal at discharge too? - given ongoing afib issues and lingering WBC, will ask for more input from ID, as ob/gyn mentions that she might need inpatient surgery if not improving enough to discharge with outpt follow up  - Ucx with E. Coli  - uterine fluid culture: E. coli, Streptococcus constellatus, Bacteroides fragilis  PAF with RVR - new onset afib on 12/18; suspect precipitated in setting of infection - hold off on [facility] at this time -Home Lopressor already been resumed -Okay to use IV Lopressor as needed with parameters -EKG obtained on 12/18 - if further hypoTN, will continue with IVF   Acute cystitis Pending urine cultures. On IV cefepime and IV  flagyl.  -Urine growing pansensitive E. coli, see above   Hypokalemia Replaced, repeat level wnl.    Normocytic anemia Continue to follow   Essential hypertension Blood pressure parameters are well controlled.    Hyperlipidemia Patient on Crestor, continue the same   Asthma Continue with Dulera twice daily   GERD Stable     Estimated body mass index is 33.66 kg/m as calculated from the following:   Height as of this encounter: 4\' 11"  (1.499 m).   Weight as of this encounter: 75.6 kg.    Old records reviewed in assessment of this patient  Antimicrobials: Cefepime 11/14/2023 >> current Flagyl 11/12/2023 >> current  DVT prophylaxis:  SCDs Start: 11/12/23 2110   Code Status:   Code Status: Full Code  Mobility Assessment (Last 72 Hours)     Mobility Assessment     Row Name 11/18/23 1010 11/18/23 0920 11/17/23 1945 11/17/23 1600 11/17/23 0830   Does patient have an order for bedrest or is patient medically unstable -- No - Continue assessment No - Continue assessment -- No - Continue assessment   What is the highest level of mobility based on the progressive mobility assessment? Level 5 (Walks with assist in room/hall) - Balance while stepping forward/back and can walk in room with assist - Complete Level 5 (Walks with assist in room/hall) - Balance while stepping forward/back and can walk in room with assist - Complete Level 5 (Walks with assist in room/hall) - Balance while stepping forward/back and can walk in room with assist - Complete Level 5 (Walks with assist in room/hall) - Balance while stepping forward/back and can walk in  room with assist - Complete Level 5 (Walks with assist in room/hall) - Balance while stepping forward/back and can walk in room with assist - Complete   Is the above level different from baseline mobility prior to current illness? -- Yes - Recommend PT order Yes - Recommend PT order -- Yes - Recommend PT order    Row Name 11/16/23 2013 11/16/23  0800 11/15/23 2000       Does patient have an order for bedrest or is patient medically unstable No - Continue assessment No - Continue assessment No - Continue assessment     What is the highest level of mobility based on the progressive mobility assessment? Level 3 (Stands with assist) - Balance while standing  and cannot march in place Level 3 (Stands with assist) - Balance while standing  and cannot march in place Level 3 (Stands with assist) - Balance while standing  and cannot march in place     Is the above level different from baseline mobility prior to current illness? Yes - Recommend PT order -- --              Barriers to discharge: none Disposition Plan:  Home Status is: Inpt  Objective: Blood pressure (!) 134/56, pulse (!) 56, temperature 97.8 F (36.6 C), temperature source Oral, resp. rate 17, height 4\' 11"  (1.499 m), weight 79 kg, SpO2 100%.  Examination:  Physical Exam Constitutional:      Appearance: Normal appearance.  HENT:     Head: Normocephalic and atraumatic.     Mouth/Throat:     Mouth: Mucous membranes are moist.  Eyes:     Extraocular Movements: Extraocular movements intact.  Cardiovascular:     Rate and Rhythm: Normal rate and regular rhythm.  Pulmonary:     Effort: Pulmonary effort is normal. No respiratory distress.     Breath sounds: Normal breath sounds. No wheezing.  Abdominal:     General: Bowel sounds are normal. There is no distension.     Palpations: Abdomen is soft.     Tenderness: There is no abdominal tenderness.  Musculoskeletal:        General: Normal range of motion.     Cervical back: Normal range of motion and neck supple.  Skin:    General: Skin is warm and dry.  Neurological:     General: No focal deficit present.     Mental Status: She is alert.  Psychiatric:        Mood and Affect: Mood normal.      Consultants:  Ob/Gyn General surgery  Procedures:  11/15/2023: Cervical dilation  Data Reviewed: Results for  orders placed or performed during the hospital encounter of 11/12/23 (from the past 24 hours)  Basic metabolic panel     Status: Abnormal   Collection Time: 11/18/23  6:20 AM  Result Value Ref Range   Sodium 137 135 - 145 mmol/L   Potassium 2.8 (L) 3.5 - 5.1 mmol/L   Chloride 110 98 - 111 mmol/L   CO2 22 22 - 32 mmol/L   Glucose, Bld 129 (H) 70 - 99 mg/dL   BUN 6 (L) 8 - 23 mg/dL   Creatinine, Ser 3.87 (H) 0.44 - 1.00 mg/dL   Calcium 8.5 (L) 8.9 - 10.3 mg/dL   GFR, Estimated 53 (L) >60 mL/min   Anion gap 5 5 - 15  CBC with Differential/Platelet     Status: Abnormal   Collection Time: 11/18/23  6:20 AM  Result Value Ref  Range   WBC 21.9 (H) 4.0 - 10.5 K/uL   RBC 3.18 (L) 3.87 - 5.11 MIL/uL   Hemoglobin 9.7 (L) 12.0 - 15.0 g/dL   HCT 74.2 (L) 59.5 - 63.8 %   MCV 96.9 80.0 - 100.0 fL   MCH 30.5 26.0 - 34.0 pg   MCHC 31.5 30.0 - 36.0 g/dL   RDW 75.6 43.3 - 29.5 %   Platelets 405 (H) 150 - 400 K/uL   nRBC 0.1 0.0 - 0.2 %   Neutrophils Relative % 78 %   Neutro Abs 17.1 (H) 1.7 - 7.7 K/uL   Lymphocytes Relative 14 %   Lymphs Abs 3.1 0.7 - 4.0 K/uL   Monocytes Relative 5 %   Monocytes Absolute 1.1 (H) 0.1 - 1.0 K/uL   Eosinophils Relative 1 %   Eosinophils Absolute 0.2 0.0 - 0.5 K/uL   Basophils Relative 2 %   Basophils Absolute 0.4 (H) 0.0 - 0.1 K/uL   WBC Morphology See Note    nRBC 0 0 /100 WBC   Abs Immature Granulocytes 0.00 0.00 - 0.07 K/uL   Polychromasia PRESENT   Magnesium     Status: Abnormal   Collection Time: 11/18/23  6:20 AM  Result Value Ref Range   Magnesium 1.6 (L) 1.7 - 2.4 mg/dL    I have reviewed pertinent nursing notes, vitals, labs, and images as necessary. I have ordered labwork to follow up on as indicated.  I have reviewed the last notes from staff over past 24 hours. I have discussed patient's care plan and test results with nursing staff, CM/SW, and other staff as appropriate.  Time spent: Greater than 50% of the 55 minute visit was spent in  counseling/coordination of care for the patient as laid out in the A&P.   LOS: 6 days   Lewie Chamber, MD [facility] 11/18/2023, 1:18 PM

## 2023-11-18 NOTE — TOC Progression Note (Signed)
Transition of Care [facility]) - Progression Note    Patient Details  Name: Heather Grant MRN: [id number] Date of Birth: [birth year]  [facility] CM/SW Contact  Carley Hammed, LCSW Phone Number: 11/18/2023, 1:59 PM  Clinical Narrative:    CSW was notified by PT that they updated recommendation to SNF due to a decline. CSW met with pt and daughter at bedside to discuss options. CSW answered all questions as able and provided phone number for family to follow up with questions or advise of choice. TOC will continue to follow.    Expected Discharge Plan: Home w Home Health Services Barriers to Discharge: Continued Medical Work up  Expected Discharge Plan and Services   Discharge Planning Services: CM Consult Post Acute Care Choice: Home Health Living arrangements for the past 2 months: Apartment                 DME Arranged: N/A DME Agency: NA       HH Arranged: RN, PT HH Agency: [facility] Date HH Agency Contacted: 11/15/23 Time HH Agency Contacted: 1420 Representative spoke with at [facility] Agency: Tresa Endo   Social Determinants of Health (SDOH) Interventions SDOH Screenings   Food Insecurity: No Food Insecurity (11/12/2023)  Housing: Low Risk  (11/12/2023)  Transportation Needs: Unmet Transportation Needs (11/12/2023)  Utilities: Not At Risk (11/12/2023)  Alcohol Screen: Low Risk  (01/18/2023)  Depression (PHQ2-9): Low Risk  (04/21/2023)  Financial Resource Strain: Low Risk  (01/18/2023)  Physical Activity: Inactive (01/18/2023)  Social Connections: Moderately Isolated (01/18/2023)  Stress: No Stress Concern Present (01/18/2023)  Tobacco Use: Low Risk  (11/15/2023)    Readmission Risk Interventions     No data to display

## 2023-11-18 NOTE — Progress Notes (Signed)
  3 Days Post-Op   Chief Complaint/Subjective: No abdominal pain  Objective: Vital signs in last 24 hours: Temp:  [97.7 F (36.5 C)-98.2 F (36.8 C)] 97.8 F (36.6 C) (12/19 0831) Pulse Rate:  [50-129] 56 (12/19 0831) Resp:  [16-18] 17 (12/19 0831) BP: (86-134)/(54-74) 134/56 (12/19 0831) SpO2:  [99 %-100 %] 100 % (12/19 0831) Weight:  [79 kg] 79 kg (12/19 0500) Last BM Date : 11/17/23 Intake/Output from previous day: 12/18 0701 - 12/19 0700 In: 2489.3 [P.O.:240; I.V.:1545.4; IV Piggyback:703.9] Out: 525 [Urine:525]  PE: Gen: NAd Resp: nonlabored Card: bradycardic Abd: soft, NT  Lab Results:  Recent Labs    11/17/23 0705 11/18/23 0620  WBC 20.8* 21.9*  HGB 10.9* 9.7*  HCT 34.0* 30.8*  PLT 466* 405*   Recent Labs    11/17/23 0705 11/18/23 0620  NA 141 137  K 3.7 2.8*  CL 111 110  CO2 24 22  GLUCOSE 128* 129*  BUN 5* 6*  CREATININE 1.21* 1.07*  CALCIUM 8.8* 8.5*   No results for input(s): "LABPROT", "INR" in the last 72 hours.    Component Value Date/Time   NA 137 11/18/2023 0620   K 2.8 (L) 11/18/2023 0620   CL 110 11/18/2023 0620   CO2 22 11/18/2023 0620   GLUCOSE 129 (H) 11/18/2023 0620   BUN 6 (L) 11/18/2023 0620   CREATININE 1.07 (H) 11/18/2023 0620   CALCIUM 8.5 (L) 11/18/2023 0620   PROT 5.0 (L) 11/14/2023 0539   ALBUMIN 2.1 (L) 11/14/2023 0539   AST 18 11/14/2023 0539   ALT 15 11/14/2023 0539   ALKPHOS 81 11/14/2023 0539   BILITOT 0.9 11/14/2023 0539   GFRNONAA 53 (L) 11/18/2023 0620   GFRAA 56 (L) 11/19/2012 1708    Assessment/Plan Colouterine fistula -continue antibiotics -WBC stable -does not sound like she is having large vaginal draiange   LOS: 6 days   I reviewed last 24 h vitals and pain scores, last 48 h intake and output, last 24 h labs and trends, and last 24 h imaging results.  This care required moderate level of medical decision making.   De Blanch [facility] Surgery at [facility] 11/18/2023,  10:10 AM Please see Amion for pager number during day hours 7:00am-4:30pm or 7:00am -11:30am on weekends

## 2023-11-18 NOTE — Progress Notes (Signed)
Pharmacy Antibiotic Note- follow-up  Heather Grant is a 82 y.o. female admitted on 11/12/2023 with Uterine abscess and colo-uterine Fistula .  Pharmacy has been consulted for cefepime dosing.  Patient is also on Flagyl.  Renal function stable.  Plan: Cefepime 2gm IV Q12H Flagyl 500mg  IV Q12H Monitor renal fxn, micro data to de-escalate if possible, ID consult  Height: 4\' 11"  (149.9 cm) Weight: 79 kg (174 lb 2.6 oz) IBW/kg (Calculated) : 43.2  Temp (24hrs), Avg:98 F (36.7 C), Min:97.7 F (36.5 C), Max:98.4 F (36.9 C)  Recent Labs  Lab 11/12/23 1730 11/13/23 0407 11/14/23 0539 11/15/23 0451 11/16/23 0738 11/17/23 0705 11/18/23 0620  WBC  --  37.9* 38.1* 29.8* 20.1* 20.8* 21.9*  CREATININE  --  1.31* 1.45* 0.97 1.01* 1.21*  --   LATICACIDVEN 1.0  --   --   --   --   --   --     Estimated Creatinine Clearance: 33.7 mL/min (A) (by C-G formula based on SCr of 1.21 mg/dL (H)).    No Known Allergies   Cefepime 12/15 >> Flagyl 12/15 >>  12/13 ucx- Ecoli (panS) 12/13 BCx - negative 12/14 wet prep unremarkable  12/15 MRSA PCR - negative 12/16 Uterine cavity fluid (pus appearing) - Strep constellatus, GNR  Ailen Strauch D. Laney Potash, PharmD, BCPS, BCCCP 11/18/2023, 9:51 AM

## 2023-11-18 NOTE — Progress Notes (Signed)
Physical Therapy Treatment Patient Details Name: Heather Grant MRN: [id number] DOB: [birth year] Today's Date: 11/18/2023   History of Present Illness Pt is a 82 y.o. F who presents 11/12/2023 with lower abdominal pain and possible endometriosis as evident on CT of abdomen and pelvis. MRI pelvis showing moderate sigmoid diverticulosis. Significant PMH: HLD, moderate persistent asthma.    PT Comments  Pt with regression towards her physical therapy goals. Pt alert and oriented, but seems to have delayed processing and is slow to initiate movement today. Pt and RN report lack of appetite; also does not want Ensure or boost products. Pt requiring minimal assist for bed mobility/transfers and ambulating limited room distances with a Rollator. Requires assist for peri care and donning/doffing underwear. As she lives alone, updated recommendation to SNF, as she would need to be modified independent unless family was willing to stay. Pt reports she will talk with her family. Social worker updated.   If plan is discharge home, recommend the following: A little help with walking and/or transfers;A little help with bathing/dressing/bathroom;Assistance with cooking/housework;Assist for transportation;Help with stairs or ramp for entrance   Can travel by private vehicle     Yes  Equipment Recommendations  None recommended by PT    Recommendations for Other Services       Precautions / Restrictions Precautions Precautions: Fall Restrictions Weight Bearing Restrictions Per Provider Order: No     Mobility  Bed Mobility Overal bed mobility: Needs Assistance Bed Mobility: Supine to Sit     Supine to sit: Min assist     General bed mobility comments: Slow to initiate movement, assist for truncal assist to upright    Transfers Overall transfer level: Needs assistance Equipment used: Rollator (4 wheels) Transfers: Sit to/from Stand, Bed to chair/wheelchair/BSC Sit to Stand: Min  assist Stand pivot transfers: Min assist         General transfer comment: MinA to boost up to standing, cues for locking Rollator prior to transition. Pivoting to [facility]    Ambulation/Gait Ambulation/Gait assistance: Contact guard assist Gait Distance (Feet): 20 Feet Assistive device: Rollator (4 wheels) Gait Pattern/deviations: Step-through pattern, Decreased stride length Gait velocity: decreased Gait velocity interpretation: <1.31 ft/sec, indicative of household ambulator   General Gait Details: CGA for stability, dynamic instability, slowed speed.   Stairs             Wheelchair Mobility     Tilt Bed    Modified Rankin (Stroke Patients Only)       Balance Overall balance assessment: Needs assistance Sitting-balance support: Feet supported Sitting balance-Leahy Scale: Good     Standing balance support: Bilateral upper extremity supported Standing balance-Leahy Scale: Poor Standing balance comment: reliant on RW                            Cognition Arousal: Alert Behavior During Therapy: WFL for tasks assessed/performed Overall Cognitive Status: Impaired/Different from baseline Area of Impairment: Following commands, Problem solving                       Following Commands: Follows one step commands with increased time     Problem Solving: Slow processing General Comments: A&Ox4, delayed processing        Exercises      General Comments        Pertinent Vitals/Pain      Home Living  Prior Function            PT Goals (current goals can now be found in the care plan section) Acute Rehab PT Goals Patient Stated Goal: return to independence PT Goal Formulation: With patient Time For Goal Achievement: 11/29/23 Potential to Achieve Goals: Good Progress towards PT goals: Progressing toward goals    Frequency    Min 1X/week      PT Plan      Co-evaluation               AM-PAC PT "6 Clicks" Mobility   Outcome Measure  Help needed turning from your back to your side while in a flat bed without using bedrails?: None Help needed moving from lying on your back to sitting on the side of a flat bed without using bedrails?: A Little Help needed moving to and from a bed to a chair (including a wheelchair)?: A Little Help needed standing up from a chair using your arms (e.g., wheelchair or bedside chair)?: A Little Help needed to walk in hospital room?: A Little Help needed climbing 3-5 steps with a railing? : A Lot 6 Click Score: 18    End of Session Equipment Utilized During Treatment: Gait belt Activity Tolerance: Patient tolerated treatment well Patient left: in chair;with call bell/phone within reach Nurse Communication: Mobility status PT Visit Diagnosis: Unsteadiness on feet (R26.81);Difficulty in walking, not elsewhere classified (R26.2)     Time: 8413-2440 PT Time Calculation (min) (ACUTE ONLY): 37 min  Charges:    $Therapeutic Activity: 23-37 mins PT General Charges $$ ACUTE PT VISIT: 1 Visit                     Lillia Pauls, PT, DPT Acute Rehabilitation Services Office [phone]    Norval Morton 11/18/2023, 12:54 PM

## 2023-11-18 NOTE — Progress Notes (Signed)
   11/18/23 0942  Mobility  Activity Refused mobility  Mobility Specialist Start Time (ACUTE ONLY) 0942  Mobility Specialist Stop Time (ACUTE ONLY) 0946  Mobility Specialist Time Calculation (min) (ACUTE ONLY) 4 min   Mobility Specialist: Progress Note  Pt refused mobility session d/t  "feeling tired and has a headache" - Received and left in bed with all needs met. Call bell within reach. MS will follow up as time permits.   Barnie Mort, BS Mobility Specialist Please contact via SecureChat or Rehab office at [phone].

## 2023-11-18 NOTE — Consult Note (Signed)
[facility]    Date of Admission:  11/12/2023     Reason for Consult: colouterine fistula     Referring Provider: Lewie Chamber     Lines:  Peripheral iv's  Abx: 12/13-15; 12/19-c ceftriaxone 12/13-c metronidazole  12/15-19 cefepime        Assessment: 82 yo female admitted with weeks of lower abd pain and progressive malaise/poor appetite found to have endometritis  Ct no intraabd abscess outside of adherance of sigmoid colon (with diverticulosis) to uterine. Uterine I&D confirmed endometritis. Path no malignancy  12/16 operative cx strep constellatus, ecoli, bacteroides  Bcx negative   Would be ok to do oral abx  Plan: Plan 4 weeks of oral abx; will see sooner than that to see if can stop Can transition continue ceftriaxone/flagyl in house and transition to amox-clav 875 mg po bid; plan 28 days from 12/16-1/13/25 Id clinic set up for 12/07/23 @ 330 with me Id will sign off Discussed with primary team          ------------------------------------------------ Principal Problem:   Endometritis Active Problems:   Essential hypertension   Hyperlipidemia   Acute cystitis   Leukocytosis   Acute anemia   Dehydration   Moderate persistent asthma   Paroxysmal atrial fibrillation with RVR (HCC)    HPI: Heather Grant is a 82 y.o. female admitted with weeks of lower abd pain and progressive malaise/poor appetite found to have endometritis   She doesn't know if she had ever done colonosocopy and denies having prior abd infection  On admission day noticed nonbloody vaginal discharge  Endorse some weight loss over the past several weeks but not sure how much  No fever/chill at home   Admission vitals/labs Low grade fever 100.3 Wbc 45 Blood cx negative Imaging below suggest endometritis and colouterine fistula in setting diverticulosis  S/p uterine I&D 12/16 by obgyn: Findings: Pus appearing fluid in  uterine cavity sent for anaerobic and aerobic culture.  Multiple skin tags on right labia, largest measuring about 1cm. Operative cx bacteroides, strep constellatus, ecoli  Pathology no malignant cells  Wbc down to 20s Afebrile Tolerating abx     Family History  Problem Relation Age of Onset   Hypertension Mother    Stroke Father    Lung cancer Sister        smoked   Asthma Son     Social History   Tobacco Use   Smoking status: Never   Smokeless tobacco: Never  Vaping Use   Vaping status: Never Used  Substance Use Topics   Alcohol use: Yes    Comment: rare   Drug use: No    No Known Allergies  Review of Systems: ROS All Other ROS was negative, except mentioned above   Past Medical History:  Diagnosis Date   Arthritis    hands and knees. May be hips   Gout    great toe, ankles   Hyperlipidemia    Hypertension    Migraines    Moderate persistent asthma    Nocturnal dyspnea    ONO 2010 with several readings  below 88%   Obesity, Class II, BMI 35-39.9    OSA (obstructive sleep apnea)    mild by study Dec '09. AHI 5.7   Plantar fasciitis    Varicella        Scheduled Meds:  feeding supplement  237 mL Oral TID BM   latanoprost  1 drop Both  Eyes QHS   metoprolol tartrate  12.5 mg Oral BID   mometasone-formoterol  2 puff Inhalation BID   potassium chloride  20 mEq Oral Once   potassium chloride  40 mEq Oral Q4H   rosuvastatin  20 mg Oral Daily   Continuous Infusions:  ceFEPime (MAXIPIME) IV 2 g (11/18/23 0826)   dextrose 5 % and 0.2 % NaCl 100 mL/hr at 11/17/23 2243   magnesium sulfate bolus IVPB 2 g (11/18/23 1030)   metronidazole 500 mg (11/18/23 0904)   PRN Meds:.acetaminophen **OR** acetaminophen, albuterol, fentaNYL (SUBLIMAZE) injection, melatonin, metoprolol tartrate, naLOXone (NARCAN)  injection, ondansetron (ZOFRAN) IV, mouth rinse, phenol   OBJECTIVE: Blood pressure (!) 134/56, pulse (!) 56, temperature 97.8 F (36.6 C), temperature  source Oral, resp. rate 17, height 4\' 11"  (1.499 m), weight 79 kg, SpO2 100%.  Physical Exam  General/constitutional: no distress, pleasant HEENT: Normocephalic, PER, Conj Clear, EOMI, Oropharynx clear Neck supple CV: rrr no mrg Lungs: clear to auscultation, normal respiratory effort Abd: Soft, Nontender Ext: no edema Skin: No Rash Neuro: nonfocal MSK: no peripheral joint swelling/tenderness/warmth; back spines nontender    Lab Results Lab Results  Component Value Date   WBC 21.9 (H) 11/18/2023   HGB 9.7 (L) 11/18/2023   HCT 30.8 (L) 11/18/2023   MCV 96.9 11/18/2023   PLT 405 (H) 11/18/2023    Lab Results  Component Value Date   CREATININE 1.07 (H) 11/18/2023   BUN 6 (L) 11/18/2023   NA 137 11/18/2023   K 2.8 (L) 11/18/2023   CL 110 11/18/2023   CO2 22 11/18/2023    Lab Results  Component Value Date   ALT 15 11/14/2023   AST 18 11/14/2023   ALKPHOS 81 11/14/2023   BILITOT 0.9 11/14/2023      Microbiology: Recent Results (from the past 240 hours)  Urine Culture     Status: Abnormal   Collection Time: 11/12/23  1:02 PM   Specimen: Urine, Random  Result Value Ref Range Status   Specimen Description   Final    URINE, RANDOM Performed at [facility], [address], [place], [place] [address]    Special Requests   Final    NONE Reflexed from [id number] Performed at [facility], [address], [place], [place] [address]    Culture >=100,000 COLONIES/mL ESCHERICHIA COLI (A)  Final   Report Status 11/17/2023 FINAL  Final   Organism ID, Bacteria ESCHERICHIA COLI (A)  Final      Susceptibility   Escherichia coli - MIC*    AMPICILLIN 4 SENSITIVE Sensitive     CEFAZOLIN <=4 SENSITIVE Sensitive     CEFEPIME <=0.12 SENSITIVE Sensitive     CEFTRIAXONE <=0.25 SENSITIVE Sensitive     CIPROFLOXACIN <=0.25 SENSITIVE Sensitive     GENTAMICIN <=1 SENSITIVE Sensitive     IMIPENEM <=0.25 SENSITIVE Sensitive     NITROFURANTOIN <=16  SENSITIVE Sensitive     TRIMETH/SULFA <=20 SENSITIVE Sensitive     AMPICILLIN/SULBACTAM <=2 SENSITIVE Sensitive     PIP/TAZO <=4 SENSITIVE Sensitive ug/mL    * >=100,000 COLONIES/mL ESCHERICHIA COLI  Blood culture (routine x 2)     Status: None   Collection Time: 11/12/23  5:15 PM   Specimen: BLOOD  Result Value Ref Range Status   Specimen Description   Final    BLOOD RIGHT ANTECUBITAL Performed at [facility], [address], [place], [place] [address]    Special Requests   Final    Blood Culture  results may not be optimal due to an inadequate volume of blood received in culture bottles BOTTLES DRAWN AEROBIC AND ANAEROBIC Performed at [facility], [address] [place], [place] [address]    Culture   Final    NO GROWTH 5 DAYS Performed at [facility], [address] [place], [place] [address]    Report Status 11/17/2023 FINAL  Final  Wet prep, genital     Status: None   Collection Time: 11/13/23  9:24 AM   Specimen: PATH Cytology Cervicovaginal Ancillary Only  Result Value Ref Range Status   Yeast Wet Prep HPF POC NONE SEEN NONE SEEN Final   Trich, Wet Prep NONE SEEN NONE SEEN Final   Clue Cells Wet Prep HPF POC NONE SEEN NONE SEEN Final   WBC, Wet Prep HPF POC <10 <10 Final   Sperm NONE SEEN  Final    Comment: Performed at [facility], [address], [place], [place] [address]  Surgical pcr screen     Status: None   Collection Time: 11/14/23 10:18 PM   Specimen: Nasal Mucosa; Nasal Swab  Result Value Ref Range Status   MRSA, PCR NEGATIVE NEGATIVE Final   Staphylococcus aureus NEGATIVE NEGATIVE Final    Comment: (NOTE) The Xpert SA Assay (FDA approved for NASAL specimens in patients 44 years of age and older), is one component of a comprehensive surveillance program. It is not intended to diagnose infection nor to guide or monitor treatment. Performed at [facility], [address], [place],  [place] [address]   Aerobic/Anaerobic Culture w Gram Stain (surgical/deep wound)     Status: None (Preliminary result)   Collection Time: 11/15/23  7:23 PM   Specimen: Uterus and Cervix; GYN  Result Value Ref Range Status   Specimen Description UTERUS  Final   Special Requests UTERINE CAVITY FLUID  Final   Gram Stain   Final    FEW WBC PRESENT, PREDOMINANTLY PMN FEW GRAM POSITIVE COCCI    Culture   Final    RARE ESCHERICHIA COLI FEW STREPTOCOCCUS CONSTELLATUS SUSCEPTIBILITIES TO FOLLOW HOLDING FOR POSSIBLE ANAEROBE Performed at [facility], [address] [place], [place] [address]    Report Status PENDING  Incomplete   Organism ID, Bacteria ESCHERICHIA COLI  Final      Susceptibility   Escherichia coli - MIC*    AMPICILLIN >=32 RESISTANT Resistant     CEFEPIME <=0.12 SENSITIVE Sensitive     CEFTAZIDIME <=1 SENSITIVE Sensitive     CEFTRIAXONE <=0.25 SENSITIVE Sensitive     CIPROFLOXACIN <=0.25 SENSITIVE Sensitive     GENTAMICIN <=1 SENSITIVE Sensitive     IMIPENEM <=0.25 SENSITIVE Sensitive     TRIMETH/SULFA <=20 SENSITIVE Sensitive     AMPICILLIN/SULBACTAM 8 SENSITIVE Sensitive     PIP/TAZO <=4 SENSITIVE Sensitive ug/mL    * RARE ESCHERICHIA COLI     Serology:    Imaging: If present, new imagings (plain films, ct scans, and mri) have been personally visualized and interpreted; radiology reports have been reviewed. Decision making incorporated into the Impression / Recommendations.  12/13 pelvis u/s Poorly visible uterus due to dirty shadowing from air. On endovaginal images and cine clips, large gas and fluid collection at the midline pelvis presumably corresponding to the gas and fluid collection on CT which appears to be intrauterine in location, concerning for intrauterine abscess. CT demonstrated potential colo uterine fistula as well.   Nonvisualized ovaries  11/12/23 ct abd pelv  1. Frothy debris with air-fluid level within the uterus concerning for  endometritis. Pelvic ultrasound and gynecology consult is recommended. 2. Question colo-uterine fistula between the sigmoid colon and posterior right uterus.  12/14 mri pelvis 1. Moderate sigmoid diverticulosis. 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 2. Markedly distended uterine cavity by heterogeneous fluid with layering air anteriorly, compatible with endometritis/uterine cavity abscess. 3. Normal ovaries. 4. Trace simple free fluid in the pelvic cul-de-sac.  12/16 pelvis tissue pathology A. ENDOMETRIUM, BIOPSY:       Predominant degenerative and necrotic tissue with inflammatory cells.       No viable epithelium presents for evaluation.       No evidence of malignancy.        Raymondo Band, MD [facility] [phone] pager    11/18/2023, 10:51 AM

## 2023-11-19 ENCOUNTER — Other Ambulatory Visit: Payer: Self-pay | Admitting: Internal Medicine

## 2023-11-19 DIAGNOSIS — N3000 Acute cystitis without hematuria: Secondary | ICD-10-CM | POA: Diagnosis not present

## 2023-11-19 DIAGNOSIS — I480 Paroxysmal atrial fibrillation: Secondary | ICD-10-CM | POA: Diagnosis not present

## 2023-11-19 DIAGNOSIS — J45901 Unspecified asthma with (acute) exacerbation: Secondary | ICD-10-CM

## 2023-11-19 DIAGNOSIS — N719 Inflammatory disease of uterus, unspecified: Secondary | ICD-10-CM | POA: Diagnosis not present

## 2023-11-19 LAB — CBC WITH DIFFERENTIAL/PLATELET
Abs Immature Granulocytes: 4.76 10*3/uL — ABNORMAL HIGH (ref 0.00–0.07)
Basophils Absolute: 0.1 10*3/uL (ref 0.0–0.1)
Basophils Relative: 0 %
Eosinophils Absolute: 0.6 10*3/uL — ABNORMAL HIGH (ref 0.0–0.5)
Eosinophils Relative: 3 %
HCT: 31.8 % — ABNORMAL LOW (ref 36.0–46.0)
Hemoglobin: 10.2 g/dL — ABNORMAL LOW (ref 12.0–15.0)
Immature Granulocytes: 23 %
Lymphocytes Relative: 15 %
Lymphs Abs: 3.1 10*3/uL (ref 0.7–4.0)
MCH: 30.9 pg (ref 26.0–34.0)
MCHC: 32.1 g/dL (ref 30.0–36.0)
MCV: 96.4 fL (ref 80.0–100.0)
Monocytes Absolute: 1.6 10*3/uL — ABNORMAL HIGH (ref 0.1–1.0)
Monocytes Relative: 8 %
Neutro Abs: 10.8 10*3/uL — ABNORMAL HIGH (ref 1.7–7.7)
Neutrophils Relative %: 51 %
Platelets: 441 10*3/uL — ABNORMAL HIGH (ref 150–400)
RBC: 3.3 MIL/uL — ABNORMAL LOW (ref 3.87–5.11)
RDW: 13.8 % (ref 11.5–15.5)
Smear Review: INCREASED
WBC: 21 10*3/uL — ABNORMAL HIGH (ref 4.0–10.5)
nRBC: 0 % (ref 0.0–0.2)

## 2023-11-19 LAB — BASIC METABOLIC PANEL
Anion gap: 6 (ref 5–15)
BUN: 6 mg/dL — ABNORMAL LOW (ref 8–23)
CO2: 22 mmol/L (ref 22–32)
Calcium: 9.1 mg/dL (ref 8.9–10.3)
Chloride: 112 mmol/L — ABNORMAL HIGH (ref 98–111)
Creatinine, Ser: 1 mg/dL (ref 0.44–1.00)
GFR, Estimated: 57 mL/min — ABNORMAL LOW (ref >60)
Glucose, Bld: 94 mg/dL (ref 70–99)
Potassium: 4.3 mmol/L (ref 3.5–5.1)
Sodium: 140 mmol/L (ref 135–145)

## 2023-11-19 LAB — MAGNESIUM: Magnesium: 1.8 mg/dL (ref 1.7–2.4)

## 2023-11-19 MED ORDER — AMOXICILLIN-POT CLAVULANATE 875-125 MG PO TABS
1.0000 | ORAL_TABLET | Freq: Two times a day (BID) | ORAL | Status: DC
  Administered 2023-11-19: 1 via ORAL
  Filled 2023-11-19: qty 1

## 2023-11-19 MED ORDER — METRONIDAZOLE 500 MG/100ML IV SOLN
500.0000 mg | Freq: Two times a day (BID) | INTRAVENOUS | Status: DC
  Administered 2023-11-19 – 2023-11-25 (×13): 500 mg via INTRAVENOUS
  Filled 2023-11-19 (×13): qty 100

## 2023-11-19 MED ORDER — SODIUM CHLORIDE 0.9 % IV SOLN
2.0000 g | INTRAVENOUS | Status: DC
  Administered 2023-11-19 – 2023-11-25 (×7): 2 g via INTRAVENOUS
  Filled 2023-11-19 (×7): qty 20

## 2023-11-19 NOTE — Evaluation (Signed)
Occupational Therapy Evaluation Patient Details Name: Heather Grant MRN: [id number] DOB: 1941/11/13 Today's Date: 11/19/2023   History of Present Illness Pt is a 82 y.o. F who presents 11/12/2023 with lower abdominal pain and possible endometriosis as evident on CT of abdomen and pelvis. MRI pelvis showing moderate sigmoid diverticulosis. Significant PMH: HLD, moderate persistent asthma.   Clinical Impression   Pt admitted for above, she presents as generally weak and demonstrates decreased activity tolerance. Per family report pt at risk of declining at home due to over reliance on lift chair and not being as active at home. Pt able to ambulate with CGA, with some mild unsteadiness, also needs Max to setup assist for ADLs seated EOB. Pt would benefit from continued acute skilled OT services to address listed deficits and help increase activity tolerance or promote energy conservation based on pt needs. Patient would benefit from post acute skilled rehab facility with <3 hours of therapy and 24/7 support       If plan is discharge home, recommend the following: A little help with walking and/or transfers;A little help with bathing/dressing/bathroom;Assistance with cooking/housework    Functional Status Assessment  Patient has had a recent decline in their functional status and demonstrates the ability to make significant improvements in function in a reasonable and predictable amount of time.  Equipment Recommendations  BSC/3in1    Recommendations for Other Services       Precautions / Restrictions Precautions Precautions: Fall Restrictions Weight Bearing Restrictions Per Provider Order: No      Mobility Bed Mobility Overal bed mobility: Needs Assistance Bed Mobility: Supine to Sit, Sit to Supine     Supine to sit: Supervision, HOB elevated, Used rails Sit to supine: Min assist   General bed mobility comments: Min A to help with returning legs to supine     Transfers Overall transfer level: Needs assistance Equipment used: Rolling walker (2 wheels) Transfers: Sit to/from Stand Sit to Stand: Min assist           General transfer comment: Min A to help stedy while transitioning hands on RW      Balance Overall balance assessment: Needs assistance Sitting-balance support: Feet supported Sitting balance-Leahy Scale: Good Sitting balance - Comments: EOB   Standing balance support: Bilateral upper extremity supported Standing balance-Leahy Scale: Poor Standing balance comment: reliant on RW                           ADL either performed or assessed with clinical judgement   ADL Overall ADL's : Needs assistance/impaired Eating/Feeding: Independent;Sitting   Grooming: Sitting;Set up   Upper Body Bathing: Sitting;Set up   Lower Body Bathing: Sitting/lateral leans;Minimal assistance   Upper Body Dressing : Sitting;Set up   Lower Body Dressing: Sitting/lateral leans;Maximal assistance Lower Body Dressing Details (indicate cue type and reason): wears slip on shoes at baseline Toilet Transfer: Ambulation;Rolling walker (2 wheels);Contact guard assist   Toileting- Clothing Manipulation and Hygiene: Contact guard assist;Sitting/lateral lean       Functional mobility during ADLs: Contact guard assist;Rolling walker (2 wheels) General ADL Comments: pt ambulated 63ft in hall/room after performing EOB exercises     Vision         Perception         Praxis         Pertinent Vitals/Pain Pain Assessment Pain Assessment: No/denies pain     Extremity/Trunk Assessment Upper Extremity Assessment Upper Extremity Assessment: Generalized weakness (BUE ROM  110* shoulder flexion with strength 3+/5)   Lower Extremity Assessment Lower Extremity Assessment: Generalized weakness       Communication     Cognition Arousal: Alert Behavior During Therapy: WFL for tasks assessed/performed Overall Cognitive Status:  Within Functional Limits for tasks assessed                                       General Comments  VSS    Exercises General Exercises - Lower Extremity Long Arc Quad: AROM, Both, 10 reps, Seated (isometrics from OT) Hip ABduction/ADduction: AROM, Both, 10 reps, Seated Hip Flexion/Marching: AROM, Both, 10 reps, Seated (isometrics from OT) Other Exercises Other Exercises: flutter kicks seated EOB   Shoulder Instructions      Home Living Family/patient expects to be discharged to:: Private residence Living Arrangements: Alone Available Help at Discharge: Family;Available PRN/intermittently (3 children that live close by, 1 son in [place]) Type of Home: Apartment Home Access: Level entry     Home Layout: One level     Bathroom Shower/Tub: Chief Strategy Officer: Standard     Home Equipment: Cane - single Librarian, academic (2 wheels);Rollator (4 wheels)          Prior Functioning/Environment Prior Level of Function : Independent/Modified Independent             Mobility Comments: uses cane vs Rollator ADLs Comments: Transfers into tub/shower by sitting on stool and then gets down into tub. Pt granddaughter brings her meals sometimes. Ind for ADLs        OT Problem List: Impaired balance (sitting and/or standing);Decreased activity tolerance;Decreased strength      OT Treatment/Interventions: Self-care/ADL training;Therapeutic exercise;Therapeutic activities;DME and/or AE instruction;Balance training;Patient/family education    OT Goals(Current goals can be found in the care plan section) Acute Rehab OT Goals Patient Stated Goal: To get stronger OT Goal Formulation: With patient/family Time For Goal Achievement: 12/03/23 Potential to Achieve Goals: Good ADL Goals Pt Will Perform Grooming: with supervision;standing Pt Will Transfer to Toilet: with supervision;ambulating Pt Will Perform Toileting - Clothing Manipulation and hygiene:  with supervision;sit to/from stand Pt Will Perform Tub/Shower Transfer: Tub transfer;with supervision;ambulating Pt/caregiver will Perform Home Exercise Program: Increased strength;Both right and left upper extremity;With theraband;With written HEP provided;With Supervision  OT Frequency: Min 1X/week    Co-evaluation              AM-PAC OT "6 Clicks" Daily Activity     Outcome Measure Help from another person eating meals?: None Help from another person taking care of personal grooming?: A Little Help from another person toileting, which includes using toliet, bedpan, or urinal?: A Little Help from another person bathing (including washing, rinsing, drying)?: A Little Help from another person to put on and taking off regular upper body clothing?: A Little Help from another person to put on and taking off regular lower body clothing?: A Lot 6 Click Score: 18   End of Session Equipment Utilized During Treatment: Gait belt;Rolling walker (2 wheels) Nurse Communication: Mobility status  Activity Tolerance: Patient tolerated treatment well Patient left: in bed;with call bell/phone within reach;with family/visitor present;with bed alarm set  OT Visit Diagnosis: Unsteadiness on feet (R26.81);Other abnormalities of gait and mobility (R26.89)                Time: 4132-4401 OT Time Calculation (min): 35 min Charges:  OT General Charges $OT Visit: 1  Visit OT Evaluation $OT Eval Low Complexity: 1 Low OT Treatments $Therapeutic Activity: 8-22 mins  11/19/2023  AB, OTR/L  Acute Rehabilitation Services  Office: [phone]   Tristan Schroeder 11/19/2023, 6:52 PM

## 2023-11-19 NOTE — Progress Notes (Signed)
Progress Note    Heather Grant   VOZ:[id number]  DOB: 1941-02-12  DOA: 11/12/2023     7 PCP: Myrlene Broker, MD  Initial CC: abd pain  Hospital Course: Heather Grant is a 82 y.o. female with medical history significant for essential pretension, hyperlipidemia, moderate persistent asthma, who is admitted to [facility] on 11/12/2023 by way of transfer from [facility] with endometritis and concern for a colo-uterine fistula.  She was started on antibiotics on admission and underwent consultation with OB/GYN.  She underwent cervical dilation on 11/15/2023.  Interval History:  No events overnight.  Just appears to be overall uncomfortable this morning. She has converted back to normal sinus rhythm. Called and updated Heather Grant this morning as well.   Assessment and Plan:  Endometritis -Patient underwent CT abdomen/pelvis, pelvic ultrasound, MRI pelvis on admission - Results consistent with endometritis with posterior uterine body wall densely adherent to the distal sigmoid colon with suspected 1 cm in length Colo uterine fistula - s/p cervical dilation on 11/15/2023 - continue Rocephin and flagyl - OB/GYN following as well; has recommended outpatient follow up with GYN-onc to discuss surgical management - has also been evaluated by general surgery inpatient and might also see colorectal at discharge too? Vs inpatient workup; still TBD - Ucx with E. Coli  - uterine fluid culture: E. coli, Streptococcus constellatus, Bacteroides fragilis -Evaluated by ID.  Tentative Augmentin at discharge but if remains inpatient will keep on IV abx for now until more definitive plan with surgery   PAF with [facility] - new onset afib on 12/18; suspect precipitated in setting of infection - hold off on [facility] at this time -Home Lopressor already been resumed but now brady - converted back to NSR on 12/19; confirmed with EKG 12/20 - hold further lopressor for now   Acute  cystitis Pending urine cultures. On IV abx -Urine growing pansensitive E. coli, see above   Hypokalemia Replaced, repeat level wnl.    Normocytic anemia Continue to follow   Essential hypertension Blood pressure parameters are well controlled.    Hyperlipidemia Patient on Crestor, continue the same   Asthma Continue with Dulera twice daily   GERD Stable     Estimated body mass index is 33.66 kg/m as calculated from the following:   Height as of this encounter: 4\' 11"  (1.499 m).   Weight as of this encounter: 75.6 kg.    Old records reviewed in assessment of this patient  Antimicrobials: Cefepime 11/14/2023 >> 12/19 Rocephin 12/19 >> current  Flagyl 11/12/2023 >> current  DVT prophylaxis:  SCDs Start: 11/12/23 2110   Code Status:   Code Status: Full Code  Mobility Assessment (Last 72 Hours)     Mobility Assessment     Row Name 11/19/23 1029 11/18/23 1945 11/18/23 1010 11/18/23 0920 11/17/23 1945   Does patient have an order for bedrest or is patient medically unstable -- No - Continue assessment -- No - Continue assessment No - Continue assessment   What is the highest level of mobility based on the progressive mobility assessment? Level 5 (Walks with assist in room/hall) - Balance while stepping forward/back and can walk in room with assist - Complete Level 5 (Walks with assist in room/hall) - Balance while stepping forward/back and can walk in room with assist - Complete Level 5 (Walks with assist in room/hall) - Balance while stepping forward/back and can walk in room with assist - Complete Level 5 (Walks with assist in room/hall) - Balance  while stepping forward/back and can walk in room with assist - Complete Level 5 (Walks with assist in room/hall) - Balance while stepping forward/back and can walk in room with assist - Complete   Is the above level different from baseline mobility prior to current illness? -- Yes - Recommend PT order -- Yes - Recommend PT order  Yes - Recommend PT order    Row Name 11/17/23 1600 11/17/23 0830 11/16/23 2013       Does patient have an order for bedrest or is patient medically unstable -- No - Continue assessment No - Continue assessment     What is the highest level of mobility based on the progressive mobility assessment? Level 5 (Walks with assist in room/hall) - Balance while stepping forward/back and can walk in room with assist - Complete Level 5 (Walks with assist in room/hall) - Balance while stepping forward/back and can walk in room with assist - Complete Level 3 (Stands with assist) - Balance while standing  and cannot march in place     Is the above level different from baseline mobility prior to current illness? -- Yes - Recommend PT order Yes - Recommend PT order              Barriers to discharge: none Disposition Plan:  Home Status is: Inpt  Objective: Blood pressure (!) 152/72, pulse (!) 50, temperature 98.3 F (36.8 C), temperature source Oral, resp. rate 17, height 4\' 11"  (1.499 m), weight 80.5 kg, SpO2 97%.  Examination:  Physical Exam Constitutional:      Appearance: Normal appearance.  HENT:     Head: Normocephalic and atraumatic.     Mouth/Throat:     Mouth: Mucous membranes are moist.  Eyes:     Extraocular Movements: Extraocular movements intact.  Cardiovascular:     Rate and Rhythm: Normal rate and regular rhythm.  Pulmonary:     Effort: Pulmonary effort is normal. No respiratory distress.     Breath sounds: Normal breath sounds. No wheezing.  Abdominal:     General: Bowel sounds are normal. There is no distension.     Palpations: Abdomen is soft.     Tenderness: There is no abdominal tenderness.  Musculoskeletal:        General: Normal range of motion.     Cervical back: Normal range of motion and neck supple.  Skin:    General: Skin is warm and dry.  Neurological:     General: No focal deficit present.     Mental Status: She is alert.  Psychiatric:        Mood and  Affect: Mood normal.      Consultants:  Ob/Gyn General surgery  Procedures:  11/15/2023: Cervical dilation  Data Reviewed: Results for orders placed or performed during the hospital encounter of 11/12/23 (from the past 24 hours)  Basic metabolic panel     Status: Abnormal   Collection Time: 11/19/23  6:43 AM  Result Value Ref Range   Sodium 140 135 - 145 mmol/L   Potassium 4.3 3.5 - 5.1 mmol/L   Chloride 112 (H) 98 - 111 mmol/L   CO2 22 22 - 32 mmol/L   Glucose, Bld 94 70 - 99 mg/dL   BUN 6 (L) 8 - 23 mg/dL   Creatinine, Ser 1.61 0.44 - 1.00 mg/dL   Calcium 9.1 8.9 - 09.6 mg/dL   GFR, Estimated 57 (L) >60 mL/min   Anion gap 6 5 - 15  CBC with Differential/Platelet  Status: Abnormal   Collection Time: 11/19/23  6:43 AM  Result Value Ref Range   WBC 21.0 (H) 4.0 - 10.5 K/uL   RBC 3.30 (L) 3.87 - 5.11 MIL/uL   Hemoglobin 10.2 (L) 12.0 - 15.0 g/dL   HCT 96.0 (L) 45.4 - 09.8 %   MCV 96.4 80.0 - 100.0 fL   MCH 30.9 26.0 - 34.0 pg   MCHC 32.1 30.0 - 36.0 g/dL   RDW 11.9 14.7 - 82.9 %   Platelets 441 (H) 150 - 400 K/uL   nRBC 0.0 0.0 - 0.2 %   Neutrophils Relative % 51 %   Neutro Abs 10.8 (H) 1.7 - 7.7 K/uL   Lymphocytes Relative 15 %   Lymphs Abs 3.1 0.7 - 4.0 K/uL   Monocytes Relative 8 %   Monocytes Absolute 1.6 (H) 0.1 - 1.0 K/uL   Eosinophils Relative 3 %   Eosinophils Absolute 0.6 (H) 0.0 - 0.5 K/uL   Basophils Relative 0 %   Basophils Absolute 0.1 0.0 - 0.1 K/uL   WBC Morphology Moderate Left Shift (>5% metas and myelos)    RBC Morphology MORPHOLOGY UNREMARKABLE    Smear Review PLATELETS APPEAR INCREASED    Immature Granulocytes 23 %   Abs Immature Granulocytes 4.76 (H) 0.00 - 0.07 K/uL  Magnesium     Status: None   Collection Time: 11/19/23  6:43 AM  Result Value Ref Range   Magnesium 1.8 1.7 - 2.4 mg/dL    I have reviewed pertinent nursing notes, vitals, labs, and images as necessary. I have ordered labwork to follow up on as indicated.  I have  reviewed the last notes from staff over past 24 hours. I have discussed patient's care plan and test results with nursing staff, CM/SW, and other staff as appropriate.  Time spent: Greater than 50% of the 55 minute visit was spent in counseling/coordination of care for the patient as laid out in the A&P.   LOS: 7 days   Lewie Chamber, MD [facility] 11/19/2023, 3:28 PM

## 2023-11-19 NOTE — Progress Notes (Signed)
  4 Days Post-Op   Chief Complaint/Subjective: Episode of emesis last evening after ensure. No nausea this am. No abdominal pain. She has low appetite and does not feel like eating. She is having vaginal discharge requiring her to change her pad 4 times yesterday with one episode being significant  Objective: Vital signs in last 24 hours: Temp:  [97.9 F (36.6 C)-98.3 F (36.8 C)] 98.3 F (36.8 C) (12/20 0421) Pulse Rate:  [49-72] 50 (12/20 0952) Resp:  [15-18] 17 (12/20 0906) BP: (121-156)/(53-72) 152/72 (12/20 0952) SpO2:  [97 %-100 %] 97 % (12/20 0906) Weight:  [80.5 kg] 80.5 kg (12/20 0421) Last BM Date : 11/18/23 Intake/Output from previous day: 12/19 0701 - 12/20 0700 In: 1484.3 [P.O.:800; I.V.:242.5; IV Piggyback:441.8] Out: -   PE: Gen: NAD Resp: nonlabored on room air Abd: soft, NT  Lab Results:  Recent Labs    11/18/23 0620 11/19/23 0643  WBC 21.9* 21.0*  HGB 9.7* 10.2*  HCT 30.8* 31.8*  PLT 405* 441*   Recent Labs    11/18/23 0620 11/19/23 0643  NA 137 140  K 2.8* 4.3  CL 110 112*  CO2 22 22  GLUCOSE 129* 94  BUN 6* 6*  CREATININE 1.07* 1.00  CALCIUM 8.5* 9.1   No results for input(s): "LABPROT", "INR" in the last 72 hours.    Component Value Date/Time   NA 140 11/19/2023 0643   K 4.3 11/19/2023 0643   CL 112 (H) 11/19/2023 0643   CO2 22 11/19/2023 0643   GLUCOSE 94 11/19/2023 0643   BUN 6 (L) 11/19/2023 0643   CREATININE 1.00 11/19/2023 0643   CALCIUM 9.1 11/19/2023 0643   PROT 5.0 (L) 11/14/2023 0539   ALBUMIN 2.1 (L) 11/14/2023 0539   AST 18 11/14/2023 0539   ALT 15 11/14/2023 0539   ALKPHOS 81 11/14/2023 0539   BILITOT 0.9 11/14/2023 0539   GFRNONAA 57 (L) 11/19/2023 0643   GFRAA 56 (L) 11/19/2012 1708    Assessment/Plan Colouterine fistula - culture from hysteroscopy with e coli. continue antibiotics - ID consulted and planning 4 weeks abx -WBC overall stable at 21 - had more vaginal drainage yesterday - we are discussing  plan with colorectal surgery and gynecology/oncology  Daughters not at bedside this am for discussion  FEN: reg ID: rocephin/flagyl VTE: okay for lovenox from surgical standpoint    LOS: 7 days   I reviewed last 24 h vitals and pain scores, last 48 h intake and output, last 24 h labs and trends, and last 24 h imaging results.   Francena Hanly [facility] 11/19/2023, 10:19 AM Please see Amion for pager number during day hours 7:00am-4:30pm or 7:00am -11:30am on weekends

## 2023-11-19 NOTE — Progress Notes (Signed)
4 Days Post-Op Procedure(s): Cervical Dilation  Subjective: Patient reports nausea, vomiting, and + flatus.    Objective: BP 130/60 (BP Location: Right Arm)   Pulse 65   Temp 98.2 F (36.8 C) (Oral)   Resp 16   Ht 4\' 11"  (1.499 m)   Wt 80.5 kg   SpO2 100%   BMI 35.84 kg/m   General: alert and cooperative GI: soft, non-tender; bowel sounds normal; no masses,  no organomegaly Extremities: Homans sign is negative, no sign of DVT Vaginal Bleeding: minimal  Assessment: s/p Procedure(s): Cervical Dilation: stable and path benign  Plan: Pt to F/U with Dr Sallye Ober as an outpatient GYN ONC as out pt  The pt and family would like a second opinion from Dr Cliffton Asters.  Referral put in by Dr Sallye Ober If the pt declines she may have surgery over the weekend.  If she is stable may wait for second opinion   LOS: 7 days    Heather Grant 11/19/2023, 6:52 PM

## 2023-11-19 NOTE — Progress Notes (Signed)
Physical Therapy Treatment Patient Details Name: Heather Grant MRN: [id number] DOB: [birth year] Today's Date: 11/19/2023   History of Present Illness Pt is a 82 y.o. F who presents 11/12/2023 with lower abdominal pain and possible endometriosis as evident on CT of abdomen and pelvis. MRI pelvis showing moderate sigmoid diverticulosis. Significant PMH: HLD, moderate persistent asthma.    PT Comments  Pt demonstrating improvement in maneuvering to edge of bed this session without assist, but overall requires min-mod assist for transfers and limited ambulation. Ambulating 30 ft x 2 with a Rollator and seated rest break in between bouts. Pt reports continued lack of appetite. Pt is at high risk of falling based on decreased gait speed and impaired standing balance. Due to deficits and decreased caregiver support, recommendation remains SNF. If pt has option for increased family support, may be able to transition home at d/c.   If plan is discharge home, recommend the following: A little help with walking and/or transfers;A little help with bathing/dressing/bathroom;Assistance with cooking/housework;Assist for transportation;Help with stairs or ramp for entrance   Can travel by private vehicle     Yes  Equipment Recommendations  None recommended by PT    Recommendations for Other Services       Precautions / Restrictions Precautions Precautions: Fall Restrictions Weight Bearing Restrictions Per Provider Order: No     Mobility  Bed Mobility Overal bed mobility: Needs Assistance Bed Mobility: Supine to Sit     Supine to sit: Supervision     General bed mobility comments: Progressing to edge of bed without physical assist this session    Transfers Overall transfer level: Needs assistance Equipment used: Rollator (4 wheels) Transfers: Sit to/from Stand Sit to Stand: Min assist, Mod assist           General transfer comment: Assist for locking brakes on Rollator prior  to transition; minA to stand from edge of bed, modA to stand from recliner. Verbal cueing for hand placement    Ambulation/Gait Ambulation/Gait assistance: Min assist Gait Distance (Feet): 60 Feet (30", 30") Assistive device: Rollator (4 wheels) Gait Pattern/deviations: Step-through pattern, Decreased stride length Gait velocity: decreased Gait velocity interpretation: <1.31 ft/sec, indicative of household ambulator   General Gait Details: Moderate reliance through arms on Rollator, slow and effortful pace, requires one seated rest break in between bouts   Stairs             Wheelchair Mobility     Tilt Bed    Modified Rankin (Stroke Patients Only)       Balance Overall balance assessment: Needs assistance Sitting-balance support: Feet supported Sitting balance-Leahy Scale: Good     Standing balance support: Bilateral upper extremity supported Standing balance-Leahy Scale: Poor Standing balance comment: reliant on RW                            Cognition Arousal: Alert Behavior During Therapy: WFL for tasks assessed/performed Overall Cognitive Status: Impaired/Different from baseline Area of Impairment: Following commands, Problem solving                       Following Commands: Follows one step commands with increased time     Problem Solving: Slow processing, Requires verbal cues General Comments: A&Ox4, mildly delayed processing, pleasant        Exercises      General Comments        Pertinent Vitals/Pain Pain Assessment Pain Assessment: No/denies pain  Home Living                          Prior Function            PT Goals (current goals can now be found in the care plan section) Acute Rehab PT Goals Patient Stated Goal: return to independence PT Goal Formulation: With patient Time For Goal Achievement: 11/29/23 Potential to Achieve Goals: Good Progress towards PT goals: Progressing toward goals     Frequency    Min 1X/week      PT Plan      Co-evaluation              AM-PAC PT "6 Clicks" Mobility   Outcome Measure  Help needed turning from your back to your side while in a flat bed without using bedrails?: None Help needed moving from lying on your back to sitting on the side of a flat bed without using bedrails?: A Little Help needed moving to and from a bed to a chair (including a wheelchair)?: A Little Help needed standing up from a chair using your arms (e.g., wheelchair or bedside chair)?: A Little Help needed to walk in hospital room?: A Little Help needed climbing 3-5 steps with a railing? : A Lot 6 Click Score: 18    End of Session Equipment Utilized During Treatment: Gait belt Activity Tolerance: Patient tolerated treatment well Patient left: in chair;with call bell/phone within reach Nurse Communication: Mobility status PT Visit Diagnosis: Unsteadiness on feet (R26.81);Difficulty in walking, not elsewhere classified (R26.2)     Time: 4098-1191 PT Time Calculation (min) (ACUTE ONLY): 20 min  Charges:    $Therapeutic Activity: 8-22 mins PT General Charges $$ ACUTE PT VISIT: 1 Visit                     Heather Grant, PT, DPT Acute Rehabilitation Services Office [phone]    Heather Grant 11/19/2023, 10:32 AM

## 2023-11-19 NOTE — Plan of Care (Signed)
  Problem: Clinical Measurements: Goal: Respiratory complications will improve Outcome: Progressing Goal: Cardiovascular complication will be avoided Outcome: Progressing   Problem: Activity: Goal: Risk for activity intolerance will decrease Outcome: Progressing   Problem: Nutrition: Goal: Adequate nutrition will be maintained Outcome: Not Progressing   Problem: Coping: Goal: Level of anxiety will decrease Outcome: Not Progressing

## 2023-11-19 NOTE — Progress Notes (Signed)
Assumed of care at 11 pm, bedside report received from [place], [place]. Denies pain or distress at this time. All safety measures in place. Assessment and POC completed.

## 2023-11-19 NOTE — Plan of Care (Signed)
  Problem: Clinical Measurements: Goal: Ability to maintain clinical measurements within normal limits will improve Outcome: Progressing   Problem: Clinical Measurements: Goal: Will remain free from infection Outcome: Progressing   Problem: Clinical Measurements: Goal: Diagnostic test results will improve Outcome: Progressing   Problem: Nutrition: Goal: Adequate nutrition will be maintained Outcome: Progressing   Problem: Coping: Goal: Level of anxiety will decrease Outcome: Progressing   Problem: Pain Management: Goal: General experience of comfort will improve Outcome: Progressing

## 2023-11-20 DIAGNOSIS — N3000 Acute cystitis without hematuria: Secondary | ICD-10-CM | POA: Diagnosis not present

## 2023-11-20 DIAGNOSIS — N719 Inflammatory disease of uterus, unspecified: Secondary | ICD-10-CM | POA: Diagnosis not present

## 2023-11-20 DIAGNOSIS — I480 Paroxysmal atrial fibrillation: Secondary | ICD-10-CM | POA: Diagnosis not present

## 2023-11-20 LAB — CBC WITH DIFFERENTIAL/PLATELET
Abs Immature Granulocytes: 0 10*3/uL (ref 0.00–0.07)
Basophils Absolute: 0.4 10*3/uL — ABNORMAL HIGH (ref 0.0–0.1)
Basophils Relative: 2 %
Eosinophils Absolute: 0.7 10*3/uL — ABNORMAL HIGH (ref 0.0–0.5)
Eosinophils Relative: 4 %
HCT: 30 % — ABNORMAL LOW (ref 36.0–46.0)
Hemoglobin: 9.6 g/dL — ABNORMAL LOW (ref 12.0–15.0)
Lymphocytes Relative: 15 %
Lymphs Abs: 2.8 10*3/uL (ref 0.7–4.0)
MCH: 30.9 pg (ref 26.0–34.0)
MCHC: 32 g/dL (ref 30.0–36.0)
MCV: 96.5 fL (ref 80.0–100.0)
Monocytes Absolute: 1.8 10*3/uL — ABNORMAL HIGH (ref 0.1–1.0)
Monocytes Relative: 10 %
Neutro Abs: 12.7 10*3/uL — ABNORMAL HIGH (ref 1.7–7.7)
Neutrophils Relative %: 69 %
Platelets: 423 10*3/uL — ABNORMAL HIGH (ref 150–400)
RBC: 3.11 MIL/uL — ABNORMAL LOW (ref 3.87–5.11)
RDW: 13.9 % (ref 11.5–15.5)
WBC: 18.4 10*3/uL — ABNORMAL HIGH (ref 4.0–10.5)
nRBC: 0 % (ref 0.0–0.2)
nRBC: 0 /100{WBCs}

## 2023-11-20 LAB — MAGNESIUM: Magnesium: 1.8 mg/dL (ref 1.7–2.4)

## 2023-11-20 LAB — SURGICAL PCR SCREEN
MRSA, PCR: NEGATIVE
Staphylococcus aureus: NEGATIVE

## 2023-11-20 LAB — BASIC METABOLIC PANEL
Anion gap: 3 — ABNORMAL LOW (ref 5–15)
BUN: 6 mg/dL — ABNORMAL LOW (ref 8–23)
CO2: 27 mmol/L (ref 22–32)
Calcium: 9.1 mg/dL (ref 8.9–10.3)
Chloride: 113 mmol/L — ABNORMAL HIGH (ref 98–111)
Creatinine, Ser: 1.2 mg/dL — ABNORMAL HIGH (ref 0.44–1.00)
GFR, Estimated: 46 mL/min — ABNORMAL LOW (ref >60)
Glucose, Bld: 90 mg/dL (ref 70–99)
Potassium: 4.2 mmol/L (ref 3.5–5.1)
Sodium: 143 mmol/L (ref 135–145)

## 2023-11-20 NOTE — Progress Notes (Signed)
  5 Days Post-Op   Chief Complaint/Subjective: No abdominal pain, not much appetite, small amount of drainage  Objective: Vital signs in last 24 hours: Temp:  [97.6 F (36.4 C)-98.5 F (36.9 C)] 97.6 F (36.4 C) (12/21 0801) Pulse Rate:  [50-65] 56 (12/21 0801) Resp:  [16-18] 17 (12/21 0801) BP: (120-152)/(50-72) 146/58 (12/21 0801) SpO2:  [97 %-100 %] 100 % (12/21 0854) Weight:  [80.6 kg] 80.6 kg (12/21 0500) Last BM Date : 11/18/23 Intake/Output from previous day: 12/20 0701 - 12/21 0700 In: 710 [P.O.:510; IV Piggyback:200] Out: -   PE: Gen: NAd Resp: nonlabored Card: RRR Abd: soft, NT, ND  Lab Results:  Recent Labs    11/19/23 0643 11/20/23 0544  WBC 21.0* 18.4*  HGB 10.2* 9.6*  HCT 31.8* 30.0*  PLT 441* 423*   Recent Labs    11/19/23 0643 11/20/23 0544  NA 140 143  K 4.3 4.2  CL 112* 113*  CO2 22 27  GLUCOSE 94 90  BUN 6* 6*  CREATININE 1.00 1.20*  CALCIUM 9.1 9.1   No results for input(s): "LABPROT", "INR" in the last 72 hours.    Component Value Date/Time   NA 143 11/20/2023 0544   K 4.2 11/20/2023 0544   CL 113 (H) 11/20/2023 0544   CO2 27 11/20/2023 0544   GLUCOSE 90 11/20/2023 0544   BUN 6 (L) 11/20/2023 0544   CREATININE 1.20 (H) 11/20/2023 0544   CALCIUM 9.1 11/20/2023 0544   PROT 5.0 (L) 11/14/2023 0539   ALBUMIN 2.1 (L) 11/14/2023 0539   AST 18 11/14/2023 0539   ALT 15 11/14/2023 0539   ALKPHOS 81 11/14/2023 0539   BILITOT 0.9 11/14/2023 0539   GFRNONAA 46 (L) 11/20/2023 0544   GFRAA 56 (L) 11/19/2012 1708    Assessment/Plan  Colouterine fistula - culture from hysteroscopy with e coli. continue antibiotics - ID consulted and planning 4 weeks abx -WBC 18 from 21 -continued discussion with family, wants to wait til Monday and see where appetite and labs in hopes to avoid diversion - we are discussing plan with colorectal surgery and gynecology/oncology   FEN - carb mod diet VTE - lovenox ID - ceftriaxone/flagyl, E coli with  good sensitivities Disposition - WOC consult to continue ostomy conversation, inpatient   LOS: 8 days   I reviewed last 24 h vitals and pain scores, last 48 h intake and output, last 24 h labs and trends, and last 24 h imaging results.  This care required high  level of medical decision making.   De Blanch [facility] 11/20/2023, 9:08 AM Please see Amion for pager number during day hours 7:00am-4:30pm or 7:00am -11:30am on weekends

## 2023-11-20 NOTE — Progress Notes (Signed)
   11/20/23 1418  Mobility  Activity Ambulated with assistance in room;Transferred to/from [facility]  Level of Assistance Contact guard assist, steadying assist  Assistive Device Front wheel walker  Distance Ambulated (ft) 10 ft  Activity Response Tolerated fair  Mobility Referral Yes  Mobility visit 1 Mobility  Mobility Specialist Start Time (ACUTE ONLY) 1358  Mobility Specialist Stop Time (ACUTE ONLY) 1418  Mobility Specialist Time Calculation (min) (ACUTE ONLY) 20 min   Mobility Specialist: Progress Note Pt agreeable to mobility session - received in bed. Required CG using RW. Pt was asymptomatic throughout session with no complaints. Further mobility deferred by pt. Returned to chair with all needs met - call bell within reach.   Barnie Mort, BS Mobility Specialist Please contact via SecureChat or Rehab office at [phone].

## 2023-11-20 NOTE — Plan of Care (Signed)

## 2023-11-20 NOTE — Progress Notes (Signed)
Progress Note    Heather Grant   XBM:[id number]  DOB: [birth year]  DOA: 11/12/2023     8 PCP: Myrlene Broker, MD  Initial CC: abd pain  Hospital Course: Heather Grant is a 82 y.o. female with medical history significant for essential pretension, hyperlipidemia, moderate persistent asthma, who is admitted to [facility] on 11/12/2023 by way of transfer from [facility] with endometritis and concern for a colo-uterine fistula.  She was started on antibiotics on admission and underwent consultation with OB/GYN.  She underwent cervical dilation on 11/15/2023.  Interval History:  No events overnight.  Daughter is present this morning and update given with questions answered. Ongoing conversations still being held amongst surgical specialties. Family still preferring some sort of surgical solution while inpatient given their concern about readmission before outpt followups.   Assessment and Plan:  Endometritis -Patient underwent CT abdomen/pelvis, pelvic ultrasound, MRI pelvis on admission - Results consistent with endometritis with posterior uterine body wall densely adherent to the distal sigmoid colon with suspected 1 cm in length Colo uterine fistula - s/p cervical dilation on 11/15/2023 - continue Rocephin and flagyl while inpatient  - OB/GYN followed; has recommended outpatient follow up with GYN-onc to discuss surgical management but this might now be taking place inpatient; ongoing conversations in process  - pending discussions with surgical subspecialties  - Ucx with E. Coli  - uterine fluid culture: E. coli, Streptococcus constellatus, Bacteroides fragilis -Evaluated by ID.  Tentative Augmentin at discharge but if remains inpatient will keep on IV abx for now until more definitive plan with surgery   Acute cystitis - continue rocephin  -Etiology from [facility] uterine fistula, see above discussion -Urine growing pansensitive E. coli, see above  PAF with  RVR - resolved  - new onset afib on 12/18; suspect precipitated in setting of infection - hold off on [facility] at this time -Home Lopressor already been resumed but then developed bradycardia - converted back to NSR on 12/19; confirmed with EKG 12/20 - hold further lopressor for now  Hypokalemia Replaced, repeat level wnl.    Normocytic anemia Continue to follow   Essential hypertension Blood pressure parameters are well controlled.    Hyperlipidemia Patient on Crestor, continue the same   Asthma Continue with Dulera twice daily   GERD Stable     Estimated body mass index is 33.66 kg/m as calculated from the following:   Height as of this encounter: 4\' 11"  (1.499 m).   Weight as of this encounter: 75.6 kg.    Old records reviewed in assessment of this patient  Antimicrobials: Cefepime 11/14/2023 >> 12/19 Rocephin 12/19 >> current  Flagyl 11/12/2023 >> current  DVT prophylaxis:  SCDs Start: 11/12/23 2110   Code Status:   Code Status: Full Code  Mobility Assessment (Last 72 Hours)     Mobility Assessment     Row Name 11/20/23 0944 11/19/23 2100 11/19/23 1850 11/19/23 1029 11/19/23 0708   Does patient have an order for bedrest or is patient medically unstable No - Continue assessment No - Continue assessment -- -- No - Continue assessment   What is the highest level of mobility based on the progressive mobility assessment? Level 4 (Walks with assist in room) - Balance while marching in place and cannot step forward and back - Complete Level 4 (Walks with assist in room) - Balance while marching in place and cannot step forward and back - Complete Level 5 (Walks with assist in room/hall) - Balance while  stepping forward/back and can walk in room with assist - Complete Level 5 (Walks with assist in room/hall) - Balance while stepping forward/back and can walk in room with assist - Complete Level 4 (Walks with assist in room) - Balance while marching in place and cannot step  forward and back - Complete   Is the above level different from baseline mobility prior to current illness? Yes - Recommend PT order Yes - Recommend PT order -- -- Yes - Recommend PT order    Row Name 11/18/23 1945 11/18/23 1010 11/18/23 0920 11/17/23 1945 11/17/23 1600   Does patient have an order for bedrest or is patient medically unstable No - Continue assessment -- No - Continue assessment No - Continue assessment --   What is the highest level of mobility based on the progressive mobility assessment? Level 5 (Walks with assist in room/hall) - Balance while stepping forward/back and can walk in room with assist - Complete Level 5 (Walks with assist in room/hall) - Balance while stepping forward/back and can walk in room with assist - Complete Level 5 (Walks with assist in room/hall) - Balance while stepping forward/back and can walk in room with assist - Complete Level 5 (Walks with assist in room/hall) - Balance while stepping forward/back and can walk in room with assist - Complete Level 5 (Walks with assist in room/hall) - Balance while stepping forward/back and can walk in room with assist - Complete   Is the above level different from baseline mobility prior to current illness? Yes - Recommend PT order -- Yes - Recommend PT order Yes - Recommend PT order --            Barriers to discharge: none Disposition Plan:  Home Status is: Inpt  Objective: Blood pressure (!) 143/66, pulse (!) 51, temperature 98.1 F (36.7 C), temperature source Oral, resp. rate 17, height 4\' 11"  (1.499 m), weight 80.6 kg, SpO2 100%.  Examination:  Physical Exam Constitutional:      Appearance: Normal appearance.  HENT:     Head: Normocephalic and atraumatic.     Mouth/Throat:     Mouth: Mucous membranes are moist.  Eyes:     Extraocular Movements: Extraocular movements intact.  Cardiovascular:     Rate and Rhythm: Normal rate and regular rhythm.  Pulmonary:     Effort: Pulmonary effort is normal. No  respiratory distress.     Breath sounds: Normal breath sounds. No wheezing.  Abdominal:     General: Bowel sounds are normal. There is no distension.     Palpations: Abdomen is soft.     Tenderness: There is no abdominal tenderness.  Musculoskeletal:        General: Normal range of motion.     Cervical back: Normal range of motion and neck supple.  Skin:    General: Skin is warm and dry.  Neurological:     General: No focal deficit present.     Mental Status: She is alert.  Psychiatric:        Mood and Affect: Mood normal.      Consultants:  Ob/Gyn General surgery  Procedures:  11/15/2023: Cervical dilation  Data Reviewed: Results for orders placed or performed during the hospital encounter of 11/12/23 (from the past 24 hours)  Surgical PCR screen     Status: None   Collection Time: 11/20/23  2:32 AM   Specimen: Nasal Mucosa; Nasal Swab  Result Value Ref Range   MRSA, PCR NEGATIVE NEGATIVE   Staphylococcus  aureus NEGATIVE NEGATIVE  Basic metabolic panel     Status: Abnormal   Collection Time: 11/20/23  5:44 AM  Result Value Ref Range   Sodium 143 135 - 145 mmol/L   Potassium 4.2 3.5 - 5.1 mmol/L   Chloride 113 (H) 98 - 111 mmol/L   CO2 27 22 - 32 mmol/L   Glucose, Bld 90 70 - 99 mg/dL   BUN 6 (L) 8 - 23 mg/dL   Creatinine, Ser 1.61 (H) 0.44 - 1.00 mg/dL   Calcium 9.1 8.9 - 09.6 mg/dL   GFR, Estimated 46 (L) >60 mL/min   Anion gap 3 (L) 5 - 15  CBC with Differential/Platelet     Status: Abnormal   Collection Time: 11/20/23  5:44 AM  Result Value Ref Range   WBC 18.4 (H) 4.0 - 10.5 K/uL   RBC 3.11 (L) 3.87 - 5.11 MIL/uL   Hemoglobin 9.6 (L) 12.0 - 15.0 g/dL   HCT 04.5 (L) 40.9 - 81.1 %   MCV 96.5 80.0 - 100.0 fL   MCH 30.9 26.0 - 34.0 pg   MCHC 32.0 30.0 - 36.0 g/dL   RDW 91.4 78.2 - 95.6 %   Platelets 423 (H) 150 - 400 K/uL   nRBC 0.0 0.0 - 0.2 %   Neutrophils Relative % 69 %   Neutro Abs 12.7 (H) 1.7 - 7.7 K/uL   Lymphocytes Relative 15 %   Lymphs Abs  2.8 0.7 - 4.0 K/uL   Monocytes Relative 10 %   Monocytes Absolute 1.8 (H) 0.1 - 1.0 K/uL   Eosinophils Relative 4 %   Eosinophils Absolute 0.7 (H) 0.0 - 0.5 K/uL   Basophils Relative 2 %   Basophils Absolute 0.4 (H) 0.0 - 0.1 K/uL   nRBC 0 0 /100 WBC   Abs Immature Granulocytes 0.00 0.00 - 0.07 K/uL   Polychromasia PRESENT   Magnesium     Status: None   Collection Time: 11/20/23  5:44 AM  Result Value Ref Range   Magnesium 1.8 1.7 - 2.4 mg/dL    I have reviewed pertinent nursing notes, vitals, labs, and images as necessary. I have ordered labwork to follow up on as indicated.  I have reviewed the last notes from staff over past 24 hours. I have discussed patient's care plan and test results with nursing staff, CM/SW, and other staff as appropriate.    LOS: 8 days   Lewie Chamber, MD [facility] 11/20/2023, 2:54 PM

## 2023-11-21 DIAGNOSIS — N3000 Acute cystitis without hematuria: Secondary | ICD-10-CM | POA: Diagnosis not present

## 2023-11-21 DIAGNOSIS — N719 Inflammatory disease of uterus, unspecified: Secondary | ICD-10-CM | POA: Diagnosis not present

## 2023-11-21 DIAGNOSIS — I480 Paroxysmal atrial fibrillation: Secondary | ICD-10-CM | POA: Diagnosis not present

## 2023-11-21 LAB — CBC WITH DIFFERENTIAL/PLATELET
Abs Immature Granulocytes: 0 10*3/uL (ref 0.00–0.07)
Basophils Absolute: 0.5 10*3/uL — ABNORMAL HIGH (ref 0.0–0.1)
Basophils Relative: 3 %
Eosinophils Absolute: 0.3 10*3/uL (ref 0.0–0.5)
Eosinophils Relative: 2 %
HCT: 30.5 % — ABNORMAL LOW (ref 36.0–46.0)
Hemoglobin: 9.6 g/dL — ABNORMAL LOW (ref 12.0–15.0)
Lymphocytes Relative: 29 %
Lymphs Abs: 4.8 10*3/uL — ABNORMAL HIGH (ref 0.7–4.0)
MCH: 30.8 pg (ref 26.0–34.0)
MCHC: 31.5 g/dL (ref 30.0–36.0)
MCV: 97.8 fL (ref 80.0–100.0)
Monocytes Absolute: 1.8 10*3/uL — ABNORMAL HIGH (ref 0.1–1.0)
Monocytes Relative: 11 %
Neutro Abs: 9.1 10*3/uL — ABNORMAL HIGH (ref 1.7–7.7)
Neutrophils Relative %: 55 %
Platelets: 421 10*3/uL — ABNORMAL HIGH (ref 150–400)
RBC: 3.12 MIL/uL — ABNORMAL LOW (ref 3.87–5.11)
RDW: 14.1 % (ref 11.5–15.5)
WBC: 16.5 10*3/uL — ABNORMAL HIGH (ref 4.0–10.5)
nRBC: 0 % (ref 0.0–0.2)
nRBC: 0 /100{WBCs}

## 2023-11-21 LAB — BASIC METABOLIC PANEL
Anion gap: 8 (ref 5–15)
BUN: 7 mg/dL — ABNORMAL LOW (ref 8–23)
CO2: 23 mmol/L (ref 22–32)
Calcium: 9 mg/dL (ref 8.9–10.3)
Chloride: 110 mmol/L (ref 98–111)
Creatinine, Ser: 1.03 mg/dL — ABNORMAL HIGH (ref 0.44–1.00)
GFR, Estimated: 55 mL/min — ABNORMAL LOW (ref >60)
Glucose, Bld: 93 mg/dL (ref 70–99)
Potassium: 3.9 mmol/L (ref 3.5–5.1)
Sodium: 141 mmol/L (ref 135–145)

## 2023-11-21 LAB — MAGNESIUM: Magnesium: 1.7 mg/dL (ref 1.7–2.4)

## 2023-11-21 NOTE — Progress Notes (Signed)
   11/21/23 1342  Mobility  Activity Ambulated with assistance in hallway  Level of Assistance Minimal assist, patient does 75% or more  Assistive Device Front wheel walker  Distance Ambulated (ft) 75 ft  Activity Response Tolerated fair  Mobility Referral Yes  Mobility visit 1 Mobility  Mobility Specialist Start Time (ACUTE ONLY) 1325  Mobility Specialist Stop Time (ACUTE ONLY) 1342  Mobility Specialist Time Calculation (min) (ACUTE ONLY) 17 min   Mobility Specialist: Progress Note  Pt agreeable to mobility session - received in bed. Required MinA using RW. C/o RLE stiffness and "something on her lip."  Returned to chair with all needs met - call bell within reach.   Barnie Mort, BS Mobility Specialist Please contact via SecureChat or Rehab office at [phone].

## 2023-11-21 NOTE — Progress Notes (Signed)
Changed dressing on patient IV in left hand per pt family request.

## 2023-11-21 NOTE — Plan of Care (Signed)

## 2023-11-21 NOTE — Progress Notes (Signed)
Progress Note    [name]   ZOX:[id number]  DOB: [birth year]  DOA: 11/12/2023     9 PCP: Myrlene Broker, MD  Initial CC: abd pain  Hospital Course: Heather Grant is a 82 y.o. female with medical history significant for essential pretension, hyperlipidemia, moderate persistent asthma, who is admitted to [facility] on 11/12/2023 by way of transfer from [facility] with endometritis and concern for a colo-uterine fistula.  She was started on antibiotics on admission and underwent consultation with OB/GYN.  She underwent cervical dilation on 11/15/2023.  Interval History:  No events overnight.  Patient resting in bed comfortably this morning.  Denies any abdominal pain.  Assessment and Plan:  Endometritis -Patient underwent CT abdomen/pelvis, pelvic ultrasound, MRI pelvis on admission - Results consistent with endometritis with posterior uterine body wall densely adherent to the distal sigmoid colon with suspected 1 cm in length Colo uterine fistula - s/p cervical dilation on 11/15/2023 - continue Rocephin and flagyl while inpatient  - OB/GYN followed; has recommended outpatient follow up with GYN-onc to discuss surgical management but this might now be taking place inpatient; ongoing conversations in process  - pending discussions with surgical subspecialties  - Ucx with E. Coli  - uterine fluid culture: E. coli, Streptococcus constellatus, Bacteroides fragilis -Evaluated by ID.  Tentative Augmentin at discharge but if remains inpatient will keep on IV abx for now until more definitive plan with surgery   Acute cystitis - continue rocephin  -Etiology from [facility] uterine fistula, see above discussion -Urine growing pansensitive E. coli, see above  PAF with RVR - resolved  - new onset afib on 12/18; suspect precipitated in setting of infection - hold off on [facility] at this time -Home Lopressor already been resumed but then developed bradycardia - converted  back to NSR on 12/19; confirmed with EKG 12/20 - hold further lopressor for now  Hypokalemia Replaced, repeat level wnl.    Normocytic anemia Continue to follow   Essential hypertension Blood pressure parameters are well controlled.    Hyperlipidemia Patient on Crestor, continue the same   Asthma Continue with Dulera twice daily   GERD Stable     Estimated body mass index is 33.66 kg/m as calculated from the following:   Height as of this encounter: 4\' 11"  (1.499 m).   Weight as of this encounter: 75.6 kg.    Old records reviewed in assessment of this patient  Antimicrobials: Cefepime 11/14/2023 >> 12/19 Rocephin 12/19 >> current  Flagyl 11/12/2023 >> current  DVT prophylaxis:  SCDs Start: 11/12/23 2110   Code Status:   Code Status: Full Code  Mobility Assessment (Last 72 Hours)     Mobility Assessment     Row Name 11/21/23 0830 11/20/23 2020 11/20/23 0944 11/19/23 2100 11/19/23 1850   Does patient have an order for bedrest or is patient medically unstable No - Continue assessment No - Continue assessment No - Continue assessment No - Continue assessment --   What is the highest level of mobility based on the progressive mobility assessment? Level 4 (Walks with assist in room) - Balance while marching in place and cannot step forward and back - Complete Level 4 (Walks with assist in room) - Balance while marching in place and cannot step forward and back - Complete Level 4 (Walks with assist in room) - Balance while marching in place and cannot step forward and back - Complete Level 4 (Walks with assist in room) - Balance while marching in  place and cannot step forward and back - Complete Level 5 (Walks with assist in room/hall) - Balance while stepping forward/back and can walk in room with assist - Complete   Is the above level different from baseline mobility prior to current illness? Yes - Recommend PT order Yes - Recommend PT order Yes - Recommend PT order Yes -  Recommend PT order --    Row Name 11/19/23 1029 11/19/23 0708 11/18/23 1945       Does patient have an order for bedrest or is patient medically unstable -- No - Continue assessment No - Continue assessment     What is the highest level of mobility based on the progressive mobility assessment? Level 5 (Walks with assist in room/hall) - Balance while stepping forward/back and can walk in room with assist - Complete Level 4 (Walks with assist in room) - Balance while marching in place and cannot step forward and back - Complete Level 5 (Walks with assist in room/hall) - Balance while stepping forward/back and can walk in room with assist - Complete     Is the above level different from baseline mobility prior to current illness? -- Yes - Recommend PT order Yes - Recommend PT order              Barriers to discharge: none Disposition Plan:  Home Status is: Inpt  Objective: Blood pressure (!) 154/72, pulse (!) 50, temperature 98.9 F (37.2 C), temperature source Oral, resp. rate 18, height 4\' 11"  (1.499 m), weight 80.4 kg, SpO2 98%.  Examination:  Physical Exam Constitutional:      Appearance: Normal appearance.  HENT:     Head: Normocephalic and atraumatic.     Mouth/Throat:     Mouth: Mucous membranes are moist.  Eyes:     Extraocular Movements: Extraocular movements intact.  Cardiovascular:     Rate and Rhythm: Normal rate and regular rhythm.  Pulmonary:     Effort: Pulmonary effort is normal. No respiratory distress.     Breath sounds: Normal breath sounds. No wheezing.  Abdominal:     General: Bowel sounds are normal. There is no distension.     Palpations: Abdomen is soft.     Tenderness: There is no abdominal tenderness.  Musculoskeletal:        General: Normal range of motion.     Cervical back: Normal range of motion and neck supple.  Skin:    General: Skin is warm and dry.  Neurological:     General: No focal deficit present.     Mental Status: She is alert.   Psychiatric:        Mood and Affect: Mood normal.      Consultants:  Ob/Gyn General surgery  Procedures:  11/15/2023: Cervical dilation  Data Reviewed: Results for orders placed or performed during the hospital encounter of 11/12/23 (from the past 24 hours)  Basic metabolic panel     Status: Abnormal   Collection Time: 11/21/23  6:22 AM  Result Value Ref Range   Sodium 141 135 - 145 mmol/L   Potassium 3.9 3.5 - 5.1 mmol/L   Chloride 110 98 - 111 mmol/L   CO2 23 22 - 32 mmol/L   Glucose, Bld 93 70 - 99 mg/dL   BUN 7 (L) 8 - 23 mg/dL   Creatinine, Ser 2.70 (H) 0.44 - 1.00 mg/dL   Calcium 9.0 8.9 - 35.0 mg/dL   GFR, Estimated 55 (L) >60 mL/min   Anion gap 8 5 -  15  Magnesium     Status: None   Collection Time: 11/21/23  6:22 AM  Result Value Ref Range   Magnesium 1.7 1.7 - 2.4 mg/dL  CBC with Differential/Platelet     Status: Abnormal   Collection Time: 11/21/23  6:22 AM  Result Value Ref Range   WBC 16.5 (H) 4.0 - 10.5 K/uL   RBC 3.12 (L) 3.87 - 5.11 MIL/uL   Hemoglobin 9.6 (L) 12.0 - 15.0 g/dL   HCT 14.7 (L) 82.9 - 56.2 %   MCV 97.8 80.0 - 100.0 fL   MCH 30.8 26.0 - 34.0 pg   MCHC 31.5 30.0 - 36.0 g/dL   RDW 13.0 86.5 - 78.4 %   Platelets 421 (H) 150 - 400 K/uL   nRBC 0.0 0.0 - 0.2 %   Neutrophils Relative % 55 %   Neutro Abs 9.1 (H) 1.7 - 7.7 K/uL   Lymphocytes Relative 29 %   Lymphs Abs 4.8 (H) 0.7 - 4.0 K/uL   Monocytes Relative 11 %   Monocytes Absolute 1.8 (H) 0.1 - 1.0 K/uL   Eosinophils Relative 2 %   Eosinophils Absolute 0.3 0.0 - 0.5 K/uL   Basophils Relative 3 %   Basophils Absolute 0.5 (H) 0.0 - 0.1 K/uL   nRBC 0 0 /100 WBC   Abs Immature Granulocytes 0.00 0.00 - 0.07 K/uL   Polychromasia PRESENT     I have reviewed pertinent nursing notes, vitals, labs, and images as necessary. I have ordered labwork to follow up on as indicated.  I have reviewed the last notes from staff over past 24 hours. I have discussed patient's care plan and test  results with nursing staff, CM/SW, and other staff as appropriate.    LOS: 9 days   Lewie Chamber, MD [facility] 11/21/2023, 12:34 PM

## 2023-11-22 DIAGNOSIS — N3000 Acute cystitis without hematuria: Secondary | ICD-10-CM | POA: Diagnosis not present

## 2023-11-22 DIAGNOSIS — N719 Inflammatory disease of uterus, unspecified: Secondary | ICD-10-CM | POA: Diagnosis not present

## 2023-11-22 LAB — CBC WITH DIFFERENTIAL/PLATELET
Abs Immature Granulocytes: 0 10*3/uL (ref 0.00–0.07)
Basophils Absolute: 0.1 10*3/uL (ref 0.0–0.1)
Basophils Relative: 1 %
Eosinophils Absolute: 0.4 10*3/uL (ref 0.0–0.5)
Eosinophils Relative: 3 %
HCT: 33 % — ABNORMAL LOW (ref 36.0–46.0)
Hemoglobin: 10.2 g/dL — ABNORMAL LOW (ref 12.0–15.0)
Lymphocytes Relative: 38 %
Lymphs Abs: 5.3 10*3/uL — ABNORMAL HIGH (ref 0.7–4.0)
MCH: 30.6 pg (ref 26.0–34.0)
MCHC: 30.9 g/dL (ref 30.0–36.0)
MCV: 99.1 fL (ref 80.0–100.0)
Monocytes Absolute: 0.6 10*3/uL (ref 0.1–1.0)
Monocytes Relative: 4 %
Neutro Abs: 7.5 10*3/uL (ref 1.7–7.7)
Neutrophils Relative %: 54 %
Platelets: 445 10*3/uL — ABNORMAL HIGH (ref 150–400)
RBC: 3.33 MIL/uL — ABNORMAL LOW (ref 3.87–5.11)
RDW: 14.6 % (ref 11.5–15.5)
WBC: 13.9 10*3/uL — ABNORMAL HIGH (ref 4.0–10.5)
nRBC: 0 % (ref 0.0–0.2)
nRBC: 0 /100{WBCs}

## 2023-11-22 NOTE — Care Management Important Message (Signed)
Important Message  Patient Details  Name: Heather Grant MRN: [id number] Date of Birth: [birth year]   Important Message Given:  Yes - Medicare IM     Sherilyn Banker 11/22/2023, 3:20 PM

## 2023-11-22 NOTE — Progress Notes (Signed)
   11/22/23 1040  Mobility  Activity Transferred to/from [facility]  Level of Assistance Standby assist, set-up cues, supervision of patient - no hands on  Assistive Device Front wheel walker  Distance Ambulated (ft) 8 ft  Range of Motion/Exercises Active  Activity Response Tolerated well;Tolerated fair  Mobility Referral Yes  Mobility visit 1 Mobility  Mobility Specialist Start Time (ACUTE ONLY) 1005  Mobility Specialist Stop Time (ACUTE ONLY) 1026  Mobility Specialist Time Calculation (min) (ACUTE ONLY) 21 min   Mobility Specialist: Progress Note  Post Mobility: 130/63 (76), HR 53  Pt agreeable to mobility session - received in bed. Required SB using RW. C/o dizziness and wanting a "wash up." Returned to [facility] with all needs met - call bell within reach. NT present.  Barnie Mort, BS Mobility Specialist Please contact via SecureChat or Rehab office at [phone].

## 2023-11-22 NOTE — Progress Notes (Signed)
    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 continue antibiotics  - ID consulted and planning 4 weeks abx - WBC continues to improve, 13.9 this AM  - Dr. Angelena Form to evaluate tomorrow, 12/24 (colorectal surgery)   FEN - carb mod diet VTE - lovenox ID - ceftriaxone/flagyl, E coli with good sensitivities Disposition - WOC consult to continue ostomy conversation, inpatient        [name], MD [facility] practice Office: [phone]        Chief Complaint: Colouterine fistula  Subjective: Patient in bed, comfortable.  Denies pain.  Minimal drainage from vagina, rectum per patient.  Objective: Vital signs in last 24 hours: Temp:  [97.4 F (36.3 C)-98.5 F (36.9 C)] 98.2 F (36.8 C) (12/23 0754) Pulse Rate:  [54-65] 56 (12/23 0754) Resp:  [17-18] 17 (12/23 0754) BP: (141-152)/(55-67) 152/56 (12/23 0754) SpO2:  [96 %-99 %] 97 % (12/23 0807) Weight:  [80.5 kg] 80.5 kg (12/23 0500) Last BM Date : 11/20/23  Intake/Output from previous day: 12/22 0701 - 12/23 0700 In: 580 [P.O.:480; IV Piggyback:100] Out: -  Intake/Output this shift: No intake/output data recorded.  Physical Exam: HEENT - sclerae clear, mucous membranes moist Neck - soft Abdomen - soft, non-tender without distension  Lab Results:  Recent Labs    11/21/23 0622 11/22/23 0415  WBC 16.5* 13.9*  HGB 9.6* 10.2*  HCT 30.5* 33.0*  PLT 421* 445*   BMET Recent Labs    11/20/23 0544 11/21/23 0622  NA 143 141  K 4.2 3.9  CL 113* 110  CO2 27 23  GLUCOSE 90 93  BUN 6* 7*  CREATININE 1.20* 1.03*  CALCIUM 9.1 9.0   PT/INR No results for input(s): "LABPROT", "INR" in the last 72 hours. Comprehensive Metabolic Panel:    Component Value Date/Time   NA 141 11/21/2023 0622   NA 143 11/20/2023 0544   K 3.9 11/21/2023 0622   K 4.2 11/20/2023 0544   CL 110 11/21/2023 0622   CL 113 (H) 11/20/2023 0544   CO2 23 11/21/2023 0622    CO2 27 11/20/2023 0544   BUN 7 (L) 11/21/2023 0622   BUN 6 (L) 11/20/2023 0544   CREATININE 1.03 (H) 11/21/2023 0622   CREATININE 1.20 (H) 11/20/2023 0544   GLUCOSE 93 11/21/2023 0622   GLUCOSE 90 11/20/2023 0544   CALCIUM 9.0 11/21/2023 0622   CALCIUM 9.1 11/20/2023 0544   AST 18 11/14/2023 0539   AST 18 11/13/2023 0407   ALT 15 11/14/2023 0539   ALT 14 11/13/2023 0407   ALKPHOS 81 11/14/2023 0539   ALKPHOS 68 11/13/2023 0407   BILITOT 0.9 11/14/2023 0539   BILITOT 1.2 (H) 11/13/2023 0407   PROT 5.0 (L) 11/14/2023 0539   PROT 5.2 (L) 11/13/2023 0407   ALBUMIN 2.1 (L) 11/14/2023 0539   ALBUMIN 2.3 (L) 11/13/2023 0407    Studies/Results: No results found.    [name] 11/22/2023  Patient ID: Heather Grant, female   DOB: 1941-06-17, 82 y.o.   MRN: [id number]

## 2023-11-22 NOTE — Plan of Care (Signed)

## 2023-11-22 NOTE — Progress Notes (Signed)
Physical Therapy Treatment Patient Details Name: Heather Grant MRN: [id number] DOB: 1941/02/23 Today's Date: 11/22/2023   History of Present Illness Pt is a 82 y.o. F who presents 11/12/2023 with lower abdominal pain and possible endometriosis as evident on CT of abdomen and pelvis. MRI pelvis showing moderate sigmoid diverticulosis. Significant PMH: HLD, moderate persistent asthma.    PT Comments  Pt progressing towards her physical therapy goals this session; reports has been able to eat half a sandwich today. Performed seated warm up exercises and ambulating 75 ft with a walker. Continues to require min assist for functional mobility; would ideally be modI to return home alone. Patient will benefit from continued inpatient follow up therapy, <3 hours/day.    If plan is discharge home, recommend the following: A little help with walking and/or transfers;A little help with bathing/dressing/bathroom;Assistance with cooking/housework;Assist for transportation;Help with stairs or ramp for entrance   Can travel by private vehicle     Yes  Equipment Recommendations  None recommended by PT    Recommendations for Other Services       Precautions / Restrictions Precautions Precautions: Fall Restrictions Weight Bearing Restrictions Per Provider Order: No     Mobility  Bed Mobility               General bed mobility comments: OOB in chair    Transfers Overall transfer level: Needs assistance Equipment used: Rolling walker (2 wheels) Transfers: Sit to/from Stand Sit to Stand: Min assist           General transfer comment: MinA to rise from chair    Ambulation/Gait Ambulation/Gait assistance: Min assist Gait Distance (Feet): 75 Feet Assistive device: Rolling walker (2 wheels) Gait Pattern/deviations: Step-through pattern, Decreased stride length Gait velocity: decreased Gait velocity interpretation: <1.31 ft/sec, indicative of household ambulator   General  Gait Details: Moderate reliance through arms on Rollator, slow and effortful pace   Stairs             Wheelchair Mobility     Tilt Bed    Modified Rankin (Stroke Patients Only)       Balance Overall balance assessment: Needs assistance Sitting-balance support: Feet supported Sitting balance-Leahy Scale: Good     Standing balance support: Bilateral upper extremity supported Standing balance-Leahy Scale: Poor Standing balance comment: reliant on RW                            Cognition Arousal: Alert Behavior During Therapy: WFL for tasks assessed/performed Overall Cognitive Status: Within Functional Limits for tasks assessed                                          Exercises General Exercises - Lower Extremity Heel Slides: Both, 5 reps, Seated Hip Flexion/Marching: Both, 5 reps, Seated    General Comments        Pertinent Vitals/Pain Pain Assessment Pain Assessment: No/denies pain    Home Living                          Prior Function            PT Goals (current goals can now be found in the care plan section) Acute Rehab PT Goals Patient Stated Goal: return to independence Potential to Achieve Goals: Good Progress towards PT goals: Progressing toward  goals    Frequency    Min 1X/week      PT Plan      Co-evaluation              AM-PAC PT "6 Clicks" Mobility   Outcome Measure  Help needed turning from your back to your side while in a flat bed without using bedrails?: None Help needed moving from lying on your back to sitting on the side of a flat bed without using bedrails?: A Little Help needed moving to and from a bed to a chair (including a wheelchair)?: A Little Help needed standing up from a chair using your arms (e.g., wheelchair or bedside chair)?: A Little Help needed to walk in hospital room?: A Little Help needed climbing 3-5 steps with a railing? : A Lot 6 Click Score: 18     End of Session Equipment Utilized During Treatment: Gait belt Activity Tolerance: Patient tolerated treatment well Patient left: in chair;with call bell/phone within reach Nurse Communication: Mobility status PT Visit Diagnosis: Unsteadiness on feet (R26.81);Difficulty in walking, not elsewhere classified (R26.2)     Time: 3086-5784 PT Time Calculation (min) (ACUTE ONLY): 21 min  Charges:    $Therapeutic Activity: 8-22 mins PT General Charges $$ ACUTE PT VISIT: 1 Visit                     Lillia Pauls, PT, DPT Acute Rehabilitation Services Office [phone]    Norval Morton 11/22/2023, 4:33 PM

## 2023-11-22 NOTE — Progress Notes (Signed)
Progress Note    [name]   OZH:[id number]  DOB: July 08, 1941  DOA: 11/12/2023     10 PCP: Myrlene Broker, MD  Initial CC: abd pain  Hospital Course: Heather Grant is a 82 y.o. female with medical history significant for essential pretension, hyperlipidemia, moderate persistent asthma, who is admitted to [facility] on 11/12/2023 by way of transfer from [facility] with endometritis and concern for a colo-uterine fistula.  She was started on antibiotics on admission and underwent consultation with OB/GYN.  She underwent cervical dilation on 11/15/2023.  Interval History:  No events overnight.  Patient resting in bed comfortably this morning.  Denies any abdominal pain. Seems to be voiding comfortably at this time, but still on abx.  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 Coli  - uterine fluid culture: E. coli, Streptococcus constellatus, Bacteroides fragilis -Evaluated by ID.  Tentative Augmentin at discharge but if remains inpatient will keep on Rocephin/Flagyl for now until more definitive plan with surgery  Acute cystitis - continue rocephin  -Eti[facility] from [facility] uterine fistula, see above discussion -Urine growing pansensitive E. coli, see above  PAF with RVR - resolved  - new onset afib on 12/18; suspect precipitated in setting of infection - hold off on [facility] at this  time -Home Lopressor already been resumed but then developed bradycardia - converted back to NSR on 12/19; confirmed with EKG 12/20 - hold further lopressor for now  Hypokalemia Replaced, repeat level wnl.    Normocytic anemia Continue to follow   Essential hypertension Blood pressure parameters are well controlled.    Hyperlipidemia Patient on Crestor, continue the same   Asthma Continue with Dulera twice daily   GERD Stable     Estimated body mass index is 33.66 kg/m as calculated from the following:   Height as of this encounter: 4\' 11"  (1.499 m).   Weight as of this encounter: 75.6 kg.    Old records reviewed in assessment of this patient  Antimicrobials: Cefepime 11/14/2023 >> 12/19 Rocephin 12/19 >> current  Flagyl 11/12/2023 >> current  DVT prophylaxis:  SCDs Start: 11/12/23 2110   Code Status:   Code Status: Full Code  Mobility Assessment (Last 72 Hours)     Mobility Assessment     Row Name 11/22/23 0810 11/21/23 1934 11/21/23 0830 11/20/23 2020 11/20/23 0944   Does patient have an order for bedrest or is patient medically unstable No - Continue assessment No - Continue assessment No - Continue assessment No - Continue assessment No - Continue assessment   What is the highest level of mobility based on the progressive mobility assessment? Level 4 (Walks with assist in room) - Balance while marching in place and cannot step forward and back - Complete Level 4 (Walks with assist in room) - Balance while marching in place and cannot step forward and back - Complete Level 4 (Walks with assist in room) - Balance while marching in place and cannot step forward and back -  Complete Level 4 (Walks with assist in room) - Balance while marching in place and cannot step forward and back - Complete Level 4 (Walks with assist in room) - Balance while marching in place and cannot step forward and back - Complete   Is the above level different from baseline mobility prior to  current illness? Yes - Recommend PT order Yes - Recommend PT order Yes - Recommend PT order Yes - Recommend PT order Yes - Recommend PT order    Row Name 11/19/23 2100 11/19/23 1850         Does patient have an order for bedrest or is patient medically unstable No - Continue assessment --      What is the highest level of mobility based on the progressive mobility assessment? Level 4 (Walks with assist in room) - Balance while marching in place and cannot step forward and back - Complete Level 5 (Walks with assist in room/hall) - Balance while stepping forward/back and can walk in room with assist - Complete      Is the above level different from baseline mobility prior to current illness? Yes - Recommend PT order --               Barriers to discharge: none Disposition Plan:  Home Status is: Inpt  Objective: Blood pressure (!) 152/56, pulse (!) 56, temperature 98.2 F (36.8 C), temperature source Oral, resp. rate 17, height 4\' 11"  (1.499 m), weight 80.5 kg, SpO2 97%.  Examination:  Physical Exam Constitutional:      Appearance: Normal appearance.  HENT:     Head: Normocephalic and atraumatic.     Mouth/Throat:     Mouth: Mucous membranes are moist.  Eyes:     Extraocular Movements: Extraocular movements intact.  Cardiovascular:     Rate and Rhythm: Normal rate and regular rhythm.  Pulmonary:     Effort: Pulmonary effort is normal. No respiratory distress.     Breath sounds: Normal breath sounds. No wheezing.  Abdominal:     General: Bowel sounds are normal. There is no distension.     Palpations: Abdomen is soft.     Tenderness: There is no abdominal tenderness.  Musculoskeletal:        General: Normal range of motion.     Cervical back: Normal range of motion and neck supple.  Skin:    General: Skin is warm and dry.  Neurological:     General: No focal deficit present.     Mental Status: She is alert.  Psychiatric:        Mood and Affect: Mood normal.       Consultants:  Ob/Gyn General surgery  Procedures:  11/15/2023: Cervical dilation  Data Reviewed: Results for orders placed or performed during the hospital encounter of 11/12/23 (from the past 24 hours)  CBC with Differential/Platelet     Status: Abnormal   Collection Time: 11/22/23  4:15 AM  Result Value Ref Range   WBC 13.9 (H) 4.0 - 10.5 K/uL   RBC 3.33 (L) 3.87 - 5.11 MIL/uL   Hemoglobin 10.2 (L) 12.0 - 15.0 g/dL   HCT 96.0 (L) 45.4 - 09.8 %   MCV 99.1 80.0 - 100.0 fL   MCH 30.6 26.0 - 34.0 pg   MCHC 30.9 30.0 - 36.0 g/dL   RDW 11.9 14.7 - 82.9 %   Platelets 445 (H) 150 - 400 K/uL   nRBC 0.0 0.0 - 0.2 %   Neutrophils Relative % 54 %  Neutro Abs 7.5 1.7 - 7.7 K/uL   Lymphocytes Relative 38 %   Lymphs Abs 5.3 (H) 0.7 - 4.0 K/uL   Monocytes Relative 4 %   Monocytes Absolute 0.6 0.1 - 1.0 K/uL   Eosinophils Relative 3 %   Eosinophils Absolute 0.4 0.0 - 0.5 K/uL   Basophils Relative 1 %   Basophils Absolute 0.1 0.0 - 0.1 K/uL   WBC Morphology See Note    nRBC 0 0 /100 WBC   Abs Immature Granulocytes 0.00 0.00 - 0.07 K/uL   Polychromasia PRESENT     I have reviewed pertinent nursing notes, vitals, labs, and images as necessary. I have ordered labwork to follow up on as indicated.  I have reviewed the last notes from staff over past 24 hours. I have discussed patient's care plan and test results with nursing staff, CM/SW, and other staff as appropriate.    LOS: 10 days   Lewie Chamber, MD [facility] 11/22/2023, 3:35 PM

## 2023-11-22 NOTE — Progress Notes (Signed)
Pt family requesting doctor come to room and answer questions. Reached out to Girguis,MD to let him know. MD stated All the questions are for surgery. Dr. Cliffton Asters won't see pt until tomorrow. They are best to wait, unless medicine questions for him. No changed from last convo at this time. We're in a holding pattern until then. I let pt daughter know. She verbalized understanding.

## 2023-11-23 DIAGNOSIS — N3000 Acute cystitis without hematuria: Secondary | ICD-10-CM | POA: Diagnosis not present

## 2023-11-23 DIAGNOSIS — N719 Inflammatory disease of uterus, unspecified: Secondary | ICD-10-CM | POA: Diagnosis not present

## 2023-11-23 LAB — CBC WITH DIFFERENTIAL/PLATELET
Abs Immature Granulocytes: 1.09 10*3/uL — ABNORMAL HIGH (ref 0.00–0.07)
Basophils Absolute: 0.2 10*3/uL — ABNORMAL HIGH (ref 0.0–0.1)
Basophils Relative: 2 %
Eosinophils Absolute: 0.2 10*3/uL (ref 0.0–0.5)
Eosinophils Relative: 2 %
HCT: 34.3 % — ABNORMAL LOW (ref 36.0–46.0)
Hemoglobin: 10.7 g/dL — ABNORMAL LOW (ref 12.0–15.0)
Immature Granulocytes: 9 %
Lymphocytes Relative: 35 %
Lymphs Abs: 4.4 10*3/uL — ABNORMAL HIGH (ref 0.7–4.0)
MCH: 30.8 pg (ref 26.0–34.0)
MCHC: 31.2 g/dL (ref 30.0–36.0)
MCV: 98.8 fL (ref 80.0–100.0)
Monocytes Absolute: 1 10*3/uL (ref 0.1–1.0)
Monocytes Relative: 8 %
Neutro Abs: 5.6 10*3/uL (ref 1.7–7.7)
Neutrophils Relative %: 44 %
Platelets: 437 10*3/uL — ABNORMAL HIGH (ref 150–400)
RBC: 3.47 MIL/uL — ABNORMAL LOW (ref 3.87–5.11)
RDW: 14.9 % (ref 11.5–15.5)
Smear Review: NORMAL
WBC: 12.5 10*3/uL — ABNORMAL HIGH (ref 4.0–10.5)
nRBC: 0 % (ref 0.0–0.2)

## 2023-11-23 NOTE — Plan of Care (Signed)
  Problem: Clinical Measurements: Goal: Diagnostic test results will improve Outcome: Progressing Goal: Respiratory complications will improve Outcome: Progressing   Problem: Activity: Goal: Risk for activity intolerance will decrease Outcome: Progressing   Problem: Nutrition: Goal: Adequate nutrition will be maintained Outcome: Not Progressing   Problem: Elimination: Goal: Will not experience complications related to urinary retention Outcome: Progressing   Problem: Pain Management: Goal: General experience of comfort will improve Outcome: Progressing   Problem: Safety: Goal: Ability to remain free from injury will improve Outcome: Progressing   Problem: Skin Integrity: Goal: Risk for impaired skin integrity will decrease Outcome: Progressing

## 2023-11-23 NOTE — TOC Progression Note (Signed)
Transition of Care [facility]) - Progression Note    Patient Details  Name: Heather Grant MRN: [id number] Date of Birth: [birth year]  [facility] of [facility]) CM/SW Contact  Carley Hammed, LCSW Phone Number: 11/23/2023, 8:16 AM  Clinical Narrative:    TOC continues to follow for appropriate disposition. Noting pt still undergoing workup and procedures. CSW discussed SNF vs. HH with family and they are continuing to decide closer to being medically ready. TOC is available for any further needs.    Expected Discharge Plan: Home w Home Health Services Barriers to Discharge: Continued Medical Work up  Expected Discharge Plan and Services   Discharge Planning Services: CM Consult Post Acute Care Choice: Home Health Living arrangements for the past 2 months: Apartment                 DME Arranged: N/A DME Agency: NA       HH Arranged: RN, PT HH Agency: [facility] Date HH Agency Contacted: 11/15/23 Time HH Agency Contacted: 1420 Representative spoke with at [facility] Agency: Tresa Endo   Social Determinants of Health (SDOH) Interventions SDOH Screenings   Food Insecurity: No Food Insecurity (11/12/2023)  Housing: Low Risk  (11/12/2023)  Transportation Needs: Unmet Transportation Needs (11/12/2023)  Utilities: Not At Risk (11/12/2023)  Alcohol Screen: Low Risk  (01/18/2023)  Depression (PHQ2-9): Low Risk  (04/21/2023)  Financial Resource Strain: Low Risk  (01/18/2023)  Physical Activity: Inactive (01/18/2023)  Social Connections: Moderately Isolated (01/18/2023)  Stress: No Stress Concern Present (01/18/2023)  Tobacco Use: Low Risk  (11/15/2023)    Readmission Risk Interventions     No data to display

## 2023-11-23 NOTE — Progress Notes (Signed)
Progress Note    Heather Grant   ZOX:[id number]  DOB: [birth year]  DOA: 11/12/2023     11 PCP: Myrlene Broker, MD  Initial CC: abd pain  Hospital Course: Heather Grant is a 82 y.o. female with medical history significant for essential pretension, hyperlipidemia, moderate persistent asthma, who is admitted to [facility] on 11/12/2023 by way of transfer from [facility] with endometritis and concern for a colo-uterine fistula.  She was started on antibiotics on admission and underwent consultation with OB/GYN.  She underwent cervical dilation on 11/15/2023.  Interval History:  No events overnight. She remains comfortable. Family present bedside this morning and they were able to talk with Dr. Cliffton Asters as well. They had no further questions when seen this morning.   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 Coli  - uterine fluid culture: E. coli, Streptococcus constellatus, Bacteroides fragilis -Evaluated by ID.  Tentative Augmentin at discharge but if remains inpatient will keep on Rocephin/Flagyl for now until more definitive plan with surgery  Acute cystitis - continue rocephin  -Etiology from [facility] uterine fistula, see above discussion -Urine growing pansensitive E. coli, see above  PAF with RVR - resolved  - new onset afib on 12/18; suspect precipitated in setting of infection -  hold off on [facility] at this time -Home Lopressor already been resumed but then developed bradycardia - converted back to NSR on 12/19; confirmed with EKG 12/20 - hold further lopressor for now  Hypokalemia Replaced, repeat level wnl.    Normocytic anemia Continue to follow   Essential hypertension Blood pressure parameters are well controlled.    Hyperlipidemia Patient on Crestor, continue the same   Asthma Continue with Dulera twice daily   GERD Stable     Estimated body mass index is 33.66 kg/m as calculated from the following:   Height as of this encounter: 4\' 11"  (1.499 m).   Weight as of this encounter: 75.6 kg.    Old records reviewed in assessment of this patient  Antimicrobials: Cefepime 11/14/2023 >> 12/19 Rocephin 12/19 >> current  Flagyl 11/12/2023 >> current  DVT prophylaxis:  SCDs Start: 11/12/23 2110   Code Status:   Code Status: Full Code  Mobility Assessment (Last 72 Hours)     Mobility Assessment     Row Name 11/22/23 2100 11/22/23 1631 11/22/23 0810 11/21/23 1934 11/21/23 0830   Does patient have an order for bedrest or is patient medically unstable No - Continue assessment -- No - Continue assessment No - Continue assessment No - Continue assessment   What is the highest level of mobility based on the progressive mobility assessment? Level 5 (Walks with assist in room/hall) - Balance while stepping forward/back and can walk in room with assist - Complete Level 5 (Walks with assist in room/hall) - Balance while stepping forward/back and can walk in room with assist - Complete Level 4 (Walks with assist in room) - Balance while marching in place and cannot step forward and back -  Complete Level 4 (Walks with assist in room) - Balance while marching in place and cannot step forward and back - Complete Level 4 (Walks with assist in room) - Balance while marching in place and cannot step forward and back - Complete   Is the above level different from  baseline mobility prior to current illness? Yes - Recommend PT order -- Yes - Recommend PT order Yes - Recommend PT order Yes - Recommend PT order    Row Name 11/20/23 2020           Does patient have an order for bedrest or is patient medically unstable No - Continue assessment       What is the highest level of mobility based on the progressive mobility assessment? Level 4 (Walks with assist in room) - Balance while marching in place and cannot step forward and back - Complete       Is the above level different from baseline mobility prior to current illness? Yes - Recommend PT order                Barriers to discharge: none Disposition Plan:  Home Status is: Inpt  Objective: Blood pressure (!) 109/94, pulse 70, temperature 98.4 F (36.9 C), temperature source Oral, resp. rate 18, height 4\' 11"  (1.499 m), weight 80.5 kg, SpO2 99%.  Examination:  Physical Exam Constitutional:      Appearance: Normal appearance.  HENT:     Head: Normocephalic and atraumatic.     Mouth/Throat:     Mouth: Mucous membranes are moist.  Eyes:     Extraocular Movements: Extraocular movements intact.  Cardiovascular:     Rate and Rhythm: Normal rate and regular rhythm.  Pulmonary:     Effort: Pulmonary effort is normal. No respiratory distress.     Breath sounds: Normal breath sounds. No wheezing.  Abdominal:     General: Bowel sounds are normal. There is no distension.     Palpations: Abdomen is soft.     Tenderness: There is no abdominal tenderness.  Musculoskeletal:        General: Normal range of motion.     Cervical back: Normal range of motion and neck supple.  Skin:    General: Skin is warm and dry.  Neurological:     General: No focal deficit present.     Mental Status: She is alert.  Psychiatric:        Mood and Affect: Mood normal.      Consultants:  Ob/Gyn General surgery  Procedures:  11/15/2023: Cervical dilation  Data Reviewed: Results for orders placed or  performed during the hospital encounter of 11/12/23 (from the past 24 hours)  CBC with Differential/Platelet     Status: Abnormal   Collection Time: 11/23/23  6:23 AM  Result Value Ref Range   WBC 12.5 (H) 4.0 - 10.5 K/uL   RBC 3.47 (L) 3.87 - 5.11 MIL/uL   Hemoglobin 10.7 (L) 12.0 - 15.0 g/dL   HCT 30.8 (L) 65.7 - 84.6 %   MCV 98.8 80.0 - 100.0 fL   MCH 30.8 26.0 - 34.0 pg   MCHC 31.2 30.0 - 36.0 g/dL   RDW 96.2 95.2 - 84.1 %   Platelets 437 (H) 150 - 400 K/uL   nRBC 0.0 0.0 - 0.2 %   Neutrophils Relative % 44 %   Neutro Abs 5.6 1.7 - 7.7 K/uL   Lymphocytes Relative 35 %   Lymphs Abs 4.4 (H) 0.7 - 4.0 K/uL  Monocytes Relative 8 %   Monocytes Absolute 1.0 0.1 - 1.0 K/uL   Eosinophils Relative 2 %   Eosinophils Absolute 0.2 0.0 - 0.5 K/uL   Basophils Relative 2 %   Basophils Absolute 0.2 (H) 0.0 - 0.1 K/uL   WBC Morphology Mild Left Shift (1-5% metas, occ myelo)    RBC Morphology MORPHOLOGY UNREMARKABLE    Smear Review Normal platelet morphology    Immature Granulocytes 9 %   Abs Immature Granulocytes 1.09 (H) 0.00 - 0.07 K/uL    I have reviewed pertinent nursing notes, vitals, labs, and images as necessary. I have ordered labwork to follow up on as indicated.  I have reviewed the last notes from staff over past 24 hours. I have discussed patient's care plan and test results with nursing staff, CM/SW, and other staff as appropriate.    LOS: 11 days   Lewie Chamber, MD [facility] 11/23/2023, 2:02 PM

## 2023-11-23 NOTE — Progress Notes (Signed)
8 Days Post-Op Procedure(s): Cervical Dilation  Subjective: Patient reports tolerating PO.    Objective: BP (!) 166/78 (BP Location: Left Arm)   Pulse 62   Temp 97.8 F (36.6 C) (Oral)   Resp 18   Ht 4\' 11"  (1.499 m)   Wt 80.5 kg   SpO2 100%   BMI 35.84 kg/m   General: alert and cooperative GI: soft, non-tender; bowel sounds normal; no masses,  no organomegaly Extremities: Homans sign is negative, no sign of DVT  Assessment: s/p Procedure(s): Cervical Dilation: stable and pathology bening  Plan: Will be determined surgically by colorectal surgeon and GYN onc  LOS: 11 days    Jobe Mutch A Richel Millspaugh 11/23/2023, 3:27 PM

## 2023-11-23 NOTE — Progress Notes (Signed)
   11/23/23 0942  Mobility  Activity Ambulated with assistance in hallway  Level of Assistance Contact guard assist, steadying assist  Assistive Device Front wheel walker  Distance Ambulated (ft) 225 ft  Activity Response Tolerated fair  Mobility Referral Yes  Mobility visit 1 Mobility  Mobility Specialist Start Time (ACUTE ONLY) [id number]  Mobility Specialist Stop Time (ACUTE ONLY) 0942  Mobility Specialist Time Calculation (min) (ACUTE ONLY) 13 min   Mobility Specialist: Progress Note  Pt agreeable to mobility session - received in bed. Required CG using RW. C/o L knee pain towards EOS. Returned to chair with all needs met - call bell within reach. Chair alarm on. Daughters and son present.   Barnie Mort, BS Mobility Specialist Please contact via SecureChat or Rehab office at [phone].

## 2023-11-23 NOTE — Progress Notes (Signed)
    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 continue antibiotics  - ID consulted and planning 4 weeks abx - WBC continues to improve, 12.5 this AM  -    FEN - carb mod diet  VTE - lovenox ID - ceftriaxone/flagyl, E coli with good sensitivities Disposition - we will follow with you; if continues to improve over coming days doesn't look like she would require surgery this admission. If not improving over coming days, would be worth conversation.  I spent a total of 55 minutes in both face-to-face and non-face-to-face activities, excluding procedures performed, for this visit on the date of this encounter.         Marin Olp, MD [facility] practice Office: [phone]        Chief Complaint: Colouterine fistula  Subjective: Patient in bed, comfortable.  Multiple family members in room. Denies pain. Reports that for the first few days after D&C she had copious amounts of foul smelling discharge per vagina; then steadily decreased and is now more thin/clear and lower volume. Denies any abdominal pain, n/v.  Objective: Vital signs in last 24 hours: Temp:  [97.8 F (36.6 C)-98.5 F (36.9 C)] 98.5 F (36.9 C) (12/24 2002) Pulse Rate:  [52-70] 60 (12/24 2002) Resp:  [17-18] 17 (12/24 2002) BP: (109-166)/(47-94) 141/52 (12/24 2002) SpO2:  [98 %-100 %] 100 % (12/24 2002) Last BM Date : 11/22/23  Intake/Output from previous day: 12/23 0701 - 12/24 0700 In: 360 [P.O.:360] Out: -  Intake/Output this shift: No intake/output data recorded.  Physical Exam: HEENT - sclerae clear, mucous membranes moist Neck - soft Abdomen - soft, non-tender without distension  Lab Results:  Recent Labs    11/22/23 0415 11/23/23 0623  WBC 13.9* 12.5*  HGB 10.2* 10.7*  HCT 33.0* 34.3*  PLT 445* 437*   BMET Recent Labs    11/21/23 0622  NA 141  K 3.9  CL 110  CO2 23  GLUCOSE 93  BUN 7*  CREATININE 1.03*   CALCIUM 9.0   PT/INR No results for input(s): "LABPROT", "INR" in the last 72 hours. Comprehensive Metabolic Panel:    Component Value Date/Time   NA 141 11/21/2023 0622   NA 143 11/20/2023 0544   K 3.9 11/21/2023 0622   K 4.2 11/20/2023 0544   CL 110 11/21/2023 0622   CL 113 (H) 11/20/2023 0544   CO2 23 11/21/2023 0622   CO2 27 11/20/2023 0544   BUN 7 (L) 11/21/2023 0622   BUN 6 (L) 11/20/2023 0544   CREATININE 1.03 (H) 11/21/2023 0622   CREATININE 1.20 (H) 11/20/2023 0544   GLUCOSE 93 11/21/2023 0622   GLUCOSE 90 11/20/2023 0544   CALCIUM 9.0 11/21/2023 0622   CALCIUM 9.1 11/20/2023 0544   AST 18 11/14/2023 0539   AST 18 11/13/2023 0407   ALT 15 11/14/2023 0539   ALT 14 11/13/2023 0407   ALKPHOS 81 11/14/2023 0539   ALKPHOS 68 11/13/2023 0407   BILITOT 0.9 11/14/2023 0539   BILITOT 1.2 (H) 11/13/2023 0407   PROT 5.0 (L) 11/14/2023 0539   PROT 5.2 (L) 11/13/2023 0407   ALBUMIN 2.1 (L) 11/14/2023 0539   ALBUMIN 2.3 (L) 11/13/2023 0407    Studies/Results: No results found.    Stephanie Coup Sansa Alkema 11/23/2023  Patient ID: Heather Grant, female   DOB: 1941-05-01, 82 y.o.   MRN: [id number]

## 2023-11-24 DIAGNOSIS — N719 Inflammatory disease of uterus, unspecified: Secondary | ICD-10-CM | POA: Diagnosis not present

## 2023-11-24 LAB — BASIC METABOLIC PANEL
Anion gap: 7 (ref 5–15)
BUN: 10 mg/dL (ref 8–23)
CO2: 27 mmol/L (ref 22–32)
Calcium: 9.1 mg/dL (ref 8.9–10.3)
Chloride: 109 mmol/L (ref 98–111)
Creatinine, Ser: 0.96 mg/dL (ref 0.44–1.00)
GFR, Estimated: 60 mL/min — ABNORMAL LOW (ref >60)
Glucose, Bld: 126 mg/dL — ABNORMAL HIGH (ref 70–99)
Potassium: 3.7 mmol/L (ref 3.5–5.1)
Sodium: 143 mmol/L (ref 135–145)

## 2023-11-24 LAB — CBC WITH DIFFERENTIAL/PLATELET
Abs Immature Granulocytes: 0.1 10*3/uL — ABNORMAL HIGH (ref 0.00–0.07)
Basophils Absolute: 0 10*3/uL (ref 0.0–0.1)
Basophils Relative: 0 %
Eosinophils Absolute: 0.2 10*3/uL (ref 0.0–0.5)
Eosinophils Relative: 2 %
HCT: 30.3 % — ABNORMAL LOW (ref 36.0–46.0)
Hemoglobin: 9.6 g/dL — ABNORMAL LOW (ref 12.0–15.0)
Lymphocytes Relative: 32 %
Lymphs Abs: 2.9 10*3/uL (ref 0.7–4.0)
MCH: 31.4 pg (ref 26.0–34.0)
MCHC: 31.7 g/dL (ref 30.0–36.0)
MCV: 99 fL (ref 80.0–100.0)
Monocytes Absolute: 0.4 10*3/uL (ref 0.1–1.0)
Monocytes Relative: 4 %
Myelocytes: 1 %
Neutro Abs: 5.6 10*3/uL (ref 1.7–7.7)
Neutrophils Relative %: 61 %
Platelets: 375 10*3/uL (ref 150–400)
RBC: 3.06 MIL/uL — ABNORMAL LOW (ref 3.87–5.11)
RDW: 15.3 % (ref 11.5–15.5)
WBC: 9.2 10*3/uL (ref 4.0–10.5)
nRBC: 0 % (ref 0.0–0.2)
nRBC: 0 /100{WBCs}

## 2023-11-24 NOTE — Plan of Care (Signed)
  Problem: Education: Goal: Knowledge of General Education information will improve Description: Including pain rating scale, medication(s)/side effects and non-pharmacologic comfort measures Outcome: Progressing   Problem: Health Behavior/Discharge Planning: Goal: Ability to manage health-related needs will improve Outcome: Progressing   Problem: Clinical Measurements: Goal: Ability to maintain clinical measurements within normal limits will improve Outcome: Progressing Goal: Will remain free from infection Outcome: Progressing Goal: Diagnostic test results will improve Outcome: Progressing   Problem: Nutrition: Goal: Adequate nutrition will be maintained Outcome: Progressing   Problem: Coping: Goal: Level of anxiety will decrease Outcome: Progressing   Problem: Pain Management: Goal: General experience of comfort will improve Outcome: Progressing   Problem: Safety: Goal: Ability to remain free from injury will improve Outcome: Progressing   Problem: Skin Integrity: Goal: Risk for impaired skin integrity will decrease Outcome: Progressing

## 2023-11-24 NOTE — Progress Notes (Signed)
    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 - continue antibiotics per ID consultant - WBC continues to improve, 9.2 this AM   FEN - carb mod diet  VTE - lovenox ID - ceftriaxone/flagyl, E coli with good sensitivities  Disposition - patient and family seen by Dr. Angelena Form from colorectal surgery yesterday.  No plans for acute surgical intervention.  Plan continued abx's per ID recommendations.  Will follow with you.        [name], MD [facility] practice Office: [phone]        Chief Complaint: Colouterine fistula, endometritis  Subjective: Patient in bed, comfortable.  Eating.  Denies any foul-smelling drainage.  Objective: Vital signs in last 24 hours: Temp:  [97.8 F (36.6 C)-98.5 F (36.9 C)] 98.3 F (36.8 C) (12/25 0746) Pulse Rate:  [60-64] 64 (12/25 0746) Resp:  [17-18] 17 (12/25 0746) BP: (119-166)/(52-78) 141/66 (12/25 0746) SpO2:  [98 %-100 %] 98 % (12/25 0746) Weight:  [76.5 kg] 76.5 kg (12/25 0333) Last BM Date : 11/22/23  Intake/Output from previous day: 12/24 0701 - 12/25 0700 In: 800.1 [IV Piggyback:800.1] Out: -  Intake/Output this shift: Total I/O In: 240 [P.O.:240] Out: -   Physical Exam: HEENT - sclerae clear, mucous membranes moist Neck - soft Abdomen - soft, non-tender  Lab Results:  Recent Labs    11/23/23 0623 11/24/23 0520  WBC 12.5* 9.2  HGB 10.7* 9.6*  HCT 34.3* 30.3*  PLT 437* 375   BMET Recent Labs    11/24/23 0520  NA 143  K 3.7  CL 109  CO2 27  GLUCOSE 126*  BUN 10  CREATININE 0.96  CALCIUM 9.1   PT/INR No results for input(s): "LABPROT", "INR" in the last 72 hours. Comprehensive Metabolic Panel:    Component Value Date/Time   NA 143 11/24/2023 0520   NA 141 11/21/2023 0622   K 3.7 11/24/2023 0520   K 3.9 11/21/2023 0622   CL 109 11/24/2023 0520   CL 110 11/21/2023 0622   CO2 27 11/24/2023 0520   CO2 23 11/21/2023 0622   BUN  10 11/24/2023 0520   BUN 7 (L) 11/21/2023 0622   CREATININE 0.96 11/24/2023 0520   CREATININE 1.03 (H) 11/21/2023 0622   GLUCOSE 126 (H) 11/24/2023 0520   GLUCOSE 93 11/21/2023 0622   CALCIUM 9.1 11/24/2023 0520   CALCIUM 9.0 11/21/2023 0622   AST 18 11/14/2023 0539   AST 18 11/13/2023 0407   ALT 15 11/14/2023 0539   ALT 14 11/13/2023 0407   ALKPHOS 81 11/14/2023 0539   ALKPHOS 68 11/13/2023 0407   BILITOT 0.9 11/14/2023 0539   BILITOT 1.2 (H) 11/13/2023 0407   PROT 5.0 (L) 11/14/2023 0539   PROT 5.2 (L) 11/13/2023 0407   ALBUMIN 2.1 (L) 11/14/2023 0539   ALBUMIN 2.3 (L) 11/13/2023 0407    Studies/Results: No results found.    [name] 11/24/2023  Patient ID: Heather Grant, female   DOB: 1941-03-13, 82 y.o.   MRN: [id number]

## 2023-11-24 NOTE — Progress Notes (Signed)
PROGRESS NOTE  Heather Grant  DOB: 1941-08-22  PCP: Myrlene Broker, MD XBM:[id number]  DOA: 11/12/2023  LOS: 12 days  Hospital Day: 13  Brief narrative: Heather Grant is a 82 y.o. female with PMH significant for obesity, OSA, HTN, HLD, asthma 12/13, patient presented to the ED with complaint of lower abdominal discomfort.   Underwent CT abdomen pelvis, pelvic ultrasound and MRI pelvis. Results consistent with endometritis and posterior uterine body wall densely adherent to the distal sigmoid colon with suspected 1 cm in length Colo uterine fistula. Started on IV antibiotics Admitted to [facility] OB/GYN was consulted 12/16, underwent cervical dilatation  Hospitalization was prolonged because of persistent symptoms. See below for details  Subjective: Patient was seen and examined this morning.  Pleasant elderly African-American female.  Lying on bed.  Currently not in pain.  Family not at bedside.  Recalls being seen by colorectal surgeon yesterday Chart reviewed.  Hemodynamically stable. Labs from this morning with WBC count normal, hemoglobin 9.6, creatinine less than 1  Assessment and plan: Endometritis Colo uterine fistula S/p cervical dilatation 12/16 OB/GYN recommended outpatient follow-up with GYN onc to discuss surgical management.  However given persistent symptoms, she cannot be discharged. Currently being followed by colorectal surgery and GYN oncology.  Noted a plan to monitor clinically on IV antibiotics for next few days.  No plan of acute surgical intervention at this time.   Uterine fluid culture grew E. coli, Streptococcus constellatus and bacterial fragilis Evaluated by ID Currently on IV Rocephin and Flagyl Continue plan of Augmentin at discharge  E. coli UTI Secondary to colovaginal fistula antibiotic as above Antibiotic as above  PAF with RVR - resolved  new onset afib on 12/18; suspect precipitated in setting of infection Improved with  metoprolol but it was later stopped because of bradycardia Currently normal sinus rhythm without AV nodal blocking agent Since A-fib was short-lived in the setting of infection, anticoagulation was not considered  Hypokalemia/hypomagnesemia Improved with replacement.  Continue to monitor Recent Labs  Lab 11/18/23 0620 11/19/23 0643 11/20/23 0544 11/21/23 0622 11/24/23 0520  K 2.8* 4.3 4.2 3.9 3.7  MG 1.6* 1.8 1.8 1.7  --    Acute anemia Probably due to ongoing infection.  No evidence of blood loss or iron deficiency Currently hemoglobin between 9 and 10.  Continue to monitor Avoid NSAIDs Recent Labs    05/11/23 1605 11/12/23 1302 11/13/23 0407 11/14/23 0539 11/20/23 0544 11/21/23 0622 11/22/23 0415 11/23/23 0623 11/24/23 0520  HGB 13.6   < > 10.4*   < > 9.6* 9.6* 10.2* 10.7* 9.6*  MCV 96.2   < > 94.3   < > 96.5 97.8 99.1 98.8 99.0  VITAMINB12 390  --   --   --   --   --   --   --   --   FERRITIN  --   --  374*  --   --   --   --   --   --   TIBC  --   --  193*  --   --   --   --   --   --   IRON  --   --  34  --   --   --   --   --   --    < > = values in this interval not displayed.   Essential hypertension PTA meds- Lopressor 25 mg twice daily, hydralazine 25 g 3 times daily Currently blood pressure  is controlled without any scheduled meds.   Hyperlipidemia Continue Crestor   Asthma Continue with Dulera twice daily   Mobility: Encourage ambulation  Goals of care   Code Status: Full Code     DVT prophylaxis:  SCDs Start: 11/12/23 2110   Antimicrobials: IV Rocephin, IV Flagyl Fluid: None Consultants: Colorectal surgeon, GYN onc, ID Family Communication: None at bedside  Status: Inpatient Level of care:  Med-Surg   Patient is from: Home Needs to continue in-hospital care: Pending clinical course Anticipated d/c to: Hopefully home   Diet:  Diet Order             Diet regular Room service appropriate? Yes; Fluid consistency: Thin  Diet  effective now                   Scheduled Meds:  feeding supplement  237 mL Oral TID BM   latanoprost  1 drop Both Eyes QHS   mometasone-formoterol  2 puff Inhalation BID   rosuvastatin  20 mg Oral Daily    PRN meds: acetaminophen **OR** acetaminophen, albuterol, fentaNYL (SUBLIMAZE) injection, melatonin, naLOXone (NARCAN)  injection, ondansetron (ZOFRAN) IV, mouth rinse, phenol   Infusions:   cefTRIAXone (ROCEPHIN)  IV Stopped (11/23/23 2239)   metronidazole Stopped (11/23/23 2352)    Antimicrobials: Anti-infectives (From admission, onward)    Start     Dose/Rate Route Frequency Ordered Stop   11/19/23 2200  cefTRIAXone (ROCEPHIN) 2 g in sodium chloride 0.9 % 100 mL IVPB        2 g 200 mL/hr over 30 Minutes Intravenous Every 24 hours 11/19/23 1255     11/19/23 2200  metroNIDAZOLE (FLAGYL) IVPB 500 mg        500 mg 100 mL/hr over 60 Minutes Intravenous Every 12 hours 11/19/23 1255     11/19/23 1100  amoxicillin-clavulanate (AUGMENTIN) 875-125 MG per tablet 1 tablet  Status:  Discontinued        1 tablet Oral Every 12 hours 11/19/23 1028 11/19/23 1255   11/18/23 2000  cefTRIAXone (ROCEPHIN) 2 g in sodium chloride 0.9 % 100 mL IVPB  Status:  Discontinued        2 g 200 mL/hr over 30 Minutes Intravenous Every 24 hours 11/18/23 1126 11/19/23 1028   11/16/23 2000  metroNIDAZOLE (FLAGYL) IVPB 500 mg  Status:  Discontinued        500 mg 100 mL/hr over 60 Minutes Intravenous Every 12 hours 11/16/23 0846 11/19/23 1028   11/15/23 0815  ceFEPIme (MAXIPIME) 2 g in sodium chloride 0.9 % 100 mL IVPB  Status:  Discontinued        2 g 200 mL/hr over 30 Minutes Intravenous Every 12 hours 11/15/23 0804 11/18/23 1126   11/14/23 1600  metroNIDAZOLE (FLAGYL) tablet 500 mg  Status:  Discontinued        500 mg Oral Every 12 hours 11/14/23 1041 11/14/23 1318   11/14/23 1430  ceFEPIme (MAXIPIME) 2 g in sodium chloride 0.9 % 100 mL IVPB  Status:  Discontinued        2 g 200 mL/hr over 30  Minutes Intravenous Every 24 hours 11/14/23 1335 11/15/23 0804   11/14/23 1430  metroNIDAZOLE (FLAGYL) IVPB 500 mg  Status:  Discontinued        500 mg 100 mL/hr over 60 Minutes Intravenous Every 12 hours 11/14/23 1335 11/16/23 0846   11/13/23 1200  cefTRIAXone (ROCEPHIN) 2 g in sodium chloride 0.9 % 100 mL IVPB  Status:  Discontinued  2 g 200 mL/hr over 30 Minutes Intravenous Every 24 hours 11/12/23 2111 11/14/23 1318   11/13/23 0500  metroNIDAZOLE (FLAGYL) IVPB 500 mg  Status:  Discontinued        500 mg 100 mL/hr over 60 Minutes Intravenous Every 12 hours 11/12/23 2111 11/14/23 1041   11/12/23 1715  metroNIDAZOLE (FLAGYL) IVPB 500 mg        500 mg 100 mL/hr over 60 Minutes Intravenous  Once 11/12/23 1713 11/12/23 1849   11/12/23 1615  cefTRIAXone (ROCEPHIN) 1 g in sodium chloride 0.9 % 100 mL IVPB        1 g 200 mL/hr over 30 Minutes Intravenous  Once 11/12/23 1605 11/12/23 1700       Objective: Vitals:   11/24/23 0332 11/24/23 0746  BP: 119/68 (!) 141/66  Pulse: 62 64  Resp: 18 17  Temp: 98.2 F (36.8 C) 98.3 F (36.8 C)  SpO2: 98% 98%    Intake/Output Summary (Last 24 hours) at 11/24/2023 0956 Last data filed at 11/24/2023 0900 Gross per 24 hour  Intake 1040.12 ml  Output --  Net 1040.12 ml   Filed Weights   11/21/23 0440 11/22/23 0500 11/24/23 0333  Weight: 80.4 kg 80.5 kg 76.5 kg   Weight change:  Body mass index is 34.06 kg/m.   Physical Exam: General exam: Pleasant, elderly African-American female.  Not in distress Skin: No rashes, lesions or ulcers. HEENT: Atraumatic, normocephalic, no obvious bleeding Lungs: Clear to auscultation bilaterally,  CVS: S1, S2, no murmur,   GI/Abd: Soft, lower abdominal tenderness present, nondistended, bowel sound present,  CNS: Alert, awake, oriented x 3 Psychiatry: Mood appropriate,  Extremities: No pedal edema, no calf tenderness,   Data Review: I have personally reviewed the laboratory data and studies  available.  F/u labs ordered Unresulted Labs (From admission, onward)     Start     Ordered   11/21/23 0500  CBC with Differential/Platelet  Daily,   R     Question:  Specimen collection method  Answer:  Lab=Lab collect   11/20/23 0906            Total time spent in review of labs and imaging, patient evaluation, formulation of plan, documentation and communication with family: 45 minutes  Signed, Lorin Glass, MD [facility] 11/24/2023

## 2023-11-24 NOTE — Progress Notes (Signed)
Patient resting quietly in bed without complaints this shift. Patient's daughter, grandaughter, and son visited earlier today and her other daughter Heather Grant is currently at [facility] with many questions and concerns.   The following secure chat message was sent to the respective on-call treatment teams per request by pt's daughter Heather Grant.  "Hello, Patient's daughter Heather Grant is at bedside with many questions and concerns RE: Surgical plan for Mom. She is requesting a [facility] surgeon between [employer], [place], other sister, and brother) with GI Surgery and GYN ONC to discuss colorectal fistula repair and hysterectomy to be coordinated into one trip to the OR.   She was advised as the RN I would forward her request to all parties, but have no control beyond this. Thank you in advance for your help."  Patient remains with VS WNL for her, she denies pain or other issues.  Surgical Teams please f/u with patient and Heather Grant (daughter) to coordinate Family Meeting to discuss surgical options/plan.

## 2023-11-25 ENCOUNTER — Encounter (HOSPITAL_COMMUNITY): Payer: Self-pay | Admitting: Internal Medicine

## 2023-11-25 DIAGNOSIS — N719 Inflammatory disease of uterus, unspecified: Secondary | ICD-10-CM | POA: Diagnosis not present

## 2023-11-25 DIAGNOSIS — D649 Anemia, unspecified: Secondary | ICD-10-CM | POA: Diagnosis not present

## 2023-11-25 DIAGNOSIS — N824 Other female intestinal-genital tract fistulae: Secondary | ICD-10-CM | POA: Diagnosis not present

## 2023-11-25 DIAGNOSIS — N3000 Acute cystitis without hematuria: Secondary | ICD-10-CM | POA: Diagnosis not present

## 2023-11-25 LAB — CBC WITH DIFFERENTIAL/PLATELET
Abs Immature Granulocytes: 0.35 10*3/uL — ABNORMAL HIGH (ref 0.00–0.07)
Basophils Absolute: 0.1 10*3/uL (ref 0.0–0.1)
Basophils Relative: 2 %
Eosinophils Absolute: 0.2 10*3/uL (ref 0.0–0.5)
Eosinophils Relative: 2 %
HCT: 29.7 % — ABNORMAL LOW (ref 36.0–46.0)
Hemoglobin: 9.3 g/dL — ABNORMAL LOW (ref 12.0–15.0)
Immature Granulocytes: 4 %
Lymphocytes Relative: 37 %
Lymphs Abs: 2.9 10*3/uL (ref 0.7–4.0)
MCH: 31 pg (ref 26.0–34.0)
MCHC: 31.3 g/dL (ref 30.0–36.0)
MCV: 99 fL (ref 80.0–100.0)
Monocytes Absolute: 0.7 10*3/uL (ref 0.1–1.0)
Monocytes Relative: 9 %
Neutro Abs: 3.6 10*3/uL (ref 1.7–7.7)
Neutrophils Relative %: 46 %
Platelets: 329 10*3/uL (ref 150–400)
RBC: 3 MIL/uL — ABNORMAL LOW (ref 3.87–5.11)
RDW: 15.6 % — ABNORMAL HIGH (ref 11.5–15.5)
WBC: 7.9 10*3/uL (ref 4.0–10.5)
nRBC: 0 % (ref 0.0–0.2)

## 2023-11-25 NOTE — Progress Notes (Signed)
    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 - continue antibiotics per ID consultant - WBC continues to improve, 7.9 this AM   FEN - carb mod diet  VTE - lovenox ID - ceftriaxone/flagyl, E coli with good sensitivities   Family at bedside.  Confused about recommendations and plans for care going forward.  At this time, surgery recommends continuing with abx's as per ID recommendations and following patient.  No plans for acute surgical intervention.  Will discuss with Dr. Cliffton Asters.  He may contact GYN and medical service to discuss.        [name], MD [facility] Surgery A [facility] practice Office: [phone]        Chief Complaint: Colouterine fistula, endometritis  Subjective: Patient in bed, comfortable, no complaints.  Family at bedside.  Objective: Vital signs in last 24 hours: Temp:  [98 F (36.7 C)-98.2 F (36.8 C)] 98.2 F (36.8 C) (12/26 0849) Pulse Rate:  [60-72] 60 (12/26 0849) Resp:  [18] 18 (12/26 0849) BP: (128-155)/(49-76) 139/49 (12/26 0849) SpO2:  [99 %-100 %] 100 % (12/26 0849) Weight:  [76.8 kg] 76.8 kg (12/26 0458) Last BM Date : 11/24/23  Intake/Output from previous day: 12/25 0701 - 12/26 0700 In: 1563.7 [P.O.:1300; IV Piggyback:263.7] Out: -  Intake/Output this shift: No intake/output data recorded.  Physical Exam: HEENT - sclerae clear, mucous membranes moist Abdomen - soft without distension; non-tender; no mass   Lab Results:  Recent Labs    11/24/23 0520 11/25/23 0644  WBC 9.2 7.9  HGB 9.6* 9.3*  HCT 30.3* 29.7*  PLT 375 329   BMET Recent Labs    11/24/23 0520  NA 143  K 3.7  CL 109  CO2 27  GLUCOSE 126*  BUN 10  CREATININE 0.96  CALCIUM 9.1   PT/INR No results for input(s): "LABPROT", "INR" in the last 72 hours. Comprehensive Metabolic Panel:    Component Value Date/Time   NA 143 11/24/2023 0520   NA 141 11/21/2023 0622   K 3.7 11/24/2023 0520   K 3.9 11/21/2023  0622   CL 109 11/24/2023 0520   CL 110 11/21/2023 0622   CO2 27 11/24/2023 0520   CO2 23 11/21/2023 0622   BUN 10 11/24/2023 0520   BUN 7 (L) 11/21/2023 0622   CREATININE 0.96 11/24/2023 0520   CREATININE 1.03 (H) 11/21/2023 0622   GLUCOSE 126 (H) 11/24/2023 0520   GLUCOSE 93 11/21/2023 0622   CALCIUM 9.1 11/24/2023 0520   CALCIUM 9.0 11/21/2023 0622   AST 18 11/14/2023 0539   AST 18 11/13/2023 0407   ALT 15 11/14/2023 0539   ALT 14 11/13/2023 0407   ALKPHOS 81 11/14/2023 0539   ALKPHOS 68 11/13/2023 0407   BILITOT 0.9 11/14/2023 0539   BILITOT 1.2 (H) 11/13/2023 0407   PROT 5.0 (L) 11/14/2023 0539   PROT 5.2 (L) 11/13/2023 0407   ALBUMIN 2.1 (L) 11/14/2023 0539   ALBUMIN 2.3 (L) 11/13/2023 0407    Studies/Results: No results found.    [name] 11/25/2023  Patient ID: Heather Grant, female   DOB: [birth year], 82 y.o.   MRN: [id number]

## 2023-11-25 NOTE — Progress Notes (Signed)
Physical Therapy Treatment Patient Details Name: Heather Grant MRN: [id number] DOB: 1941-06-23 Today's Date: 11/25/2023   History of Present Illness Pt is a 82 y.o. F who presents 11/12/2023 with lower abdominal pain and possible endometriosis as evident on CT of abdomen and pelvis. MRI pelvis showing moderate sigmoid diverticulosis. Underwent cervical dilation on 11/15/23 and receiving IV ABX. Significant PMH: HLD, moderate persistent asthma.    PT Comments  Pt tolerating increased mobility. Has ambulated with mobility and now able to ambulate with PT. Worked on gait pattern which is antalgic due to knee pain. Requested family bring shoes for increased cushion. Pt performed LE there ex in sitting. Pt relays frustration with feeling confused and having STM deficits and not being able to remember what doctors have told her. She could not remember what we discussed beginning of session by end of session. Recommend all important info be relayed to appropriate family. Pt also relays that she does not feel safe living alone any longer and is hoping to d/c to one of her kids' homes. PT will continue to follow.     If plan is discharge home, recommend the following: A little help with walking and/or transfers;A little help with bathing/dressing/bathroom;Assistance with cooking/housework;Assist for transportation;Help with stairs or ramp for entrance   Can travel by private vehicle     Yes  Equipment Recommendations  None recommended by PT    Recommendations for Other Services       Precautions / Restrictions Precautions Precautions: Fall Restrictions Weight Bearing Restrictions Per Provider Order: No     Mobility  Bed Mobility               General bed mobility comments: received in chair    Transfers Overall transfer level: Needs assistance Equipment used: Rolling walker (2 wheels) Transfers: Sit to/from Stand Sit to Stand: Min assist           General transfer  comment: first attempt to stand from chair unsuccessful. Cued to scoot out to edge and was then able to rise with min A. Needed min A each subsequent stand as well    Ambulation/Gait Ambulation/Gait assistance: Min assist Gait Distance (Feet): 100 Feet Assistive device: Rolling walker (2 wheels) Gait Pattern/deviations: Step-through pattern, Decreased stride length Gait velocity: decreased Gait velocity interpretation: <1.31 ft/sec, indicative of household ambulator   General Gait Details: antalgic gait due to knee discomfort. Requested pt call her family to bring shoes for increased cushion   Stairs             Wheelchair Mobility     Tilt Bed    Modified Rankin (Stroke Patients Only)       Balance Overall balance assessment: Needs assistance Sitting-balance support: Feet supported Sitting balance-Leahy Scale: Good     Standing balance support: Bilateral upper extremity supported Standing balance-Leahy Scale: Poor Standing balance comment: reliant on RW                            Cognition Arousal: Alert Behavior During Therapy: WFL for tasks assessed/performed Overall Cognitive Status: Impaired/Different from baseline Area of Impairment: Problem solving, Memory                     Memory: Decreased short-term memory Following Commands: Follows one step commands with increased time     Problem Solving: Slow processing, Requires verbal cues General Comments: delayed processing. Cannot remember by end of session what we  talked about at beginning.        Exercises General Exercises - Lower Extremity Ankle Circles/Pumps: AROM, Both, 10 reps, Seated    General Comments General comments (skin integrity, edema, etc.): VSS. Reviewed with pt MD note from this AM since she reported she could not remember what anyone said. Pt relays that after this she does not want to live alone anymore. Has son and 2 daughters and thinks she could live with  one of them      Pertinent Vitals/Pain Pain Assessment Pain Assessment: Faces Faces Pain Scale: Hurts little more Pain Location: B knees R>L (OA) Pain Descriptors / Indicators: Aching, Sore Pain Intervention(s): Limited activity within patient's tolerance, Monitored during session    Home Living                          Prior Function            PT Goals (current goals can now be found in the care plan section) Acute Rehab PT Goals Patient Stated Goal: return to independence, not live alone PT Goal Formulation: With patient Time For Goal Achievement: 11/29/23 Potential to Achieve Goals: Good Progress towards PT goals: Progressing toward goals    Frequency    Min 1X/week      PT Plan      Co-evaluation              AM-PAC PT "6 Clicks" Mobility   Outcome Measure  Help needed turning from your back to your side while in a flat bed without using bedrails?: None Help needed moving from lying on your back to sitting on the side of a flat bed without using bedrails?: A Little Help needed moving to and from a bed to a chair (including a wheelchair)?: A Little Help needed standing up from a chair using your arms (e.g., wheelchair or bedside chair)?: A Little Help needed to walk in hospital room?: A Little Help needed climbing 3-5 steps with a railing? : A Lot 6 Click Score: 18    End of Session Equipment Utilized During Treatment: Gait belt Activity Tolerance: Patient tolerated treatment well Patient left: in chair;with call bell/phone within reach;with chair alarm set Nurse Communication: Mobility status PT Visit Diagnosis: Unsteadiness on feet (R26.81);Difficulty in walking, not elsewhere classified (R26.2)     Time: 4098-1191 PT Time Calculation (min) (ACUTE ONLY): 30 min  Charges:    $Gait Training: 23-37 mins PT General Charges $$ ACUTE PT VISIT: 1 Visit                     Lyanne Co, PT  Acute Rehab Services Secure chat  preferred Office [phone]    Heather Grant 11/25/2023, 1:36 PM

## 2023-11-25 NOTE — Progress Notes (Signed)
PROGRESS NOTE  Heather Grant  DOB: 1941/03/16  PCP: Myrlene Broker, MD BMW:[id number]  DOA: 11/12/2023  LOS: 13 days  Hospital Day: 14  Brief narrative: Heather Grant is a 82 y.o. female with PMH significant for obesity, OSA, HTN, HLD, asthma 12/13, patient presented to the ED with complaint of lower abdominal discomfort.   Underwent CT abdomen pelvis, pelvic ultrasound and MRI pelvis. Results consistent with endometritis and posterior uterine body wall densely adherent to the distal sigmoid colon with suspected 1 cm in length Colo uterine fistula. Started on IV antibiotics Admitted to [facility] OB/GYN was consulted 12/16, underwent cervical dilatation  Hospitalization was prolonged because of persistent symptoms. See below for details  Subjective: Patient was seen and examined this morning.   Pleasant elderly African-American female.  Lying down in bed.  Not in distress.   Currently pain-free. Family was not at the bedside at the time of my evaluation. Family was in the room last night and requested to speak with the surgeons.  RN sent a group chat with GYN oncology and colorectal surgery included as well. To my understanding, surgeons to speak with family today.  Assessment and plan: Endometritis Colo uterine fistula S/p cervical dilatation 12/16 OB/GYN recommended outpatient follow-up with GYN onc to discuss surgical management.  However given persistent symptoms, she cannot be discharged. Currently being followed by colorectal surgery and GYN oncology.  Noted a plan to monitor clinically on IV antibiotics for next few days.  Services to speak with family today  uterine fluid culture grew E. coli, Streptococcus constellatus and bacterial fragilis Evaluated by ID Currently on IV Rocephin and Flagyl Continue plan of Augmentin at discharge  E. coli UTI Secondary to colovaginal fistula antibiotic as above Antibiotic as above  PAF with RVR - resolved  new  onset afib on 12/18; suspect precipitated in setting of infection Improved with metoprolol but it was later stopped because of bradycardia Currently normal sinus rhythm without AV nodal blocking agent Since A-fib was short-lived in the setting of infection, anticoagulation was not considered  Hypokalemia/hypomagnesemia Improved with replacement.  Continue to monitor Recent Labs  Lab 11/19/23 0643 11/20/23 0544 11/21/23 0622 11/24/23 0520  K 4.3 4.2 3.9 3.7  MG 1.8 1.8 1.7  --    Acute anemia Probably due to ongoing infection.  No evidence of blood loss or iron deficiency Currently hemoglobin between 9 and 10.  Continue to monitor Avoid NSAIDs Recent Labs    05/11/23 1605 11/12/23 1302 11/13/23 0407 11/14/23 0539 11/21/23 0622 11/22/23 0415 11/23/23 0623 11/24/23 0520 11/25/23 0644  HGB 13.6   < > 10.4*   < > 9.6* 10.2* 10.7* 9.6* 9.3*  MCV 96.2   < > 94.3   < > 97.8 99.1 98.8 99.0 99.0  VITAMINB12 390  --   --   --   --   --   --   --   --   FERRITIN  --   --  374*  --   --   --   --   --   --   TIBC  --   --  193*  --   --   --   --   --   --   IRON  --   --  34  --   --   --   --   --   --    < > = values in this interval not displayed.   Essential hypertension PTA  meds- Lopressor 25 mg twice daily, hydralazine 25 g 3 times daily Currently blood pressure is controlled without any scheduled meds.   Hyperlipidemia Continue Crestor   Asthma Continue with Dulera twice daily   Mobility: Encourage ambulation  Goals of care   Code Status: Full Code     DVT prophylaxis:  SCDs Start: 11/12/23 2110   Antimicrobials: IV Rocephin, IV Flagyl Fluid: None Consultants: Colorectal surgeon, GYN onc, ID Family Communication: None at bedside  Status: Inpatient Level of care:  Med-Surg   Patient is from: Home Needs to continue in-hospital care: Pending clinical course and determination on the time of surgery. Anticipated d/c to: Hopefully home   Diet:  Diet Order              Diet regular Room service appropriate? Yes; Fluid consistency: Thin  Diet effective now                   Scheduled Meds:  feeding supplement  237 mL Oral TID BM   latanoprost  1 drop Both Eyes QHS   mometasone-formoterol  2 puff Inhalation BID   rosuvastatin  20 mg Oral Daily    PRN meds: acetaminophen **OR** acetaminophen, albuterol, fentaNYL (SUBLIMAZE) injection, melatonin, naLOXone (NARCAN)  injection, ondansetron (ZOFRAN) IV, mouth rinse, phenol   Infusions:   cefTRIAXone (ROCEPHIN)  IV 2 g (11/24/23 2036)   metronidazole 500 mg (11/24/23 2135)    Antimicrobials: Anti-infectives (From admission, onward)    Start     Dose/Rate Route Frequency Ordered Stop   11/19/23 2200  cefTRIAXone (ROCEPHIN) 2 g in sodium chloride 0.9 % 100 mL IVPB        2 g 200 mL/hr over 30 Minutes Intravenous Every 24 hours 11/19/23 1255     11/19/23 2200  metroNIDAZOLE (FLAGYL) IVPB 500 mg        500 mg 100 mL/hr over 60 Minutes Intravenous Every 12 hours 11/19/23 1255     11/19/23 1100  amoxicillin-clavulanate (AUGMENTIN) 875-125 MG per tablet 1 tablet  Status:  Discontinued        1 tablet Oral Every 12 hours 11/19/23 1028 11/19/23 1255   11/18/23 2000  cefTRIAXone (ROCEPHIN) 2 g in sodium chloride 0.9 % 100 mL IVPB  Status:  Discontinued        2 g 200 mL/hr over 30 Minutes Intravenous Every 24 hours 11/18/23 1126 11/19/23 1028   11/16/23 2000  metroNIDAZOLE (FLAGYL) IVPB 500 mg  Status:  Discontinued        500 mg 100 mL/hr over 60 Minutes Intravenous Every 12 hours 11/16/23 0846 11/19/23 1028   11/15/23 0815  ceFEPIme (MAXIPIME) 2 g in sodium chloride 0.9 % 100 mL IVPB  Status:  Discontinued        2 g 200 mL/hr over 30 Minutes Intravenous Every 12 hours 11/15/23 0804 11/18/23 1126   11/14/23 1600  metroNIDAZOLE (FLAGYL) tablet 500 mg  Status:  Discontinued        500 mg Oral Every 12 hours 11/14/23 1041 11/14/23 1318   11/14/23 1430  ceFEPIme (MAXIPIME) 2 g in sodium  chloride 0.9 % 100 mL IVPB  Status:  Discontinued        2 g 200 mL/hr over 30 Minutes Intravenous Every 24 hours 11/14/23 1335 11/15/23 0804   11/14/23 1430  metroNIDAZOLE (FLAGYL) IVPB 500 mg  Status:  Discontinued        500 mg 100 mL/hr over 60 Minutes Intravenous Every 12 hours 11/14/23 1335  11/16/23 0846   11/13/23 1200  cefTRIAXone (ROCEPHIN) 2 g in sodium chloride 0.9 % 100 mL IVPB  Status:  Discontinued        2 g 200 mL/hr over 30 Minutes Intravenous Every 24 hours 11/12/23 2111 11/14/23 1318   11/13/23 0500  metroNIDAZOLE (FLAGYL) IVPB 500 mg  Status:  Discontinued        500 mg 100 mL/hr over 60 Minutes Intravenous Every 12 hours 11/12/23 2111 11/14/23 1041   11/12/23 1715  metroNIDAZOLE (FLAGYL) IVPB 500 mg        500 mg 100 mL/hr over 60 Minutes Intravenous  Once 11/12/23 1713 11/12/23 1849   11/12/23 1615  cefTRIAXone (ROCEPHIN) 1 g in sodium chloride 0.9 % 100 mL IVPB        1 g 200 mL/hr over 30 Minutes Intravenous  Once 11/12/23 1605 11/12/23 1700       Objective: Vitals:   11/25/23 0456 11/25/23 0849  BP: (!) 155/76 (!) 139/49  Pulse: 66 60  Resp: 18 18  Temp: 98 F (36.7 C) 98.2 F (36.8 C)  SpO2: 100% 100%    Intake/Output Summary (Last 24 hours) at 11/25/2023 0926 Last data filed at 11/25/2023 0600 Gross per 24 hour  Intake 1323.7 ml  Output --  Net 1323.7 ml   Filed Weights   11/22/23 0500 11/24/23 0333 11/25/23 0458  Weight: 80.5 kg 76.5 kg 76.8 kg   Weight change: 0.3 kg Body mass index is 34.2 kg/m.   Physical Exam: General exam: Pleasant, elderly African-American female.  Not in distress Skin: No rashes, lesions or ulcers. HEENT: Atraumatic, normocephalic, no obvious bleeding Lungs: Clear to auscultation bilaterally,  CVS: S1, S2, no murmur,   GI/Abd: Soft, lower abdominal tenderness present, nondistended, bowel sound present,  CNS: Alert, awake, oriented x 3 Psychiatry: Mood appropriate,  Extremities: No pedal edema, no calf  tenderness,   Data Review: I have personally reviewed the laboratory data and studies available.  F/u labs ordered Unresulted Labs (From admission, onward)     Start     Ordered   11/21/23 0500  CBC with Differential/Platelet  Daily,   R     Question:  Specimen collection method  Answer:  Lab=Lab collect   11/20/23 0906            Total time spent in review of labs and imaging, patient evaluation, formulation of plan, documentation and communication with family: 25 minutes  Signed, Lorin Glass, MD [facility] 11/25/2023
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
## 2023-11-25 NOTE — Progress Notes (Signed)
Let family know that Tuker,MD stated she will be here between 5:30-6pm. Family verbalized understanding.

## 2023-11-25 NOTE — Consult Note (Addendum)
10 Days Post-Op Procedure(s) (LRB): Cervical Dilation (N/A) Patient's family called my office and requested that I see patient as they have questions about her care.   Subjective: Patient reports feeling better.  She denies abdominal or pelvic pain. She is tolerating a regular diet.   She denies fevers/chills.   Objective: Blood pressure (!) 131/50, pulse 64, temperature 98.1 F (36.7 C), temperature source Oral, resp. rate 18, height 4\' 11"  (1.499 m), weight 76.8 kg, SpO2 100%.     11/25/2023    3:51 PM 11/25/2023    8:49 AM 11/25/2023    4:58 AM  Vitals with BMI  Weight   169 lbs 5 oz  BMI   34.18  Systolic 131 139   Diastolic 50 49   Pulse 64 60     General: alert, cooperative, and no distress Resp: normal breathing movement.  Cardio: regular rate.  GI: soft, non-tender; bowel sounds normal; no masses,  no organomegaly Extremities: extremities normal, atraumatic, no cyanosis or edema and no edema, redness or tenderness in the calves or thighs Vaginal Bleeding: none     Latest Ref Rng & Units 11/25/2023    6:44 AM 11/24/2023    5:20 AM 11/23/2023    6:23 AM  CBC  WBC 4.0 - 10.5 K/uL 7.9  9.2  12.5   Hemoglobin 12.0 - 15.0 g/dL 9.3  9.6  16.1   Hematocrit 36.0 - 46.0 % 29.7  30.3  34.3   Platelets 150 - 400 K/uL 329  375  437       Current Facility-Administered Medications:    acetaminophen (TYLENOL) tablet 650 mg, 650 mg, Oral, Q6H PRN, 650 mg at 11/19/23 1613 **OR** acetaminophen (TYLENOL) suppository 650 mg, 650 mg, Rectal, Q6H PRN, Osborn Coho, MD   albuterol (PROVENTIL) (2.5 MG/3ML) 0.083% nebulizer solution 2.5 mg, 2.5 mg, Nebulization, Q4H PRN, Osborn Coho, MD   cefTRIAXone (ROCEPHIN) 2 g in sodium chloride 0.9 % 100 mL IVPB, 2 g, Intravenous, Q24H, Lewie Chamber, MD, Last Rate: 200 mL/hr at 11/24/23 2036, 2 g at 11/24/23 2036   feeding supplement (ENSURE ENLIVE / ENSURE PLUS) liquid 237 mL, 237 mL, Oral, TID BM, Kabrich, Martha H, PA-C, 237 mL at  11/25/23 1322   fentaNYL (SUBLIMAZE) injection 25 mcg, 25 mcg, Intravenous, Q2H PRN, Osborn Coho, MD   latanoprost (XALATAN) 0.005 % ophthalmic solution 1 drop, 1 drop, Both Eyes, QHS, Osborn Coho, MD, 1 drop at 11/24/23 2029   melatonin tablet 3 mg, 3 mg, Oral, QHS PRN, Osborn Coho, MD   metroNIDAZOLE (FLAGYL) IVPB 500 mg, 500 mg, Intravenous, Q12H, Lewie Chamber, MD, Last Rate: 100 mL/hr at 11/25/23 0945, 500 mg at 11/25/23 0945   mometasone-formoterol (DULERA) 100-5 MCG/ACT inhaler 2 puff, 2 puff, Inhalation, BID, Osborn Coho, MD, 2 puff at 11/24/23 2057   naloxone [facility]) injection 0.4 mg, 0.4 mg, Intravenous, PRN, Osborn Coho, MD   ondansetron [facility]) injection 4 mg, 4 mg, Intravenous, Q6H PRN, Osborn Coho, MD, 4 mg at 11/18/23 1907   Oral care mouth rinse, 15 mL, Mouth Rinse, PRN, Osborn Coho, MD   phenol (CHLORASEPTIC) mouth spray 1 spray, 1 spray, Mouth/Throat, PRN, Osborn Coho, MD, 1 spray at 11/15/23 2137   rosuvastatin (CRESTOR) tablet 20 mg, 20 mg, Oral, Daily, Osborn Coho, MD, 20 mg at 11/25/23 0960   Assessment: 82 y/o female admitted  with abdominal pain and found to have colo-uterine fistula, diverticulosis and endometritis, s/p Procedure(s): Cervical Dilation (N/A): stable and improving  Plan: Discussed with  patient and her two daughters at bedside.  Patient is improving clinically therefore colo-uterine fistula surgery may not be needed now.  Given that her endometritis has improved she may not required a hysterectomy at this time. We discussed determination for surgery as colorectal surgery and at time of that surgery, if a hysterectomy will be needed then gynecology oncology will perform the hysterectomy due to complex nature of the surgery. Patient and her daughters expressed understanding.  Continue ABX therapy due to endometritis and colo-uterine fistula. Surgery and Gynecology oncology are following up with patient- appreciate their  input.    LOS: 13 days  Total time spent in review of labs and imaging, patient evaluation, formulation of plan, documentation and communication with family: 35 minutes.   Prescilla Sours, MD 11/25/2023, 7:19 PM

## 2023-11-25 NOTE — Progress Notes (Signed)
   11/25/23 1021  Mobility  Activity Ambulated with assistance in hallway  Level of Assistance Contact guard assist, steadying assist  Assistive Device Front wheel walker  Distance Ambulated (ft) 50 ft  Activity Response Tolerated fair  Mobility Referral Yes  Mobility visit 1 Mobility  Mobility Specialist Start Time (ACUTE ONLY) 1007  Mobility Specialist Stop Time (ACUTE ONLY) 1021  Mobility Specialist Time Calculation (min) (ACUTE ONLY) 14 min   Mobility Specialist: Progress Note  Pt agreeable to mobility session - received in bed. Required CG using RW. C/o BLE knee soreness throughout. Returned to [facility] with all needs met - call bell within reach. NT, daughters, and son present.  Barnie Mort, BS Mobility Specialist Please contact via SecureChat or Rehab office at [phone].

## 2023-11-25 NOTE — Progress Notes (Signed)
Patients Daughter requesting that MD come to room and speak with her because she has some questions. Dahal,MD made aware.

## 2023-11-26 DIAGNOSIS — N719 Inflammatory disease of uterus, unspecified: Secondary | ICD-10-CM | POA: Diagnosis not present

## 2023-11-26 LAB — CBC WITH DIFFERENTIAL/PLATELET
Abs Immature Granulocytes: 0.23 10*3/uL — ABNORMAL HIGH (ref 0.00–0.07)
Basophils Absolute: 0.2 10*3/uL — ABNORMAL HIGH (ref 0.0–0.1)
Basophils Relative: 2 %
Eosinophils Absolute: 0.2 10*3/uL (ref 0.0–0.5)
Eosinophils Relative: 3 %
HCT: 30.8 % — ABNORMAL LOW (ref 36.0–46.0)
Hemoglobin: 9.5 g/dL — ABNORMAL LOW (ref 12.0–15.0)
Immature Granulocytes: 3 %
Lymphocytes Relative: 39 %
Lymphs Abs: 3.2 10*3/uL (ref 0.7–4.0)
MCH: 30.8 pg (ref 26.0–34.0)
MCHC: 30.8 g/dL (ref 30.0–36.0)
MCV: 100 fL (ref 80.0–100.0)
Monocytes Absolute: 0.8 10*3/uL (ref 0.1–1.0)
Monocytes Relative: 9 %
Neutro Abs: 3.7 10*3/uL (ref 1.7–7.7)
Neutrophils Relative %: 44 %
Platelets: 331 10*3/uL (ref 150–400)
RBC: 3.08 MIL/uL — ABNORMAL LOW (ref 3.87–5.11)
RDW: 15.8 % — ABNORMAL HIGH (ref 11.5–15.5)
WBC: 8.3 10*3/uL (ref 4.0–10.5)
nRBC: 0 % (ref 0.0–0.2)

## 2023-11-26 LAB — AEROBIC/ANAEROBIC CULTURE W GRAM STAIN (SURGICAL/DEEP WOUND)

## 2023-11-26 MED ORDER — AMOXICILLIN-POT CLAVULANATE 875-125 MG PO TABS
1.0000 | ORAL_TABLET | Freq: Two times a day (BID) | ORAL | Status: DC
  Administered 2023-11-26 – 2023-11-28 (×5): 1 via ORAL
  Filled 2023-11-26 (×5): qty 1

## 2023-11-26 NOTE — Plan of Care (Signed)
  Problem: Education: Goal: Knowledge of General Education information will improve Description: Including pain rating scale, medication(s)/side effects and non-pharmacologic comfort measures Outcome: Progressing   Problem: Health Behavior/Discharge Planning: Goal: Ability to manage health-related needs will improve Outcome: Progressing   Problem: Clinical Measurements: Goal: Will remain free from infection Outcome: Progressing Goal: Respiratory complications will improve Outcome: Progressing   Problem: Activity: Goal: Risk for activity intolerance will decrease Outcome: Progressing   Problem: Nutrition: Goal: Adequate nutrition will be maintained Outcome: Progressing   Problem: Coping: Goal: Level of anxiety will decrease Outcome: Progressing   

## 2023-11-26 NOTE — Progress Notes (Signed)
    11 Days Post-Op  Subjective: No new issues this morning, feels better. Family not at bedside this morning.   Objective: Vital signs in last 24 hours: Temp:  [97.9 F (36.6 C)-98.2 F (36.8 C)] 98 F (36.7 C) (12/27 0736) Pulse Rate:  [59-64] 64 (12/27 0851) Resp:  [18] 18 (12/27 0736) BP: (121-177)/(50-154) 124/55 (12/27 0851) SpO2:  [98 %-100 %] 98 % (12/27 0903) Weight:  [74.9 kg] 74.9 kg (12/27 0539) Last BM Date : 11/25/23  Intake/Output from previous day: 12/26 0701 - 12/27 0700 In: 874.9 [P.O.:870; IV Piggyback:4.9] Out: -  Intake/Output this shift: Total I/O In: 151.7 [IV Piggyback:151.7] Out: -   PE: General: resting comfortably, NAD Neuro: alert and oriented, no focal deficits Resp: normal work of breathing on room air Abdomen: soft, nondistended, nontender to palpation.  Extremities: warm and well-perfused    Lab Results:  Recent Labs    11/25/23 0644 11/26/23 0521  WBC 7.9 8.3  HGB 9.3* 9.5*  HCT 29.7* 30.8*  PLT 329 331   BMET Recent Labs    11/24/23 0520  NA 143  K 3.7  CL 109  CO2 27  GLUCOSE 126*  BUN 10  CREATININE 0.96  CALCIUM 9.1   PT/INR No results for input(s): "LABPROT", "INR" in the last 72 hours. CMP     Component Value Date/Time   NA 143 11/24/2023 0520   K 3.7 11/24/2023 0520   CL 109 11/24/2023 0520   CO2 27 11/24/2023 0520   GLUCOSE 126 (H) 11/24/2023 0520   BUN 10 11/24/2023 0520   CREATININE 0.96 11/24/2023 0520   CALCIUM 9.1 11/24/2023 0520   PROT 5.0 (L) 11/14/2023 0539   ALBUMIN 2.1 (L) 11/14/2023 0539   AST 18 11/14/2023 0539   ALT 15 11/14/2023 0539   ALKPHOS 81 11/14/2023 0539   BILITOT 0.9 11/14/2023 0539   GFRNONAA 60 (L) 11/24/2023 0520   GFRAA 56 (L) 11/19/2012 1708   Lipase     Component Value Date/Time   LIPASE <10 (L) 11/12/2023 1302      Assessment/Plan 82 yo female with a colouterine fistula. Reviewed gyn notes and discussed case with colorectal surgery. Patient has clinically  improved, with a normal WBC and is currently asymptomatic without significant discharge. No plans for acute surgical intervention this admission. Transitioned to oral antibiotics. General surgery will see as needed if patient remains here during the weekend. Please call with any further questions or concerns.    LOS: 14 days    Sophronia Simas, MD [facility] Surgery General, Hepatobiliary and Pancreatic Surgery 11/26/23 11:39 AM

## 2023-11-26 NOTE — Progress Notes (Signed)
PROGRESS NOTE  Heather Grant  DOB: July 22, 1941  PCP: Myrlene Broker, MD NGE:[id number]  DOA: 11/12/2023  LOS: 14 days  Hospital Day: 15  Brief narrative: Heather Grant is a 82 y.o. female with PMH significant for obesity, OSA, HTN, HLD, asthma 12/13, patient presented to the ED with complaint of lower abdominal discomfort. Underwent CT abdomen pelvis, pelvic ultrasound and MRI pelvis. Results consistent with endometritis and posterior uterine body wall densely adherent to the distal sigmoid colon with suspected 1 cm in length Colo uterine fistula. Started on IV antibiotics Admitted to [facility] OB/GYN was consulted 12/16, underwent cervical dilatation to help uterine contents to spontaneously drain  Subjective: Patient was seen and examined this morning.  Sitting up in chair.  Not in distress. Feels good.  No new symptoms. Family not at bedside at the time of my evaluation.  Assessment and plan: Endometritis Colo uterine fistula S/p cervical dilatation -12/16 Dr. Su Hilt Since the cervical dilatation on 12/16, patient has remained on IV antibiotics.   Hospitalist was prolonged because she needed to be monitored for progression of symptoms which may warrant inpatient surgery. Uterine fluid culture grew E. coli, Streptococcus constellatus and bacterial fragilis Evaluated by ID Currently on IV Rocephin and Flagyl  12/26, seen by GYN onc Dr. Pricilla Holm.  Diverticular disease as the possible etiology of Colouterine fistula. Patient is currently asymptomatic, no significant discharge, no fever, WBC count normal. Not recommended for inpatient surgical intervention. For now, recommended to transition to oral antibiotics.  I have switched her to oral Augmentin this morning. If remains clinically stable in next 24 to 48 hours, tentatively plan to discharge home to continue Augmentin total antibiotic course of 28 days (EOT 12/12/22). Need to follow-up with Gyn onc as an  outpatient. Recent Labs  Lab 11/22/23 0415 11/23/23 0623 11/24/23 0520 11/25/23 0644 11/26/23 0521  WBC 13.9* 12.5* 9.2 7.9 8.3   E. coli UTI Urine culture sent admission grew more than 100,000 CFU per mL of E. coli, pansensitive  UTI likely secondary to colovaginal fistula antibiotic as above Antibiotic as above  PAF with RVR - resolved  new onset afib on 12/18; suspect precipitated in setting of infection Improved with metoprolol but it was later stopped because of bradycardia Currently normal sinus rhythm without AV nodal blocking agent Since A-fib was short-lived in the setting of infection, anticoagulation was not considered  Acute anemia Probably due to ongoing infection.  No evidence of blood loss or iron deficiency Currently hemoglobin between 9 and 10.  Continue to monitor Avoid NSAIDs Recent Labs    05/11/23 1605 11/12/23 1302 11/13/23 0407 11/14/23 0539 11/22/23 0415 11/23/23 0623 11/24/23 0520 11/25/23 0644 11/26/23 0521  HGB 13.6   < > 10.4*   < > 10.2* 10.7* 9.6* 9.3* 9.5*  MCV 96.2   < > 94.3   < > 99.1 98.8 99.0 99.0 100.0  VITAMINB12 390  --   --   --   --   --   --   --   --   FERRITIN  --   --  374*  --   --   --   --   --   --   TIBC  --   --  193*  --   --   --   --   --   --   IRON  --   --  34  --   --   --   --   --   --    < > =  values in this interval not displayed.   Essential hypertension PTA meds- Lopressor 25 mg twice daily, hydralazine 25 g 3 times daily Currently not on any meds.  Blood pressure mostly normal range.  Continue monitor   Hyperlipidemia Continue Crestor   Asthma Continue with Dulera twice daily   Mobility: Encourage ambulation  Goals of care   Code Status: Full Code     DVT prophylaxis:  SCDs Start: 11/12/23 2110   Antimicrobials: Switched from IV antibiotics to oral Augmentin today Fluid: None Consultants: Colorectal surgeon, GYN onc, ID Family Communication: None at bedside  Status: Inpatient Level  of care:  Med-Surg   Patient is from: Home Needs to continue in-hospital care: Pending clinical course.  Monitor for next 24 to 48 hours on oral antibiotics Anticipated d/c to: Hopefully home in 1 to 2 days   Diet:  Diet Order             Diet regular Room service appropriate? Yes; Fluid consistency: Thin  Diet effective now                   Scheduled Meds:  amoxicillin-clavulanate  1 tablet Oral Q12H   feeding supplement  237 mL Oral TID BM   latanoprost  1 drop Both Eyes QHS   mometasone-formoterol  2 puff Inhalation BID   rosuvastatin  20 mg Oral Daily    PRN meds: acetaminophen **OR** acetaminophen, albuterol, fentaNYL (SUBLIMAZE) injection, melatonin, naLOXone (NARCAN)  injection, ondansetron (ZOFRAN) IV, mouth rinse, phenol   Infusions:     Antimicrobials: Anti-infectives (From admission, onward)    Start     Dose/Rate Route Frequency Ordered Stop   11/26/23 1000  amoxicillin-clavulanate (AUGMENTIN) 875-125 MG per tablet 1 tablet        1 tablet Oral Every 12 hours 11/26/23 0821     11/19/23 2200  cefTRIAXone (ROCEPHIN) 2 g in sodium chloride 0.9 % 100 mL IVPB  Status:  Discontinued        2 g 200 mL/hr over 30 Minutes Intravenous Every 24 hours 11/19/23 1255 11/26/23 0821   11/19/23 2200  metroNIDAZOLE (FLAGYL) IVPB 500 mg  Status:  Discontinued        500 mg 100 mL/hr over 60 Minutes Intravenous Every 12 hours 11/19/23 1255 11/26/23 0821   11/19/23 1100  amoxicillin-clavulanate (AUGMENTIN) 875-125 MG per tablet 1 tablet  Status:  Discontinued        1 tablet Oral Every 12 hours 11/19/23 1028 11/19/23 1255   11/18/23 2000  cefTRIAXone (ROCEPHIN) 2 g in sodium chloride 0.9 % 100 mL IVPB  Status:  Discontinued        2 g 200 mL/hr over 30 Minutes Intravenous Every 24 hours 11/18/23 1126 11/19/23 1028   11/16/23 2000  metroNIDAZOLE (FLAGYL) IVPB 500 mg  Status:  Discontinued        500 mg 100 mL/hr over 60 Minutes Intravenous Every 12 hours 11/16/23 0846  11/19/23 1028   11/15/23 0815  ceFEPIme (MAXIPIME) 2 g in sodium chloride 0.9 % 100 mL IVPB  Status:  Discontinued        2 g 200 mL/hr over 30 Minutes Intravenous Every 12 hours 11/15/23 0804 11/18/23 1126   11/14/23 1600  metroNIDAZOLE (FLAGYL) tablet 500 mg  Status:  Discontinued        500 mg Oral Every 12 hours 11/14/23 1041 11/14/23 1318   11/14/23 1430  ceFEPIme (MAXIPIME) 2 g in sodium chloride 0.9 % 100 mL IVPB  Status:  Discontinued        2 g 200 mL/hr over 30 Minutes Intravenous Every 24 hours 11/14/23 1335 11/15/23 0804   11/14/23 1430  metroNIDAZOLE (FLAGYL) IVPB 500 mg  Status:  Discontinued        500 mg 100 mL/hr over 60 Minutes Intravenous Every 12 hours 11/14/23 1335 11/16/23 0846   11/13/23 1200  cefTRIAXone (ROCEPHIN) 2 g in sodium chloride 0.9 % 100 mL IVPB  Status:  Discontinued        2 g 200 mL/hr over 30 Minutes Intravenous Every 24 hours 11/12/23 2111 11/14/23 1318   11/13/23 0500  metroNIDAZOLE (FLAGYL) IVPB 500 mg  Status:  Discontinued        500 mg 100 mL/hr over 60 Minutes Intravenous Every 12 hours 11/12/23 2111 11/14/23 1041   11/12/23 1715  metroNIDAZOLE (FLAGYL) IVPB 500 mg        500 mg 100 mL/hr over 60 Minutes Intravenous  Once 11/12/23 1713 11/12/23 1849   11/12/23 1615  cefTRIAXone (ROCEPHIN) 1 g in sodium chloride 0.9 % 100 mL IVPB        1 g 200 mL/hr over 30 Minutes Intravenous  Once 11/12/23 1605 11/12/23 1700       Objective: Vitals:   11/26/23 0851 11/26/23 0903  BP: (!) 124/55   Pulse: 64   Resp:    Temp:    SpO2: 99% 98%    Intake/Output Summary (Last 24 hours) at 11/26/2023 1004 Last data filed at 11/26/2023 0839 Gross per 24 hour  Intake 786.67 ml  Output --  Net 786.67 ml   Filed Weights   11/24/23 0333 11/25/23 0458 11/26/23 0539  Weight: 76.5 kg 76.8 kg 74.9 kg   Weight change: -1.9 kg Body mass index is 33.35 kg/m.   Physical Exam: General exam: Pleasant, elderly African-American female.  Not in  distress Skin: No rashes, lesions or ulcers. HEENT: Atraumatic, normocephalic, no obvious bleeding Lungs: Clear to auscultation bilaterally,  CVS: S1, S2, no murmur,   GI/Abd: Soft, nontender, nondistended, bowel sound present,  CNS: Alert, awake, oriented x 3 Psychiatry: Mood appropriate,  Extremities: No pedal edema, no calf tenderness,   Data Review: I have personally reviewed the laboratory data and studies available.  F/u labs ordered Unresulted Labs (From admission, onward)     Start     Ordered   11/21/23 0500  CBC with Differential/Platelet  Daily,   R     Question:  Specimen collection method  Answer:  Lab=Lab collect   11/20/23 0906            Total time spent in review of labs and imaging, patient evaluation, formulation of plan, documentation and communication with family: 45 minutes  Signed, Lorin Glass, MD [facility] 11/26/2023

## 2023-11-26 NOTE — TOC Progression Note (Addendum)
Transition of Care [facility]) - Progression Note    Patient Details  Name: Heather Grant MRN: [id number] Date of Birth: 1941/01/06  Transition of Care [facility]) CM/SW Contact  Carley Hammed, LCSW Phone Number: 11/26/2023, 1:26 PM  Clinical Narrative:    CSW advised by MD that pt may be ready to DC over the weekend with no surgical intervention needed. Current rec is for SNF, pt states she wants to DC home with dtr's, not a facility. Pt states CSW can speak with her daughters. CSW spoke with Lucinda who noted she would need to speak with her sister, but planning on taking pt to pt's apartment, address confirmed, and she would stay with her there. CM set up [facility] early in admission, confirmed pt has rollator at home. CSW will follow to confirm disposition.  1:35 Dtr called and confirmed plan to take pt home to her address with supports. Dtr's will come transport at DC. Dtr's request an aide be added. CM spoke with [facility], Aide ordered as well. TOC is available for further needs.   3:00 CSW received a call from pt's other dtr [place] who confirmed DC information with CSW. [name] also agreeable to plan. TOC will continue to follow.  Expected Discharge Plan: Home w Home Health Services Barriers to Discharge: Continued Medical Work up  Expected Discharge Plan and Services   Discharge Planning Services: CM Consult Post Acute Care Choice: Home Health Living arrangements for the past 2 months: Apartment                 DME Arranged: N/A DME Agency: NA       HH Arranged: RN, PT HH Agency: [facility] Date HH Agency Contacted: 11/15/23 Time HH Agency Contacted: 1420 Representative spoke with at [facility] Agency: Tresa Endo   Social Determinants of Health (SDOH) Interventions SDOH Screenings   Food Insecurity: No Food Insecurity (11/12/2023)  Housing: Low Risk  (11/12/2023)  Transportation Needs: Unmet Transportation Needs (11/12/2023)  Utilities: Not At Risk (11/12/2023)  Alcohol  Screen: Low Risk  (01/18/2023)  Depression (PHQ2-9): Low Risk  (04/21/2023)  Financial Resource Strain: Low Risk  (01/18/2023)  Physical Activity: Inactive (01/18/2023)  Social Connections: Moderately Isolated (01/18/2023)  Stress: No Stress Concern Present (01/18/2023)  Tobacco Use: Low Risk  (11/25/2023)    Readmission Risk Interventions     No data to display

## 2023-11-27 DIAGNOSIS — N719 Inflammatory disease of uterus, unspecified: Secondary | ICD-10-CM | POA: Diagnosis not present

## 2023-11-27 LAB — CBC WITH DIFFERENTIAL/PLATELET
Abs Immature Granulocytes: 0.1 10*3/uL — ABNORMAL HIGH (ref 0.00–0.07)
Basophils Absolute: 0.2 10*3/uL — ABNORMAL HIGH (ref 0.0–0.1)
Basophils Relative: 2 %
Eosinophils Absolute: 0.2 10*3/uL (ref 0.0–0.5)
Eosinophils Relative: 3 %
HCT: 29.3 % — ABNORMAL LOW (ref 36.0–46.0)
Hemoglobin: 9.3 g/dL — ABNORMAL LOW (ref 12.0–15.0)
Immature Granulocytes: 2 %
Lymphocytes Relative: 46 %
Lymphs Abs: 3.2 10*3/uL (ref 0.7–4.0)
MCH: 31.7 pg (ref 26.0–34.0)
MCHC: 31.7 g/dL (ref 30.0–36.0)
MCV: 100 fL (ref 80.0–100.0)
Monocytes Absolute: 0.7 10*3/uL (ref 0.1–1.0)
Monocytes Relative: 11 %
Neutro Abs: 2.4 10*3/uL (ref 1.7–7.7)
Neutrophils Relative %: 36 %
Platelets: 314 10*3/uL (ref 150–400)
RBC: 2.93 MIL/uL — ABNORMAL LOW (ref 3.87–5.11)
RDW: 15.9 % — ABNORMAL HIGH (ref 11.5–15.5)
WBC: 6.8 10*3/uL (ref 4.0–10.5)
nRBC: 0 % (ref 0.0–0.2)

## 2023-11-27 MED ORDER — OXYCODONE HCL 5 MG PO TABS
5.0000 mg | ORAL_TABLET | Freq: Three times a day (TID) | ORAL | Status: DC | PRN
  Administered 2023-11-27: 5 mg via ORAL
  Filled 2023-11-27 (×2): qty 1

## 2023-11-27 NOTE — Progress Notes (Signed)
Mobility Specialist: Progress Note   11/27/23 1247  Mobility  Activity Transferred to/from [facility]  Level of Assistance Contact guard assist, steadying assist  Assistive Device Front wheel walker  Activity Response Tolerated well  Mobility Referral Yes  Mobility visit 1 Mobility  Mobility Specialist Start Time (ACUTE ONLY) [id number]  Mobility Specialist Stop Time (ACUTE ONLY) [id number]  Mobility Specialist Time Calculation (min) (ACUTE ONLY) 4 min    Pt was agreeable to mobility session - received in bed. Requested to use [facility] - transfer via stand pivot to [facility] with CG and RW. Wanted to sit for awhile. Left on [facility] with all needs met, call bell in reach. NT aware.   Maurene Capes Mobility Specialist Please contact via SecureChat or Rehab office at [phone]

## 2023-11-27 NOTE — Plan of Care (Signed)

## 2023-11-27 NOTE — Progress Notes (Signed)
Occupational Therapy Treatment Patient Details Name: Heather Grant MRN: [id number] DOB: 27-May-1941 Today's Date: 11/27/2023   History of present illness Pt is a 82 y.o. F who presents 11/12/2023 with lower abdominal pain and possible endometriosis as evident on CT of abdomen and pelvis. MRI pelvis showing moderate sigmoid diverticulosis. Underwent cervical dilation on 11/15/23 and receiving IV ABX. Significant PMH: HLD, moderate persistent asthma.   OT comments  Patient making good gains with OT treatment with supervision and increased time to get to EOB. Patient able to ambulate to bathroom with min assist to stand from EOB and CGA. Patient able to stand at sink for grooming tasks with seated rest break before ambulating to recliner. Patient instructed in BUE HEP with yellow therapy band. Patient to continue to be followed by acute OT to address standing ADLs and functional transfers.       If plan is discharge home, recommend the following:  A little help with walking and/or transfers;A little help with bathing/dressing/bathroom;Assistance with cooking/housework   Equipment Recommendations  BSC/3in1    Recommendations for Other Services      Precautions / Restrictions Precautions Precautions: Fall Restrictions Weight Bearing Restrictions Per Provider Order: No       Mobility Bed Mobility Overal bed mobility: Needs Assistance Bed Mobility: Supine to Sit     Supine to sit: Supervision, HOB elevated, Used rails     General bed mobility comments: increased time and use of bed rails    Transfers Overall transfer level: Needs assistance Equipment used: Rolling walker (2 wheels) Transfers: Sit to/from Stand Sit to Stand: Min assist     Step pivot transfers: Contact guard assist     General transfer comment: min assist to power up from EOB and chair with cues for hand placement. CGA once up to RW for transfers     Balance Overall balance assessment: Needs  assistance Sitting-balance support: Feet supported Sitting balance-Leahy Scale: Good Sitting balance - Comments: EOB   Standing balance support: Single extremity supported, Bilateral upper extremity supported, During functional activity Standing balance-Leahy Scale: Poor Standing balance comment: stood at sink for grooming tasks with one extremity support                           ADL either performed or assessed with clinical judgement   ADL Overall ADL's : Needs assistance/impaired     Grooming: Wash/dry hands;Wash/dry face;Oral care;Contact guard assist;Standing Grooming Details (indicate cue type and reason): at sink                 Toilet Transfer: Ambulation;Rolling walker (2 wheels);Minimal assistance Toilet Transfer Details (indicate cue type and reason): Ambulated to bathroom with CGA to [facility]                Extremity/Trunk Assessment              Vision       Perception     Praxis      Cognition Arousal: Alert Behavior During Therapy: WFL for tasks assessed/performed Overall Cognitive Status: Impaired/Different from baseline Area of Impairment: Problem solving, Memory                     Memory: Decreased short-term memory Following Commands: Follows one step commands with increased time     Problem Solving: Slow processing, Requires verbal cues General Comments: cues for safety, following commands with increased time  Exercises Exercises: General Upper Extremity General Exercises - Upper Extremity Shoulder Flexion: Both, 10 reps, Seated, Strengthening, Theraband Theraband Level (Shoulder Flexion): Level 1 (Yellow) Shoulder Horizontal ABduction: Strengthening, Both, 10 reps, Seated, Theraband Theraband Level (Shoulder Horizontal Abduction): Level 1 (Yellow) Elbow Flexion: Strengthening, Both, 10 reps, Seated, Theraband Theraband Level (Elbow Flexion): Level 1 (Yellow) Elbow Extension: Strengthening, Both, 10  reps, Seated, Theraband Theraband Level (Elbow Extension): Level 1 (Yellow)    Shoulder Instructions       General Comments      Pertinent Vitals/ Pain       Pain Assessment Pain Assessment: Faces Faces Pain Scale: Hurts little more Pain Location: B knees R>L (OA) Pain Descriptors / Indicators: Aching, Sore Pain Intervention(s): Limited activity within patient's tolerance, Monitored during session, Premedicated before session, Repositioned  Home Living                                          Prior Functioning/Environment              Frequency  Min 1X/week        Progress Toward Goals  OT Goals(current goals can now be found in the care plan section)  Progress towards OT goals: Progressing toward goals  Acute Rehab OT Goals Patient Stated Goal: get better OT Goal Formulation: With patient Time For Goal Achievement: 12/03/23 Potential to Achieve Goals: Good ADL Goals Pt Will Perform Grooming: with supervision;standing Pt Will Transfer to Toilet: with supervision;ambulating Pt Will Perform Toileting - Clothing Manipulation and hygiene: with supervision;sit to/from stand Pt Will Perform Tub/Shower Transfer: Tub transfer;with supervision;ambulating Pt/caregiver will Perform Home Exercise Program: Increased strength;Both right and left upper extremity;With theraband;With written HEP provided;With Supervision  Plan      Co-evaluation                 AM-PAC OT "6 Clicks" Daily Activity     Outcome Measure   Help from another person eating meals?: None Help from another person taking care of personal grooming?: A Little Help from another person toileting, which includes using toliet, bedpan, or urinal?: A Little Help from another person bathing (including washing, rinsing, drying)?: A Little Help from another person to put on and taking off regular upper body clothing?: A Little Help from another person to put on and taking off regular  lower body clothing?: A Lot 6 Click Score: 18    End of Session Equipment Utilized During Treatment: Gait belt;Rolling walker (2 wheels)  OT Visit Diagnosis: Unsteadiness on feet (R26.81);Other abnormalities of gait and mobility (R26.89)   Activity Tolerance Patient tolerated treatment well   Patient Left in chair;with call bell/phone within reach;with chair alarm set   Nurse Communication Mobility status        Time: 1022-1050 OT Time Calculation (min): 28 min  Charges: OT General Charges $OT Visit: 1 Visit OT Treatments $Self Care/Home Management : 8-22 mins $Therapeutic Exercise: 8-22 mins  Heather Grant, OTA Acute Rehabilitation Services  Office [phone]   Dewain Penning 11/27/2023, 1:56 PM

## 2023-11-27 NOTE — Progress Notes (Signed)
PROGRESS NOTE  Heather Grant  DOB: 05-13-1941  PCP: Myrlene Broker, MD HQI:[id number]  DOA: 11/12/2023  LOS: 15 days  Hospital Day: 16  Brief narrative: Heather Grant is a 82 y.o. female with PMH significant for obesity, OSA, HTN, HLD, asthma 12/13, patient presented to the ED with complaint of lower abdominal discomfort. Underwent CT abdomen pelvis, pelvic ultrasound and MRI pelvis. Results consistent with endometritis and posterior uterine body wall densely adherent to the distal sigmoid colon with suspected 1 cm in length Colo uterine fistula. Started on IV antibiotics Admitted to [facility] OB/GYN was consulted 12/16, underwent cervical dilatation to help uterine contents to spontaneously drain  Subjective: Patient was seen and examined this morning.  Lying down in bed.  Not in distress.  No pain.  No fever.  WBC count remains normal. I called and spoke with patient's daughter Ms. [name] from bedside.  She is making arrangements at home to receive her tomorrow.  Assessment and plan: Endometritis Colo uterine fistula S/p cervical dilatation -12/16 Dr. Su Hilt Since the cervical dilatation on 12/16, patient has remained on IV antibiotics.   Hospital course was prolonged because she needed to be monitored for progression of symptoms which may warrant inpatient surgery. 12/26, seen by GYN onc Dr. Pricilla Holm.  Diverticular disease as the possible etiology of Colouterine fistula. Patient is currently asymptomatic, no significant discharge, no fever, WBC count normal. Not recommended for inpatient surgical intervention. Uterine fluid culture grew E. coli, Streptococcus constellatus and bacterial fragilis Evaluated by ID.  Initially treated with IV Rocephin and IV Flagyl. As recommended by GYN onc, IV antibiotics were switched to oral Augmentin on 12/27.  Patient remains stable on oral antibiotics.  If continues to remain stable, plan to discharge home tomorrow.  Plan is  to continue Augmentin total antibiotic course of 28 days (EOT 12/12/22). Need to follow-up with Gyn onc as an outpatient. Recent Labs  Lab 11/23/23 0623 11/24/23 0520 11/25/23 0644 11/26/23 0521 11/27/23 0555  WBC 12.5* 9.2 7.9 8.3 6.8   E. coli UTI Urine culture sent admission grew more than 100,000 CFU per mL of E. coli, pansensitive  UTI likely secondary to colovaginal fistula antibiotic as above Antibiotic as above  PAF with RVR - resolved  new onset afib on 12/18; suspect precipitated in setting of infection Improved with metoprolol but it was later stopped because of bradycardia Currently normal sinus rhythm without AV nodal blocking agent Since A-fib was short-lived in the setting of infection, anticoagulation was not considered  Acute anemia Probably due to ongoing infection.  No evidence of blood loss or iron deficiency Currently hemoglobin between 9 and 10.  Continue to monitor Avoid NSAIDs Recent Labs    05/11/23 1605 11/12/23 1302 11/13/23 0407 11/14/23 0539 11/23/23 0623 11/24/23 0520 11/25/23 0644 11/26/23 0521 11/27/23 0555  HGB 13.6   < > 10.4*   < > 10.7* 9.6* 9.3* 9.5* 9.3*  MCV 96.2   < > 94.3   < > 98.8 99.0 99.0 100.0 100.0  VITAMINB12 390  --   --   --   --   --   --   --   --   FERRITIN  --   --  374*  --   --   --   --   --   --   TIBC  --   --  193*  --   --   --   --   --   --   IRON  --   --  34  --   --   --   --   --   --    < > = values in this interval not displayed.   Essential hypertension PTA meds- Lopressor 25 mg twice daily, hydralazine 25 g 3 times daily Currently not on any meds.  Blood pressure mostly normal range.  Continue monitor   Hyperlipidemia Continue Crestor   Asthma Continue with Dulera twice daily   Mobility: Encourage ambulation  Goals of care   Code Status: Full Code     DVT prophylaxis:  SCDs Start: 11/12/23 2110   Antimicrobials: Oral Augmentin Fluid: None Consultants: Colorectal surgeon, GYN onc,  ID Family Communication: None at bedside  Status: Inpatient Level of care:  Med-Surg   Patient is from: Home Needs to continue in-hospital care: Pending clinical course.  Hopefully home tomorrow   Diet:  Diet Order             Diet regular Room service appropriate? Yes; Fluid consistency: Thin  Diet effective now                   Scheduled Meds:  amoxicillin-clavulanate  1 tablet Oral Q12H   feeding supplement  237 mL Oral TID BM   latanoprost  1 drop Both Eyes QHS   mometasone-formoterol  2 puff Inhalation BID   rosuvastatin  20 mg Oral Daily    PRN meds: acetaminophen **OR** acetaminophen, albuterol, melatonin, naLOXone (NARCAN)  injection, ondansetron (ZOFRAN) IV, mouth rinse, oxyCODONE, phenol   Infusions:     Antimicrobials: Anti-infectives (From admission, onward)    Start     Dose/Rate Route Frequency Ordered Stop   11/26/23 1000  amoxicillin-clavulanate (AUGMENTIN) 875-125 MG per tablet 1 tablet        1 tablet Oral Every 12 hours 11/26/23 0821     11/19/23 2200  cefTRIAXone (ROCEPHIN) 2 g in sodium chloride 0.9 % 100 mL IVPB  Status:  Discontinued        2 g 200 mL/hr over 30 Minutes Intravenous Every 24 hours 11/19/23 1255 11/26/23 0821   11/19/23 2200  metroNIDAZOLE (FLAGYL) IVPB 500 mg  Status:  Discontinued        500 mg 100 mL/hr over 60 Minutes Intravenous Every 12 hours 11/19/23 1255 11/26/23 0821   11/19/23 1100  amoxicillin-clavulanate (AUGMENTIN) 875-125 MG per tablet 1 tablet  Status:  Discontinued        1 tablet Oral Every 12 hours 11/19/23 1028 11/19/23 1255   11/18/23 2000  cefTRIAXone (ROCEPHIN) 2 g in sodium chloride 0.9 % 100 mL IVPB  Status:  Discontinued        2 g 200 mL/hr over 30 Minutes Intravenous Every 24 hours 11/18/23 1126 11/19/23 1028   11/16/23 2000  metroNIDAZOLE (FLAGYL) IVPB 500 mg  Status:  Discontinued        500 mg 100 mL/hr over 60 Minutes Intravenous Every 12 hours 11/16/23 0846 11/19/23 1028   11/15/23 0815   ceFEPIme (MAXIPIME) 2 g in sodium chloride 0.9 % 100 mL IVPB  Status:  Discontinued        2 g 200 mL/hr over 30 Minutes Intravenous Every 12 hours 11/15/23 0804 11/18/23 1126   11/14/23 1600  metroNIDAZOLE (FLAGYL) tablet 500 mg  Status:  Discontinued        500 mg Oral Every 12 hours 11/14/23 1041 11/14/23 1318   11/14/23 1430  ceFEPIme (MAXIPIME) 2 g in sodium chloride 0.9 % 100 mL IVPB  Status:  Discontinued        2 g 200 mL/hr over 30 Minutes Intravenous Every 24 hours 11/14/23 1335 11/15/23 0804   11/14/23 1430  metroNIDAZOLE (FLAGYL) IVPB 500 mg  Status:  Discontinued        500 mg 100 mL/hr over 60 Minutes Intravenous Every 12 hours 11/14/23 1335 11/16/23 0846   11/13/23 1200  cefTRIAXone (ROCEPHIN) 2 g in sodium chloride 0.9 % 100 mL IVPB  Status:  Discontinued        2 g 200 mL/hr over 30 Minutes Intravenous Every 24 hours 11/12/23 2111 11/14/23 1318   11/13/23 0500  metroNIDAZOLE (FLAGYL) IVPB 500 mg  Status:  Discontinued        500 mg 100 mL/hr over 60 Minutes Intravenous Every 12 hours 11/12/23 2111 11/14/23 1041   11/12/23 1715  metroNIDAZOLE (FLAGYL) IVPB 500 mg        500 mg 100 mL/hr over 60 Minutes Intravenous  Once 11/12/23 1713 11/12/23 1849   11/12/23 1615  cefTRIAXone (ROCEPHIN) 1 g in sodium chloride 0.9 % 100 mL IVPB        1 g 200 mL/hr over 30 Minutes Intravenous  Once 11/12/23 1605 11/12/23 1700       Objective: Vitals:   11/27/23 0328 11/27/23 0750  BP: (!) 140/60 (!) 123/50  Pulse: (!) 57 (!) 53  Resp:  17  Temp: 97.9 F (36.6 C) 97.8 F (36.6 C)  SpO2: 99% 98%    Intake/Output Summary (Last 24 hours) at 11/27/2023 1006 Last data filed at 11/27/2023 0750 Gross per 24 hour  Intake 300 ml  Output --  Net 300 ml   Filed Weights   11/25/23 0458 11/26/23 0539 11/27/23 0447  Weight: 76.8 kg 74.9 kg 73.5 kg   Weight change: -1.4 kg Body mass index is 32.73 kg/m.   Physical Exam: General exam: Pleasant, elderly African-American female.   Not in distress Skin: No rashes, lesions or ulcers. HEENT: Atraumatic, normocephalic, no obvious bleeding Lungs: Clear to auscultation bilaterally,  CVS: S1, S2, no murmur,   GI/Abd: Soft, nontender, nondistended, bowel sound present,  CNS: Alert, awake, oriented x 3 Psychiatry: Mood appropriate,  Extremities: No pedal edema, no calf tenderness,   Data Review: I have personally reviewed the laboratory data and studies available.  F/u labs ordered Unresulted Labs (From admission, onward)    None       Total time spent in review of labs and imaging, patient evaluation, formulation of plan, documentation and communication with family: 45 minutes  Signed, Lorin Glass, MD [facility] 11/27/2023

## 2023-11-27 NOTE — Progress Notes (Signed)
12 Days Post-Op Procedure(s): Cervical Dilation  Subjective: Patient reports tolerating PO.    Objective: BP (!) 123/50 (BP Location: Right Arm)   Pulse (!) 53   Temp 97.8 F (36.6 C) (Oral)   Resp 17   Ht 4\' 11"  (1.499 m)   Wt 73.5 kg   SpO2 98%   BMI 32.73 kg/m   General: alert and cooperative GI: soft, non-tender; bowel sounds normal; no masses,  no organomegaly Extremities: Homans sign is negative, no sign of DVT Vaginal Bleeding: minimal  Assessment: s/p Procedure(s): Cervical Dilation: stable  Plan: FU with GYN ONC and colo rectal surgery for eval as an out pt  FU with Dr Sallye Ober as scheduled Pt is stable and hope to go home tomorrow.   LOS: 15 days    Adalyna Godbee A Bassam Dresch 11/27/2023, 1:55 PM

## 2023-11-28 DIAGNOSIS — N719 Inflammatory disease of uterus, unspecified: Secondary | ICD-10-CM | POA: Diagnosis not present

## 2023-11-28 MED ORDER — ACETAMINOPHEN 325 MG PO TABS: 650.0000 mg | ORAL_TABLET | Freq: Four times a day (QID) | ORAL | Status: AC | PRN

## 2023-11-28 MED ORDER — OXYCODONE HCL 5 MG PO TABS: 5.0000 mg | ORAL_TABLET | Freq: Three times a day (TID) | ORAL | 0 refills | Status: AC | PRN

## 2023-11-28 MED ORDER — LACTINEX PO CHEW: 1.0000 | CHEWABLE_TABLET | Freq: Three times a day (TID) | ORAL | 0 refills | Status: AC

## 2023-11-28 MED ORDER — AMOXICILLIN-POT CLAVULANATE 875-125 MG PO TABS: 1.0000 | ORAL_TABLET | Freq: Two times a day (BID) | ORAL | 0 refills | Status: AC

## 2023-11-28 NOTE — TOC Transition Note (Addendum)
Transition of Care [facility]) - Discharge Note   Patient Details  Name: Heather Grant MRN: [id number] Date of Birth: 07/04/1941  [facility] CM/SW Contact:  Ronny Bacon, RN Phone Number: 11/28/2023, 9:33 AM   Clinical Narrative:   Secure chat from floor nurse regarding patient requesting RW and BSC. Spoke with Mittie Bodo), per notes patient has rollator at home, will see if patient eligible to get RW.   1004: Katina with [facility] aware of patient being discharged today.    Final next level of care: Home w Home Health Services Barriers to Discharge: Continued Medical Work up   Patient Goals and CMS Choice Patient states their goals for this hospitalization and ongoing recovery are:: to return to home CMS Medicare.gov Compare Post Acute Care list provided to:: Patient Choice offered to / list presented to : Patient      Discharge Placement                       Discharge Plan and Services Additional resources added to the After Visit Summary for     Discharge Planning Services: CM Consult Post Acute Care Choice: Home Health          DME Arranged: Walker rolling, Bedside commode DME Agency: [employer] Date DME Agency Contacted: 11/28/23 Time DME Agency Contacted: 0930 Representative spoke with at DME Agency: Vaughan Basta HH Arranged: RN, PT HH Agency: [facility] Date [facility] Agency Contacted: 11/15/23 Time HH Agency Contacted: 1420 Representative spoke with at [facility] Agency: Tresa Endo  Social Drivers of Health (SDOH) Interventions SDOH Screenings   Food Insecurity: No Food Insecurity (11/12/2023)  Housing: Low Risk  (11/12/2023)  Transportation Needs: Unmet Transportation Needs (11/12/2023)  Utilities: Not At Risk (11/12/2023)  Alcohol Screen: Low Risk  (01/18/2023)  Depression (PHQ2-9): Low Risk  (04/21/2023)  Financial Resource Strain: Low Risk  (01/18/2023)  Physical Activity: Inactive (01/18/2023)  Social Connections:  Moderately Isolated (01/18/2023)  Stress: No Stress Concern Present (01/18/2023)  Tobacco Use: Low Risk  (11/25/2023)     Readmission Risk Interventions     No data to display

## 2023-11-28 NOTE — Discharge Summary (Signed)
Physician Discharge Summary  Heather Grant:[id number] DOB: 1941-01-18 DOA: 11/12/2023  PCP: Myrlene Broker, MD  Admit date: 11/12/2023 Discharge date: 11/28/2023  Admitted From: Home Discharge disposition: Home with home health services  Recommendations at discharge:  Complete the course of antibiotics with oral Augmentin and probiotics till 12/12/2022 Follow-up with GYN onc as an outpatient. Cautious use of pain medicines as needed. Your blood pressure has remained in normal range without regular pills-metoprolol and hydralazine.  Continue to monitor blood pressure at home.  If systolic blood pressure is over 140, resume metoprolol.  Follow-up with PCP as an outpatient for further adjustment.   Brief narrative: Heather Grant is a 82 y.o. female with PMH significant for obesity, OSA, HTN, HLD, asthma 12/13, patient presented to the ED with complaint of lower abdominal discomfort. Underwent CT abdomen pelvis, pelvic ultrasound and MRI pelvis. Results consistent with endometritis and posterior uterine body wall densely adherent to the distal sigmoid colon with suspected 1 cm in length Colo uterine fistula. Started on IV antibiotics Admitted to [facility] OB[facility]GYN was consulted 12/16, underwent cervical dilatation to help uterine contents to spontaneously drain  Subjective: Patient was seen and examined this morning.  Lying on bed.  Not in distress. Excited about going home.  Family not at bedside.  Yesterday morning, I called and spoke with patient's daughter Ms. [name] from bedside.   Hospital course: Endometritis Colo uterine fistula S/p cervical dilatation -12/16 Dr. Su Hilt Presented with lower abdominal discomfort.  Imaging showed endometritis and possible Colo uterine fistula.   Seen by GYN, Gyn Onc and colorectal surgery and ID. In order to help her uterine contents spontaneously drain, patient underwent cervical dilatation on 12/16 Uterine fluid culture  grew E. coli, Streptococcus constellatus and bacterial fragilis Patient was kept on broad-spectrum antibiotics per ID recommendation  Hospital course was prolonged because she needed to be monitored for progression of symptoms which may warrant inpatient surgery. 12/26, seen by GYN onc Dr. Pricilla Holm.  Diverticular disease as the possible etiology of Colouterine fistula. Patient is currently asymptomatic, no significant discharge, no fever, WBC count normal.  As recommended by GYN onc, IV antibiotics were switched to oral Augmentin on 12/27.  Patient remains stable on oral antibiotics.  Stable to discharge home today. Plan is to continue Augmentin total antibiotic course of 28 days (EOT 12/12/22). Need to follow-up with Gyn onc as an outpatient. Recent Labs  Lab 11/23/23 0623 11/24/23 0520 11/25/23 0644 11/26/23 0521 11/27/23 0555  WBC 12.5* 9.2 7.9 8.3 6.8   E. coli UTI Urine culture sent admission grew more than 100,000 CFU per mL of E. coli, pansensitive  UTI likely secondary to colovaginal fistula antibiotic as above Antibiotic as above  PAF with RVR - resolved  new onset afib on 12/18; suspect precipitated in setting of infection Improved with metoprolol but it was later stopped because of bradycardia Currently normal sinus rhythm without AV nodal blocking agent Since A-fib was short-lived in the setting of infection, anticoagulation was not considered  Acute anemia Probably due to ongoing infection.  No evidence of blood loss or iron deficiency Currently hemoglobin between 9 and 10.  Continue to monitor Avoid NSAIDs Recent Labs    05/11/23 1605 11/12/23 1302 11/13/23 0407 11/14/23 0539 11/23/23 0623 11/24/23 0520 11/25/23 0644 11/26/23 0521 11/27/23 0555  HGB 13.6   < > 10.4*   < > 10.7* 9.6* 9.3* 9.5* 9.3*  MCV 96.2   < > 94.3   < > 98.8 99.0  99.0 100.0 100.0  VITAMINB12 390  --   --   --   --   --   --   --   --   FERRITIN  --   --  374*  --   --   --   --   --   --    TIBC  --   --  193*  --   --   --   --   --   --   IRON  --   --  34  --   --   --   --   --   --    < > = values in this interval not displayed.   Essential hypertension PTA meds- Lopressor 25 mg twice daily, hydralazine 25 g 3 times daily Currently not on any meds.  Blood pressure mostly normal range.  Continue monitor   Hyperlipidemia Continue Crestor   Asthma Continue with Dulera twice daily   Goals of care   Code Status: Full Code   Diet:  Diet Order             Diet general           Diet regular Room service appropriate? Yes; Fluid consistency: Thin  Diet effective now                   Nutritional status:  Body mass index is 32.99 kg/m.       Wounds:  -    Discharge Exam:   Vitals:   11/28/23 0335 11/28/23 0416 11/28/23 0819 11/28/23 0849  BP: (!) 126/51  133/73   Pulse: 73  (!) 57   Resp: 16  16 16   Temp: 98 F (36.7 C)  98.2 F (36.8 C)   TempSrc:   Oral   SpO2: 99%  100% 99%  Weight:  74.1 kg    Height:        Body mass index is 32.99 kg/m.  General exam: Pleasant, elderly African-American female.  Not in distress Skin: No rashes, lesions or ulcers. HEENT: Atraumatic, normocephalic, no obvious bleeding Lungs: Clear to auscultation bilaterally,  CVS: S1, S2, no murmur,   GI/Abd: Soft, nontender, nondistended, bowel sound present,  CNS: Alert, awake, oriented x 3 Psychiatry: Mood appropriate,  Extremities: No pedal edema, no calf tenderness,   Follow ups:    Follow-up Information     Medicaid Transportation Follow up.   Contact information: phone [phone]        [facility] Follow up.   Specialty: Home Health Services Contact information: [address] [place] [address] [phone]         [facility] [place] Follow up.   Specialty: General Surgery Why: We are working to schedule a visit with colorectal surgery for discussion of fistula repair. Please call to confirm  appointment time., Arrive 30 minutes early to complete check in, and bring photo ID and insurance card. Contact information: [address] [place] [address] [phone]        Carver Fila, MD Follow up on 12/17/2023.   Specialty: Gynecologic Oncology Why: at 8:30 am at the [facility] to [facility]. Please arrive at 8 am to begin the check in process. There is valet parking free of charge at the front of the [employer]. Your consultation with Dr. Pricilla Holm will include pelvic examination. Contact information: [address]  [place] [address] [phone]                 Discharge Instructions:   Discharge Instructions     Call MD for:  difficulty breathing, headache or visual disturbances   Complete by: As directed    Call MD for:  extreme fatigue   Complete by: As directed    Call MD for:  hives   Complete by: As directed    Call MD for:  persistant dizziness or light-headedness   Complete by: As directed    Call MD for:  persistant nausea and vomiting   Complete by: As directed    Call MD for:  severe uncontrolled pain   Complete by: As directed    Call MD for:  temperature >100.4   Complete by: As directed    Diet general   Complete by: As directed    Discharge instructions   Complete by: As directed    Recommendations at discharge:   Complete the course of antibiotics with oral Augmentin and probiotics till 12/12/2022  Follow-up with GYN onc as an outpatient.  Cautious use of pain medicines as needed.  Your blood pressure has remained in normal range without regular pills-metoprolol and hydralazine.  Continue to monitor blood pressure at home.  If systolic blood pressure is over 140, resume metoprolol.  Follow-up with PCP as an outpatient for further adjustment.  General discharge instructions: Follow with Primary MD Myrlene Broker, MD in 7 days  Please request your PCP  to  go over your hospital tests, procedures, radiology results at the follow up. Please get your medicines reviewed and adjusted.  Your PCP may decide to repeat certain labs or tests as needed. Do not drive, operate heavy machinery, perform activities at heights, swimming or participation in water activities or provide baby sitting services if your were admitted for syncope or siezures until you have seen by Primary MD or a Neurologist and advised to do so again. [place] Controlled Substance Reporting System database was reviewed. Do not drive, operate heavy machinery, perform activities at heights, swim, participate in water activities or provide baby-sitting services while on medications for pain, sleep and mood until your outpatient physician has reevaluated you and advised to do so again.  You are strongly recommended to comply with the dose, frequency and duration of prescribed medications. Activity: As tolerated with Full fall precautions use walker/cane & assistance as needed Avoid using any recreational substances like cigarette, tobacco, alcohol, or non-prescribed drug. If you experience worsening of your admission symptoms, develop shortness of breath, life threatening emergency, suicidal or homicidal thoughts you must seek medical attention immediately by calling 911 or calling your MD immediately  if symptoms less severe. You must read complete instructions/literature along with all the possible adverse reactions/side effects for all the medicines you take and that have been prescribed to you. Take any new medicine only after you have completely understood and accepted all the possible adverse reactions/side effects.  Wear Seat belts while driving. You were cared for by a hospitalist during your hospital stay. If you have any questions about your discharge medications or the care you received while you were in the hospital after you are discharged, you can call the unit and ask to speak with  the hospitalist or the covering physician. Once you are discharged, your primary care physician will handle any further medical issues. Please note that NO REFILLS for any discharge medications will be authorized once you are discharged,  as it is imperative that you return to your primary care physician (or establish a relationship with a primary care physician if you do not have one).   PDMP reviewed this encounter.   Opioid taper instructions: It is important to wean off of your opioid medication as soon as possible. If you do not need pain medication after your surgery it is ok to stop day one. Opioids include: Codeine, Hydrocodone(Norco, Vicodin), Oxycodone(Percocet, oxycontin) and hydromorphone amongst others.  Long term and even short term use of opiods can cause: Increased pain response Dependence Constipation Depression Respiratory depression And more.  Withdrawal symptoms can include Flu like symptoms Nausea, vomiting And more Techniques to manage these symptoms Hydrate well Eat regular healthy meals Stay active Use relaxation techniques(deep breathing, meditating, yoga) Do Not substitute Alcohol to help with tapering If you have been on opioids for less than two weeks and do not have pain than it is ok to stop all together.  Plan to wean off of opioids This plan should start within one week post op of your joint replacement. Maintain the same interval or time between taking each dose and first decrease the dose.  Cut the total daily intake of opioids by one tablet each day Next start to increase the time between doses. The last dose that should be eliminated is the evening dose.   Increase activity slowly   Complete by: As directed        Discharge Medications:   Allergies as of 11/28/2023   No Known Allergies      Medication List     PAUSE taking these medications    hydrALAZINE 25 MG tablet Wait to take this until your doctor or other care provider  tells you to start again. Commonly known as: APRESOLINE TAKE 1 TABLET BY MOUTH 3 TIMES  DAILY   metoprolol tartrate 25 MG tablet Wait to take this until your doctor or other care provider tells you to start again. Commonly known as: LOPRESSOR TAKE 1 TABLET BY MOUTH TWICE  DAILY       STOP taking these medications    meloxicam 15 MG tablet Commonly known as: MOBIC       TAKE these medications    acetaminophen 325 MG tablet Commonly known as: TYLENOL Take 2 tablets (650 mg total) by mouth every 6 (six) hours as needed for mild pain (pain score 1-3) (or Fever >/= 101).   albuterol 108 (90 Base) MCG/ACT inhaler Commonly known as: VENTOLIN HFA USE 1 TO 2 INHALATIONS BY MOUTH  EVERY 6 HOURS AS NEEDED FOR  WHEEZING OR SHORTNESS OF BREATH   amoxicillin-clavulanate 875-125 MG tablet Commonly known as: AUGMENTIN Take 1 tablet by mouth every 12 (twelve) hours for 15 days.   Cholecalciferol 50 MCG (2000 UT) Caps Vitamin D3 50 mcg (2,000 unit) capsule  Take 1 capsule every day by oral route as directed.   colchicine 0.6 MG tablet TAKE 1 TABLET BY MOUTH DAILY FOR GOUT FLARE   Dulera 100-5 MCG/ACT Aero Generic drug: mometasone-formoterol USE 2 INHALATIONS BY MOUTH TWICE DAILY   famotidine 20 MG tablet Commonly known as: PEPCID TAKE 1 TABLET BY MOUTH DAILY AT  BEDTIME   lactobacillus acidophilus & bulgar chewable tablet Chew 1 tablet by mouth 3 (three) times daily with meals for 15 days.   LUBRICANT DROPS OP Place 1 drop into both eyes daily.   Lumigan 0.01 % Soln Generic drug: bimatoprost Place 1 drop into both eyes at bedtime.   nystatin  cream Commonly known as: MYCOSTATIN APPLY TO AFFECTED AREA TWICE A DAY   oxyCODONE 5 MG immediate release tablet Commonly known as: Oxy IR/ROXICODONE Take 1 tablet (5 mg total) by mouth every 8 (eight) hours as needed for up to 5 days for moderate pain (pain score 4-6).   OXYGEN Inhale 2 L into the lungs at bedtime.    pantoprazole 40 MG tablet Commonly known as: PROTONIX TAKE 1 TABLET BY MOUTH DAILY 30  TO 60 MINUTES BEFORE FIRST MEAL  OF THE DAY   rosuvastatin 20 MG tablet Commonly known as: CRESTOR TAKE 1 TABLET BY MOUTH DAILY   Voltaren 1 % Gel Generic drug: diclofenac Sodium Apply 2 g topically daily as needed (knee pain).         The results of significant diagnostics from this hospitalization (including imaging, microbiology, ancillary and laboratory) are listed below for reference.    Procedures and Diagnostic Studies:   MR PELVIS W WO CONTRAST Result Date: 11/13/2023 CLINICAL DATA:  Inpatient. Concern for colo-uterine fistula on recent CT. EXAM: MRI PELVIS WITHOUT AND WITH CONTRAST TECHNIQUE: Multiplanar multisequence MR imaging of the pelvis was performed both before and after administration of intravenous contrast. CONTRAST:  8mL GADAVIST GADOBUTROL 1 MMOL/ML IV SOLN COMPARISON:  11/12/2023 pelvic ultrasound and CT abdomen/pelvis. FINDINGS: Urinary Tract:  Collapsed normal bladder.  Normal urethra. Bowel: Moderate sigmoid diverticulosis. 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 No significant bowel wall thickening or acute pericolonic inflammatory changes. Vascular/Lymphatic: No pathologically enlarged lymph nodes in the pelvis. No acute vascular abnormality. Reproductive: Uterus: The enlarged anteverted uterus measures 11.7 x 8.1 x 9.0 cm. No uterine fibroids. The uterine cavity is markedly distended (6.6 cm AP diameter) by heterogeneous fluid with layering air anteriorly. Normal uterine cervix with intact cervical fibrous stroma. Ovaries and Adnexa: The right ovary measures 1.5 x 1.2 x 1.0 cm and is unremarkable, with a subcentimeter simple cyst. The left ovary measures 1.5 x 1.0 x 0.9 cm  and is normal. There are no suspicious ovarian or adnexal masses. Other: Trace simple free fluid in the pelvic cul-de-sac. No focal extra uterine fluid collection. Musculoskeletal: No aggressive appearing focal osseous lesions. Marked lower lumbar degenerative disc disease. IMPRESSION: 1. Moderate sigmoid diverticulosis. 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 2. Markedly distended uterine cavity by heterogeneous fluid with layering air anteriorly, compatible with endometritis/uterine cavity abscess. 3. Normal ovaries. 4. Trace simple free fluid in the pelvic cul-de-sac. Electronically Signed   By: Delbert Phenix M.D.   On: 11/13/2023 17:05   US PELVIC COMPLETE WITH TRANSVAGINAL Result Date: 11/12/2023 CLINICAL DATA:  Pelvic pain EXAM: TRANSABDOMINAL AND TRANSVAGINAL ULTRASOUND OF PELVIS TECHNIQUE: Both transabdominal and transvaginal ultrasound examinations of the pelvis were performed. Transabdominal technique was performed for global imaging of the pelvis including uterus, ovaries, adnexal regions, and pelvic cul-de-sac. It was necessary to proceed with endovaginal exam following the transabdominal exam to visualize the adnexa. COMPARISON:  CT 11/12/2023 FINDINGS: Uterus Vaginal cuff grossly unremarkable. Poorly visible uterus due to dirty shadowing from air. On endovaginal images and cine clips, large gas and fluid collection at the midline pelvis presumably corresponding to the gas and fluid collections seen on CT. Endometrium Not visualized,  unable to measure. Right ovary Not seen. Left ovary Not seen Other findings No abnormal free fluid. IMPRESSION: Poorly visible uterus due to dirty shadowing from air. On endovaginal images and cine clips, large gas and fluid collection at the midline pelvis presumably corresponding to the gas and fluid collection on CT which appears to be  intrauterine in location, concerning for intrauterine abscess. CT demonstrated potential colo uterine fistula as well. Nonvisualized ovaries Electronically Signed   By: Jasmine Pang M.D.   On: 11/12/2023 23:45   CT ABDOMEN PELVIS W CONTRAST Result Date: 11/12/2023 CLINICAL DATA:  Right lower quadrant abdominal pain EXAM: CT ABDOMEN AND PELVIS WITH CONTRAST TECHNIQUE: Multidetector CT imaging of the abdomen and pelvis was performed using the standard protocol following bolus administration of intravenous contrast. RADIATION DOSE REDUCTION: This exam was performed according to the departmental dose-optimization program which includes automated exposure control, adjustment of the mA and/or kV according to patient size and/or use of iterative reconstruction technique. CONTRAST:  OMNIPAQUE IOHEXOL 300 MG/ML  SOLN COMPARISON:  None Available. FINDINGS: Lower chest: No acute abnormality. Hepatobiliary: Unremarkable liver. Normal gallbladder. No biliary dilation. Pancreas: Unremarkable. Spleen: Unremarkable. Adrenals/Urinary Tract: Normal adrenal glands. Low-attenuation lesions in the kidneys are statistically likely to represent cysts. No follow-up is required. No urinary calculi or hydronephrosis. Bladder is unremarkable. Stomach/Bowel: Normal caliber large and small bowel. Colonic diverticulosis without diverticulitis. No bowel wall thickening. The appendix is normal.Stomach is within normal limits. Vascular/Lymphatic: No significant vascular findings are present. No enlarged abdominal or pelvic lymph nodes. Reproductive: Frothy debris with air-fluid level within the dilated uterus. Mild fat stranding anterior to the uterus. Ovaries are unremarkable. Other: Question free intraperitoneal air and trace fluid between the posterior right uterus and the sigmoid colon (circa series 6/image 56-61). Musculoskeletal: No acute fracture. Multilevel thoracolumbar spondylosis IMPRESSION: 1. Frothy debris with air-fluid  level within the uterus concerning for endometritis. Pelvic ultrasound and gynecology consult is recommended. 2. Question colo-uterine fistula between the sigmoid colon and posterior right uterus. These results were called by telephone at the time of interpretation on 11/12/2023 at 5:13 pm to provider Dr. Karene Fry, Who verbally acknowledged these results. Electronically Signed   By: Minerva Fester M.D.   On: 11/12/2023 17:14     Labs:   Basic Metabolic Panel: Recent Labs  Lab 11/24/23 0520  NA 143  K 3.7  CL 109  CO2 27  GLUCOSE 126*  BUN 10  CREATININE 0.96  CALCIUM 9.1   GFR Estimated Creatinine Clearance: 41 mL/min (by C-G formula based on SCr of 0.96 mg/dL). Liver Function Tests: No results for input(s): "AST", "ALT", "ALKPHOS", "BILITOT", "PROT", "ALBUMIN" in the last 168 hours. No results for input(s): "LIPASE", "AMYLASE" in the last 168 hours. No results for input(s): "AMMONIA" in the last 168 hours. Coagulation profile No results for input(s): "INR", "PROTIME" in the last 168 hours.  CBC: Recent Labs  Lab 11/23/23 0623 11/24/23 0520 11/25/23 0644 11/26/23 0521 11/27/23 0555  WBC 12.5* 9.2 7.9 8.3 6.8  NEUTROABS 5.6 5.6 3.6 3.7 2.4  HGB 10.7* 9.6* 9.3* 9.5* 9.3*  HCT 34.3* 30.3* 29.7* 30.8* 29.3*  MCV 98.8 99.0 99.0 100.0 100.0  PLT 437* 375 329 331 314   Cardiac Enzymes: No results for input(s): "CKTOTAL", "CKMB", "CKMBINDEX", "TROPONINI" in the last 168 hours. BNP: Invalid input(s): "POCBNP" CBG: No results for input(s): "GLUCAP" in the last 168 hours. D-Dimer No results for input(s): "DDIMER" in the last 72 hours. Hgb A1c No results for input(s): "  HGBA1C" in the last 72 hours. Lipid Profile No results for input(s): "CHOL", "HDL", "LDLCALC", "TRIG", "CHOLHDL", "LDLDIRECT" in the last 72 hours. Thyroid function studies No results for input(s): "TSH", "T4TOTAL", "T3FREE", "THYROIDAB" in the last 72 hours.  Invalid input(s): "FREET3" Anemia work up No  results for input(s): "VITAMINB12", "FOLATE", "FERRITIN", "TIBC", "IRON", "RETICCTPCT" in the last 72 hours. Microbiology Recent Results (from the past 240 hours)  Surgical PCR screen     Status: None   Collection Time: 11/20/23  2:32 AM   Specimen: Nasal Mucosa; Nasal Swab  Result Value Ref Range Status   MRSA, PCR NEGATIVE NEGATIVE Final   Staphylococcus aureus NEGATIVE NEGATIVE Final    Comment: (NOTE) The Xpert SA Assay (FDA approved for NASAL specimens in patients 46 years of age and older), is one component of a comprehensive surveillance program. It is not intended to diagnose infection nor to guide or monitor treatment. Performed at [facility], [address] [place], [place] [address]     Time coordinating discharge: 45 minutes  Signed: Mckenzie Bove  [facility] 11/28/2023, 9:31 AM

## 2023-11-28 NOTE — Progress Notes (Signed)
Ccmd notified of dc order. Patient and daughter Heather Grant verbalized understanding of dc intructions. All questions answered by this RN and primary RN Chancie. All belongings and DME also provided to patient upon discharge.

## 2023-11-29 ENCOUNTER — Emergency Department (HOSPITAL_COMMUNITY): Payer: 59

## 2023-11-29 ENCOUNTER — Emergency Department (HOSPITAL_COMMUNITY)
Admission: EM | Admit: 2023-11-29 | Discharge: 2023-11-29 | Disposition: A | Discharge status: Home / Self Care | Payer: 59 | Attending: Emergency Medicine | Admitting: Emergency Medicine

## 2023-11-29 ENCOUNTER — Telehealth: Payer: Self-pay | Admitting: *Deleted

## 2023-11-29 ENCOUNTER — Encounter (HOSPITAL_COMMUNITY): Payer: Self-pay

## 2023-11-29 ENCOUNTER — Other Ambulatory Visit: Payer: Self-pay

## 2023-11-29 DIAGNOSIS — M25461 Effusion, right knee: Secondary | ICD-10-CM | POA: Diagnosis not present

## 2023-11-29 DIAGNOSIS — J45909 Unspecified asthma, uncomplicated: Secondary | ICD-10-CM | POA: Insufficient documentation

## 2023-11-29 DIAGNOSIS — Z743 Need for continuous supervision: Secondary | ICD-10-CM | POA: Diagnosis not present

## 2023-11-29 DIAGNOSIS — M79673 Pain in unspecified foot: Secondary | ICD-10-CM | POA: Diagnosis not present

## 2023-11-29 DIAGNOSIS — M1009 Idiopathic gout, multiple sites: Secondary | ICD-10-CM | POA: Insufficient documentation

## 2023-11-29 DIAGNOSIS — M1711 Unilateral primary osteoarthritis, right knee: Secondary | ICD-10-CM | POA: Diagnosis not present

## 2023-11-29 DIAGNOSIS — M7731 Calcaneal spur, right foot: Secondary | ICD-10-CM | POA: Diagnosis not present

## 2023-11-29 DIAGNOSIS — I10 Essential (primary) hypertension: Secondary | ICD-10-CM | POA: Insufficient documentation

## 2023-11-29 DIAGNOSIS — Z79899 Other long term (current) drug therapy: Secondary | ICD-10-CM | POA: Diagnosis not present

## 2023-11-29 DIAGNOSIS — M109 Gout, unspecified: Secondary | ICD-10-CM | POA: Diagnosis not present

## 2023-11-29 DIAGNOSIS — M25571 Pain in right ankle and joints of right foot: Secondary | ICD-10-CM | POA: Diagnosis not present

## 2023-11-29 DIAGNOSIS — M25561 Pain in right knee: Secondary | ICD-10-CM | POA: Diagnosis not present

## 2023-11-29 MED ORDER — PREDNISONE 10 MG (21) PO TBPK: ORAL_TABLET | Freq: Every day | ORAL | 0 refills | Status: DC

## 2023-11-29 MED ORDER — OXYCODONE-ACETAMINOPHEN 5-325 MG PO TABS
1.0000 | ORAL_TABLET | Freq: Once | ORAL | Status: AC
  Administered 2023-11-29: 1 via ORAL
  Filled 2023-11-29: qty 1

## 2023-11-29 MED ORDER — COLCHICINE 0.6 MG PO TABS
0.6000 mg | ORAL_TABLET | Freq: Once | ORAL | Status: AC
  Administered 2023-11-29: 0.6 mg via ORAL
  Filled 2023-11-29: qty 1

## 2023-11-29 NOTE — Transitions of Care (Post Inpatient/ED Visit) (Signed)
   11/29/2023  Name: Heather Grant MRN: [id number] DOB: 1941/02/23  Today's TOC FU Call Status: Today's TOC FU Call Status:: Unsuccessful Call (1st Attempt) Unsuccessful Call (1st Attempt) Date: 11/29/23  Attempted to reach the patient regarding the most recent Inpatient visit; left HIPAA compliant voice message requesting call back  Follow Up Plan: Additional outreach attempts will be made to reach the patient to complete the Transitions of Care (Post Inpatient visit) call.   Caryl Pina, RN, BSN, Media planner  Transitions of Care  [facility] [phone]: direct office

## 2023-11-29 NOTE — Discharge Instructions (Addendum)
Please use Tylenol for pain.  You may use 1000 mg of Tylenol every 6 hours.  Not to exceed 4 g of Tylenol within 24 hours.  Please take the entire course of steroids that I prescribed, take some time to rest the affected extremity, and return to walking as tolerated and 1 to 2 days.

## 2023-11-29 NOTE — ED Triage Notes (Signed)
Pt arrived from [place] via [place] c/o RLE pain, both ankle and knee. Right ankle noted to be swollen. 10/10 on pain scale

## 2023-11-29 NOTE — ED Provider Notes (Signed)
[place] EMERGENC[facility] DEPARTMENT AT [facility] Provider Note   CSN: [id number] Arrival date & time: 11/29/23  0932     History  Chief Complaint  Patient presents with   Ankle Pain   Knee Pain    Heather Grant is a 82 y.o. female Pt complains of sudden onset right ankle and right knee pain since yesterday.  Patient denies any fall, injury.  She has a history of plantar fasciitis as well as gout.  Did not take anything for pain prior to arrival.    Ankle Pain Knee Pain      Home Medications Prior to Admission medications   Medication Sig Start Date End Date Taking? Authorizing Provider  predniSONE (STERAPRED UNI-PAK 21 TAB) 10 MG (21) TBPK tablet Take by mouth daily. Take 6 tabs by mouth daily  for 2 days, then 5 tabs for 2 days, then 4 tabs for 2 days, then 3 tabs for 2 days, 2 tabs for 2 days, then 1 tab by mouth daily for 2 days 11/29/23  Yes Masen Luallen H, PA-C  acetaminophen (TYLENOL) 325 MG tablet Take 2 tablets (650 mg total) by mouth every 6 (six) hours as needed for mild pain (pain score 1-3) (or Fever >/= 101). 11/28/23   Dahal, Melina Schools, MD  albuterol (VENTOLIN HFA) 108 (90 Base) MCG/ACT inhaler USE 1 TO 2 INHALATIONS BY MOUTH  EVERY 6 HOURS AS NEEDED FOR  WHEEZING OR SHORTNESS OF BREATH 11/22/23   Myrlene Broker, MD  amoxicillin-clavulanate (AUGMENTIN) 875-125 MG tablet Take 1 tablet by mouth every 12 (twelve) hours for 15 days. 11/28/23 12/13/23  Lorin Glass, MD  Cholecalciferol 50 MCG (2000 UT) CAPS Vitamin D3 50 mcg (2,000 unit) capsule  Take 1 capsule every day by oral route as directed.    [provider]  colchicine 0.6 MG tablet TAKE 1 TABLET BY MOUTH DAILY FOR GOUT FLARE 07/23/23   Myrlene Broker, MD  DULERA 100-5 MCG/ACT AERO USE 2 INHALATIONS BY MOUTH TWICE DAILY 07/23/23   Myrlene Broker, MD  famotidine (PEPCID) 20 MG tablet TAKE 1 TABLET BY MOUTH DAILY AT  BEDTIME 10/27/22   Myrlene Broker, MD   hydrALAZINE (APRESOLINE) 25 MG tablet TAKE 1 TABLET BY MOUTH 3 TIMES  DAILY 10/04/23   Myrlene Broker, MD  lactobacillus acidophilus & bulgar (LACTINEX) chewable tablet Chew 1 tablet by mouth 3 (three) times daily with meals for 15 days. 11/28/23 12/13/23  Dahal, Melina Schools, MD  LUMIGAN 0.01 % SOLN Place 1 drop into both eyes at bedtime. 12/10/21   [provider]  metoprolol tartrate (LOPRESSOR) 25 MG tablet TAKE 1 TABLET BY MOUTH TWICE  DAILY 10/12/22   Myrlene Broker, MD  nystatin cream (MYCOSTATIN) APPLY TO AFFECTED AREA TWICE A DAY 01/28/22   [provider]  oxyCODONE (OXY IR/ROXICODONE) 5 MG immediate release tablet Take 1 tablet (5 mg total) by mouth every 8 (eight) hours as needed for up to 5 days for moderate pain (pain score 4-6). 11/28/23 12/03/23  Lorin Glass, MD  OXYGEN Inhale 2 L into the lungs at bedtime.    [provider]  pantoprazole (PROTONIX) 40 MG tablet TAKE 1 TABLET BY MOUTH DAILY 30  TO 60 MINUTES BEFORE FIRST MEAL  OF THE DAY 11/22/23   Myrlene Broker, MD  Polyvinyl Alcohol (LUBRICANT DROPS OP) Place 1 drop into both eyes daily.    [provider]  rosuvastatin (CRESTOR) 20 MG tablet TAKE 1 TABLET BY  MOUTH DAILY 10/20/22   Myrlene Broker, MD  VOLTAREN 1 % GEL Apply 2 g topically daily as needed (knee pain).  08/10/14   [provider]      Allergies    Patient has no known allergies.    Review of Systems   Review of Systems  All other systems reviewed and are negative.   Physical Exam Updated Vital Signs BP 118/70 (BP Location: Right Arm)   Pulse 75   Temp 99 F (37.2 C)   Resp 16   Ht 4\' 11"  (1.499 m)   Wt 74.1 kg   SpO2 97%   BMI 32.99 kg/m  Physical Exam Vitals and nursing note reviewed.  Constitutional:      General: She is not in acute distress.    Appearance: Normal appearance.  HENT:     Head: Normocephalic and atraumatic.  Eyes:     General:        Right eye: No discharge.         Left eye: No discharge.  Cardiovascular:     Rate and Rhythm: Normal rate and regular rhythm.     Pulses: Normal pulses.  Pulmonary:     Effort: Pulmonary effort is normal. No respiratory distress.  Musculoskeletal:        General: No deformity.     Comments: Tenderness to palpation of right ankle with mild soft tissue swelling, no redness, no fluctuance  Mild tricompartmental tenderness to palpation about the right knee with no significant effusion, normal range of motion both passively and actively to flexion, extension at the knee.  Skin:    General: Skin is warm and dry.     Capillary Refill: Capillary refill takes less than 2 seconds.  Neurological:     Mental Status: She is alert and oriented to person, place, and time.  Psychiatric:        Mood and Affect: Mood normal.        Behavior: Behavior normal.     ED Results / Procedures / Treatments   Labs (all labs ordered are listed, but only abnormal results are displayed) Labs Reviewed - No data to display  EKG None  Radiology DG Knee Complete 4 Views Right Result Date: 11/29/2023 CLINICAL DATA:  pain EXAM: RIGHT KNEE - COMPLETE 4+ VIEW COMPARISON:  None available FINDINGS: No fracture or dislocation. Small effusion in the suprapatellar bursa. Narrowing of articular cartilage in medial and lateral compartments with spurring about all 3 compartments of the knee. There is mild lateral subluxation of the tibial plateau. Patchy arterial calcifications. IMPRESSION: 1. No acute findings. 2. Small effusion. 3. Tricompartmental degenerative changes. Electronically Signed   By: Corlis Leak M.D.   On: 11/29/2023 07:36   DG Ankle Complete Right Result Date: 11/29/2023 CLINICAL DATA:  pain EXAM: RIGHT ANKLE - COMPLETE 3+ VIEW COMPARISON:  None Available. FINDINGS: There is no evidence of fracture, dislocation, or joint effusion. erosive changes In the distal first metatarsal, possibly degenerative but incompletely characterized.  Small calcaneal spur. There is no other evidence of arthropathy or other focal bone abnormality. Soft tissues are unremarkable. IMPRESSION: 1. No acute findings. 2. Erosive changes in the distal first metatarsal, possibly degenerative but incompletely characterized. Electronically Signed   By: Corlis Leak M.D.   On: 11/29/2023 07:34    Procedures Procedures    Medications Ordered in ED Medications  oxyCODONE-acetaminophen (PERCOCET/ROXICET) 5-325 MG per tablet 1 tablet (1 tablet Oral Given 11/29/23 0736)  colchicine tablet 0.6 mg (  0.6 mg Oral Given 11/29/23 4540)    ED Course/ Medical Decision Making/ A&P                                 Medical Decision Making Amount and/or Complexity of Data Reviewed Radiology: ordered.  Risk Prescription drug management.   This patient is a 82 y.o. female who presents to the ED for concern of right ankle, right knee pain..   Differential diagnoses prior to evaluation: Arthritis, gout, plantar fasciitis, Less clinical concern with no injury for acute fracture, dislocation Past Medical History / Social History / Additional history: Chart reviewed. Pertinent results include: Gout, hypertension, hyperlipidemia, plantar fasciitis, asthma, advanced age  Physical Exam: Physical exam performed. The pertinent findings include: Patient with neurovascularly intact right lower extremity, DP, PT pulses are 2+.  She has some mild soft tissue swelling and tenderness about the ankle diffusely.  Suspect gout flare versus plantar fasciitis flare.  She has intact range of motion actively to plantarflexion, dorsiflexion at the ankle, flexion, extension at the right knee  Medications / Treatment: Given colchicine, Percocet in the ED, discussed treatment at home, between colchicine, prednisone, her blood pressure is well-controlled, she does not have diabetes, given frequent flares, recommended prednisone taper, patient understands and agrees to plan    Disposition: After consideration of the diagnostic results and the patients response to treatment, I feel that patient stable for discharge with gout flare as discussed above.   emergency department workup does not suggest an emergent condition requiring admission or immediate intervention beyond what has been performed at this time. The plan is: as above. The patient is safe for discharge and has been instructed to return immediately for worsening symptoms, change in symptoms or any other concerns.  Final Clinical Impression(s) / ED Diagnoses Final diagnoses:  Acute gout of multiple sites, unspecified cause    Rx / DC Orders ED Discharge Orders          Ordered    predniSONE (STERAPRED UNI-PAK 21 TAB) 10 MG (21) TBPK tablet  Daily        11/29/23 0826              Olene Floss, PA-C 11/29/23 0827    Rolan Bucco, MD 11/29/23 [phone]

## 2023-11-29 NOTE — ED Notes (Signed)
Called PA for pt to be MSEd

## 2023-11-29 NOTE — ED Provider Triage Note (Signed)
Emergency Medicine Provider Triage Evaluation Note  Heather Grant , a 82 y.o. female  was evaluated in triage.  Pt complains of sudden onset right ankle and right knee pain since yesterday.  Patient denies any fall, injury.  She has a history of plantar fasciitis as well as gout.  Did not see anything for pain prior to arrival..  Review of Systems  Positive: Ankle, knee pain Negative: Fall, injury  Physical Exam  Ht 4\' 11"  (1.499 m)   Wt 74.1 kg   BMI 32.99 kg/m  Gen:   Awake, no distress   Resp:  Normal effort  MSK:   Moves extremities without difficulty  Other:  Strong 2+ DP, PT pulses on the right, some tenderness palpation diffusely around the ankle without any step-off, deformity.  Pain with passive and active plantarflexion, dorsiflexion.  Some mild soft tissue swelling and redness noted.  Medical Decision Making  Medically screening exam initiated at 7:32 AM.  Appropriate orders placed.  TYONNA MINETTI was informed that the remainder of the evaluation will be completed by another provider, this initial triage assessment does not replace that evaluation, and the importance of remaining in the ED until their evaluation is complete.  Workup initiated in triage    Olene Floss, [place] 11/29/23 4259

## 2023-11-30 ENCOUNTER — Telehealth: Payer: Self-pay | Admitting: *Deleted

## 2023-11-30 ENCOUNTER — Ambulatory Visit (INDEPENDENT_AMBULATORY_CARE_PROVIDER_SITE_OTHER): Payer: 59 | Admitting: Internal Medicine

## 2023-11-30 ENCOUNTER — Encounter: Payer: Self-pay | Admitting: Internal Medicine

## 2023-11-30 VITALS — BP 132/68 | HR 63 | Temp 98.5°F | Ht 59.0 in

## 2023-11-30 DIAGNOSIS — I10 Essential (primary) hypertension: Secondary | ICD-10-CM

## 2023-11-30 DIAGNOSIS — M1009 Idiopathic gout, multiple sites: Secondary | ICD-10-CM | POA: Diagnosis not present

## 2023-11-30 DIAGNOSIS — D649 Anemia, unspecified: Secondary | ICD-10-CM | POA: Diagnosis not present

## 2023-11-30 DIAGNOSIS — N719 Inflammatory disease of uterus, unspecified: Secondary | ICD-10-CM

## 2023-11-30 DIAGNOSIS — N824 Other female intestinal-genital tract fistulae: Secondary | ICD-10-CM | POA: Diagnosis not present

## 2023-11-30 LAB — SUSCEPTIBILITY RESULT

## 2023-11-30 LAB — COMPREHENSIVE METABOLIC PANEL
ALT: 13 U/L (ref 0–35)
AST: 16 U/L (ref 0–37)
Albumin: 3.4 g/dL — ABNORMAL LOW (ref 3.5–5.2)
Alkaline Phosphatase: 48 U/L (ref 39–117)
BUN: 16 mg/dL (ref 6–23)
CO2: 28 meq/L (ref 19–32)
Calcium: 10.4 mg/dL (ref 8.4–10.5)
Chloride: 103 meq/L (ref 96–112)
Creatinine, Ser: 0.89 mg/dL (ref 0.40–1.20)
GFR: 61.07 mL/min (ref >60.00)
Glucose, Bld: 133 mg/dL — ABNORMAL HIGH (ref 70–99)
Potassium: 4.6 meq/L (ref 3.5–5.1)
Sodium: 140 meq/L (ref 135–145)
Total Bilirubin: 0.5 mg/dL (ref 0.2–1.2)
Total Protein: 6.8 g/dL (ref 6.0–8.3)

## 2023-11-30 LAB — CBC
HCT: 34.2 % — ABNORMAL LOW (ref 36.0–46.0)
Hemoglobin: 11.2 g/dL — ABNORMAL LOW (ref 12.0–15.0)
MCHC: 32.7 g/dL (ref 30.0–36.0)
MCV: 97.9 fL (ref 78.0–100.0)
Platelets: 408 10*3/uL — ABNORMAL HIGH (ref 150.0–400.0)
RBC: 3.5 Mil/uL — ABNORMAL LOW (ref 3.87–5.11)
RDW: 17.3 % — ABNORMAL HIGH (ref 11.5–15.5)
WBC: 12.9 10*3/uL — ABNORMAL HIGH (ref 4.0–10.5)

## 2023-11-30 LAB — SUSCEPTIBILITY, AER + ANAEROB

## 2023-11-30 NOTE — Assessment & Plan Note (Signed)
Taking augmentin for another 2 weeks. Mild loose stools she is tolerating okay. Not having any vaginal drainage or stomach pain.

## 2023-11-30 NOTE — Patient Instructions (Signed)
We are checking the labs today and have given you a copy of the FL2 form. If you want to call [facility] you should be able to see if they have any beds.   We have a Child psychotherapist that will contact you as well.

## 2023-11-30 NOTE — Assessment & Plan Note (Signed)
Checking CBC today and adjust as needed. She is doing ensure daily to help with nutrition.

## 2023-11-30 NOTE — Assessment & Plan Note (Signed)
Have removed hydralazine from list currently. BP normal off all meds today. Will monitor closely and if rising would add back metoprolol 25 mg BID first.

## 2023-11-30 NOTE — Assessment & Plan Note (Signed)
With acute flare today and just started prednisone yesterday. She will continue colchicine daily and prednisone per taper. Discussed typically 2-3 days before steroids kick in well. Unable to ambulate and care for self due to pain currently.

## 2023-11-30 NOTE — Assessment & Plan Note (Signed)
 She is to follow up with the surgeon and ob/gyn these are scheduled. She is taking augmentin  for another 2 weeks and then depending on results. Checking CBC and CMP today to assess. Needs referral to social worker to assist with SNF placement. FL2 done and given to family today. Advised since they want [facility] to contact that facility directly to see if beds available and get process started. They are unsure they can do this.

## 2023-11-30 NOTE — Progress Notes (Signed)
   Subjective:   Patient ID: Heather Grant , female    DOB: 12-16-1940, 82 y.o.   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 She had been stopped on BP meds as not needed while inpatient. Recent ER visit since discharge for possible acute gout with new right knee and ankle pain. She is taking antibiotics with mild loose stool. She is having mild improvement in gout symptoms but still present since yesterday. Due to gout she is unable to care for herself more than when leaving hospital. She is amenable to go to SNF and family is wanting her to go for rehab.  PMH, [facility], social history reviewed and updated. Medication reconciliation done during visit.   Review of Systems  Constitutional:  Positive for activity change, appetite change and fatigue.  HENT: Negative.    Eyes: Negative.   Respiratory:  Negative for cough, chest tightness and shortness of breath.   Cardiovascular:  Negative for chest pain, palpitations and leg swelling.  Gastrointestinal:  Positive for diarrhea. Negative for abdominal distention, abdominal pain, constipation, nausea and vomiting.  Musculoskeletal:  Positive for arthralgias, gait problem and myalgias.  Skin: Negative.   Psychiatric/Behavioral: Negative.      Objective:  Physical Exam Constitutional:      Appearance: She is well-developed. She is obese.  HENT:     Head: Normocephalic and atraumatic.  Cardiovascular:     Rate and Rhythm: Normal rate and regular rhythm.  Pulmonary:     Effort: Pulmonary effort is normal. No respiratory distress.     Breath sounds: Normal breath sounds. No wheezing or rales.  Abdominal:     General: Bowel sounds are normal. There is no distension.     Palpations: Abdomen is soft.     Tenderness: There is no abdominal tenderness. There is no rebound.  Musculoskeletal:        General: Tenderness  present.     Cervical back: Normal range of motion.  Skin:    General: Skin is warm and dry.  Neurological:     Mental Status: She is alert and oriented to person, place, and time.     Coordination: Coordination abnormal.     Comments: Wheelchair and cannot put pressure on right foot to stand     Vitals:   11/30/23 1035  BP: 132/68  Pulse: 63  Temp: 98.5 F (36.9 C)  TempSrc: Oral  SpO2: 99%  Height: 4' 11 (1.499 m)    Assessment & Plan:  Visit time 30 minutes in face to face communication with patient and coordination of care, additional 10 minutes spent in record review, coordination or care, ordering tests, communicating/referring to other healthcare professionals, documenting in medical records all on the same day of the visit for total time 40 minutes spent on the visit.

## 2023-11-30 NOTE — Transitions of Care (Post Inpatient/ED Visit) (Signed)
   11/30/2023  Name: Heather Grant  MRN: [id number] DOB: Aug 20, 1941  Today's TOC FU Call Status: Today's TOC FU Call Status:: Unsuccessful Call (2nd Attempt) Unsuccessful Call (2nd Attempt) Date: 11/30/23  Attempted to reach the patient regarding the most recent Inpatient visit-- received automated outgoing voice message stating that the person you are trying to call has a voice mail box that is full; please hang up and try your call again later; unable to leave voice message requesting call back   Follow Up Plan: Additional outreach attempts will be made to reach the patient to complete the Transitions of Care (Post Inpatient visit) call.   Maximiliano Cromartie Mckinney Arafat Cocuzza, RN, BSN, Media Planner  Transitions of Care  [facility] - [facility] [phone]: direct office

## 2023-12-02 ENCOUNTER — Ambulatory Visit: Payer: Self-pay | Admitting: Licensed Clinical Social Worker

## 2023-12-02 ENCOUNTER — Telehealth: Payer: Self-pay | Admitting: *Deleted

## 2023-12-02 NOTE — Patient Outreach (Signed)
 [facility]) Care Management  12/02/2023  Heather Grant  1941/01/11 [id number]  Duplicate encounter- pls disregard and see TOC note from earlier today  [name] [name], RN, BSN, CCRN Alumnus RN Care Manager  Transitions of Care  [facility] - [facility] [phone]: direct office

## 2023-12-02 NOTE — Patient Instructions (Signed)
 Visit Information  Thank you for taking time to visit with me today. Please don't hesitate to contact me if I can be of assistance to you before our next scheduled telephone appointment.  Our next appointment is by telephone on Thursday 12/09/23 at 9:30 am  Please call the care guide team at [phone] if you need to cancel or reschedule your appointment.   Patient Goals/Self-Care Activities: Participate in Transition of Care Program/Attend TOC scheduled calls Take all medications as prescribed Attend all scheduled provider appointments Call provider office for new concerns or questions  Continue pacing activity as your recuperation from your recent gout flare continues Use assistive devices as needed to prevent falls- your new rolling walker Your daughter [name] is going to contact the home health team that was referred at the time of your hospital discharge; please make sure that this is completed Please stay in touch with the social worker that has been calling you [facility])  Following is a copy of your care plan:   Goals Addressed             This Visit's Progress    TOC 30-day Program Care Plan   On track    Current Barriers:  Medication management daughters currently managing medications; patient was previously independent in medication management; daughters need support/ coaching around medications post-recent hospital discharge Provider appointments confirmed had HFU office visit with PCP on 11/30/23- [facility] LCSW referral completed for possible SNF- rehabilitation placement: however- verified patient already has established LCSW active in her care (Rolin Kerns)-- will message Rolin accordingly to update  Home Health services ordered at time of hospital discharge for PT/ RN: [facility]; daughter reports on 12/02/23 she has not yet heard from [facility]- daughter confirms she has phone number for home health agency [phone]) and will call them today to inquire about  initiation of services: made daughter [name] aware that patient's phone number had full voice mail box with TOC outreaches last week: [name] will follow up to ensure voice mail is cleared and will request home health agency to call her with updates- this was encouraged  Equipment/DME daughter/ patient report that patient obtained and is using rollator walker as ordered post-hospital discharge Hospitalization 12/13-- 29/2024:  discharged to home/ self care after refusing SNF- rehabilitation post-discharge; unfortunately, patient experienced acute gout flare post-discharge requiring ED visit on 11/29/23- she was prescribed prednisone  and was discharged home from ED to self-care Elderly patient- independent prior to recent hospitalization; now requiring assistance with self-care at home due to ongoing/ acute gout flare; reports supportive local daughters who are able to assist in care needs: patient requested that I contact daughter [name] at [phone] for weekly TOC 30-day program outreaches  RNCM Clinical Goal(s):  Patient will work with the Care Management team over the next 30 days to address Transition of Care Barriers: Medication Management Support at home Provider appointments Home Health services Equipment/DME take all medications exactly as prescribed and will call provider for medication related questions as evidenced by caregiver/ patient reporting during RN CM weekly outreaches with daughter [name]- per patient request attend all scheduled medical appointments: 12/06/23- LCSW telephone visit- LCSW currently established as evidenced by collaboration with LCSW team as indicated during [facility] 30-day program work with home health agency/ team to initiate/ participate in home health services as evidenced by patient/ caregiver reporting/ collaboration with home health agency as indicated during RN CM TOC 30-day program weekly outreaches not experience hospital admission as evidenced by review of  EMR. Hospital Admissions  in last 6 months = 1 Verbalize appropriate support at home for care needs  through collaboration with RN Care manager, provider, and care team.   Interventions: Evaluation of current treatment plan related to  self management and patient's adherence to plan as established by provider  Transitions of Care:  New goal. 12/02/23 Durable Medical Equipment (DME) needs assessed with patient/caregiver Doctor Visits  - discussed the importance of doctor visits Communication with established LCSW Rolin Kerns re: new (duplicate) referral from PCP on 11/30/23-- updated LCSW re: possible need for SNF placement for rehabilitation, per patient report on 11/30/23 to PCP; today, on 12/02/23- patient and daughter are not sure this will be necessary, given prednisone  is helping to reduce symptoms from patient's post-discharge gout flare  Contacted provider for patient needs PCP- to confirm patient has established LCSW active in her care Post discharge activity limitations prescribed by provider reviewed Discussed current clinical condition with  patient and subsequently with her daughter:  They both report that patient's post-discharge gout flare has improved and patient is able to ambulate in her home using walker; she is currently staying in her ground level apartment alone; family is checking in and providing care/ support 3 or 4 times a day; patient and daughter [name] both deny specific clinical concerns today Daughter confirms  patient is taking prednisone  for acute gout flare as instructed post-ED visit on 11/29/23- however- she tells me patient has NOT been taking COLCHICINE  every day as instructed by EDP 11/29/23 and by PCP on 11/30/23: advised daughter to resume giving this medication every day as instructed during both post-discharge visits-- daughter verbalizes understanding and agreement, states she will do Provided education around expected/ usual side effects of antibiotics  and importance of taking concurrent pro-biotics as instructed: [name] tells me patient is taking both antibiotic and probiotic as instructed Confirmed uses assistive devices on regular basis, post-recent hospital discharge: rolling walker Role of home health services with importance of participation/ ongoing engagement: daughter has not yet heard from home health agency; daughter is currently driving during [facility] call- she confirms she has contact information for home health agency and will call as soon as we hang up; to initiate home health services as ordered at time of hospital discharge on 11/28/23; encouraged daughter to ensure that patient's voice mail box is cleared to receive new messages; encouraged [name] to make herself as primary contact person for medical affairs- as she proactively tells me she is the best contact for patient and manages all aspects of her medical care at home Provided education around benefit of conservative post-hospital discharge activity; need to pace activity without over-doing  Reviewed ED visit on 11/29/23 and PCP provider office visit on 11/30/23 with patient's daughter- other than need to continue colchicine , as noted above-- caregiver verbalizes good understanding of same; denies questions/ concerns post-recent provider office visits Reviewed upcoming provider office visit to ID provider 12/07/23: confirmed patient/ caregiver is aware of and has plans to attend as scheduled   Patient Goals/Self-Care Activities: Participate in Transition of Care Program/Attend TOC scheduled calls Take all medications as prescribed Attend all scheduled provider appointments Call provider office for new concerns or questions  Continue pacing activity as your recuperation from your recent gout flare continues Use assistive devices as needed to prevent falls- your new rolling walker Your daughter [name] is going to contact the home health team that was referred at the time of  your hospital discharge; please make sure that this is completed Please stay in  touch with the social worker that has been calling you Monique)  Follow Up Plan:  Telephone follow up appointment with care management team member scheduled for:  scheduled with  patient's daughter [name] on Thursday 12/09/23 at 9:30 am         The patient verbalized understanding of instructions, educational materials, and care plan provided today and DECLINED offer to receive copy of patient instructions, educational materials, and care plan.   Telephone follow up appointment with care management team member scheduled for: LCSW on 12/06/23 and RN CM on 12/09/23  If you are experiencing a Mental Health or Behavioral Health Crisis or need someone to talk to, please  call the Suicide and Crisis Lifeline: 988 call the [place]  National Suicide Prevention Lifeline: 769-683-6250 or TTY: 410-117-3002 TTY 9890494567) to talk to a trained counselor call 1-800-273-TALK (toll free, 24 hour hotline) go to [facility] [address] [place] [phone]) call the [facility] Crisis Line: 603-480-4890 call 911   Beatris Blinda Lawrence, RN, BSN, CCRN Alumnus RN Care Manager  Transitions of Care  [facility] - Population Health  [place] [phone]: direct office

## 2023-12-02 NOTE — Transitions of Care (Post Inpatient/ED Visit) (Signed)
 12/02/2023  Name: Heather Grant  MRN: [id number] DOB: 1941-04-21  Today's TOC FU Call Status: Today's TOC FU Call Status:: Successful TOC FU Call Completed TOC FU Call Complete Date: 12/02/23 Patient's Name and Date of Birth confirmed.  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 my daughters and my son come over regularly to check on me, I am using the walker.  The gout is getting better.  I am not sure if I want to go to the nursing home now that the gout isd better.  Call [name] to go over my medicines) Any questions or concerns?: No  Items Reviewed: Did you receive and understand the discharge instructions provided?: Yes (thoroughly reviewed with patient and her daughter [name] who verbalizes good understanding of same) Medications obtained,verified, and reconciled?: Yes (Medications Reviewed) (Full medication reconciliation/ review completed; no concerns or discrepancies identified; confirmed patient obtained/ is taking all newly Rx'd medications as instructed; family-manages medications and denies questions/ concerns around medications today) Any new allergies since your discharge?: No Dietary orders reviewed?: Yes Type of Diet Ordered:: Healthy as possible Do you have support at home?: Yes People in Home: alone Name of Support/Comfort Primary Source: Reports resides alone/ independent in self-care activities at baseline; supportive local daughters assists as/ if needed/ indicated- helping more after I had the gout flare up after my hospital visit;  patient and caregiver report that daughters are going to patient's home 3-4 times per day to provide  care support as needed  Medications Reviewed Today: Medications Reviewed Today     Reviewed by Hermine Feria M, RN (Registered Nurse) on 12/02/23 at 1135  Med List Status: <None>   Medication Order Taking? Sig Documenting Provider Last Dose Status Informant  acetaminophen  (TYLENOL ) 325 MG tablet [id number] Yes Take 2 tablets (650 mg total) by mouth every 6 (six) hours as needed for mild pain (pain score 1-3) (or Fever >/= 101). Arlice Reichert, MD Taking Active   albuterol  (VENTOLIN  HFA) 108 (90 Base) MCG/ACT inhaler [id number] Yes USE 1 TO 2 INHALATIONS BY MOUTH  EVERY 6 HOURS AS NEEDED FOR  WHEEZING OR SHORTNESS OF BREATH Rollene Almarie LABOR, MD Taking Active   amoxicillin -clavulanate (AUGMENTIN ) 875-125 MG tablet [id number] Yes Take 1 tablet by mouth every 12 (twelve) hours for 15 days. Arlice Reichert, MD Taking Active   Cholecalciferol 50 MCG (2000 UT) CAPS [id number] No Vitamin D3 50 mcg (2,000 unit) capsule  Take 1 capsule every day by oral route as directed.  Patient not taking: Reported on 12/02/2023   [provider] Not Taking Active Self  colchicine  0.6 MG tablet [id number] Yes TAKE 1 TABLET BY MOUTH DAILY FOR GOUT FLARE Rollene Almarie LABOR, MD Taking Active Self  DULERA  100-5 MCG/ACT TERESE [id number] Yes USE 2 INHALATIONS BY MOUTH TWICE DAILY Rollene Almarie LABOR, MD Taking Active Self  famotidine  (PEPCID ) 20 MG tablet [id number] Yes TAKE 1 TABLET BY MOUTH DAILY AT  BEDTIME Rollene Almarie LABOR, MD Taking Active Self  lactobacillus acidophilus & bulgar (LACTINEX) chewable tablet [id number] Yes Chew 1 tablet by mouth 3 (three) times daily with meals for 15 days. Arlice Reichert, MD Taking Active   LUMIGAN 0.01 % SOLN [id number] Yes Place 1 drop into both eyes at bedtime. [provider] Taking  Active Self  metoprolol  tartrate (LOPRESSOR ) 25 MG tablet [id number] Yes TAKE 1 TABLET BY MOUTH TWICE  DAILY Rollene Almarie LABOR, MD Taking Active Self  nystatin  cream (MYCOSTATIN )  [id number] Yes APPLY TO AFFECTED AREA TWICE A DAY [provider] Taking Active Self  oxyCODONE  (OXY IR/ROXICODONE ) 5 MG immediate release tablet [id number] Yes Take 1 tablet (5 mg total) by mouth every 8 (eight) hours as needed for up to 5 days for moderate pain (pain score 4-6). Arlice Reichert, MD Taking Active   OXYGEN  [id number] Yes Inhale 2 L into the lungs at bedtime. [provider] Taking Active Self           Med Note (Apple Dearmas M   Thu Dec 02, 2023 11:29 AM) 12/02/23: Reports during TOC call, has at her home-- reports uses as needed; caregiver- daughter reports she has not needed often lately  pantoprazole  (PROTONIX ) 40 MG tablet [id number] Yes TAKE 1 TABLET BY MOUTH DAILY 30  TO 60 MINUTES BEFORE FIRST MEAL  OF THE DAY Rollene Almarie LABOR, MD Taking Active   Polyvinyl Alcohol (LUBRICANT DROPS OP) [id number] Yes Place 1 drop into both eyes daily. [provider] Taking Active Self  predniSONE  (STERAPRED UNI-PAK 21 TAB) 10 MG (21) TBPK tablet [id number] Yes Take by mouth daily. Take 6 tabs by mouth daily  for 2 days, then 5 tabs for 2 days, then 4 tabs for 2 days, then 3 tabs for 2 days, 2 tabs for 2 days, then 1 tab by mouth daily for 2 days Prosperi, Christian H, PA-C Taking Active   rosuvastatin  (CRESTOR ) 20 MG tablet [id number] Yes TAKE 1 TABLET BY MOUTH DAILY Rollene Almarie LABOR, MD Taking Active Self  VOLTAREN 1 % GEL [id number] Yes Apply 2 g topically daily as needed (knee pain).  [provider] Taking Active Self           Med Note EPIFANIO, SHARENE BIRCH   Fri Oct 12, 2014  3:34 PM)             Home Care and Equipment/Supplies: Were Home Health Services Ordered?: Yes Name of Home Health Agency:: [facility] RN and PT: [phone] Has Agency set up a time to come to your home?: No EMR reviewed for Home Health Orders: Orders present/patient has not received call (refer to CM for follow-up) (enrolled in [facility] 30-day program with  scheduled call for next week; patient's daughter denies need for assistance in making  call to home health agency during our call today) Any new equipment or medical supplies ordered?: Yes (rolling walker) Name of Medical supply agency?: [employer] Were you able to get the equipment/medical supplies?: Yes Do you have any questions related to the use of the equipment/supplies?: No  Functional Questionnaire: Do you need assistance with bathing/showering or dressing?: Yes (family/ daughters assisting after recent hospital discharge/ gout flare- reports was previously independent in self-care) Do you need assistance with meal preparation?: Yes (family/ daughters assisting after recent hospital discharge/ gout flare- reports was previously independent in self-care) Do you need assistance with eating?: No Do you have difficulty maintaining continence: No Do you need assistance with getting out of bed/getting out of a chair/moving?: No Do you have difficulty managing or taking your medications?: Yes (family/ daughters assisting after recent hospital discharge/ gout flare- reports was previously independent in self-care)  Follow up appointments reviewed: PCP Follow-up appointment confirmed?: Yes Date of PCP follow-up appointment?: 11/30/23 Follow-up Provider: PCP Specialist Hospital Follow-up appointment confirmed?: Yes Date of  Specialist follow-up appointment?: 12/07/23 Follow-Up Specialty Provider:: ID provider Do you need transportation to your follow-up appointment?: No Do you understand care options if your condition(s) worsen?: Yes-patient verbalized understanding  SDOH Interventions Today    Flowsheet Row Most Recent Value  SDOH Interventions   Food Insecurity Interventions Intervention Not Indicated  Housing Interventions Intervention Not Indicated  Transportation Interventions Intervention Not Indicated  [daughter reports today that she and other family provide transportation,  states she  also has some kinf od transportation service, but I don't know the details about that]  Utilities Interventions Intervention Not Indicated       Goals Addressed             This Visit's Progress    TOC 30-day Program Care Plan   On track    Current Barriers:  Medication management daughters currently managing medications; patient was previously independent in medication management; daughters need support/ coaching around medications post-recent hospital discharge Provider appointments confirmed had HFU office visit with PCP on 11/30/23- [facility] LCSW referral completed for possible SNF- rehabilitation placement: however- verified patient already has established LCSW active in her care (Rolin Kerns)-- will message Rolin accordingly to update  Home Health services ordered at time of hospital discharge for PT/ RN: [facility]; daughter reports on 12/02/23 she has not yet heard from [facility]- daughter confirms she has phone number for home health agency [phone]) and will call them today to inquire about initiation of services: made daughter [name] aware that patient's phone number had full voice mail box with TOC outreaches last week: [name] will follow up to ensure voice mail is cleared and will request home health agency to call her with updates- this was encouraged  Equipment/DME daughter/ patient report that patient obtained and is using rollator walker as ordered post-hospital discharge Hospitalization 12/13-- 29/2024:  discharged to home/ self care after refusing SNF- rehabilitation post-discharge; unfortunately, patient experienced acute gout flare post-discharge requiring ED visit on 11/29/23- she was prescribed prednisone  and was discharged home from ED to self-care Elderly patient- independent prior to recent hospitalization; now requiring assistance with self-care at home due to ongoing/ acute gout flare; reports supportive local daughters who are able to assist in care needs:  patient requested that I contact daughter [name] at [phone] for weekly TOC 30-day program outreaches  RNCM Clinical Goal(s):  Patient will work with the Care Management team over the next 30 days to address Transition of Care Barriers: Medication Management Support at home Provider appointments Home Health services Equipment/DME take all medications exactly as prescribed and will call provider for medication related questions as evidenced by caregiver/ patient reporting during RN CM weekly outreaches with daughter [name]- per patient request attend all scheduled medical appointments: 12/06/23- LCSW telephone visit- LCSW currently established as evidenced by collaboration with LCSW team as indicated during [facility] 30-day program work with home health agency/ team to initiate/ participate in home health services as evidenced by patient/ caregiver reporting/ collaboration with home health agency as indicated during RN CM TOC 30-day program weekly outreaches not experience hospital admission as evidenced by review of EMR. Hospital Admissions in last 6 months = 1 Verbalize appropriate support at home for care needs  through collaboration with RN Care manager, provider, and care team.   Interventions: Evaluation of current treatment plan related to  self management and patient's adherence to plan as established by provider  Transitions of Care:  New goal. 12/02/23 Durable Medical Equipment (DME) needs assessed with patient/caregiver Doctor Visits  - discussed  the importance of doctor visits Communication with established LCSW Rolin Kerns re: new (duplicate) referral from PCP on 11/30/23-- updated LCSW re: possible need for SNF placement for rehabilitation, per patient report on 11/30/23 to PCP; today, on 12/02/23- patient and daughter are not sure this will be necessary, given prednisone  is helping to reduce symptoms from patient's post-discharge gout flare  Contacted provider for patient needs  PCP- to confirm patient has established LCSW active in her care Post discharge activity limitations prescribed by provider reviewed Discussed current clinical condition with  patient and subsequently with her daughter:  They both report that patient's post-discharge gout flare has improved and patient is able to ambulate in her home using walker; she is currently staying in her ground level apartment alone; family is checking in and providing care/ support 3 or 4 times a day; patient and daughter [name] both deny specific clinical concerns today Daughter confirms  patient is taking prednisone  for acute gout flare as instructed post-ED visit on 11/29/23- however- she tells me patient has NOT been taking COLCHICINE  every day as instructed by EDP 11/29/23 and by PCP on 11/30/23: advised daughter to resume giving this medication every day as instructed during both post-discharge visits-- daughter verbalizes understanding and agreement, states she will do Provided education around expected/ usual side effects of antibiotics and importance of taking concurrent pro-biotics as instructed: [name] tells me patient is taking both antibiotic and probiotic as instructed Confirmed uses assistive devices on regular basis, post-recent hospital discharge: rolling walker Role of home health services with importance of participation/ ongoing engagement: daughter has not yet heard from home health agency; daughter is currently driving during [facility] call- she confirms she has contact information for home health agency and will call as soon as we hang up; to initiate home health services as ordered at time of hospital discharge on 11/28/23; encouraged daughter to ensure that patient's voice mail box is cleared to receive new messages; encouraged [name] to make herself as primary contact person for medical affairs- as she proactively tells me she is the best contact for patient and manages all aspects of her medical care at  home Provided education around benefit of conservative post-hospital discharge activity; need to pace activity without over-doing  Reviewed ED visit on 11/29/23 and PCP provider office visit on 11/30/23 with patient's daughter- other than need to continue colchicine , as noted above-- caregiver verbalizes good understanding of same; denies questions/ concerns post-recent provider office visits Reviewed upcoming provider office visit to ID provider 12/07/23: confirmed patient/ caregiver is aware of and has plans to attend as scheduled   Patient Goals/Self-Care Activities: Participate in Transition of Care Program/Attend TOC scheduled calls Take all medications as prescribed Attend all scheduled provider appointments Call provider office for new concerns or questions  Continue pacing activity as your recuperation from your recent gout flare continues Use assistive devices as needed to prevent falls- your new rolling walker Your daughter [name] is going to contact the home health team that was referred at the time of your hospital discharge; please make sure that this is completed Please stay in touch with the social worker that has been calling you Monique)  Follow Up Plan:  Telephone follow up appointment with care management team member scheduled for:  scheduled with  patient's daughter [name] on Thursday 12/09/23 at 9:30 am         Verl Whitmore Mckinney Isreal Moline, RN, BSN, Media Planner  Transitions of Care  [facility] - [employer]  [place] [phone]: direct office

## 2023-12-03 ENCOUNTER — Telehealth: Payer: Self-pay | Admitting: *Deleted

## 2023-12-03 DIAGNOSIS — R0602 Shortness of breath: Secondary | ICD-10-CM | POA: Diagnosis not present

## 2023-12-03 DIAGNOSIS — J45998 Other asthma: Secondary | ICD-10-CM | POA: Diagnosis not present

## 2023-12-03 NOTE — Patient Outreach (Signed)
 Care Management  Transitions of Care Program Transitions of Care Post-discharge week # 1/ day # 2   12/03/2023 Name: Heather Grant  MRN: [id number] DOB: 04/27/1941  Subjective: Heather Grant  is a 83 y.o. year old female who is a primary care patient of Rollene Almarie LABOR, MD. The Care Management team Engaged with patient Engaged with patient's daughter/ caregiver [name] by telephone to assess and address transitions of care needs.   Consent to Services:  Patient was given information about care management services, agreed to services, and gave verbal consent to participate.   Enrolled 12/02/23  Assessment: 12/03/23: received voice message from patient's daughter [name], requesting call back; contacted daughter back- she is asking about lab values/ results from HFU PCP office visit: specifically WBC: reviewed results with daughter; explained that office staff had apparently contacted patient with results and left voice message re: increased WBC: read the notes from PCP to the ID specialist, and shared his recommendation that patient remain on antibiotics as per discharge instructions; [name] confirmed that patient is taking the antibiotics as instructed; she confirms she will be attending ID HFU office visit with patient on 12/07/23; she also confirms today that she did contact [facility] home [facility] agency and the initiation of services visit will be completed/ is scheduled for Sunday 12/05/23; daughter denies further questions/ concerns today           SDOH Interventions    Flowsheet Row Telephone from 12/02/2023 in [place] [facility] ED to Hosp-Admission (Discharged) from 11/12/2023 in [facility]  SURGICAL Care Coordination from 08/03/2023 in [facility] Care Coordination Care Coordination from 06/29/2023 in [facility] Care Coordination Care Coordination from 06/16/2023 in [facility] Care Coordination Care Coordination from 05/11/2023 in [facility] Coordination  SDOH Interventions        Food Insecurity Interventions Intervention Not Indicated -- -- Intervention Not Indicated -- --  Housing Interventions Intervention Not Indicated -- -- -- Intervention Not Indicated --  Transportation Interventions Intervention Not Indicated  [daughter reports today that she and other family provide transportation,  states she also has some kinf od transportation service, but I don't know the details about that] Inpatient TOC Intervention Not Indicated  [patient now has SCAT and Medicaid Transportation] -- -- SCAT Psychologist, Clinical), Payor Benefit  [assisted her with SCAT application,  she will also call to connect with Medicaid Transporation]  Utilities Interventions Intervention Not Indicated -- -- -- Intervention Not Indicated --        Goals Addressed             This Visit's Progress    TOC 30-day Program Care Plan   On track    Current Barriers:  Medication management daughters currently managing medications; patient was previously independent in medication management; daughters need support/ coaching around medications post-recent hospital discharge Provider appointments confirmed had HFU office visit with PCP on 11/30/23- [facility] LCSW referral completed for possible SNF- rehabilitation placement: however- verified patient already has established LCSW active in her care (Rolin Kerns)-- will message Rolin accordingly to update  Home Health services ordered at time of hospital discharge for PT/ RN: [facility]; daughter reports on 12/02/23 she has not yet heard from [facility]- daughter confirms she has phone number for home health agency [phone]) and will call them today to inquire about initiation of services: made daughter [name] aware that patient's phone number had full voice mail  box with TOC  outreaches last week: [name] will follow up to ensure voice mail is cleared and will request home health agency to call her with updates- this was encouraged  Equipment/DME daughter/ patient report that patient obtained and is using rollator walker as ordered post-hospital discharge Hospitalization 12/13-- 29/2024:  discharged to home/ self care after refusing SNF- rehabilitation post-discharge; unfortunately, patient experienced acute gout flare post-discharge requiring ED visit on 11/29/23- she was prescribed prednisone  and was discharged home from ED to self-care Elderly patient- independent prior to recent hospitalization; now requiring assistance with self-care at home due to ongoing/ acute gout flare; reports supportive local daughters who are able to assist in care needs: patient requested that I contact daughter [name] at [phone] for weekly TOC 30-day program outreaches  RNCM Clinical Goal(s):  Patient will work with the Care Management team over the next 30 days to address Transition of Care Barriers: Medication Management Support at home Provider appointments Home Health services Equipment/DME take all medications exactly as prescribed and will call provider for medication related questions as evidenced by caregiver/ patient reporting during RN CM weekly outreaches with daughter [name]- per patient request attend all scheduled medical appointments: 12/06/23- LCSW telephone visit- LCSW currently established as evidenced by collaboration with LCSW team as indicated during [facility] 30-day program work with home health agency/ team to initiate/ participate in home health services as evidenced by patient/ caregiver reporting/ collaboration with home health agency as indicated during RN CM TOC 30-day program weekly outreaches not experience hospital admission as evidenced by review of EMR. Hospital Admissions in last 6 months = 1 Verbalize appropriate support at home for care needs  through  collaboration with RN Care manager, provider, and care team.   Interventions: Evaluation of current treatment plan related to  self management and patient's adherence to plan as established by provider  Transitions of Care:  Goal on track:  Yes. 12/03/23  12/03/23: received voice message from patient's daughter [name], requesting call back; contacted daughter back- she is asking about lab values/ results from HFU PCP office visit: specifically WBC: reviewed results with daughter; explained that office staff had apparently contacted patient with results and left voice message re: increased WBC: read the notes from PCP to the ID specialist, and shared his recommendation that patient remain on antibiotics as per discharge instructions; [name] confirmed that patient is taking the antibiotics as instructed; she confirms she will be attending ID HFU office visit with patient on 12/07/23; she also confirms today that she did contact [facility] home health agency and the initiation of services visit will be completed/ is scheduled for Sunday 12/05/23; daughter denies further questions/ concerns today   Durable Medical Equipment (DME) needs assessed with patient/caregiver Doctor Visits  - discussed the importance of doctor visits Communication with established LCSW Rolin Kerns re: new (duplicate) referral from PCP on 11/30/23-- updated LCSW re: possible need for SNF placement for rehabilitation, per patient report on 11/30/23 to PCP; today, on 12/02/23- patient and daughter are not sure this will be necessary, given prednisone  is helping to reduce symptoms from patient's post-discharge gout flare  Contacted provider for patient needs PCP- to confirm patient has established LCSW active in her care Post discharge activity limitations prescribed by provider reviewed Discussed current clinical condition with  patient and subsequently with her daughter:  They both report that patient's post-discharge gout  flare has improved and patient is able to ambulate in her home using walker; she is currently staying in her ground  level apartment alone; family is checking in and providing care/ support 3 or 4 times a day; patient and daughter [name] both deny specific clinical concerns today Daughter confirms  patient is taking prednisone  for acute gout flare as instructed post-ED visit on 11/29/23- however- she tells me patient has NOT been taking COLCHICINE  every day as instructed by EDP 11/29/23 and by PCP on 11/30/23: advised daughter to resume giving this medication every day as instructed during both post-discharge visits-- daughter verbalizes understanding and agreement, states she will do Provided education around expected/ usual side effects of antibiotics and importance of taking concurrent pro-biotics as instructed: [name] tells me patient is taking both antibiotic and probiotic as instructed Confirmed uses assistive devices on regular basis, post-recent hospital discharge: rolling walker Role of home health services with importance of participation/ ongoing engagement: daughter has not yet heard from home health agency; daughter is currently driving during [facility] call- she confirms she has contact information for home health agency and will call as soon as we hang up; to initiate home health services as ordered at time of hospital discharge on 11/28/23; encouraged daughter to ensure that patient's voice mail box is cleared to receive new messages; encouraged [name] to make herself as primary contact person for medical affairs- as she proactively tells me she is the best contact for patient and manages all aspects of her medical care at home Provided education around benefit of conservative post-hospital discharge activity; need to pace activity without over-doing  Reviewed ED visit on 11/29/23 and PCP provider office visit on 11/30/23 with patient's daughter- other than need to continue colchicine , as noted  above-- caregiver verbalizes good understanding of same; denies questions/ concerns post-recent provider office visits Reviewed upcoming provider office visit to ID provider 12/07/23: confirmed patient/ caregiver is aware of and has plans to attend as scheduled   Patient Goals/Self-Care Activities: Participate in Transition of Care Program/Attend [facility] scheduled calls Take all medications as prescribed Attend all scheduled provider appointments Call provider office for new concerns or questions  Continue pacing activity as your recuperation from your recent gout flare continues Use assistive devices as needed to prevent falls- your new rolling walker Your daughter [name] is going to contact the home health team that was referred at the time of your hospital discharge; please make sure that this is completed Please stay in touch with the social worker that has been calling you Monique)  Follow Up Plan:  Telephone follow up appointment with care management team member scheduled for:  scheduled with  patient's daughter [name] on Thursday 12/09/23 at 9:30 am          Plan: Telephone follow up appointment with care management team member scheduled for:  Thursday 12/09/23 at 9:30 am  Beatris Blinda Lawrence, RN, BSN, Media Planner  Transitions of Care  [facility] - [facility] [phone]: direct office

## 2023-12-03 NOTE — Patient Outreach (Signed)
  Care Coordination   Follow Up Visit Note   12/02/2023 Name: Heather Grant  MRN: [id number] DOB: 1941-07-08  Heather Grant  is a 83 y.o. year old female who sees Rollene Almarie LABOR, MD for primary care. I spoke with  Heather Grant  by phone today.  What matters to the patients health and wellness today?  Level of Care    Goals Addressed             This Visit's Progress    Obtain Supportive Resources-MA Re-certification   On track    Activities and task to complete in order to accomplish goals.   Keep all upcoming appointments discussed today Continue with compliance of taking medication prescribed by Doctor Implement healthy coping skills discussed to assist with management of symptoms          SDOH assessments and interventions completed:  No     Care Coordination Interventions:  Yes, provided  Interventions Today    Flowsheet Row Most Recent Value  Chronic Disease   Chronic disease during today's visit Other  [[place]]  General Interventions   General Interventions Discussed/Reviewed General Interventions Reviewed, Community Resources, Doctor Visits, Level of Care  Doctor Visits Discussed/Reviewed Doctor Visits Reviewed  Level of Care Skilled Nursing Facility  [Pt is thinking about HH vs SNF]  Mental Health Interventions   Mental Health Discussed/Reviewed Mental Health Reviewed, Coping Strategies  [Pt receives strong support from family]  Safety Interventions   Safety Discussed/Reviewed Safety Reviewed, Fall Risk       Follow up plan: Follow up call scheduled for 1-2 weeks    Encounter Outcome:  Patient Visit Completed   Rolin Kerns, MSW, LCSW [facility] Care Management [facility]  [facility] [email or web address] Phone [phone] 5:47 PM

## 2023-12-03 NOTE — Patient Instructions (Signed)
 Visit Information  Thank you for taking time to visit with me today. Please don't hesitate to contact me if I can be of assistance to you.   Following are the goals we discussed today:   Goals Addressed             This Visit's Progress    Obtain Supportive Resources-MA Re-certification   On track    Activities and task to complete in order to accomplish goals.   Keep all upcoming appointments discussed today Continue with compliance of taking medication prescribed by Doctor Implement healthy coping skills discussed to assist with management of symptoms          Our next appointment is by telephone on 1/6 at 10 AM  Please call the care guide team at [phone] if you need to cancel or reschedule your appointment.   If you are experiencing a Mental Health or Behavioral Health Crisis or need someone to talk to, please call the Suicide and Crisis Lifeline: 988 call 911   The patient verbalized understanding of instructions, educational materials, and care plan provided today and DECLINED offer to receive copy of patient instructions, educational materials, and care plan.   Orel Hord, MSW, LCSW [facility] Care Management [place]  [facility] [email or web address] Phone [phone] 5:47 PM

## 2023-12-05 ENCOUNTER — Telehealth: Payer: Self-pay | Admitting: Family Medicine

## 2023-12-05 DIAGNOSIS — Z792 Long term (current) use of antibiotics: Secondary | ICD-10-CM | POA: Diagnosis not present

## 2023-12-05 DIAGNOSIS — M170 Bilateral primary osteoarthritis of knee: Secondary | ICD-10-CM | POA: Diagnosis not present

## 2023-12-05 DIAGNOSIS — M1990 Unspecified osteoarthritis, unspecified site: Secondary | ICD-10-CM | POA: Diagnosis not present

## 2023-12-05 DIAGNOSIS — M19041 Primary osteoarthritis, right hand: Secondary | ICD-10-CM | POA: Diagnosis not present

## 2023-12-05 DIAGNOSIS — I480 Paroxysmal atrial fibrillation: Secondary | ICD-10-CM | POA: Diagnosis not present

## 2023-12-05 DIAGNOSIS — Z9981 Dependence on supplemental oxygen: Secondary | ICD-10-CM | POA: Diagnosis not present

## 2023-12-05 DIAGNOSIS — M109 Gout, unspecified: Secondary | ICD-10-CM | POA: Diagnosis not present

## 2023-12-05 DIAGNOSIS — M19042 Primary osteoarthritis, left hand: Secondary | ICD-10-CM | POA: Diagnosis not present

## 2023-12-05 DIAGNOSIS — G43909 Migraine, unspecified, not intractable, without status migrainosus: Secondary | ICD-10-CM | POA: Diagnosis not present

## 2023-12-05 DIAGNOSIS — G4733 Obstructive sleep apnea (adult) (pediatric): Secondary | ICD-10-CM | POA: Diagnosis not present

## 2023-12-05 DIAGNOSIS — E860 Dehydration: Secondary | ICD-10-CM | POA: Diagnosis not present

## 2023-12-05 DIAGNOSIS — N3000 Acute cystitis without hematuria: Secondary | ICD-10-CM | POA: Diagnosis not present

## 2023-12-05 DIAGNOSIS — I10 Essential (primary) hypertension: Secondary | ICD-10-CM | POA: Diagnosis not present

## 2023-12-05 DIAGNOSIS — D72829 Elevated white blood cell count, unspecified: Secondary | ICD-10-CM | POA: Diagnosis not present

## 2023-12-05 DIAGNOSIS — Z7951 Long term (current) use of inhaled steroids: Secondary | ICD-10-CM | POA: Diagnosis not present

## 2023-12-05 DIAGNOSIS — D649 Anemia, unspecified: Secondary | ICD-10-CM | POA: Diagnosis not present

## 2023-12-05 DIAGNOSIS — J4540 Moderate persistent asthma, uncomplicated: Secondary | ICD-10-CM | POA: Diagnosis not present

## 2023-12-05 DIAGNOSIS — E785 Hyperlipidemia, unspecified: Secondary | ICD-10-CM | POA: Diagnosis not present

## 2023-12-05 NOTE — Telephone Encounter (Addendum)
 Received a phone call from on call service- pt was discharged from hospital a week ago and told to hold BP meds when she went home However today [facility] nurse notes BP 172/86 and 192/80 on recheck, pulse 50s- low 60s Pt was taking metoprolol  AND hydralazine  in the past, not taking either now  Due to relative bradycardia I asked them to restart hydralazine  25 TID and will ask PCP to recheck this week

## 2023-12-06 ENCOUNTER — Encounter: Payer: Self-pay | Admitting: Physician Assistant

## 2023-12-06 ENCOUNTER — Telehealth: Payer: Self-pay | Admitting: *Deleted

## 2023-12-06 ENCOUNTER — Ambulatory Visit: Payer: Self-pay | Admitting: Licensed Clinical Social Worker

## 2023-12-06 NOTE — Telephone Encounter (Signed)
 Please call patient and schedule follow up within 1 week at our office

## 2023-12-06 NOTE — Patient Outreach (Signed)
 Care Management  Transitions of Care Program Transitions of Care Post-discharge week 2/ day # 4   12/06/2023 Name: Heather Grant  MRN: [id number] DOB: Apr 02, 1941  Subjective: Heather Grant  is a 83 y.o. year old female who is a primary care patient of Rollene Almarie LABOR, MD. The Care Management team Engaged with patient Engaged with patient's daughter/ caregiver [name] by telephone to assess and address transitions of care needs.   Consent to Services:  Patient was given information about care management services, agreed to services, and gave verbal consent to participate.   Enrolled 12/02/23  Assessment: received voice message from patient's daughter [name]; returned her call within 45 minutes; she shares that she has noticed that patient's stool is dark black; she denies specific clinical issues, states that patient tells me she feels fine confirmed [name] plans to take patient to ID provider office visit tomorrow- confirmed ID provider had previously informed PCP that lab work would be completed at that time -- provided education around signs/ symptoms to monitor for that indicate acute blood loss- daughter denies all today- encouraged her to continue monitoring and provided education around action plan should signs/ symptoms develop  -- confirmed home health team visited patient's home yesterday- home health team contacted on-call doctor re: elevated blood pressure readings during home visit: patient's PCP has since sent her office team a request to call/ schedule office visit- encouraged [name] to listen out for this call: she verbalizes understanding and states she will do -- confirmed that daughter/ patient have completed call with LCSW as scheduled earlier today:  today, they reports patient has decided to remain at home since her gout has improved          SDOH Interventions    Flowsheet Row Telephone from 12/02/2023 in [place] [facility] ED  to Hosp-Admission (Discharged) from 11/12/2023 in [facility]  SURGICAL Care Coordination from 08/03/2023 in [facility] Care Coordination Care Coordination from 06/29/2023 in [facility] Care Coordination Care Coordination from 06/16/2023 in [facility] Care Coordination Care Coordination from 05/11/2023 in [facility] Coordination  SDOH Interventions        Food Insecurity Interventions Intervention Not Indicated -- -- Intervention Not Indicated -- --  Housing Interventions Intervention Not Indicated -- -- -- Intervention Not Indicated --  Transportation Interventions Intervention Not Indicated  [daughter reports today that she and other family provide transportation,  states she also has some kinf od transportation service, but I don't know the details about that] Inpatient TOC Intervention Not Indicated  [patient now has SCAT and Medicaid Transportation] -- -- SCAT Psychologist, Clinical), Payor Benefit  [assisted her with SCAT application,  she will also call to connect with Medicaid Transporation]  Utilities Interventions Intervention Not Indicated -- -- -- Intervention Not Indicated --        Goals Addressed             This Visit's Progress    TOC 30-day Program Care Plan   On track    Current Barriers:  Medication management daughters currently managing medications; patient was previously independent in medication management; daughters need support/ coaching around medications post-recent hospital discharge Provider appointments confirmed had HFU office visit with PCP on 11/30/23- [facility] LCSW referral completed for possible SNF- rehabilitation placement: however- verified patient already has established LCSW active in her care [name])-- will message Rolin accordingly to update  Home Health services  ordered at time of hospital discharge for PT/  RN: [facility]; daughter reports on 12/02/23 she has not yet heard from [facility]- daughter confirms she has phone number for home health agency [phone]) and will call them today to inquire about initiation of services: made daughter [name] aware that patient's phone number had full voice mail box with TOC outreaches last week: [name] will follow up to ensure voice mail is cleared and will request home health agency to call her with updates- this was encouraged  Equipment/DME daughter/ patient report that patient obtained and is using rollator walker as ordered post-hospital discharge Hospitalization 12/13-- 29/2024:  discharged to home/ self care after refusing SNF- rehabilitation post-discharge; unfortunately, patient experienced acute gout flare post-discharge requiring ED visit on 11/29/23- she was prescribed prednisone  and was discharged home from ED to self-care Elderly patient- independent prior to recent hospitalization; now requiring assistance with self-care at home due to ongoing/ acute gout flare; reports supportive local daughters who are able to assist in care needs: patient requested that I contact daughter [name] at [phone] for weekly TOC 30-day program outreaches  RNCM Clinical Goal(s):  Patient will work with the Care Management team over the next 30 days to address Transition of Care Barriers: Medication Management Support at home Provider appointments Home Health services Equipment/DME take all medications exactly as prescribed and will call provider for medication related questions as evidenced by caregiver/ patient reporting during RN CM weekly outreaches with daughter [name]- per patient request attend all scheduled medical appointments: 12/06/23- LCSW telephone visit- LCSW currently established as evidenced by collaboration with LCSW team as indicated during [facility] 30-day program work with home health agency/ team to initiate/ participate in home health services as  evidenced by patient/ caregiver reporting/ collaboration with home health agency as indicated during RN CM TOC 30-day program weekly outreaches not experience hospital admission as evidenced by review of EMR. Hospital Admissions in last 6 months = 1 Verbalize appropriate support at home for care needs  through collaboration with RN Care manager, provider, and care team.   Interventions: Evaluation of current treatment plan related to  self management and patient's adherence to plan as established by provider  Transitions of Care:  Goal on track:  Yes. 12/03/23  12/06/23: received voice message from patient's daughter [name]; returned her call within 45 minutes; she shares that she has noticed that patient's stool is dark black; she denies specific clinical issues, states that patient tells me she feels fine confirmed [name] plans to take patient to ID provider office visit tomorrow- confirmed ID provider had previously informed PCP that lab work would be completed at that time -- provided education around signs/ symptoms to monitor for that indicate acute blood loss- daughter denies all today- encouraged her to continue monitoring and provided education around action plan should signs/ symptoms develop  -- confirmed home health team visited patient's home yesterday- home health team contacted on-call doctor re: elevated blood pressure readings during home visit: patient's PCP has since sent her office team a request to call/ schedule office visit- encouraged [name] to listen out for this call: she verbalizes understanding and states she will do -- confirmed that daughter/ patient have completed call with LCSW as scheduled earlier today:  today, they reports patient has decided to remain at home since her gout has improved  12/03/23: received voice message from patient's daughter [name], requesting call back; contacted daughter back- she is asking about lab values/ results from HFU PCP office  visit: specifically WBC:  reviewed results with daughter; explained that office staff had apparently contacted patient with results and left voice message re: increased WBC: read the notes from PCP to the ID specialist, and shared his recommendation that patient remain on antibiotics as per discharge instructions; [name] confirmed that patient is taking the antibiotics as instructed; she confirms she will be attending ID HFU office visit with patient on 12/07/23; she also confirms today that she did contact [facility] home health agency and the initiation of services visit will be completed/ is scheduled for Sunday 12/05/23; daughter denies further questions/ concerns today   Durable Medical Equipment (DME) needs assessed with patient/caregiver Doctor Visits  - discussed the importance of doctor visits Communication with established LCSW Rolin Kerns re: new (duplicate) referral from PCP on 11/30/23-- updated LCSW re: possible need for SNF placement for rehabilitation, per patient report on 11/30/23 to PCP; today, on 12/02/23- patient and daughter are not sure this will be necessary, given prednisone  is helping to reduce symptoms from patient's post-discharge gout flare  Contacted provider for patient needs PCP- to confirm patient has established LCSW active in her care Post discharge activity limitations prescribed by provider reviewed Discussed current clinical condition with  patient and subsequently with her daughter:  They both report that patient's post-discharge gout flare has improved and patient is able to ambulate in her home using walker; she is currently staying in her ground level apartment alone; family is checking in and providing care/ support 3 or 4 times a day; patient and daughter [name] both deny specific clinical concerns today Daughter confirms  patient is taking prednisone  for acute gout flare as instructed post-ED visit on 11/29/23- however- she tells me patient has NOT been  taking COLCHICINE  every day as instructed by EDP 11/29/23 and by PCP on 11/30/23: advised daughter to resume giving this medication every day as instructed during both post-discharge visits-- daughter verbalizes understanding and agreement, states she will do Provided education around expected/ usual side effects of antibiotics and importance of taking concurrent pro-biotics as instructed: [name] tells me patient is taking both antibiotic and probiotic as instructed Confirmed uses assistive devices on regular basis, post-recent hospital discharge: rolling walker Role of home health services with importance of participation/ ongoing engagement: daughter has not yet heard from home health agency; daughter is currently driving during [facility] call- she confirms she has contact information for home health agency and will call as soon as we hang up; to initiate home health services as ordered at time of hospital discharge on 11/28/23; encouraged daughter to ensure that patient's voice mail box is cleared to receive new messages; encouraged [name] to make herself as primary contact person for medical affairs- as she proactively tells me she is the best contact for patient and manages all aspects of her medical care at home Provided education around benefit of conservative post-hospital discharge activity; need to pace activity without over-doing  Reviewed ED visit on 11/29/23 and PCP provider office visit on 11/30/23 with patient's daughter- other than need to continue colchicine , as noted above-- caregiver verbalizes good understanding of same; denies questions/ concerns post-recent provider office visits Reviewed upcoming provider office visit to ID provider 12/07/23: confirmed patient/ caregiver is aware of and has plans to attend as scheduled   Patient Goals/Self-Care Activities: Participate in Transition of Care Program/Attend [facility] scheduled calls Take all medications as prescribed Attend all scheduled  provider appointments Call provider office for new concerns or questions  Continue pacing activity as your recuperation from your recent  gout flare continues Use assistive devices as needed to prevent falls- your new rolling walker Your daughter [name] is going to contact the home health team that was referred at the time of your hospital discharge; please make sure that this is completed Please stay in touch with the social worker that has been calling you Monique)  Follow Up Plan:  Telephone follow up appointment with care management team member scheduled for:  scheduled with  patient's daughter [name] on Thursday 12/09/23 at 9:30 am          Plan: Telephone follow up appointment with care management team member scheduled for:  Thursday 12/09/23 at 9:30 am  Beatris Blinda Lawrence, RN, BSN, CCRN Alumnus RN Care Manager  Transitions of Care  [facility] - [facility] [phone]: direct office

## 2023-12-07 ENCOUNTER — Ambulatory Visit (INDEPENDENT_AMBULATORY_CARE_PROVIDER_SITE_OTHER): Payer: 59 | Admitting: Internal Medicine

## 2023-12-07 ENCOUNTER — Other Ambulatory Visit: Payer: Self-pay

## 2023-12-07 ENCOUNTER — Encounter: Payer: Self-pay | Admitting: Internal Medicine

## 2023-12-07 VITALS — BP 178/74 | HR 63 | Temp 97.0°F

## 2023-12-07 DIAGNOSIS — N719 Inflammatory disease of uterus, unspecified: Secondary | ICD-10-CM

## 2023-12-07 DIAGNOSIS — K921 Melena: Secondary | ICD-10-CM | POA: Diagnosis not present

## 2023-12-07 DIAGNOSIS — N824 Other female intestinal-genital tract fistulae: Secondary | ICD-10-CM

## 2023-12-07 NOTE — Patient Instructions (Signed)
 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

## 2023-12-07 NOTE — Patient Instructions (Signed)
 Visit Information  Thank you for taking time to visit with me today. Please don't hesitate to contact me if I can be of assistance to you.   Following are the goals we discussed today:   Goals Addressed             This Visit's Progress    Obtain Supportive Resources   On track    Activities and task to complete in order to accomplish goals.   Keep all upcoming appointments discussed today Continue with compliance of taking medication prescribed by Doctor Implement healthy coping skills discussed to assist with management of symptoms F/up with PCP office regarding appt to address BP          Our next appointment is by telephone on 1/21 at 11:30 AM  Please call the care guide team at [phone] if you need to cancel or reschedule your appointment.   If you are experiencing a Mental Health or Behavioral Health Crisis or need someone to talk to, please call the Suicide and Crisis Lifeline: 988 call 911   The patient verbalized understanding of instructions, educational materials, and care plan provided today and DECLINED offer to receive copy of patient instructions, educational materials, and care plan.   Aaira Oestreicher, MSW, LCSW [facility] Care Management [place]  [facility] [email or web address] Phone [phone] 4:51 PM

## 2023-12-07 NOTE — Patient Outreach (Signed)
  Care Coordination   Follow Up Visit Note   12/06/2023 Name: Heather Grant  MRN: [id number] DOB: 09-26-1941  Heather Grant  is a 83 y.o. year old female who sees Rollene Almarie LABOR, MD for primary care. I spoke with  Heather LABOR Klinck  and adult daughter, Heather Grant by phone today.  What matters to the patients health and wellness today?  Symptom Management, Level of Care, Upcoming appts    Goals Addressed             This Visit's Progress    Obtain Supportive Resources   On track    Activities and task to complete in order to accomplish goals.   Keep all upcoming appointments discussed today Continue with compliance of taking medication prescribed by Doctor Implement healthy coping skills discussed to assist with management of symptoms F/up with PCP office regarding appt to address BP          SDOH assessments and interventions completed:  No     Care Coordination Interventions:  Yes, provided  Interventions Today    Flowsheet Row Most Recent Value  Chronic Disease   Chronic disease during today's visit Other  [Gout]  General Interventions   General Interventions Discussed/Reviewed General Interventions Reviewed, Doctor Visits, Level of Care, Communication with  Doctor Visits Discussed/Reviewed Doctor Visits Reviewed, Specialist  [GI appt scheduled for 01/30, earliest appt available]  Communication with PCP/Specialists  [Family are concerned about black stools that were first noticed on 12/05/23. Family would like advisement, has contacted [facility] RNCM. Would like to request appt with PCP to address BP to be in approx 2 weeks]  Level of Care --  [facility] has begaun through [facility]. Patient will participate in PT and OT. Nurse visited 12/05/23]  Mental Health Interventions   Mental Health Discussed/Reviewed Mental Health Reviewed, Coping Strategies  Pharmacy Interventions   Pharmacy Dicussed/Reviewed Pharmacy Topics Reviewed, Medication Adherence  [Patient has  resumed BP meds]  Safety Interventions   Safety Discussed/Reviewed Safety Reviewed  [Strong support from adult daughters]       Follow up plan: Follow up call scheduled for 2-4 weeks    Encounter Outcome:  Patient Visit Completed   Rolin Kerns, MSW, LCSW [facility] Care Management [facility]  [facility] [email or web address] Phone [phone] 4:51 PM
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
## 2023-12-07 NOTE — Telephone Encounter (Signed)
 Pts daughter wanted to know if it is ok to do a virtual visit instead due to patient having a few other appointments coming up?

## 2023-12-07 NOTE — Telephone Encounter (Signed)
 She will need BP check so virtual is likely not appropriate.

## 2023-12-08 ENCOUNTER — Telehealth: Payer: Self-pay

## 2023-12-08 LAB — COMPLETE METABOLIC PANEL WITH GFR
AG Ratio: 1.4 (calc) (ref 1.0–2.5)
ALT: 76 U/L — ABNORMAL HIGH (ref 6–29)
AST: 43 U/L — ABNORMAL HIGH (ref 10–35)
Albumin: 3.4 g/dL — ABNORMAL LOW (ref 3.6–5.1)
Alkaline phosphatase (APISO): 63 U/L (ref 37–153)
BUN/Creatinine Ratio: 18 (calc) (ref 6–22)
BUN: 19 mg/dL (ref 7–25)
CO2: 28 mmol/L (ref 20–32)
Calcium: 9.6 mg/dL (ref 8.6–10.4)
Chloride: 106 mmol/L (ref 98–110)
Creat: 1.04 mg/dL — ABNORMAL HIGH (ref 0.60–0.95)
Globulin: 2.5 g/dL (ref 1.9–3.7)
Glucose, Bld: 116 mg/dL — ABNORMAL HIGH (ref 65–99)
Potassium: 4.9 mmol/L (ref 3.5–5.3)
Sodium: 141 mmol/L (ref 135–146)
Total Bilirubin: 0.4 mg/dL (ref 0.2–1.2)
Total Protein: 5.9 g/dL — ABNORMAL LOW (ref 6.1–8.1)
eGFR: 54 mL/min/{1.73_m2} — ABNORMAL LOW (ref >=60)

## 2023-12-08 LAB — C-REACTIVE PROTEIN: CRP: <3 mg/L (ref <8.0)

## 2023-12-08 LAB — CBC
HCT: 37.4 % (ref 35.0–45.0)
Hemoglobin: 12 g/dL (ref 11.7–15.5)
MCH: 30.9 pg (ref 27.0–33.0)
MCHC: 32.1 g/dL (ref 32.0–36.0)
MCV: 96.4 fL (ref 80.0–100.0)
MPV: 10.1 fL (ref 7.5–12.5)
Platelets: 465 10*3/uL — ABNORMAL HIGH (ref 140–400)
RBC: 3.88 10*6/uL (ref 3.80–5.10)
RDW: 13.5 % (ref 11.0–15.0)
WBC: 5.4 10*3/uL (ref 3.8–10.8)

## 2023-12-08 NOTE — Telephone Encounter (Signed)
-----   Message from Mora sent at 12/08/2023 10:39 AM EST ----- Hi team Her crp and leukocytosis had normalize  Could you call daughter and let her know patient can stop antibiotics Patient's daughter Pascual) would like to be contacted for lab results [phone])  See me in a couple weeks as discussed  thanks

## 2023-12-08 NOTE — Telephone Encounter (Signed)
 Spoke with patient's daughter and relayed lab results. States she saw results on mychart and is worried with abnormal values.  Is scheduled for follow up on 1/22. Juanita Laster, RMA

## 2023-12-09 ENCOUNTER — Inpatient Hospital Stay: Payer: 59 | Admitting: Internal Medicine

## 2023-12-09 ENCOUNTER — Telehealth: Payer: Self-pay | Admitting: Internal Medicine

## 2023-12-09 ENCOUNTER — Other Ambulatory Visit: Payer: Self-pay | Admitting: *Deleted

## 2023-12-09 DIAGNOSIS — D72829 Elevated white blood cell count, unspecified: Secondary | ICD-10-CM | POA: Diagnosis not present

## 2023-12-09 DIAGNOSIS — Z792 Long term (current) use of antibiotics: Secondary | ICD-10-CM | POA: Diagnosis not present

## 2023-12-09 DIAGNOSIS — Z7951 Long term (current) use of inhaled steroids: Secondary | ICD-10-CM | POA: Diagnosis not present

## 2023-12-09 DIAGNOSIS — Z9981 Dependence on supplemental oxygen: Secondary | ICD-10-CM | POA: Diagnosis not present

## 2023-12-09 DIAGNOSIS — G43909 Migraine, unspecified, not intractable, without status migrainosus: Secondary | ICD-10-CM | POA: Diagnosis not present

## 2023-12-09 DIAGNOSIS — D649 Anemia, unspecified: Secondary | ICD-10-CM | POA: Diagnosis not present

## 2023-12-09 DIAGNOSIS — J4540 Moderate persistent asthma, uncomplicated: Secondary | ICD-10-CM | POA: Diagnosis not present

## 2023-12-09 DIAGNOSIS — I480 Paroxysmal atrial fibrillation: Secondary | ICD-10-CM | POA: Diagnosis not present

## 2023-12-09 DIAGNOSIS — I10 Essential (primary) hypertension: Secondary | ICD-10-CM | POA: Diagnosis not present

## 2023-12-09 DIAGNOSIS — E785 Hyperlipidemia, unspecified: Secondary | ICD-10-CM | POA: Diagnosis not present

## 2023-12-09 DIAGNOSIS — M19041 Primary osteoarthritis, right hand: Secondary | ICD-10-CM | POA: Diagnosis not present

## 2023-12-09 DIAGNOSIS — M1990 Unspecified osteoarthritis, unspecified site: Secondary | ICD-10-CM | POA: Diagnosis not present

## 2023-12-09 DIAGNOSIS — E860 Dehydration: Secondary | ICD-10-CM | POA: Diagnosis not present

## 2023-12-09 DIAGNOSIS — G4733 Obstructive sleep apnea (adult) (pediatric): Secondary | ICD-10-CM | POA: Diagnosis not present

## 2023-12-09 DIAGNOSIS — M19042 Primary osteoarthritis, left hand: Secondary | ICD-10-CM | POA: Diagnosis not present

## 2023-12-09 DIAGNOSIS — M170 Bilateral primary osteoarthritis of knee: Secondary | ICD-10-CM | POA: Diagnosis not present

## 2023-12-09 DIAGNOSIS — M109 Gout, unspecified: Secondary | ICD-10-CM | POA: Diagnosis not present

## 2023-12-09 DIAGNOSIS — N3000 Acute cystitis without hematuria: Secondary | ICD-10-CM | POA: Diagnosis not present

## 2023-12-09 NOTE — Patient Instructions (Signed)
 Visit Information  Thank you for taking time to visit with me today. Please don't hesitate to contact me if I can be of assistance to you before our next scheduled telephone appointment.  Our next appointment is by telephone on Friday 12/17/23 at 2:30 pm  Please call the care guide team at [phone] if you need to cancel or reschedule your appointment.   Following are the goals we discussed today:  Patient Goals/Self-Care Activities: Participate in Transition of Care Program/Attend TOC scheduled calls Take all medications as prescribed Attend all scheduled provider appointments Call provider office for new concerns or questions  Continue pacing activity as your recuperation from your recent hospitalization/ gout flare continues: stay as active as you are able, but do not over-do activity Use assistive devices as needed to prevent falls- your new rolling walker Please stay in touch with the social worker that has been calling you [facility])   If you are experiencing a Mental Health or Behavioral Health Crisis or need someone to talk to, please  call the Suicide and Crisis Lifeline: 988 call the [place]  National Suicide Prevention Lifeline: (210)096-7716 or TTY: 9494489527 TTY 480-754-9064) to talk to a trained counselor call 1-800-273-TALK (toll free, 24 hour hotline) go to [facility] [address] [place] [id number]) call the [facility] Crisis Line: (814)033-3533 call 911   Patient verbalizes understanding of instructions and care plan provided today and agrees to view in MyChart. Active MyChart status and patient's daughter/ caregiver understanding of how to access instructions and care plan via MyChart confirmed with patient.     Zameria Vogl Mckinney Dandrea Medders, RN, BSN, Media Planner  Transitions of Care  [facility] - [facility] [phone]: direct office

## 2023-12-09 NOTE — Telephone Encounter (Signed)
 Copied from CRM [id number]. Topic: Clinical - Home Health Verbal Orders >> Dec 09, 2023  2:15 PM Burnard DEL wrote: Caller/Agency: [facility] Callback Number: [phone] Service Requested: Occupational Therapy Frequency: 1wk6 Any new concerns about the patient? Yes Her b/p 160/80,which was a little high for patient

## 2023-12-09 NOTE — Patient Outreach (Signed)
 Care Management  Transitions of Care Program Transitions of Care Post-discharge week 2/ day # 7   12/09/2023 Name: Heather Grant  MRN: [id number] DOB: May 28, 1941  Subjective: Heather Grant  is a 83 y.o. year old female who is a primary care patient of Rollene Almarie LABOR, MD. The Care Management team Engaged with patient Engaged with patient's daughter/ caregiver by telephone to assess and address transitions of care needs.   Consent to Services:  Patient was given information about care management services, agreed to services, and gave verbal consent to participate.  Enrolled 12/02/23  Assessment: per [name]/ daughter- caregiver:  We went to the ID doctor and they have called to tell us  she can stop taking the antibiotics-- I told them I still had other questions about her blood work, so I am waiting for a call back about that.  She is eating well and sleeping well.  There are still some black color in her stools, but that is not as bad as it was.  She is using her walker and the home health team are coming in for regular visits- they are coming today for OT.  Her gout is much better, no more problems there.  We found her blood pressure cuff here at home and are checking her blood pressures- they have all been normal; today it was 128/65.  She has not been needing to use her home oxygen  very much at all; she uses her inhalers and that takes care of any shortness of breath she has, which is not very often.    Daughter/ caregiver denies specific clinical concerns and reports patient is better; not in any distress; patient intermittently participates in telephone call today and sounds to be in no distress throughout [facility] 30-day program outreach          SDOH Interventions    Flowsheet Row Telephone from 12/02/2023 in [place] [facility] DEPARTMENT ED to Hosp-Admission (Discharged) from 11/12/2023 in [facility]  SURGICAL Care Coordination from  08/03/2023 in [facility] Community Care Coordination Care Coordination from 06/29/2023 in [facility] Community Care Coordination Care Coordination from 06/16/2023 in [facility] Community Care Coordination Care Coordination from 05/11/2023 in [facility] Care Coordination  SDOH Interventions        Food Insecurity Interventions Intervention Not Indicated -- -- Intervention Not Indicated -- --  Housing Interventions Intervention Not Indicated -- -- -- Intervention Not Indicated --  Transportation Interventions Intervention Not Indicated  [daughter reports today that she and other family provide transportation,  states she also has some kinf od transportation service, but I don't know the details about that] Inpatient TOC Intervention Not Indicated  [patient now has SCAT and Medicaid Transportation] -- -- SCAT Psychologist, Clinical), Payor Benefit  [assisted her with SCAT application,  she will also call to connect with Medicaid Transporation]  Utilities Interventions Intervention Not Indicated -- -- -- Intervention Not Indicated --        Goals Addressed             This Visit's Progress    TOC 30-day Program Care Plan   On track    Current Barriers:  Medication management daughters currently managing medications; patient was previously independent in medication management; daughters need support/ coaching around medications post-recent hospital discharge Provider appointments confirmed had HFU office visit with PCP on 11/30/23- [facility]BCI LCSW referral completed for possible SNF- rehabilitation placement: however- verified patient already has established  LCSW active in her care [name])-- will message Rolin accordingly to update  Home Health services ordered at time of hospital discharge for PT/ RN: [facility]; daughter reports on 12/02/23 she has not yet heard from [facility]- daughter confirms she has phone  number for home health agency [phone]) and will call them today to inquire about initiation of services: made daughter [name] aware that patient's phone number had full voice mail box with TOC outreaches last week: [name] will follow up to ensure voice mail is cleared and will request home health agency to call her with updates- this was encouraged  Equipment/DME daughter/ patient report that patient obtained and is using rollator walker as ordered post-hospital discharge Hospitalization 12/13-- 29/2024:  discharged to home/ self care after refusing SNF- rehabilitation post-discharge; unfortunately, patient experienced acute gout flare post-discharge requiring ED visit on 11/29/23- she was prescribed prednisone  and was discharged home from ED to self-care Elderly patient- independent prior to recent hospitalization; now requiring assistance with self-care at home due to ongoing/ acute gout flare; reports supportive local daughters who are able to assist in care needs: patient requested that I contact daughter [name] at [phone] for weekly TOC 30-day program outreaches  RNCM Clinical Goal(s):  Patient will work with the Care Management team over the next 30 days to address Transition of Care Barriers: Medication Management Support at home Provider appointments Home Health services Equipment/DME take all medications exactly as prescribed and will call provider for medication related questions as evidenced by caregiver/ patient reporting during RN CM weekly outreaches with daughter [name]- per patient request attend all scheduled medical appointments: 12/06/23- LCSW telephone visit- LCSW currently established as evidenced by collaboration with LCSW team as indicated during [facility] 30-day program work with home health agency/ team to initiate/ participate in home health services as evidenced by patient/ caregiver reporting/ collaboration with home health agency as indicated during RN CM TOC 30-day  program weekly outreaches not experience hospital admission as evidenced by review of EMR. Hospital Admissions in last 6 months = 1 Verbalize appropriate support at home for care needs  through collaboration with RN Care manager, provider, and care team.   Interventions: Evaluation of current treatment plan related to  self management and patient's adherence to plan as established by provider  Transitions of Care:  Goal on track:  Yes. 12/09/23  Durable Medical Equipment (DME) needs assessed with patient/caregiver Doctor Visits  - discussed the importance of doctor visits Post discharge activity limitations prescribed by provider reviewed Discussed current clinical condition with [name]/ daughter:  We went to the ID doctor and they have called to tell us  she can stop taking the antibiotics-- I told them I still had other questions about her blood work, so I am waiting for a call back about that.  She is eating well and sleeping well.  There are still some black color in her stools, but that is not as bad as it was.  She is using her walker and the home health team are coming in for regular visits- they are coming today for OT.  Her gout is much better, no more problems there.  We found her blood pressure cuff here at home and are checking her blood pressures- they have all been normal; today it was 128/65.  She has not been needing to use her home oxygen  very much at all; she uses her inhalers and that takes care of any shortness of breath she has, which is not very often.   Confirmed  uses assistive devices on regular basis, post-recent hospital discharge: rolling walker Reviewed recent ID provider office visit on 12/07/23- daughter verbalizes very good understanding of post-visit instructions; she has questions around specific lab values: these questions were addressed, however, explained that specific direction around abnormal lab values should be addressed by provider team- [name] confirms she  has asked for additional explanation from ID provider; she will also discuss with onc/ gyn provider at time of upcoming scheduled visit on 12/17/23- encouraged daughter to schedule with PCP for additional explanation/ guidance around abnormal lab values, as needed/ indicated- she verbalizes understanding/ agreement Reviewed upcoming scheduled appointments: onc/ gyn provider 12/17/23; ID provider- virtual visit on 12/22/23; GI provider- 12/30/23: confirmed [name] is aware of all and has plans to attend as scheduled Reinforced role of home health services with importance of participation/ ongoing engagement: daughter reports good understanding of same and confirms home health services are currently active Reinforced previously provided education around benefit of conservative post-hospital discharge activity; need to pace activity without over-doing   Patient Goals/Self-Care Activities: Participate in Transition of Care Program/Attend TOC scheduled calls Take all medications as prescribed Attend all scheduled provider appointments Call provider office for new concerns or questions  Continue pacing activity as your recuperation from your recent hospitalization/ gout flare continues: stay as active as you are able, but do not over-do activity Use assistive devices as needed to prevent falls- your new rolling walker Please stay in touch with the social worker that has been calling you Monique)  Follow Up Plan:  Telephone follow up appointment with care management team member scheduled for:  scheduled with  patient's daughter [name] on Thursday 12/17/23 at 2:30 pm          Plan: Telephone follow up appointment with care management team member scheduled for:  Thursday 12/17/23 at 2:30 pm  Beatris Blinda Lawrence, RN, BSN, CCRN Alumnus RN Care Manager  Transitions of Care  [facility] - [facility] [phone]: direct office

## 2023-12-13 NOTE — Telephone Encounter (Signed)
 Ok for orders?

## 2023-12-14 ENCOUNTER — Encounter: Payer: Self-pay | Admitting: Gynecologic Oncology

## 2023-12-14 NOTE — Telephone Encounter (Signed)
 Called and gave ok verbals

## 2023-12-14 NOTE — Telephone Encounter (Signed)
 Copied from CRM [id number]. Topic: Clinical - Home Health Verbal Orders >> Dec 14, 2023  4:24 PM Thersia BROCKS wrote: Jon faxed over a physician order but no one has signed it yet, wanted to get a verbal okay , skilled nurse 2 week 1 1 week 3 and every other week , medication teaching and disease management and pt and ot , is requesting a callback at [phone]

## 2023-12-15 NOTE — Telephone Encounter (Signed)
 Ok for [employer]

## 2023-12-15 NOTE — Telephone Encounter (Signed)
 I have not received a physical fax regarding this but here are the verbals

## 2023-12-16 NOTE — Telephone Encounter (Signed)
Called secued VS and lvm for verbal orders

## 2023-12-17 ENCOUNTER — Other Ambulatory Visit: Payer: Self-pay | Admitting: *Deleted

## 2023-12-17 ENCOUNTER — Inpatient Hospital Stay: Payer: 59 | Attending: Gynecologic Oncology | Admitting: Gynecologic Oncology

## 2023-12-17 ENCOUNTER — Encounter: Payer: Self-pay | Admitting: Gynecologic Oncology

## 2023-12-17 VITALS — BP 171/68 | HR 71 | Temp 98.6°F | Resp 20 | Wt 165.0 lb

## 2023-12-17 DIAGNOSIS — D649 Anemia, unspecified: Secondary | ICD-10-CM | POA: Diagnosis not present

## 2023-12-17 DIAGNOSIS — M19041 Primary osteoarthritis, right hand: Secondary | ICD-10-CM | POA: Diagnosis not present

## 2023-12-17 DIAGNOSIS — E785 Hyperlipidemia, unspecified: Secondary | ICD-10-CM | POA: Diagnosis not present

## 2023-12-17 DIAGNOSIS — N719 Inflammatory disease of uterus, unspecified: Secondary | ICD-10-CM | POA: Diagnosis present

## 2023-12-17 DIAGNOSIS — N824 Other female intestinal-genital tract fistulae: Secondary | ICD-10-CM | POA: Diagnosis not present

## 2023-12-17 DIAGNOSIS — M19042 Primary osteoarthritis, left hand: Secondary | ICD-10-CM | POA: Diagnosis not present

## 2023-12-17 DIAGNOSIS — G4733 Obstructive sleep apnea (adult) (pediatric): Secondary | ICD-10-CM | POA: Diagnosis not present

## 2023-12-17 DIAGNOSIS — M1990 Unspecified osteoarthritis, unspecified site: Secondary | ICD-10-CM | POA: Diagnosis not present

## 2023-12-17 DIAGNOSIS — G43909 Migraine, unspecified, not intractable, without status migrainosus: Secondary | ICD-10-CM | POA: Diagnosis not present

## 2023-12-17 DIAGNOSIS — Z792 Long term (current) use of antibiotics: Secondary | ICD-10-CM | POA: Diagnosis not present

## 2023-12-17 DIAGNOSIS — M109 Gout, unspecified: Secondary | ICD-10-CM | POA: Diagnosis not present

## 2023-12-17 DIAGNOSIS — J4540 Moderate persistent asthma, uncomplicated: Secondary | ICD-10-CM | POA: Diagnosis not present

## 2023-12-17 DIAGNOSIS — D72829 Elevated white blood cell count, unspecified: Secondary | ICD-10-CM | POA: Diagnosis not present

## 2023-12-17 DIAGNOSIS — E860 Dehydration: Secondary | ICD-10-CM | POA: Diagnosis not present

## 2023-12-17 DIAGNOSIS — I480 Paroxysmal atrial fibrillation: Secondary | ICD-10-CM | POA: Diagnosis not present

## 2023-12-17 DIAGNOSIS — Z7951 Long term (current) use of inhaled steroids: Secondary | ICD-10-CM | POA: Diagnosis not present

## 2023-12-17 DIAGNOSIS — M170 Bilateral primary osteoarthritis of knee: Secondary | ICD-10-CM | POA: Diagnosis not present

## 2023-12-17 DIAGNOSIS — I10 Essential (primary) hypertension: Secondary | ICD-10-CM | POA: Diagnosis not present

## 2023-12-17 DIAGNOSIS — N3000 Acute cystitis without hematuria: Secondary | ICD-10-CM | POA: Diagnosis not present

## 2023-12-17 DIAGNOSIS — Z9981 Dependence on supplemental oxygen: Secondary | ICD-10-CM | POA: Diagnosis not present

## 2023-12-17 NOTE — Patient Instructions (Signed)
It was good to see you today.  I will let you know when I get your ultrasound results back in February.  We will have another discussion after you meet with Dr. Cliffton Asters and we know whether surgery is recommended for your colon.
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
## 2023-12-17 NOTE — Patient Outreach (Signed)
Care Management  Transitions of Care Program Transitions of Care Post-discharge week 3/ day # 15   12/17/2023 Name: Heather Grant MRN: [id number] DOB: 07-20-1941  Subjective: Heather Grant is a 83 y.o. year old female who is a primary care patient of Myrlene Broker, MD. The Care Management team Engaged with patient Engaged with patient's daughter/ caregiver Heather Grant by telephone to assess and address transitions of care needs.   Consent to Services:  Patient was given information about care management services, agreed to services, and gave verbal consent to participate.  Enrolled 12/02/23  Assessment: per caregiver Heather Grant/ daughter:  "She has had a good week and everything is calm and under control.  We went to see the gynecology doctor today- they will do an ultrasound on February 17, and then go from there.  But no changes to the overall plan at this point.  No changes to her medications.  She is coping with everything just fine.  She is doing fine staying at her apartment on her own- we are checking in and seeing her every day.  Nothing major to report today.  We have lots of more doctor appointments coming up, and I will taking her to those and attending the appointment with her."  Caregiver denies specific clinical concerns today; reports patient "doing very well"          SDOH Interventions    Flowsheet Row Telephone from 12/02/2023 in [facility] ED to Hosp-Admission (Discharged) from 11/12/2023 in [facility]  SURGICAL Care Coordination from 08/03/2023 in [facility] Care Coordination from 06/29/2023 in [facility] Care Coordination from 06/16/2023 in [facility] Care Coordination Care Coordination from 05/11/2023 in [facility] Coordination  SDOH Interventions        Food Insecurity  Interventions Intervention Not Indicated -- -- Intervention Not Indicated -- --  Housing Interventions Intervention Not Indicated -- -- -- Intervention Not Indicated --  Transportation Interventions Intervention Not Indicated  [daughter reports today that she and other family provide transportation,  states she "also has some kinf od transportation service, but I don't know the details about that"] Inpatient TOC Intervention Not Indicated  [patient now has SCAT and Medicaid Transportation] -- -- SCAT Psychologist, clinical), Payor Benefit  [assisted her with SCAT application,  she will also call to connect with Medicaid Transporation]  Utilities Interventions Intervention Not Indicated -- -- -- Intervention Not Indicated --        Goals Addressed             This Visit's Progress    TOC 30-day Program Care Plan   On track    Current Barriers:  Medication management daughters currently managing medications; patient was previously independent in medication management; daughters need support/ coaching around medications post-recent hospital discharge Provider appointments confirmed had HFU office visit with PCP on 11/30/23- [facility] LCSW referral completed for possible SNF- rehabilitation placement: however- verified patient already has established LCSW active in her care (Jenel Lucks)-- will message Leavy Cella accordingly to update  Home Health services ordered at time of hospital discharge for PT/ RN: [facility]; daughter reports on 12/02/23 she has not yet heard from [facility]- daughter confirms she has phone number for home health agency [phone]) and will call them today to inquire about initiation of services: made daughter Heather Grant aware that patient's phone number had full voice mail box with TOC  outreaches last week: Heather Grant will follow up to ensure voice mail is cleared and will request home health agency to call her with updates- this was encouraged  Equipment/DME  daughter/ patient report that patient obtained and is using rollator walker as ordered post-hospital discharge Hospitalization 12/13-- 29/2024:  discharged to home/ self care after refusing SNF- rehabilitation post-discharge; unfortunately, patient experienced acute gout flare post-discharge requiring ED visit on 11/29/23- she was prescribed prednisone and was discharged home from ED to self-care Elderly patient- independent prior to recent hospitalization; now requiring assistance with self-care at home due to ongoing/ acute gout flare; reports supportive local daughters who are able to assist in care needs: patient requested that I contact daughter Heather Grant at [phone] for weekly TOC 30-day program outreaches  RNCM Clinical Goal(s):  Patient will work with the Care Management team over the next 30 days to address Transition of Care Barriers: Medication Management Support at home Provider appointments Home Health services Equipment/DME take all medications exactly as prescribed and will call provider for medication related questions as evidenced by caregiver/ patient reporting during RN CM weekly outreaches with daughter Heather Grant- per patient request attend all scheduled medical appointments: 12/06/23- LCSW telephone visit- LCSW currently established as evidenced by collaboration with LCSW team as indicated during [facility] 30-day program work with home health agency/ team to initiate/ participate in home health services as evidenced by patient/ caregiver reporting/ collaboration with home health agency as indicated during RN CM TOC 30-day program weekly outreaches not experience hospital admission as evidenced by review of EMR. Hospital Admissions in last 6 months = 1 Verbalize appropriate support at home for care needs  through collaboration with RN Care manager, provider, and care team.   Interventions: Evaluation of current treatment plan related to  self management and patient's adherence to plan  as established by provider  Transitions of Care:  Goal on track:  Yes. 12/17/23  Durable Medical Equipment (DME) needs assessed with patient/caregiver Doctor Visits  - discussed the importance of doctor visits Post discharge activity limitations prescribed by provider reviewed Discussed current clinical condition with Heather Grant/ daughter:  "She has had a good week and everything is calm and under control.  We went to see the gynecology doctor today- they will do an ultrasound on February 17, and then go from there.  But no changes to the overall plan at this point.  No changes to her medications.  She is coping with everything just fine.  She is doing fine staying at her apartment on her own- we are checking in and seeing her every day.  Nothing major to report today.  We have lots of more doctor appointments coming up, and I will taking her to those and attending the appointment with her." Confirmed uses assistive devices on regular basis, post-recent hospital discharge: rolling walker: reports she may call insurance company to determine if they will cover a transfer chair to assist with patient bathing/ showering- this was encouraged- provided education that if insurance will cover, to be sure to ask PCP to write order- she verbalizes understanding/ states will do Reviewed recent gynecology provider office visit earlier today on 12/17/23- daughter verbalizes very good understanding of post-visit instructions; denies questions/ concerns Reviewed upcoming scheduled appointments: ID provider- follow up visit on 12/22/23; PCP- follow up visit 12/24/23; GI provider- 12/30/23; surgical provider 12/23/23; pelvic ultrasound 01/17/24: confirmed Heather Grant is aware of all and has plans to transport/ attend with patient as scheduled Reinforced role of home health services with importance of  participation/ ongoing engagement: daughter reports good understanding of same and confirms home health services are currently  active Reinforced previously provided education around benefit of conservative post-hospital discharge activity; need to pace activity without over-doing   Patient Goals/Self-Care Activities: Participate in Transition of Care Program/Attend TOC scheduled calls Take all medications as prescribed Attend all scheduled provider appointments Call provider office for new concerns or questions  Continue pacing activity as your recuperation from your recent hospitalization continues: stay as active as you are able, but do not over-do activity Use assistive devices as needed to prevent falls- your new rolling walker; consider calling your insurance provider to determine if they will cover transfer board for bathing purposes- if so- be sure to ask your Primary Care Doctor to write an order for this equipment Please stay in touch with the social worker that has been calling you Leavy Cella)  Follow Up Plan:  Telephone follow up appointment with care management team member scheduled for:  scheduled with  patient's daughter Heather Grant on Thursday 12/24/23 at 2:30 pm          Plan: Telephone follow up appointment with care management team member scheduled for:   Thursday 12/24/23 at 2:30 pm  Caryl Pina, RN, BSN, CCRN Alumnus RN Care Manager  Transitions of Care  [facility] - Population Health  [place] [phone]: direct office

## 2023-12-17 NOTE — Patient Instructions (Signed)
Visit Information  Thank you for taking time to visit with me today. Please don't hesitate to contact me if I can be of assistance to you before our next scheduled telephone appointment.  Our next appointment is by telephone on Thursday 12/23/23 at 2:30 pm  Please call the care guide team at [phone] if you need to cancel or reschedule your appointment.   Following are the goals we discussed today:  Patient Goals/Self-Care Activities: Participate in Transition of Care Program/Attend TOC scheduled calls Take all medications as prescribed Attend all scheduled provider appointments Call provider office for new concerns or questions  Continue pacing activity as your recuperation from your recent hospitalization continues: stay as active as you are able, but do not over-do activity Use assistive devices as needed to prevent falls- your new rolling walker; consider calling your insurance provider to determine if they will cover transfer board for bathing purposes- if so- be sure to ask your Primary Care Doctor to write an order for this equipment Please stay in touch with the social worker that has been calling you Leavy Cella)  If you are experiencing a Mental Health or Behavioral Health Crisis or need someone to talk to, please  call the Suicide and Crisis Lifeline: 988 call the [place] National Suicide Prevention Lifeline: 226-281-8602 or TTY: (857) 772-6194 TTY (215)085-1906) to talk to a trained counselor call 1-800-273-TALK (toll free, 24 hour hotline) go to [facility] [address] [place] [phone]) call the [facility] Crisis Line: (340)[phone] call 911   The patient's daughter/ caregiver verbalized understanding of instructions, educational materials, and care plan provided today and DECLINED offer to receive copy of patient instructions, educational materials, and care plan.   Caryl Pina, RN, BSN, Financial risk analyst  Transitions of Care  [facility] [phone]: direct office

## 2023-12-20 ENCOUNTER — Encounter: Payer: Self-pay | Admitting: Internal Medicine

## 2023-12-21 ENCOUNTER — Ambulatory Visit: Payer: Self-pay | Admitting: Licensed Clinical Social Worker

## 2023-12-21 NOTE — Patient Instructions (Signed)
Visit Information  Thank you for taking time to visit with me today. Please don't hesitate to contact me if I can be of assistance to you.   Following are the goals we discussed today:   Goals Addressed             This Visit's Progress    Obtain Supportive Resources   On track    Activities and task to complete in order to accomplish goals.   Keep all upcoming appointments discussed today Continue with compliance of taking medication prescribed by Doctor Implement healthy coping skills discussed to assist with management of symptoms F/up with GI about stool concerns          Our next appointment is by telephone on 2/18 at 11 AM  Please call the care guide team at [phone] if you need to cancel or reschedule your appointment.   If you are experiencing a Mental Health or Behavioral Health Crisis or need someone to talk to, please call the Suicide and Crisis Lifeline: 988 call 911   Patient verbalizes understanding of instructions and care plan provided today and agrees to view in MyChart. Active MyChart status and patient understanding of how to access instructions and care plan via MyChart confirmed with patient.     Windy Fast [facility]  [facility], [facility] Clinical Social Worker Direct Dial: [phone]  Fax: [phone] Website: [email or web address] 5:11 PM

## 2023-12-21 NOTE — Patient Outreach (Signed)
  Care Coordination   Follow Up Visit Note   12/21/2023 Name: Heather Grant MRN: [id number] DOB: 04/22/1941  Heather Grant is a 83 y.o. year old female who sees Heather Broker, MD for primary care. I spoke with  Heather Grant's daughter, Heather Grant by phone today.  What matters to the patients health and wellness today?  Symptom Management and Upcoming Specialist appts    Goals Addressed             This Visit's Progress    Obtain Supportive Resources   On track    Activities and task to complete in order to accomplish goals.   Keep all upcoming appointments discussed today Continue with compliance of taking medication prescribed by Doctor Implement healthy coping skills discussed to assist with management of symptoms F/up with GI about stool concerns          SDOH assessments and interventions completed:  No     Care Coordination Interventions:  Yes, provided  Interventions Today    Flowsheet Row Most Recent Value  General Interventions   General Interventions Discussed/Reviewed General Interventions Reviewed, Doctor Visits  Doctor Visits Discussed/Reviewed Doctor Visits Reviewed  Mental Health Interventions   Mental Health Discussed/Reviewed Mental Health Reviewed       Follow up plan: Follow up call scheduled for 1-3 weeks    Encounter Outcome:  Patient Visit Completed   Heather Lucks, LCSW [facility], [facility] Clinical Social Worker Direct Dial: [phone]  Fax: [phone] Website: [email or web address] 5:10 PM

## 2023-12-22 ENCOUNTER — Other Ambulatory Visit: Payer: Self-pay

## 2023-12-22 ENCOUNTER — Encounter: Payer: Self-pay | Admitting: Internal Medicine

## 2023-12-22 ENCOUNTER — Ambulatory Visit (INDEPENDENT_AMBULATORY_CARE_PROVIDER_SITE_OTHER): Payer: 59 | Admitting: Internal Medicine

## 2023-12-22 VITALS — BP 143/77 | HR 68 | Temp 96.9°F

## 2023-12-22 DIAGNOSIS — N824 Other female intestinal-genital tract fistulae: Secondary | ICD-10-CM | POA: Diagnosis not present

## 2023-12-22 NOTE — Patient Instructions (Signed)
You are doing well   Will repeat a set of labs today   Video visit with me in around 5-6 weeks  Convert video visit to inperson on that day if you feels unwell, having abdominal pain, vaginal discharge, or fever, chill   Cancel the visit all together if you feel well or better than today
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
## 2023-12-23 ENCOUNTER — Telehealth: Payer: Self-pay | Admitting: Internal Medicine

## 2023-12-23 ENCOUNTER — Other Ambulatory Visit: Payer: Self-pay | Admitting: *Deleted

## 2023-12-23 ENCOUNTER — Telehealth: Payer: Self-pay

## 2023-12-23 DIAGNOSIS — Z7951 Long term (current) use of inhaled steroids: Secondary | ICD-10-CM | POA: Diagnosis not present

## 2023-12-23 DIAGNOSIS — Z9981 Dependence on supplemental oxygen: Secondary | ICD-10-CM | POA: Diagnosis not present

## 2023-12-23 DIAGNOSIS — N3000 Acute cystitis without hematuria: Secondary | ICD-10-CM | POA: Diagnosis not present

## 2023-12-23 DIAGNOSIS — G4733 Obstructive sleep apnea (adult) (pediatric): Secondary | ICD-10-CM | POA: Diagnosis not present

## 2023-12-23 DIAGNOSIS — E785 Hyperlipidemia, unspecified: Secondary | ICD-10-CM | POA: Diagnosis not present

## 2023-12-23 DIAGNOSIS — M1990 Unspecified osteoarthritis, unspecified site: Secondary | ICD-10-CM | POA: Diagnosis not present

## 2023-12-23 DIAGNOSIS — I10 Essential (primary) hypertension: Secondary | ICD-10-CM | POA: Diagnosis not present

## 2023-12-23 DIAGNOSIS — Z792 Long term (current) use of antibiotics: Secondary | ICD-10-CM | POA: Diagnosis not present

## 2023-12-23 DIAGNOSIS — G43909 Migraine, unspecified, not intractable, without status migrainosus: Secondary | ICD-10-CM | POA: Diagnosis not present

## 2023-12-23 DIAGNOSIS — E860 Dehydration: Secondary | ICD-10-CM | POA: Diagnosis not present

## 2023-12-23 DIAGNOSIS — M19042 Primary osteoarthritis, left hand: Secondary | ICD-10-CM | POA: Diagnosis not present

## 2023-12-23 DIAGNOSIS — D72829 Elevated white blood cell count, unspecified: Secondary | ICD-10-CM | POA: Diagnosis not present

## 2023-12-23 DIAGNOSIS — D649 Anemia, unspecified: Secondary | ICD-10-CM | POA: Diagnosis not present

## 2023-12-23 DIAGNOSIS — M170 Bilateral primary osteoarthritis of knee: Secondary | ICD-10-CM | POA: Diagnosis not present

## 2023-12-23 DIAGNOSIS — J4540 Moderate persistent asthma, uncomplicated: Secondary | ICD-10-CM | POA: Diagnosis not present

## 2023-12-23 DIAGNOSIS — M19041 Primary osteoarthritis, right hand: Secondary | ICD-10-CM | POA: Diagnosis not present

## 2023-12-23 DIAGNOSIS — I480 Paroxysmal atrial fibrillation: Secondary | ICD-10-CM | POA: Diagnosis not present

## 2023-12-23 DIAGNOSIS — M109 Gout, unspecified: Secondary | ICD-10-CM | POA: Diagnosis not present

## 2023-12-23 LAB — CBC
HCT: 38.2 % (ref 35.0–45.0)
Hemoglobin: 12.2 g/dL (ref 11.7–15.5)
MCH: 31.1 pg (ref 27.0–33.0)
MCHC: 31.9 g/dL — ABNORMAL LOW (ref 32.0–36.0)
MCV: 97.4 fL (ref 80.0–100.0)
MPV: 10.3 fL (ref 7.5–12.5)
Platelets: 267 10*3/uL (ref 140–400)
RBC: 3.92 10*6/uL (ref 3.80–5.10)
RDW: 13.4 % (ref 11.0–15.0)
WBC: 4.8 10*3/uL (ref 3.8–10.8)

## 2023-12-23 LAB — COMPLETE METABOLIC PANEL WITH GFR
AG Ratio: 1.6 (calc) (ref 1.0–2.5)
ALT: 18 U/L (ref 6–29)
AST: 18 U/L (ref 10–35)
Albumin: 3.9 g/dL (ref 3.6–5.1)
Alkaline phosphatase (APISO): 53 U/L (ref 37–153)
BUN/Creatinine Ratio: 8 (calc) (ref 6–22)
BUN: 8 mg/dL (ref 7–25)
CO2: 28 mmol/L (ref 20–32)
Calcium: 10.2 mg/dL (ref 8.6–10.4)
Chloride: 107 mmol/L (ref 98–110)
Creat: 1.02 mg/dL — ABNORMAL HIGH (ref 0.60–0.95)
Globulin: 2.5 g/dL (ref 1.9–3.7)
Glucose, Bld: 94 mg/dL (ref 65–99)
Potassium: 4.1 mmol/L (ref 3.5–5.3)
Sodium: 142 mmol/L (ref 135–146)
Total Bilirubin: 0.5 mg/dL (ref 0.2–1.2)
Total Protein: 6.4 g/dL (ref 6.1–8.1)
eGFR: 56 mL/min/{1.73_m2} — ABNORMAL LOW (ref >=60)

## 2023-12-23 LAB — C-REACTIVE PROTEIN: CRP: <3 mg/L (ref <8.0)

## 2023-12-23 NOTE — Telephone Encounter (Signed)
Copied from CRM [id number]. Topic: Appointments - Appointment Cancel/Reschedule >> Dec 23, 2023  8:00 AM Theodis Sato wrote: Patients daughter is requesting to called back to re-schedule the PATIENT OUTREACH appointment for today 1/23 at 2:30 Please call patients daughter [name] back to reschedule this at [phone]

## 2023-12-23 NOTE — Telephone Encounter (Signed)
-----   Message from Heather Grant sent at 12/23/2023 10:54 AM EST ----- Hi team  Please let her know that her labs are basically normal/baseline  No sign of any lab to suggest ongoing infection thanks

## 2023-12-23 NOTE — Patient Outreach (Signed)
Care Management  Transitions of Care Program Transitions of Care Post-discharge week 4/ day # 21   12/23/2023 Name: Heather Grant MRN: [id number] DOB: [birth year]  Subjective: Heather Grant is a 83 y.o. year old female who is a primary care patient of Myrlene Broker, MD. The Care Management team Engaged with patient Engaged with patient's daughter/ caregiver by telephone to assess and address transitions of care needs.   Consent to Services:  Patient was given information about care management services, agreed to services, and gave verbal consent to participate.  Enrolled 12/02/23  Assessment: Per daughter/ caregiver Heather Grant:  "She has been doing fine since we spoke last week; we went to the infectious disease doctor yesterday, got a good report, no changes.  Home Health is coming in regularly- they will be here today and she is working with them like she is supposed to.  No more issues with gout; and the blackness in her stools has backed off-- it's not there everyday- just every now and then.  Still using her home oxygen and not having any problems with it.  We are still checking on her every day and things are going much better and very well."    Caregiver denies specific clinical concerns today and reports everything going well with patient's care at home          SDOH Interventions    Flowsheet Row Telephone from 12/02/2023 in [place] [facility] ED to Hosp-Admission (Discharged) from 11/12/2023 in [facility]  SURGICAL Care Coordination from 08/03/2023 in [facility] Coord[facility] Care Coordination from 06/29/2023 in [facility] Care Coordination Care Coordination from 06/16/2023 in [facility] Care Coordination Care Coordination from 05/11/2023 in [facility] Coordination  SDOH Interventions        Food Insecurity Interventions  Intervention Not Indicated -- -- Intervention Not Indicated -- --  Housing Interventions Intervention Not Indicated -- -- -- Intervention Not Indicated --  Transportation Interventions Intervention Not Indicated  [daughter reports today that she and other family provide transportation,  states she "also has some kinf od transportation service, but I don't know the details about that"] Inpatient TOC Intervention Not Indicated  [patient now has SCAT and Medicaid Transportation] -- -- SCAT Psychologist, clinical), Payor Benefit  [assisted her with SCAT application,  she will also call to connect with Medicaid Transporation]  Utilities Interventions Intervention Not Indicated -- -- -- Intervention Not Indicated --        Goals Addressed             This Visit's Progress    TOC 30-day Program Care Plan   On track    Current Barriers:  Medication management daughters currently managing medications; patient was previously independent in medication management; daughters need support/ coaching around medications post-recent hospital discharge Provider appointments confirmed had HFU office visit with PCP on 11/30/23- [facility] LCSW referral completed for possible SNF- rehabilitation placement: however- verified patient already has established LCSW active in her care (Heather Grant)-- will message Heather Grant accordingly to update  Home Health services ordered at time of hospital discharge for PT/ RN: [facility]; daughter reports on 12/02/23 she has not yet heard from [facility]- daughter confirms she has phone number for home health agency [phone]) and will call them today to inquire about initiation of services: made daughter Heather Grant aware that patient's phone number had full voice mail box with TOC outreaches last  week: Heather Grant will follow up to ensure voice mail is cleared and will request home health agency to call her with updates- this was encouraged  Equipment/DME daughter/ patient  report that patient obtained and is using rollator walker as ordered post-hospital discharge Hospitalization 12/13-- 29/2024:  discharged to home/ self care after refusing SNF- rehabilitation post-discharge; unfortunately, patient experienced acute gout flare post-discharge requiring ED visit on 11/29/23- she was prescribed prednisone and was discharged home from ED to self-care Elderly patient- independent prior to recent hospitalization; now requiring assistance with self-care at home due to ongoing/ acute gout flare; reports supportive local daughters who are able to assist in care needs: patient requested that I contact daughter Heather Grant at [phone] for weekly TOC 30-day program outreaches  RNCM Clinical Goal(s):  Patient will work with the Care Management team over the next 30 days to address Transition of Care Barriers: Medication Management Support at home Provider appointments Home Health services Equipment/DME take all medications exactly as prescribed and will call provider for medication related questions as evidenced by caregiver/ patient reporting during RN CM weekly outreaches with daughter Heather Grant- per patient request attend all scheduled medical appointments: 12/06/23- LCSW telephone visit- LCSW currently established as evidenced by collaboration with LCSW team as indicated during [facility] 30-day program work with home health agency/ team to initiate/ participate in home health services as evidenced by patient/ caregiver reporting/ collaboration with home health agency as indicated during RN CM TOC 30-day program weekly outreaches not experience hospital admission as evidenced by review of EMR. Hospital Admissions in last 6 months = 1 Verbalize appropriate support at home for care needs  through collaboration with RN Care manager, provider, and care team.   Interventions: Evaluation of current treatment plan related to  self management and patient's adherence to plan as established by  provider  Transitions of Care:  Goal on track:  Yes. 12/23/23  Durable Medical Equipment (DME) needs assessed with patient/caregiver Doctor Visits  - discussed the importance of doctor visits Discussed current clinical condition with [name]/ daughter:  "She has been doing fine since we spoke last week; we went to the infectious disease doctor yesterday, got a good report, no changes.  Home Health is coming in regularly- they will be here today and she is working with them like she is supposed to.  No more issues with gout; and the blackness in her stools has backed off-- it's not there everyday- just every now and then.  Still using her home oxygen and not having any problems with it.  We are still checking on her every day and things are going much better and very well."  Caregiver denies specific clinical concerns today and reports everything going well with patient's care at home Reviewed recent infectious disease provider office visit on 12/22/23- daughter verbalizes very good understanding of post-visit instructions; denies questions/ concerns; confirms no changes to current plan of care/ medications Reviewed upcoming scheduled appointments: PCP- follow up visit 12/24/23; GI provider- 12/30/23; pelvic ultrasound 01/17/24: confirmed Heather Grant is aware of all and has plans to transport/ attend with patient as scheduled Reinforced role of home health services with importance of participation/ ongoing engagement: daughter reports good understanding of same and confirms home health services are currently active; reports patient engaging well with home health PT/ OT services: positive reinforcement provided- encouraged patient's ongoing engagement with home health team as long as they are active Reinforced previously provided education around benefit of conservative post-hospital discharge activity; need to pace activity without over-doing  Patient Goals/Self-Care Activities: Participate in Transition of  Care Program/Attend TOC scheduled calls Take all medications as prescribed Attend all scheduled provider appointments Call provider office for new concerns or questions  Continue pacing activity as your recuperation from your recent hospitalization continues: stay as active as you are able, but do not over-do activity Use assistive devices as needed to prevent falls- your rolling walker; consider calling your insurance provider to determine if they will cover transfer board for bathing purposes- if so- be sure to ask your Primary Care Doctor to write an order for this equipment Please stay in touch with the social worker that has been calling you Heather Grant)  Follow Up Plan:  Telephone follow up appointment with care management team member scheduled for:  scheduled with  patient's daughter Heather Grant on Friday 12/31/23 at 3:00 pm          Plan: Telephone follow up appointment with care management team member scheduled for:   Friday 12/31/23 at 3:00 pm  Caryl Pina, RN, BSN, CCRN Alumnus RN Care Manager  Transitions of Care  [facility] - [facility] [phone]: direct office

## 2023-12-23 NOTE — Patient Instructions (Signed)
Visit Information  Thank you for taking time to visit with me today. Please don't hesitate to contact me if I can be of assistance to you before our next scheduled telephone appointment.  Our next appointment is by telephone on Friday 12/31/23 at 3:00 pm  Please call the care guide team at [phone] if you need to cancel or reschedule your appointment.   Following are the goals we discussed today:  Patient Goals/Self-Care Activities: Participate in Transition of Care Program/Attend TOC scheduled calls Take all medications as prescribed Attend all scheduled provider appointments Call provider office for new concerns or questions  Continue pacing activity as your recuperation from your recent hospitalization continues: stay as active as you are able, but do not over-do activity Use assistive devices as needed to prevent falls- your rolling walker; consider calling your insurance provider to determine if they will cover transfer board for bathing purposes- if so- be sure to ask your Primary Care Doctor to write an order for this equipment Please stay in touch with the social worker that has been calling you Leavy Cella)  If you are experiencing a Mental Health or Behavioral Health Crisis or need someone to talk to, please  call the Suicide and Crisis Lifeline: 988 call the [place] National Suicide Prevention Lifeline: 504-092-5771 or TTY: 939-426-0287 TTY 6693071938) to talk to a trained counselor call 1-800-273-TALK (toll free, 24 hour hotline) go to [facility] [address] [place] [phone]) call the [facility] Crisis Line: [phone] call 911   Patient's caregiver verbalizes understanding of instructions and care plan provided today and agrees to view in MyChart. Active MyChart status and patient understanding of how to access instructions and care plan via MyChart confirmed with patient.     Caryl Pina, RN, BSN, Marketing executive  Transitions of Care  [facility] - [facility] [phone]: direct office

## 2023-12-24 ENCOUNTER — Encounter: Payer: Self-pay | Admitting: Internal Medicine

## 2023-12-24 ENCOUNTER — Ambulatory Visit (INDEPENDENT_AMBULATORY_CARE_PROVIDER_SITE_OTHER): Payer: 59 | Admitting: Internal Medicine

## 2023-12-24 VITALS — BP 144/84 | HR 66 | Temp 98.4°F | Ht 59.0 in | Wt 159.0 lb

## 2023-12-24 DIAGNOSIS — N824 Other female intestinal-genital tract fistulae: Secondary | ICD-10-CM | POA: Diagnosis not present

## 2023-12-24 DIAGNOSIS — M19041 Primary osteoarthritis, right hand: Secondary | ICD-10-CM | POA: Diagnosis not present

## 2023-12-24 DIAGNOSIS — Z7951 Long term (current) use of inhaled steroids: Secondary | ICD-10-CM | POA: Diagnosis not present

## 2023-12-24 DIAGNOSIS — M150 Primary generalized (osteo)arthritis: Secondary | ICD-10-CM | POA: Diagnosis not present

## 2023-12-24 DIAGNOSIS — M19042 Primary osteoarthritis, left hand: Secondary | ICD-10-CM | POA: Diagnosis not present

## 2023-12-24 DIAGNOSIS — D649 Anemia, unspecified: Secondary | ICD-10-CM | POA: Diagnosis not present

## 2023-12-24 DIAGNOSIS — G43909 Migraine, unspecified, not intractable, without status migrainosus: Secondary | ICD-10-CM | POA: Diagnosis not present

## 2023-12-24 DIAGNOSIS — G4733 Obstructive sleep apnea (adult) (pediatric): Secondary | ICD-10-CM | POA: Diagnosis not present

## 2023-12-24 DIAGNOSIS — J4540 Moderate persistent asthma, uncomplicated: Secondary | ICD-10-CM | POA: Diagnosis not present

## 2023-12-24 DIAGNOSIS — I480 Paroxysmal atrial fibrillation: Secondary | ICD-10-CM | POA: Diagnosis not present

## 2023-12-24 DIAGNOSIS — M109 Gout, unspecified: Secondary | ICD-10-CM | POA: Diagnosis not present

## 2023-12-24 DIAGNOSIS — N3000 Acute cystitis without hematuria: Secondary | ICD-10-CM | POA: Diagnosis not present

## 2023-12-24 DIAGNOSIS — Z792 Long term (current) use of antibiotics: Secondary | ICD-10-CM | POA: Diagnosis not present

## 2023-12-24 DIAGNOSIS — M170 Bilateral primary osteoarthritis of knee: Secondary | ICD-10-CM | POA: Diagnosis not present

## 2023-12-24 DIAGNOSIS — D72829 Elevated white blood cell count, unspecified: Secondary | ICD-10-CM | POA: Diagnosis not present

## 2023-12-24 DIAGNOSIS — I10 Essential (primary) hypertension: Secondary | ICD-10-CM | POA: Diagnosis not present

## 2023-12-24 DIAGNOSIS — Z9981 Dependence on supplemental oxygen: Secondary | ICD-10-CM | POA: Diagnosis not present

## 2023-12-24 DIAGNOSIS — E860 Dehydration: Secondary | ICD-10-CM | POA: Diagnosis not present

## 2023-12-24 DIAGNOSIS — M1990 Unspecified osteoarthritis, unspecified site: Secondary | ICD-10-CM | POA: Diagnosis not present

## 2023-12-24 DIAGNOSIS — E785 Hyperlipidemia, unspecified: Secondary | ICD-10-CM | POA: Diagnosis not present

## 2023-12-24 MED ORDER — TRAMADOL HCL 50 MG PO TABS: 50.0000 mg | ORAL_TABLET | Freq: Two times a day (BID) | ORAL | 0 refills | Status: AC | PRN

## 2023-12-24 NOTE — Assessment & Plan Note (Signed)
BP close to goal on hydralazine 25 mg TID. Will still hold metoprolol 25 mg BID due to low end of normal Hr and BP close to goal. She had recent labs which were reviewed with her. No indication for change in treatment.

## 2023-12-24 NOTE — Patient Instructions (Signed)
We will send in tramadol which you can use for pain up to twice a day as needed.

## 2023-12-24 NOTE — Assessment & Plan Note (Signed)
Worsening right knee pain. Rx tramadol 50 mg BID prn for pain. 5 day script done today. Advised she would need to follow up with [place] with a phone call or mychart message on if this is working and how often she is taking it. I did not feel comfortable prescribing her oxycodone without trying alternatives first.

## 2023-12-24 NOTE — Assessment & Plan Note (Signed)
Counseled on labs recently consistent with clearing of infection. She follows up with surgeon mid Feb.

## 2023-12-24 NOTE — Progress Notes (Signed)
   Subjective:   Patient ID: Heather Grant, female    DOB: 02-06-1941, 83 y.o.   MRN: [id number]  HPI The patient is an 83 YO female coming in for follow up BP after resuming hydralazine 25 mg TID. Denies side effects. BP slightly variable at home but has decreased significantly. Labs recently with ID would like to review. Is having worsening right knee pain and took oxycodone in hospital and was discharged with some would like this or something for pain.  Review of Systems  Constitutional: Negative.   HENT: Negative.    Eyes: Negative.   Respiratory:  Negative for cough, chest tightness and shortness of breath.   Cardiovascular:  Negative for chest pain, palpitations and leg swelling.  Gastrointestinal:  Negative for abdominal distention, abdominal pain, constipation, diarrhea, nausea and vomiting.  Musculoskeletal:  Positive for arthralgias and gait problem.  Skin: Negative.   Psychiatric/Behavioral: Negative.      Objective:  Physical Exam Constitutional:      Appearance: She is well-developed.  HENT:     Head: Normocephalic and atraumatic.  Cardiovascular:     Rate and Rhythm: Normal rate and regular rhythm.  Pulmonary:     Effort: Pulmonary effort is normal. No respiratory distress.     Breath sounds: Normal breath sounds. No wheezing or rales.  Abdominal:     General: Bowel sounds are normal. There is no distension.     Palpations: Abdomen is soft.     Tenderness: There is no abdominal tenderness. There is no rebound.  Musculoskeletal:        General: Tenderness present.     Cervical back: Normal range of motion.  Skin:    General: Skin is warm and dry.  Neurological:     Mental Status: She is alert and oriented to person, place, and time.     Coordination: Coordination abnormal.     Comments: wheelchair     Vitals:   12/24/23 1018 12/24/23 1025  BP: (!) 144/84 (!) 144/84  Pulse: 66   Temp: 98.4 F (36.9 C)   TempSrc: Oral   SpO2: 98%   Weight: 159 lb  (72.1 kg)   Height: 4\' 11"  (1.499 m)     Assessment & Plan:

## 2023-12-25 ENCOUNTER — Other Ambulatory Visit: Payer: Self-pay | Admitting: Internal Medicine

## 2023-12-27 ENCOUNTER — Other Ambulatory Visit: Payer: Self-pay

## 2023-12-27 ENCOUNTER — Encounter: Payer: Self-pay | Admitting: Internal Medicine

## 2023-12-27 ENCOUNTER — Other Ambulatory Visit: Payer: Self-pay | Admitting: Internal Medicine

## 2023-12-27 MED ORDER — HYDRALAZINE HCL 25 MG PO TABS: 25.0000 mg | ORAL_TABLET | Freq: Three times a day (TID) | ORAL | 0 refills | Status: DC

## 2023-12-27 NOTE — Telephone Encounter (Signed)
Patient is needing her medication asap she needs a pa done she needs the medication [facility]

## 2023-12-27 NOTE — Telephone Encounter (Signed)
Copied from CRM [id number]. Topic: Clinical - Prescription Issue >> Dec 27, 2023 12:23 PM Alona Bene A wrote: Reason for CRM: Patient called in regarding hydrALAZINE (APRESOLINE) 25 MG tablet [Pharmacy Med Name: hydrALAZINE HCl 25 MG Oral Tablet]. Patient was seen in office and was advised it was being sent over the same day for refill. Please contact patient to advise of update on medication and if it has been sent over.

## 2023-12-28 DIAGNOSIS — M170 Bilateral primary osteoarthritis of knee: Secondary | ICD-10-CM | POA: Diagnosis not present

## 2023-12-28 DIAGNOSIS — G4733 Obstructive sleep apnea (adult) (pediatric): Secondary | ICD-10-CM | POA: Diagnosis not present

## 2023-12-28 DIAGNOSIS — G43909 Migraine, unspecified, not intractable, without status migrainosus: Secondary | ICD-10-CM | POA: Diagnosis not present

## 2023-12-28 DIAGNOSIS — Z7951 Long term (current) use of inhaled steroids: Secondary | ICD-10-CM | POA: Diagnosis not present

## 2023-12-28 DIAGNOSIS — E860 Dehydration: Secondary | ICD-10-CM | POA: Diagnosis not present

## 2023-12-28 DIAGNOSIS — I10 Essential (primary) hypertension: Secondary | ICD-10-CM | POA: Diagnosis not present

## 2023-12-28 DIAGNOSIS — E785 Hyperlipidemia, unspecified: Secondary | ICD-10-CM | POA: Diagnosis not present

## 2023-12-28 DIAGNOSIS — M109 Gout, unspecified: Secondary | ICD-10-CM | POA: Diagnosis not present

## 2023-12-28 DIAGNOSIS — Z792 Long term (current) use of antibiotics: Secondary | ICD-10-CM | POA: Diagnosis not present

## 2023-12-28 DIAGNOSIS — I480 Paroxysmal atrial fibrillation: Secondary | ICD-10-CM | POA: Diagnosis not present

## 2023-12-28 DIAGNOSIS — N3000 Acute cystitis without hematuria: Secondary | ICD-10-CM | POA: Diagnosis not present

## 2023-12-28 DIAGNOSIS — D72829 Elevated white blood cell count, unspecified: Secondary | ICD-10-CM | POA: Diagnosis not present

## 2023-12-28 DIAGNOSIS — J4540 Moderate persistent asthma, uncomplicated: Secondary | ICD-10-CM | POA: Diagnosis not present

## 2023-12-28 DIAGNOSIS — M1990 Unspecified osteoarthritis, unspecified site: Secondary | ICD-10-CM | POA: Diagnosis not present

## 2023-12-28 DIAGNOSIS — Z9981 Dependence on supplemental oxygen: Secondary | ICD-10-CM | POA: Diagnosis not present

## 2023-12-28 DIAGNOSIS — M19041 Primary osteoarthritis, right hand: Secondary | ICD-10-CM | POA: Diagnosis not present

## 2023-12-28 DIAGNOSIS — M19042 Primary osteoarthritis, left hand: Secondary | ICD-10-CM | POA: Diagnosis not present

## 2023-12-28 DIAGNOSIS — D649 Anemia, unspecified: Secondary | ICD-10-CM | POA: Diagnosis not present

## 2023-12-29 DIAGNOSIS — I480 Paroxysmal atrial fibrillation: Secondary | ICD-10-CM | POA: Diagnosis not present

## 2023-12-29 DIAGNOSIS — Z9981 Dependence on supplemental oxygen: Secondary | ICD-10-CM | POA: Diagnosis not present

## 2023-12-29 DIAGNOSIS — Z7951 Long term (current) use of inhaled steroids: Secondary | ICD-10-CM | POA: Diagnosis not present

## 2023-12-29 DIAGNOSIS — M170 Bilateral primary osteoarthritis of knee: Secondary | ICD-10-CM | POA: Diagnosis not present

## 2023-12-29 DIAGNOSIS — M19041 Primary osteoarthritis, right hand: Secondary | ICD-10-CM | POA: Diagnosis not present

## 2023-12-29 DIAGNOSIS — N3000 Acute cystitis without hematuria: Secondary | ICD-10-CM | POA: Diagnosis not present

## 2023-12-29 DIAGNOSIS — D72829 Elevated white blood cell count, unspecified: Secondary | ICD-10-CM | POA: Diagnosis not present

## 2023-12-29 DIAGNOSIS — Z792 Long term (current) use of antibiotics: Secondary | ICD-10-CM | POA: Diagnosis not present

## 2023-12-29 DIAGNOSIS — M109 Gout, unspecified: Secondary | ICD-10-CM | POA: Diagnosis not present

## 2023-12-29 DIAGNOSIS — I10 Essential (primary) hypertension: Secondary | ICD-10-CM | POA: Diagnosis not present

## 2023-12-29 DIAGNOSIS — E785 Hyperlipidemia, unspecified: Secondary | ICD-10-CM | POA: Diagnosis not present

## 2023-12-29 DIAGNOSIS — G43909 Migraine, unspecified, not intractable, without status migrainosus: Secondary | ICD-10-CM | POA: Diagnosis not present

## 2023-12-29 DIAGNOSIS — M1990 Unspecified osteoarthritis, unspecified site: Secondary | ICD-10-CM | POA: Diagnosis not present

## 2023-12-29 DIAGNOSIS — E860 Dehydration: Secondary | ICD-10-CM | POA: Diagnosis not present

## 2023-12-29 DIAGNOSIS — J4540 Moderate persistent asthma, uncomplicated: Secondary | ICD-10-CM | POA: Diagnosis not present

## 2023-12-29 DIAGNOSIS — M19042 Primary osteoarthritis, left hand: Secondary | ICD-10-CM | POA: Diagnosis not present

## 2023-12-29 DIAGNOSIS — G4733 Obstructive sleep apnea (adult) (pediatric): Secondary | ICD-10-CM | POA: Diagnosis not present

## 2023-12-29 DIAGNOSIS — D649 Anemia, unspecified: Secondary | ICD-10-CM | POA: Diagnosis not present

## 2023-12-30 ENCOUNTER — Other Ambulatory Visit: Payer: 59

## 2023-12-30 ENCOUNTER — Ambulatory Visit: Payer: 59 | Admitting: Physician Assistant

## 2023-12-30 ENCOUNTER — Encounter: Payer: Self-pay | Admitting: Physician Assistant

## 2023-12-30 VITALS — BP 130/72 | HR 61 | Ht 59.0 in | Wt 155.0 lb

## 2023-12-30 DIAGNOSIS — N3000 Acute cystitis without hematuria: Secondary | ICD-10-CM

## 2023-12-30 DIAGNOSIS — M19042 Primary osteoarthritis, left hand: Secondary | ICD-10-CM

## 2023-12-30 DIAGNOSIS — N824 Other female intestinal-genital tract fistulae: Secondary | ICD-10-CM

## 2023-12-30 DIAGNOSIS — D649 Anemia, unspecified: Secondary | ICD-10-CM

## 2023-12-30 DIAGNOSIS — J4540 Moderate persistent asthma, uncomplicated: Secondary | ICD-10-CM

## 2023-12-30 DIAGNOSIS — N719 Inflammatory disease of uterus, unspecified: Secondary | ICD-10-CM

## 2023-12-30 DIAGNOSIS — K921 Melena: Secondary | ICD-10-CM

## 2023-12-30 DIAGNOSIS — B9620 Unspecified Escherichia coli [E. coli] as the cause of diseases classified elsewhere: Secondary | ICD-10-CM | POA: Diagnosis not present

## 2023-12-30 DIAGNOSIS — I10 Essential (primary) hypertension: Secondary | ICD-10-CM

## 2023-12-30 DIAGNOSIS — D72829 Elevated white blood cell count, unspecified: Secondary | ICD-10-CM

## 2023-12-30 DIAGNOSIS — I480 Paroxysmal atrial fibrillation: Secondary | ICD-10-CM

## 2023-12-30 DIAGNOSIS — M170 Bilateral primary osteoarthritis of knee: Secondary | ICD-10-CM

## 2023-12-30 DIAGNOSIS — E860 Dehydration: Secondary | ICD-10-CM

## 2023-12-30 DIAGNOSIS — M19041 Primary osteoarthritis, right hand: Secondary | ICD-10-CM

## 2023-12-30 LAB — COMPREHENSIVE METABOLIC PANEL
ALT: 13 U/L (ref 0–35)
AST: 18 U/L (ref 0–37)
Albumin: 4.1 g/dL (ref 3.5–5.2)
Alkaline Phosphatase: 56 U/L (ref 39–117)
BUN: 7 mg/dL (ref 6–23)
CO2: 28 meq/L (ref 19–32)
Calcium: 10.4 mg/dL (ref 8.4–10.5)
Chloride: 103 meq/L (ref 96–112)
Creatinine, Ser: 1.09 mg/dL (ref 0.40–1.20)
GFR: 47.86 mL/min — ABNORMAL LOW (ref >60.00)
Glucose, Bld: 79 mg/dL (ref 70–99)
Potassium: 4.1 meq/L (ref 3.5–5.1)
Sodium: 141 meq/L (ref 135–145)
Total Bilirubin: 0.6 mg/dL (ref 0.2–1.2)
Total Protein: 6.5 g/dL (ref 6.0–8.3)

## 2023-12-30 LAB — CBC WITH DIFFERENTIAL/PLATELET
Basophils Absolute: 0.1 10*3/uL (ref 0.0–0.1)
Basophils Relative: 1.1 % (ref 0.0–3.0)
Eosinophils Absolute: 0.2 10*3/uL (ref 0.0–0.7)
Eosinophils Relative: 4 % (ref 0.0–5.0)
HCT: 39.1 % (ref 36.0–46.0)
Hemoglobin: 12.8 g/dL (ref 12.0–15.0)
Lymphocytes Relative: 50.4 % — ABNORMAL HIGH (ref 12.0–46.0)
Lymphs Abs: 2.9 10*3/uL (ref 0.7–4.0)
MCHC: 32.8 g/dL (ref 30.0–36.0)
MCV: 95 fL (ref 78.0–100.0)
Monocytes Absolute: 0.6 10*3/uL (ref 0.1–1.0)
Monocytes Relative: 10.1 % (ref 3.0–12.0)
Neutro Abs: 2 10*3/uL (ref 1.4–7.7)
Neutrophils Relative %: 34.4 % — ABNORMAL LOW (ref 43.0–77.0)
Platelets: 294 10*3/uL (ref 150.0–400.0)
RBC: 4.11 Mil/uL (ref 3.87–5.11)
RDW: 15.4 % (ref 11.5–15.5)
WBC: 5.7 10*3/uL (ref 4.0–10.5)
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
## 2023-12-30 NOTE — Patient Instructions (Addendum)
Your provider has requested that you go to the basement level for lab work before leaving today. Press "B" on the elevator. The lab is located at the first door on the left as you exit the elevator.  Follow the instructions on the Hemoccult cards and mail them back to [place] when you are finished or you may take them directly to the lab in the basement of the [facility] building. We will call you with the results.     _______________________________________________________  If your blood pressure at your visit was 140/90 or greater, please contact your primary care physician to follow up on this.  _______________________________________________________  If you are age 74 or older, your body mass index should be between 23-30. Your Body mass index is 31.31 kg/m. If this is out of the aforementioned range listed, please consider follow up with your Primary Care Provider.  If you are age 8 or younger, your body mass index should be between 19-25. Your Body mass index is 31.31 kg/m. If this is out of the aformentioned range listed, please consider follow up with your Primary Care Provider.   ________________________________________________________  The [facility] providers would like to encourage you to use [facility] to communicate with providers for non-urgent requests or questions.  Due to long hold times on the telephone, sending your provider a message by [facility] may be a faster and more efficient way to get a response.  Please allow 48 business hours for a response.  Please remember that this is for non-urgent requests.  _______________________________________________________   It was a pleasure to see you today!  Thank you for trusting me with your gastrointestinal care!    Hyacinth Meeker, PA-C

## 2023-12-31 ENCOUNTER — Other Ambulatory Visit: Payer: Self-pay | Admitting: *Deleted

## 2023-12-31 NOTE — Patient Outreach (Signed)
  Care Management  Transitions of Care Program Transitions of Care Post-discharge week # 5- day # 29: unsuccessful  12/31/2023 Name: Heather Grant MRN: [id number] DOB: 03-24-1941  Subjective: Heather Grant is a 83 y.o. year old female who is a primary care patient of Myrlene Broker, MD. The Care Management team was unable to reach the patient's daughter/ caregiver by phone to assess and address transitions of care needs.   Left HIPAA compliant voice message requesting call back  Plan: Additional outreach attempts will be made to reach the patient enrolled in the [facility] Program (Post Inpatient/ED Visit).  Caryl Pina, RN, BSN, Media planner  Transitions of Care  [facility] - [facility] [phone]: direct office

## 2024-01-03 ENCOUNTER — Telehealth: Payer: Self-pay | Admitting: *Deleted

## 2024-01-03 ENCOUNTER — Encounter: Payer: Self-pay | Admitting: *Deleted

## 2024-01-03 DIAGNOSIS — J45998 Other asthma: Secondary | ICD-10-CM | POA: Diagnosis not present

## 2024-01-03 DIAGNOSIS — R0602 Shortness of breath: Secondary | ICD-10-CM | POA: Diagnosis not present

## 2024-01-03 NOTE — Patient Instructions (Signed)
Visit Information  Thank you for taking time to visit with me today. Please don't hesitate to contact me if I can be of assistance to you before our next scheduled telephone appointment.  Your next appointment is by telephone on Tuesday 01/18/24 at 1:00 pm  Please call the care guide team at [phone] if you need to cancel or reschedule your appointment.   Following are the goals we discussed today:  Patient Goals/Self-Care Activities: Take all medications as prescribed Attend all scheduled provider appointments Call provider office for new concerns or questions  Continue pacing activity as your recuperation from your recent hospitalization continues: stay as active as you are able, but do not over-do activity Use assistive devices as needed to prevent falls- your rolling walker; consider calling your insurance provider to determine if they will cover transfer board for bathing purposes- if so- be sure to ask your Primary Care Doctor to write an order for this equipment Please stay in touch with the social worker that has been calling you Leavy Cella)   If you are experiencing a Mental Health or Behavioral Health Crisis or need someone to talk to, please  call the Suicide and Crisis Lifeline: 988 call the [place] National Suicide Prevention Lifeline: 445 234 4363 or TTY: 917-570-5060 TTY (289)658-6885) to talk to a trained counselor call 1-800-273-TALK (toll free, 24 hour hotline) go to [facility] [address], [place] [phone]) call the [facility] Crisis Line: 754-115-2242 call 911   Patient verbalizes understanding of instructions and care plan provided today and agrees to view in MyChart. Active MyChart status and patient understanding of how to access instructions and care plan via MyChart confirmed with patient.     Caryl Pina, RN, BSN, Media planner  Transitions of Care  [facility] - [facility] [phone]: direct office

## 2024-01-03 NOTE — Patient Outreach (Signed)
  Care Coordination   ERROR  Visit Note   01/03/2024 Name: [name] MRN: [id number] DOB: 1941/02/16  Heather Grant is a 83 y.o. year old female who sees Myrlene Broker, MD for primary care. I  note created in error  Note created in error- please disregard  Caryl Pina, RN, BSN, CCRN Alumnus RN Care Manager  Transitions of Care  [facility] - [facility] [phone]: direct office

## 2024-01-03 NOTE — Patient Outreach (Signed)
Care Management  Transitions of Care Program Transitions of Care Post-discharge week # 6/ day # 32   01/03/2024 Name: Heather Grant MRN: [id number] DOB: [birth year]  Subjective: Heather Grant is a 83 y.o. year old female who is a primary care patient of Myrlene Broker, MD. The Care Management team Engaged with patient Engaged with patient's daughter/ caregiver by telephone to assess and address transitions of care needs.   Consent to Services:  Patient was given information about care management services, agreed to services, and gave verbal consent to participate.  Enrolled 12/02/23; TOC 30-day case closure 01/03/24: goals met without hospital re-admission  Assessment: per [name]/ daughter- caregiver:  "She has been doing fine; we are out right now.  She got good reports from her doctors and there is nothing new going on.  She will have the colonoscopy soon; and there have been no changes to her medications;"    Caregiver denies specific clinical concerns today and reports everything going well with patient's care at home          SDOH Interventions    Flowsheet Row Office Visit from 12/24/2023 in [facility] [place] Telephone from 12/02/2023 in [place] POPULATION [facility] DEPARTMENT ED to Hosp-Admission (Discharged) from 11/12/2023 in [facility]  SURGICAL Care Coordination from 08/03/2023 in [facility] Care Coordination Care Coordination from 06/29/2023 in [facility] Community Care Coordination Care Coordination from 06/16/2023 in [facility] Community Care Coordination  SDOH Interventions        Food Insecurity Interventions -- Intervention Not Indicated -- -- Intervention Not Indicated --  Housing Interventions -- Intervention Not Indicated -- -- -- Intervention Not Indicated  Transportation Interventions -- Intervention Not Indicated  [daughter reports today that she  and other family provide transportation,  states she "also has some kinf od transportation service, but I don't know the details about that"] Inpatient TOC Intervention Not Indicated  [patient now has SCAT and Medicaid Transportation] -- --  Utilities Interventions -- Intervention Not Indicated -- -- -- Intervention Not Indicated  Depression Interventions/Treatment  Counseling -- -- -- -- --        Goals Addressed             This Visit's Progress    TOC 30-day Program Care Plan   On track    Current Barriers:  Medication management daughters currently managing medications; patient was previously independent in medication management; daughters need support/ coaching around medications post-recent hospital discharge Provider appointments confirmed had HFU office visit with PCP on 11/30/23- [facility] LCSW referral completed for possible SNF- rehabilitation placement: however- verified patient already has established LCSW active in her care (Jenel Lucks)-- will message Leavy Cella accordingly to update  Home Health services ordered at time of hospital discharge for PT/ RN: [facility]; daughter reports on 12/02/23 she has not yet heard from [facility]- daughter confirms she has phone number for home health agency [phone]) and will call them today to inquire about initiation of services: made daughter [name] aware that patient's phone number had full voice mail box with TOC outreaches last week: [name] will follow up to ensure voice mail is cleared and will request home health agency to call her with updates- this was encouraged  Equipment/DME daughter/ patient report that patient obtained and is using rollator walker as ordered post-hospital discharge Hospitalization 12/13-- 29/2024:  discharged to home/ self care after refusing SNF- rehabilitation post-discharge; unfortunately, patient experienced acute gout  flare post-discharge requiring ED visit on 11/29/23- she was prescribed prednisone and was  discharged home from ED to self-care Elderly patient- independent prior to recent hospitalization; now requiring assistance with self-care at home due to ongoing/ acute gout flare; reports supportive local daughters who are able to assist in care needs: patient requested that I contact daughter [name] at [phone] for weekly TOC 30-day program outreaches  RNCM Clinical Goal(s):  Patient will work with the Care Management team over the next 30 days to address Transition of Care Barriers: Medication Management Support at home Provider appointments Home Health services Equipment/DME take all medications exactly as prescribed and will call provider for medication related questions as evidenced by caregiver/ patient reporting during RN CM weekly outreaches with daughter [name]- per patient request attend all scheduled medical appointments: 12/06/23- LCSW telephone visit- LCSW currently established as evidenced by collaboration with LCSW team as indicated during [facility] 30-day program work with home health agency/ team to initiate/ participate in home health services as evidenced by patient/ caregiver reporting/ collaboration with home health agency as indicated during RN CM TOC 30-day program weekly outreaches not experience hospital admission as evidenced by review of EMR. Hospital Admissions in last 6 months = 1 Verbalize appropriate support at home for care needs  through collaboration with RN Care manager, provider, and care team.   Interventions: Evaluation of current treatment plan related to  self management and patient's adherence to plan as established by provider  Transitions of Care:  Goal Met. 01/03/24  Doctor Visits  - discussed the importance of doctor visits Referral to Longitudinal Nurse Case Manager for Ongoing follow-up Discussed current clinical condition with [name]/ daughter:  "She has been doing fine; we are out right now.  She got good reports from her doctors and there is  nothing new going on.  She will have the colonoscopy soon; and there have been no changes to her medications;"  Caregiver denies specific clinical concerns today and reports everything going well with patient's care at home Reviewed recent provider office visits with PCP and GI providers- daughter verbalizes very good understanding of post-visit instructions; denies questions/ concerns; confirms no changes to current plan of care/ medications Reviewed multiple upcoming provider office visits: 2?17/25: ultrasound; 01/18/24: [facility]I LCSW; 01/21/24: gynecological- surgical provider; confirmed patient/ caregiver is aware of all and has plans to attend as scheduled Scheduled for ongoing follow up with longitudinal RN CM for telephone visit on 01/18/24 at 1:00 pm--- patient already active with [facility]BCI LCSW Jasmine  Patient Goals/Self-Care Activities: Take all medications as prescribed Attend all scheduled provider appointments Call provider office for new concerns or questions  Continue pacing activity as your recuperation from your recent hospitalization continues: stay as active as you are able, but do not over-do activity Use assistive devices as needed to prevent falls- your rolling walker; consider calling your insurance provider to determine if they will cover transfer board for bathing purposes- if so- be sure to ask your Primary Care Doctor to write an order for this equipment Please stay in touch with the social worker that has been calling you Leavy Cella)  Follow Up Plan:  Telephone follow up appointment with care management team member scheduled for:  scheduled with longitudinal RN CM 01/18/24 at 1:00 pm          Plan: Telephone follow up appointment with care management team member scheduled for:   01/18/24 at 1:00 pm  Caryl Pina, RN, BSN, Media planner  Transitions of  Care  [facility] - Population Health  [place] [phone]: direct office

## 2024-01-04 DIAGNOSIS — M170 Bilateral primary osteoarthritis of knee: Secondary | ICD-10-CM | POA: Diagnosis not present

## 2024-01-04 DIAGNOSIS — M19041 Primary osteoarthritis, right hand: Secondary | ICD-10-CM | POA: Diagnosis not present

## 2024-01-04 DIAGNOSIS — G43909 Migraine, unspecified, not intractable, without status migrainosus: Secondary | ICD-10-CM | POA: Diagnosis not present

## 2024-01-04 DIAGNOSIS — J4540 Moderate persistent asthma, uncomplicated: Secondary | ICD-10-CM | POA: Diagnosis not present

## 2024-01-04 DIAGNOSIS — M19042 Primary osteoarthritis, left hand: Secondary | ICD-10-CM | POA: Diagnosis not present

## 2024-01-04 DIAGNOSIS — I10 Essential (primary) hypertension: Secondary | ICD-10-CM | POA: Diagnosis not present

## 2024-01-04 DIAGNOSIS — E785 Hyperlipidemia, unspecified: Secondary | ICD-10-CM | POA: Diagnosis not present

## 2024-01-04 DIAGNOSIS — N3000 Acute cystitis without hematuria: Secondary | ICD-10-CM | POA: Diagnosis not present

## 2024-01-04 DIAGNOSIS — D649 Anemia, unspecified: Secondary | ICD-10-CM | POA: Diagnosis not present

## 2024-01-04 DIAGNOSIS — Z7951 Long term (current) use of inhaled steroids: Secondary | ICD-10-CM | POA: Diagnosis not present

## 2024-01-04 DIAGNOSIS — D72829 Elevated white blood cell count, unspecified: Secondary | ICD-10-CM | POA: Diagnosis not present

## 2024-01-04 DIAGNOSIS — Z792 Long term (current) use of antibiotics: Secondary | ICD-10-CM | POA: Diagnosis not present

## 2024-01-04 DIAGNOSIS — Z9981 Dependence on supplemental oxygen: Secondary | ICD-10-CM | POA: Diagnosis not present

## 2024-01-04 DIAGNOSIS — M1990 Unspecified osteoarthritis, unspecified site: Secondary | ICD-10-CM | POA: Diagnosis not present

## 2024-01-04 DIAGNOSIS — E860 Dehydration: Secondary | ICD-10-CM | POA: Diagnosis not present

## 2024-01-04 DIAGNOSIS — M109 Gout, unspecified: Secondary | ICD-10-CM | POA: Diagnosis not present

## 2024-01-04 DIAGNOSIS — G4733 Obstructive sleep apnea (adult) (pediatric): Secondary | ICD-10-CM | POA: Diagnosis not present

## 2024-01-04 DIAGNOSIS — I480 Paroxysmal atrial fibrillation: Secondary | ICD-10-CM | POA: Diagnosis not present

## 2024-01-04 NOTE — Progress Notes (Signed)
____________________________________________________________  Attending physician addendum:  Thank you for sending this case to me. I have reviewed the entire note and agree with the plan.  Dr Cliffton Asters replied that he would like this patient to have a colonoscopy prior to any surgery for her [place] uterine fistula. It can be booked with me. Incidentally, please review the medication list in this note because it lists supplemental oxygen at nighttime.  I do not know if that is an active order and that the patient is using it, or if it may be left on her list from some previous illness. We need to know this to determine whether or not the procedure can be done in the [facility] versus the hospital endoscopy lab.  I am booking into April for my hospital outpatient procedures.  Amada Jupiter, MD  ____________________________________________________________

## 2024-01-06 ENCOUNTER — Other Ambulatory Visit: Payer: Self-pay

## 2024-01-06 DIAGNOSIS — N824 Other female intestinal-genital tract fistulae: Secondary | ICD-10-CM

## 2024-01-06 MED ORDER — SUFLAVE 178.7 G PO SOLR: 1.0000 | Freq: Once | ORAL | 0 refills | Status: AC

## 2024-01-06 NOTE — Telephone Encounter (Signed)
 This encounter was created in error - please disregard.  This encounter was created in error - please disregard.

## 2024-01-10 DIAGNOSIS — E860 Dehydration: Secondary | ICD-10-CM | POA: Diagnosis not present

## 2024-01-10 DIAGNOSIS — J4540 Moderate persistent asthma, uncomplicated: Secondary | ICD-10-CM | POA: Diagnosis not present

## 2024-01-10 DIAGNOSIS — N3000 Acute cystitis without hematuria: Secondary | ICD-10-CM | POA: Diagnosis not present

## 2024-01-10 DIAGNOSIS — D72829 Elevated white blood cell count, unspecified: Secondary | ICD-10-CM | POA: Diagnosis not present

## 2024-01-10 DIAGNOSIS — M109 Gout, unspecified: Secondary | ICD-10-CM | POA: Diagnosis not present

## 2024-01-10 DIAGNOSIS — E785 Hyperlipidemia, unspecified: Secondary | ICD-10-CM | POA: Diagnosis not present

## 2024-01-10 DIAGNOSIS — I10 Essential (primary) hypertension: Secondary | ICD-10-CM | POA: Diagnosis not present

## 2024-01-10 DIAGNOSIS — D649 Anemia, unspecified: Secondary | ICD-10-CM | POA: Diagnosis not present

## 2024-01-10 DIAGNOSIS — M19041 Primary osteoarthritis, right hand: Secondary | ICD-10-CM | POA: Diagnosis not present

## 2024-01-10 DIAGNOSIS — Z9981 Dependence on supplemental oxygen: Secondary | ICD-10-CM | POA: Diagnosis not present

## 2024-01-10 DIAGNOSIS — M19042 Primary osteoarthritis, left hand: Secondary | ICD-10-CM | POA: Diagnosis not present

## 2024-01-10 DIAGNOSIS — M1990 Unspecified osteoarthritis, unspecified site: Secondary | ICD-10-CM | POA: Diagnosis not present

## 2024-01-10 DIAGNOSIS — Z792 Long term (current) use of antibiotics: Secondary | ICD-10-CM | POA: Diagnosis not present

## 2024-01-10 DIAGNOSIS — G4733 Obstructive sleep apnea (adult) (pediatric): Secondary | ICD-10-CM | POA: Diagnosis not present

## 2024-01-10 DIAGNOSIS — Z7951 Long term (current) use of inhaled steroids: Secondary | ICD-10-CM | POA: Diagnosis not present

## 2024-01-10 DIAGNOSIS — G43909 Migraine, unspecified, not intractable, without status migrainosus: Secondary | ICD-10-CM | POA: Diagnosis not present

## 2024-01-10 DIAGNOSIS — M170 Bilateral primary osteoarthritis of knee: Secondary | ICD-10-CM | POA: Diagnosis not present

## 2024-01-10 DIAGNOSIS — I480 Paroxysmal atrial fibrillation: Secondary | ICD-10-CM | POA: Diagnosis not present

## 2024-01-12 ENCOUNTER — Other Ambulatory Visit: Payer: Self-pay | Admitting: Internal Medicine

## 2024-01-12 DIAGNOSIS — M170 Bilateral primary osteoarthritis of knee: Secondary | ICD-10-CM | POA: Diagnosis not present

## 2024-01-12 DIAGNOSIS — N3000 Acute cystitis without hematuria: Secondary | ICD-10-CM | POA: Diagnosis not present

## 2024-01-12 DIAGNOSIS — E785 Hyperlipidemia, unspecified: Secondary | ICD-10-CM | POA: Diagnosis not present

## 2024-01-12 DIAGNOSIS — M109 Gout, unspecified: Secondary | ICD-10-CM | POA: Diagnosis not present

## 2024-01-12 DIAGNOSIS — M19041 Primary osteoarthritis, right hand: Secondary | ICD-10-CM | POA: Diagnosis not present

## 2024-01-12 DIAGNOSIS — I10 Essential (primary) hypertension: Secondary | ICD-10-CM | POA: Diagnosis not present

## 2024-01-12 DIAGNOSIS — J4540 Moderate persistent asthma, uncomplicated: Secondary | ICD-10-CM | POA: Diagnosis not present

## 2024-01-12 DIAGNOSIS — D72829 Elevated white blood cell count, unspecified: Secondary | ICD-10-CM | POA: Diagnosis not present

## 2024-01-12 DIAGNOSIS — Z792 Long term (current) use of antibiotics: Secondary | ICD-10-CM | POA: Diagnosis not present

## 2024-01-12 DIAGNOSIS — Z9981 Dependence on supplemental oxygen: Secondary | ICD-10-CM | POA: Diagnosis not present

## 2024-01-12 DIAGNOSIS — M19042 Primary osteoarthritis, left hand: Secondary | ICD-10-CM | POA: Diagnosis not present

## 2024-01-12 DIAGNOSIS — M1990 Unspecified osteoarthritis, unspecified site: Secondary | ICD-10-CM | POA: Diagnosis not present

## 2024-01-12 DIAGNOSIS — E860 Dehydration: Secondary | ICD-10-CM | POA: Diagnosis not present

## 2024-01-12 DIAGNOSIS — D649 Anemia, unspecified: Secondary | ICD-10-CM | POA: Diagnosis not present

## 2024-01-12 DIAGNOSIS — G4733 Obstructive sleep apnea (adult) (pediatric): Secondary | ICD-10-CM | POA: Diagnosis not present

## 2024-01-12 DIAGNOSIS — G43909 Migraine, unspecified, not intractable, without status migrainosus: Secondary | ICD-10-CM | POA: Diagnosis not present

## 2024-01-12 DIAGNOSIS — I480 Paroxysmal atrial fibrillation: Secondary | ICD-10-CM | POA: Diagnosis not present

## 2024-01-12 DIAGNOSIS — Z7951 Long term (current) use of inhaled steroids: Secondary | ICD-10-CM | POA: Diagnosis not present

## 2024-01-13 DIAGNOSIS — G43909 Migraine, unspecified, not intractable, without status migrainosus: Secondary | ICD-10-CM | POA: Diagnosis not present

## 2024-01-13 DIAGNOSIS — M19041 Primary osteoarthritis, right hand: Secondary | ICD-10-CM | POA: Diagnosis not present

## 2024-01-13 DIAGNOSIS — I10 Essential (primary) hypertension: Secondary | ICD-10-CM | POA: Diagnosis not present

## 2024-01-13 DIAGNOSIS — E860 Dehydration: Secondary | ICD-10-CM | POA: Diagnosis not present

## 2024-01-13 DIAGNOSIS — J4540 Moderate persistent asthma, uncomplicated: Secondary | ICD-10-CM | POA: Diagnosis not present

## 2024-01-13 DIAGNOSIS — Z792 Long term (current) use of antibiotics: Secondary | ICD-10-CM | POA: Diagnosis not present

## 2024-01-13 DIAGNOSIS — N3000 Acute cystitis without hematuria: Secondary | ICD-10-CM | POA: Diagnosis not present

## 2024-01-13 DIAGNOSIS — D649 Anemia, unspecified: Secondary | ICD-10-CM | POA: Diagnosis not present

## 2024-01-13 DIAGNOSIS — M1990 Unspecified osteoarthritis, unspecified site: Secondary | ICD-10-CM | POA: Diagnosis not present

## 2024-01-13 DIAGNOSIS — I480 Paroxysmal atrial fibrillation: Secondary | ICD-10-CM | POA: Diagnosis not present

## 2024-01-13 DIAGNOSIS — D72829 Elevated white blood cell count, unspecified: Secondary | ICD-10-CM | POA: Diagnosis not present

## 2024-01-13 DIAGNOSIS — M109 Gout, unspecified: Secondary | ICD-10-CM | POA: Diagnosis not present

## 2024-01-13 DIAGNOSIS — M170 Bilateral primary osteoarthritis of knee: Secondary | ICD-10-CM | POA: Diagnosis not present

## 2024-01-13 DIAGNOSIS — Z7951 Long term (current) use of inhaled steroids: Secondary | ICD-10-CM | POA: Diagnosis not present

## 2024-01-13 DIAGNOSIS — M19042 Primary osteoarthritis, left hand: Secondary | ICD-10-CM | POA: Diagnosis not present

## 2024-01-13 DIAGNOSIS — E785 Hyperlipidemia, unspecified: Secondary | ICD-10-CM | POA: Diagnosis not present

## 2024-01-13 DIAGNOSIS — G4733 Obstructive sleep apnea (adult) (pediatric): Secondary | ICD-10-CM | POA: Diagnosis not present

## 2024-01-13 DIAGNOSIS — Z9981 Dependence on supplemental oxygen: Secondary | ICD-10-CM | POA: Diagnosis not present

## 2024-01-16 ENCOUNTER — Other Ambulatory Visit: Payer: Self-pay | Admitting: Internal Medicine

## 2024-01-17 ENCOUNTER — Ambulatory Visit (HOSPITAL_COMMUNITY)
Admission: RE | Admit: 2024-01-17 | Discharge: 2024-01-17 | Disposition: A | Discharge status: Home / Self Care | Payer: 59 | Source: Ambulatory Visit | Attending: Gynecologic Oncology | Admitting: Gynecologic Oncology

## 2024-01-17 DIAGNOSIS — R9389 Abnormal findings on diagnostic imaging of other specified body structures: Secondary | ICD-10-CM | POA: Diagnosis not present

## 2024-01-17 DIAGNOSIS — N824 Other female intestinal-genital tract fistulae: Secondary | ICD-10-CM | POA: Diagnosis present

## 2024-01-18 ENCOUNTER — Ambulatory Visit: Payer: Self-pay | Admitting: Licensed Clinical Social Worker

## 2024-01-18 ENCOUNTER — Ambulatory Visit: Payer: Self-pay

## 2024-01-18 DIAGNOSIS — M19041 Primary osteoarthritis, right hand: Secondary | ICD-10-CM | POA: Diagnosis not present

## 2024-01-18 DIAGNOSIS — Z7951 Long term (current) use of inhaled steroids: Secondary | ICD-10-CM | POA: Diagnosis not present

## 2024-01-18 DIAGNOSIS — N3000 Acute cystitis without hematuria: Secondary | ICD-10-CM | POA: Diagnosis not present

## 2024-01-18 DIAGNOSIS — Z9981 Dependence on supplemental oxygen: Secondary | ICD-10-CM | POA: Diagnosis not present

## 2024-01-18 DIAGNOSIS — J4540 Moderate persistent asthma, uncomplicated: Secondary | ICD-10-CM | POA: Diagnosis not present

## 2024-01-18 DIAGNOSIS — M109 Gout, unspecified: Secondary | ICD-10-CM | POA: Diagnosis not present

## 2024-01-18 DIAGNOSIS — G43909 Migraine, unspecified, not intractable, without status migrainosus: Secondary | ICD-10-CM | POA: Diagnosis not present

## 2024-01-18 DIAGNOSIS — M19042 Primary osteoarthritis, left hand: Secondary | ICD-10-CM | POA: Diagnosis not present

## 2024-01-18 DIAGNOSIS — I10 Essential (primary) hypertension: Secondary | ICD-10-CM | POA: Diagnosis not present

## 2024-01-18 DIAGNOSIS — D649 Anemia, unspecified: Secondary | ICD-10-CM | POA: Diagnosis not present

## 2024-01-18 DIAGNOSIS — M1990 Unspecified osteoarthritis, unspecified site: Secondary | ICD-10-CM | POA: Diagnosis not present

## 2024-01-18 DIAGNOSIS — M170 Bilateral primary osteoarthritis of knee: Secondary | ICD-10-CM | POA: Diagnosis not present

## 2024-01-18 DIAGNOSIS — I480 Paroxysmal atrial fibrillation: Secondary | ICD-10-CM | POA: Diagnosis not present

## 2024-01-18 DIAGNOSIS — G4733 Obstructive sleep apnea (adult) (pediatric): Secondary | ICD-10-CM | POA: Diagnosis not present

## 2024-01-18 DIAGNOSIS — E785 Hyperlipidemia, unspecified: Secondary | ICD-10-CM | POA: Diagnosis not present

## 2024-01-18 DIAGNOSIS — Z792 Long term (current) use of antibiotics: Secondary | ICD-10-CM | POA: Diagnosis not present

## 2024-01-18 DIAGNOSIS — D72829 Elevated white blood cell count, unspecified: Secondary | ICD-10-CM | POA: Diagnosis not present

## 2024-01-18 DIAGNOSIS — E860 Dehydration: Secondary | ICD-10-CM | POA: Diagnosis not present

## 2024-01-18 NOTE — Patient Outreach (Addendum)
 Care Coordination   Initial Visit Note   02/02/2024 Name: Heather Grant MRN: [id number] DOB: 10-14-1941  Heather Grant is a 83 y.o. year old female who sees Myrlene Broker, MD for primary care. I spoke with daughter Heather Grant(daughter/caregiver) and Heather Grant by phone today.  What matters to the patients health and wellness today?  Daughter reports patient is doing good. She states patient is not having any abdominal pain or drainage. She has attended follow up appointments as scheduled. PT/OT/Nursing from [facility] is actively involved. Heather Grant reports she is feeling better since hospitalization. Patient has follow up with GYN on Friday.   Goals Addressed             This Visit's Progress    COMPLETED: Education re: feet swelling/HTN       Interventions Today    Flowsheet Row Most Recent Value  Chronic Disease   Chronic disease during today's visit Hypertension (HTN), Other  [osteoarthritis]  General Interventions   General Interventions Discussed/Reviewed General Interventions Reviewed, Doctor Visits  [Evaluation of current treatment plan for health condition and patient's adherence to plan.]  Doctor Visits Discussed/Reviewed Specialist, PCP  PCP/Specialist Visits Compliance with follow-up visit  [reviewed upcoming follow up appointments]  Education Interventions   Education Provided Provided Education  Provided Verbal Education On When to see the doctor, Medication  Mental Health Interventions   Mental Health Discussed/Reviewed Mental Health Discussed  [encouraged to work with LCSW]  Nutrition Interventions   Nutrition Discussed/Reviewed Nutrition Reviewed  Safety Interventions   Safety Discussed/Reviewed Safety Reviewed  [walks with walker and used cane sometimes.]           Maintain and/or improve health       Interventions Today    Flowsheet Row Most Recent Value  Chronic Disease   Chronic disease during today's visit  Chronic Obstructive Pulmonary Disease (COPD), Other, Hypertension (HTN)  [chronic knee pain]  General Interventions   General Interventions Discussed/Reviewed General Interventions Discussed, Doctor Visits  [Evaluation of current treatment plan for health condition and patient's adherence to plan.]  Doctor Visits Discussed/Reviewed PCP, Specialist, Annual Wellness Visits  PCP/Specialist Visits Compliance with follow-up visit  [review upcoming/scheduled appointments]  Education Interventions   Education Provided Provided Education  Provided Verbal Education On When to see the doctor, Mental Health/Coping with Illness, Nutrition, Medication  [advised to take medications as prescribed, attend provider visit as scheduled, work with therapist as recommended]  Mental Health Interventions   Mental Health Discussed/Reviewed Mental Health Discussed, Anxiety  [patient expresses some situational anxiety about results of [place] taken yesterday. she expresses "she is feeling good". RNCM advised to containue to do those things that she enjoys. continue to work with LCSW]  Nutrition Interventions   Nutrition Discussed/Reviewed Nutrition Discussed  Pharmacy Interventions   Pharmacy Dicussed/Reviewed Pharmacy Topics Discussed  [medications reviewed with daughter, [name]]  Safety Interventions   Safety Discussed/Reviewed Safety Discussed, Home Safety, Fall Risk  [active with [facility] home health]  Home Safety Assistive Devices  [confirmed patient is active with home health, discussed fall prevention strategies and encouraged patient to use walker at all times with ambulation or as recommended by therapist.]            SDOH assessments and interventions completed:  No recently completed.   Care Coordination Interventions:  Yes, provided   Follow up plan: Follow up call scheduled for 02/02/24    Encounter Outcome:  Patient Visit Completed   Kathyrn Sheriff, RN, MSN, BSN, CCM  [facility]  [facility], Population Health Case Manager Phone: [phone]

## 2024-01-18 NOTE — Patient Instructions (Signed)
 Visit Information  Thank you for taking time to visit with me today. Please don't hesitate to contact me if I can be of assistance to you.   Following are the goals we discussed today:  Continue to take medications as prescribed. Continue to attend provider visits as scheduled Continue to eat healthy, lean meats, vegetables, fruits, avoid saturated and transfats Contact provider with health questions or concerns as needed Continue to work with therapist as recommended   Our next appointment is by telephone on 02/02/24 at 10:30 am  Please call the care guide team at [phone] if you need to cancel or reschedule your appointment.   If you are experiencing a Mental Health or Behavioral Health Crisis or need someone to talk to, please call the Suicide and Crisis Lifeline: 988 call the [place] National Suicide Prevention Lifeline: 587 582 0781 or TTY: 2535911947 TTY 2601159415) to talk to a trained counselor   Kathyrn Sheriff, RN, MSN, BSN, CCM [place]  [facility], Population Health Case Manager Phone: [phone]

## 2024-01-19 NOTE — Patient Instructions (Signed)
 Visit Information  Thank you for taking time to visit with me today. Please don't hesitate to contact me if I can be of assistance to you.   Following are the goals we discussed today:   Goals Addressed             This Visit's Progress    Obtain Supportive Resources   On track    Activities and task to complete in order to accomplish goals.   Keep all upcoming appointments discussed today Continue with compliance of taking medication prescribed by Doctor Implement healthy coping skills discussed to assist with management of symptoms           Our next appointment is by telephone on 3/25 at 11 AM  Please call the care guide team at [phone] if you need to cancel or reschedule your appointment.   If you are experiencing a Mental Health or Behavioral Health Crisis or need someone to talk to, please call the Suicide and Crisis Lifeline: 988 call 911   Patient verbalizes understanding of instructions and care plan provided today and agrees to view in MyChart. Active MyChart status and patient understanding of how to access instructions and care plan via MyChart confirmed with patient.     Windy Fast [facility]  [facility], [facility] Clinical Social Worker Direct Dial: [phone]  Fax: [phone] Website: [email or web address] 6:53 AM

## 2024-01-19 NOTE — Patient Outreach (Signed)
  Care Coordination   Follow Up Visit Note   01/18/2024 Name: Heather Grant MRN: [id number] DOB: 1941/09/16  [name] is a 83 y.o. year old female who sees Myrlene Broker, MD for primary care. I spoke with  [name] and daughter, [name], by phone today.  What matters to the patients health and wellness today?  Symptom Management    Goals Addressed             This Visit's Progress    Obtain Supportive Resources   On track    Activities and task to complete in order to accomplish goals.   Keep all upcoming appointments discussed today Continue with compliance of taking medication prescribed by Doctor Implement healthy coping skills discussed to assist with management of symptoms           SDOH assessments and interventions completed:  No     Care Coordination Interventions:  Yes, provided  Interventions Today    Flowsheet Row Most Recent Value  Chronic Disease   Chronic disease during today's visit Chronic Obstructive Pulmonary Disease (COPD), Hypertension (HTN), Other  [Chronic Knee Pain, Anxiety]  General Interventions   General Interventions Discussed/Reviewed General Interventions Reviewed, Doctor Visits, Level of Care  Doctor Visits Discussed/Reviewed Doctor Visits Reviewed  Level of Care --  [Patient continues to participate in PT and OT]  Exercise Interventions   Exercise Discussed/Reviewed Exercise Reviewed  Mental Health Interventions   Mental Health Discussed/Reviewed Mental Health Reviewed, Coping Strategies, Anxiety  [Validation and encouragement provided. Discussed strategies to assist with symptom management]  Nutrition Interventions   Nutrition Discussed/Reviewed Nutrition Reviewed  Pharmacy Interventions   Pharmacy Dicussed/Reviewed Pharmacy Topics Reviewed, Medication Adherence  Safety Interventions   Safety Discussed/Reviewed Safety Reviewed       Follow up plan: Follow up call scheduled for 4-6 weeks     Encounter Outcome:  Patient Visit Completed   Jenel Lucks, LCSW [place]  [facility], [facility] Clinical Social Worker Direct Dial: [phone]  Fax: [phone] Website: [email or web address] 6:53 AM

## 2024-01-21 ENCOUNTER — Inpatient Hospital Stay: Payer: 59 | Admitting: Gynecologic Oncology

## 2024-01-21 ENCOUNTER — Encounter: Payer: Self-pay | Admitting: Gynecologic Oncology

## 2024-01-21 ENCOUNTER — Inpatient Hospital Stay: Payer: 59 | Attending: Gynecologic Oncology | Admitting: Gynecologic Oncology

## 2024-01-21 VITALS — BP 132/62 | HR 72 | Temp 98.1°F | Resp 19 | Wt 159.4 lb

## 2024-01-21 DIAGNOSIS — N824 Other female intestinal-genital tract fistulae: Secondary | ICD-10-CM | POA: Diagnosis not present

## 2024-01-21 DIAGNOSIS — N719 Inflammatory disease of uterus, unspecified: Secondary | ICD-10-CM

## 2024-01-21 DIAGNOSIS — R9389 Abnormal findings on diagnostic imaging of other specified body structures: Secondary | ICD-10-CM | POA: Diagnosis not present
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
## 2024-01-21 NOTE — Progress Notes (Signed)
 Patient here for a pre-operative appointment. She will be scheduled for a  hysteroscopy with dilation and curettage of the uterus, possible ultrasound guidance.  The surgery was discussed in detail.  See after visit summary for additional details.      Discussed post-op pain management in detail including the aspects of the enhanced recovery pathway.  We discussed the use of tylenol post-op and to monitor for a maximum of 4,000 mg in a 24 hour period.  Discussed bowel regimen in detail.     Discussed the use of SCDs and measures to take at home to prevent DVT including frequent mobility.  Reportable signs and symptoms of DVT discussed. Post-operative instructions discussed and expectations for after surgery. Incisional care discussed as well including reportable signs and symptoms including erythema, drainage, wound separation.     30 minutes spent with the patient.  Verbalizing understanding of material discussed. No needs or concerns voiced at the end of the visit.   Advised patient to call for any needs.  Advised that her post-operative medications had been prescribed and could be picked up at any time.    This appointment is included in the global surgical bundle as pre-operative teaching and has no charge.
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
## 2024-01-21 NOTE — Patient Instructions (Addendum)
 Preparing for your Surgery  Plan for surgery with Dr. Eugene Garnet at [facility]. You will be scheduled for hysteroscopy with dilation and curettage of the uterus, possible ultrasound guidance.  We will contact you with a surgery date at the beginning of next week. Once a surgery date has been selected, we will set you up for a one week phone call visit after the procedure to discuss the results.   Pre-operative Testing -You will receive a phone call from presurgical testing at [facility] to discuss surgery instructions and arrange for lab work if needed.  -Bring your insurance card, copy of an advanced directive if applicable, medication list.  -You should not be taking blood thinners or aspirin at least ten days prior to surgery unless instructed by your surgeon.  -Do not take supplements such as fish oil (omega 3), red yeast rice, turmeric before your surgery. You want to avoid medications with aspirin in them including headache powders such as BC or Goody's), Excedrin migraine.  Day Before Surgery at Home -You will be advised you can have clear liquids up until 3 hours before your surgery.    Your role in recovery Your role is to become active as soon as directed by your doctor, while still giving yourself time to heal.  Rest when you feel tired. You will be asked to do the following in order to speed your recovery:  - Cough and breathe deeply. This helps to clear and expand your lungs and can prevent pneumonia after surgery.  - STAY ACTIVE WHEN YOU GET HOME. Do mild physical activity. Walking or moving your legs help your circulation and body functions return to normal. Do not try to get up or walk alone the first time after surgery.   -If you develop swelling on one leg or the other, pain in the back of your leg, redness/warmth in one of your legs, please call the office or go to the Emergency Room to have a doppler to rule out a blood clot. For shortness of  breath, chest pain-seek care in the Emergency Room as soon as possible. - Actively manage your pain. Managing your pain lets you move in comfort. We will ask you to rate your pain on a scale of zero to 10. It is your responsibility to tell your doctor or nurse where and how much you hurt so your pain can be treated.  Special Considerations -Your final pathology results from surgery should be available around one week after surgery and the results will be relayed to you when available.  -FMLA forms can be faxed to [phone] and please allow 5-7 business days for completion.  Pain Management After Surgery -Make sure that you have Tylenol at home IF YOU ARE ABLE TO TAKE THESE MEDICATION to use on a regular basis after surgery for pain control.   -Review the attached handout on narcotic use and their risks and side effects.   Bowel Regimen -It is important to prevent constipation and drink adequate amounts of liquids. You may need to take a laxative after surgery for the first several days to prevent constipation and straining.  Risks of Surgery Risks of surgery are low but include bleeding, infection, damage to surrounding structures, re-operation, blood clots, and very rarely death.  AFTER SURGERY INSTRUCTIONS  Return to work: 1-2 days if applicable  Activity: 1. Be up and out of the bed during the day.  Take a nap if needed.  You may walk up steps  but be careful and use the hand rail.  Stair climbing will tire you more than you think, you may need to stop part way and rest.   2. No lifting or straining for 2 weeks over 10 pounds. No pushing, pulling, straining for 2 weeks.  3. No driving for minimum 24 hours after surgery.  Do not drive if you are taking narcotic pain medicine and make sure that your reaction time has returned.   4. You can shower as soon as the next day after surgery. Shower daily. No tub baths or submerging your body in water until cleared by your surgeon for at  least 2 weeks. If you have the soap that was given to you by pre-surgical testing that was used before surgery, you do not need to use it afterwards because this can irritate your incisions.   5. No sexual activity and nothing in the vagina for 2 weeks minimum.  6. You may experience vaginal spotting and discharge after surgery.  The spotting is normal but if you experience heavy bleeding, call our office.  7. Take Tylenol for pain if you are able to take these medication and only use narcotic pain medication for severe pain not relieved by the Tylenol.  Monitor your Tylenol intake to a max of 4,000 mg in a 24 hour period.   Diet: 1. Low sodium Heart Healthy Diet is recommended but you are cleared to resume your normal (before surgery) diet after your procedure.  2. It is safe to use a laxative, such as Miralax or Colace, if you have difficulty moving your bowels.   Wound Care: 1. Keep clean and dry.  Shower daily.  Reasons to call the Doctor: Fever - Oral temperature greater than 100.4 degrees Fahrenheit Foul-smelling vaginal discharge Difficulty urinating Nausea and vomiting Increased pain at the site of the incision that is unrelieved with pain medicine. Difficulty breathing with or without chest pain New calf pain especially if only on one side Sudden, continuing increased vaginal bleeding with or without clots.   Contacts: For questions or concerns you should contact:  Dr. Eugene Garnet at [phone]  Warner Mccreedy, NP at [phone]  After Hours: call [phone] and have the GYN Oncologist paged/contacted (after 5 pm or on the weekends).  Messages sent via mychart are for non-urgent matters and are not responded to after hours so for urgent needs, please call the after hours number.

## 2024-01-24 ENCOUNTER — Encounter: Payer: Self-pay | Admitting: Gynecologic Oncology

## 2024-01-25 DIAGNOSIS — G43909 Migraine, unspecified, not intractable, without status migrainosus: Secondary | ICD-10-CM | POA: Diagnosis not present

## 2024-01-25 DIAGNOSIS — I10 Essential (primary) hypertension: Secondary | ICD-10-CM | POA: Diagnosis not present

## 2024-01-25 DIAGNOSIS — M19042 Primary osteoarthritis, left hand: Secondary | ICD-10-CM | POA: Diagnosis not present

## 2024-01-25 DIAGNOSIS — Z7951 Long term (current) use of inhaled steroids: Secondary | ICD-10-CM | POA: Diagnosis not present

## 2024-01-25 DIAGNOSIS — M109 Gout, unspecified: Secondary | ICD-10-CM | POA: Diagnosis not present

## 2024-01-25 DIAGNOSIS — Z792 Long term (current) use of antibiotics: Secondary | ICD-10-CM | POA: Diagnosis not present

## 2024-01-25 DIAGNOSIS — J4540 Moderate persistent asthma, uncomplicated: Secondary | ICD-10-CM | POA: Diagnosis not present

## 2024-01-25 DIAGNOSIS — E860 Dehydration: Secondary | ICD-10-CM | POA: Diagnosis not present

## 2024-01-25 DIAGNOSIS — I480 Paroxysmal atrial fibrillation: Secondary | ICD-10-CM | POA: Diagnosis not present

## 2024-01-25 DIAGNOSIS — D649 Anemia, unspecified: Secondary | ICD-10-CM | POA: Diagnosis not present

## 2024-01-25 DIAGNOSIS — N3000 Acute cystitis without hematuria: Secondary | ICD-10-CM | POA: Diagnosis not present

## 2024-01-25 DIAGNOSIS — M19041 Primary osteoarthritis, right hand: Secondary | ICD-10-CM | POA: Diagnosis not present

## 2024-01-25 DIAGNOSIS — D72829 Elevated white blood cell count, unspecified: Secondary | ICD-10-CM | POA: Diagnosis not present

## 2024-01-25 DIAGNOSIS — M1990 Unspecified osteoarthritis, unspecified site: Secondary | ICD-10-CM | POA: Diagnosis not present

## 2024-01-25 DIAGNOSIS — M170 Bilateral primary osteoarthritis of knee: Secondary | ICD-10-CM | POA: Diagnosis not present

## 2024-01-25 DIAGNOSIS — Z9981 Dependence on supplemental oxygen: Secondary | ICD-10-CM | POA: Diagnosis not present

## 2024-01-25 DIAGNOSIS — G4733 Obstructive sleep apnea (adult) (pediatric): Secondary | ICD-10-CM | POA: Diagnosis not present

## 2024-01-25 DIAGNOSIS — E785 Hyperlipidemia, unspecified: Secondary | ICD-10-CM | POA: Diagnosis not present

## 2024-01-26 ENCOUNTER — Telehealth: Payer: Self-pay

## 2024-01-26 NOTE — Telephone Encounter (Signed)
-----   Message from Doylene Bode sent at 01/26/2024  3:59 PM EST ----- Can you please call her daughter and let her know her are looking at March 19 or 20th. I have her booked on March 20 right now but it is at the end of the day and trying to adjust this. We will contact her closer to the date when sooner time becomes available during the day and let her know official date and time but one of those days.

## 2024-01-26 NOTE — Telephone Encounter (Signed)
 Pt's daughter,[name], aware of potential surgery dates as reported by Warner Mccreedy NP. She was thankful for the call and will wait to hear from our office.

## 2024-01-27 ENCOUNTER — Other Ambulatory Visit (HOSPITAL_COMMUNITY): Payer: Self-pay | Admitting: Gynecologic Oncology

## 2024-01-27 DIAGNOSIS — N824 Other female intestinal-genital tract fistulae: Secondary | ICD-10-CM

## 2024-01-28 ENCOUNTER — Other Ambulatory Visit (HOSPITAL_COMMUNITY): Payer: 59

## 2024-01-28 ENCOUNTER — Ambulatory Visit: Payer: 59

## 2024-01-28 VITALS — BP 138/72 | HR 72 | Ht 64.0 in | Wt 157.0 lb

## 2024-01-28 DIAGNOSIS — I480 Paroxysmal atrial fibrillation: Secondary | ICD-10-CM | POA: Diagnosis not present

## 2024-01-28 DIAGNOSIS — M109 Gout, unspecified: Secondary | ICD-10-CM | POA: Diagnosis not present

## 2024-01-28 DIAGNOSIS — D649 Anemia, unspecified: Secondary | ICD-10-CM | POA: Diagnosis not present

## 2024-01-28 DIAGNOSIS — Z0000 Encounter for general adult medical examination without abnormal findings: Secondary | ICD-10-CM

## 2024-01-28 DIAGNOSIS — M1990 Unspecified osteoarthritis, unspecified site: Secondary | ICD-10-CM | POA: Diagnosis not present

## 2024-01-28 DIAGNOSIS — E785 Hyperlipidemia, unspecified: Secondary | ICD-10-CM | POA: Diagnosis not present

## 2024-01-28 DIAGNOSIS — Z9981 Dependence on supplemental oxygen: Secondary | ICD-10-CM | POA: Diagnosis not present

## 2024-01-28 DIAGNOSIS — E860 Dehydration: Secondary | ICD-10-CM | POA: Diagnosis not present

## 2024-01-28 DIAGNOSIS — M170 Bilateral primary osteoarthritis of knee: Secondary | ICD-10-CM | POA: Diagnosis not present

## 2024-01-28 DIAGNOSIS — J4540 Moderate persistent asthma, uncomplicated: Secondary | ICD-10-CM | POA: Diagnosis not present

## 2024-01-28 DIAGNOSIS — N3000 Acute cystitis without hematuria: Secondary | ICD-10-CM | POA: Diagnosis not present

## 2024-01-28 DIAGNOSIS — M19041 Primary osteoarthritis, right hand: Secondary | ICD-10-CM | POA: Diagnosis not present

## 2024-01-28 DIAGNOSIS — G43909 Migraine, unspecified, not intractable, without status migrainosus: Secondary | ICD-10-CM | POA: Diagnosis not present

## 2024-01-28 DIAGNOSIS — D72829 Elevated white blood cell count, unspecified: Secondary | ICD-10-CM | POA: Diagnosis not present

## 2024-01-28 DIAGNOSIS — I10 Essential (primary) hypertension: Secondary | ICD-10-CM | POA: Diagnosis not present

## 2024-01-28 DIAGNOSIS — Z792 Long term (current) use of antibiotics: Secondary | ICD-10-CM | POA: Diagnosis not present

## 2024-01-28 DIAGNOSIS — Z0100 Encounter for examination of eyes and vision without abnormal findings: Secondary | ICD-10-CM

## 2024-01-28 DIAGNOSIS — Z7951 Long term (current) use of inhaled steroids: Secondary | ICD-10-CM | POA: Diagnosis not present

## 2024-01-28 DIAGNOSIS — G4733 Obstructive sleep apnea (adult) (pediatric): Secondary | ICD-10-CM | POA: Diagnosis not present

## 2024-01-28 DIAGNOSIS — M19042 Primary osteoarthritis, left hand: Secondary | ICD-10-CM | POA: Diagnosis not present

## 2024-01-28 NOTE — Patient Instructions (Addendum)
 Heather Grant , Thank you for taking time to come for your Medicare Wellness Visit. I appreciate your ongoing commitment to your health goals. Please review the following plan we discussed and let me know if I can assist you in the future.   Referrals/Orders/Follow-Ups/Clinician Recommendations: Aim for 30 minutes of exercise or brisk walking, 6-8 glasses of water, and 5 servings of fruits and vegetables each day.   This is a list of the screening recommended for you and due dates:  Health Maintenance  Topic Date Due   Zoster (Shingles) Vaccine (1 of 2) 02/12/1962   DTaP/Tdap/Td vaccine (1 - Tdap) 08/24/2012   Mammogram  01/29/2023   COVID-19 Vaccine (5 - 2024-25 season) 08/01/2023   Flu Shot  02/28/2024*   Medicare Annual Wellness Visit  01/27/2025   Pneumonia Vaccine  Completed   DEXA scan (bone density measurement)  Completed   HPV Vaccine  Aged Out  *Topic was postponed. The date shown is not the original due date.    Advanced directives: (Provided) Advance directive discussed with you today. I have provided a copy for you to complete at home and have notarized. Once this is complete, please bring a copy in to our office so we can scan it into your chart.   Next Medicare Annual Wellness Visit scheduled for next year: Yes - 01/2025
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
## 2024-01-31 DIAGNOSIS — G4733 Obstructive sleep apnea (adult) (pediatric): Secondary | ICD-10-CM | POA: Diagnosis not present

## 2024-01-31 DIAGNOSIS — M19041 Primary osteoarthritis, right hand: Secondary | ICD-10-CM | POA: Diagnosis not present

## 2024-01-31 DIAGNOSIS — M109 Gout, unspecified: Secondary | ICD-10-CM | POA: Diagnosis not present

## 2024-01-31 DIAGNOSIS — Z9981 Dependence on supplemental oxygen: Secondary | ICD-10-CM | POA: Diagnosis not present

## 2024-01-31 DIAGNOSIS — D649 Anemia, unspecified: Secondary | ICD-10-CM | POA: Diagnosis not present

## 2024-01-31 DIAGNOSIS — E860 Dehydration: Secondary | ICD-10-CM | POA: Diagnosis not present

## 2024-01-31 DIAGNOSIS — G43909 Migraine, unspecified, not intractable, without status migrainosus: Secondary | ICD-10-CM | POA: Diagnosis not present

## 2024-01-31 DIAGNOSIS — E785 Hyperlipidemia, unspecified: Secondary | ICD-10-CM | POA: Diagnosis not present

## 2024-01-31 DIAGNOSIS — R0602 Shortness of breath: Secondary | ICD-10-CM | POA: Diagnosis not present

## 2024-01-31 DIAGNOSIS — N3000 Acute cystitis without hematuria: Secondary | ICD-10-CM | POA: Diagnosis not present

## 2024-01-31 DIAGNOSIS — M1990 Unspecified osteoarthritis, unspecified site: Secondary | ICD-10-CM | POA: Diagnosis not present

## 2024-01-31 DIAGNOSIS — M170 Bilateral primary osteoarthritis of knee: Secondary | ICD-10-CM | POA: Diagnosis not present

## 2024-01-31 DIAGNOSIS — D72829 Elevated white blood cell count, unspecified: Secondary | ICD-10-CM | POA: Diagnosis not present

## 2024-01-31 DIAGNOSIS — J45998 Other asthma: Secondary | ICD-10-CM | POA: Diagnosis not present

## 2024-01-31 DIAGNOSIS — M19042 Primary osteoarthritis, left hand: Secondary | ICD-10-CM | POA: Diagnosis not present

## 2024-01-31 DIAGNOSIS — I10 Essential (primary) hypertension: Secondary | ICD-10-CM | POA: Diagnosis not present

## 2024-01-31 DIAGNOSIS — Z792 Long term (current) use of antibiotics: Secondary | ICD-10-CM | POA: Diagnosis not present

## 2024-01-31 DIAGNOSIS — Z7951 Long term (current) use of inhaled steroids: Secondary | ICD-10-CM | POA: Diagnosis not present

## 2024-01-31 DIAGNOSIS — J4540 Moderate persistent asthma, uncomplicated: Secondary | ICD-10-CM | POA: Diagnosis not present

## 2024-01-31 DIAGNOSIS — I480 Paroxysmal atrial fibrillation: Secondary | ICD-10-CM | POA: Diagnosis not present

## 2024-02-02 ENCOUNTER — Ambulatory Visit: Payer: Self-pay

## 2024-02-02 NOTE — Patient Outreach (Signed)
 Care Coordination   Follow Up Visit Note   02/02/2024 Name: Heather Grant MRN: [id number] DOB: [birth year]  Heather Grant is a 83 y.o. year old female who sees Heather Broker, MD for primary care. I spoke with  Heather Grant and daughter, Heather Grant by phone today.  What matters to the patients health and wellness today? Per Ms. [facility] home [facility] recently completed their therapy sessions. She acknowledges the importance of continuing to perform recommended exercises to maintain muscle strength and tone. She denies any questions or concerns at this time. RNCM also spoke with patient's daughter who continues to support patient in her health care needs. Patient completed AWV 01/28/24 and has upcoming appointment with Primary care provider on 02/14/24. Neither patient or daughter with any questions as this time.  Goals Addressed             This Visit's Progress    Maintain and/or improve health       Interventions Today    Flowsheet Row Most Recent Value  Chronic Disease   Chronic disease during today's visit Chronic Obstructive Pulmonary Disease (COPD)  General Interventions   General Interventions Discussed/Reviewed General Interventions Reviewed, Doctor Visits  [Evaluation of current treatment plan for health condition and patient's adherence to plan.]  Doctor Visits Discussed/Reviewed PCP, Specialist  [per review of chart: AWV 01/28/24]  PCP/Specialist Visits Compliance with follow-up visit  [reviewed upcoming follow up appointment/procedures with patient and daughter.]  Exercise Interventions   Exercise Discussed/Reviewed Exercise Discussed  Education Interventions   Education Provided Provided Education  Provided Verbal Education On When to see the doctor, Medication, Exercise, Other  [advised to take medications as prescribed, attend provider appointments as schedule, check BP and record routinely, eat healthy]  Nutrition  Interventions   Nutrition Discussed/Reviewed Nutrition Reviewed  Pharmacy Interventions   Pharmacy Dicussed/Reviewed Pharmacy Topics Reviewed  [medications reviewed]  Safety Interventions   Safety Discussed/Reviewed Safety Reviewed  [advised to use walker with ambulation, discussed importance of continuing to perform exercises provided by therapist. fall prevention discussed.]  Home Safety Assistive Devices            SDOH assessments and interventions completed:  No  Care Coordination Interventions:  Yes, provided   Follow up plan: Follow up call scheduled for 03/07/24    Encounter Outcome:  Patient Visit Completed   Kathyrn Sheriff, RN, MSN, BSN, CCM [place]  [facility], Population Health Case Manager Phone: [phone]

## 2024-02-02 NOTE — Patient Instructions (Signed)
 Visit Information  Thank you for taking time to visit with me today. Please don't hesitate to contact me if I can be of assistance to you.   Following are the goals we discussed today:  Continue to take medications as prescribed. Continue to attend provider visits as scheduled Continue to eat healthy, lean meats, vegetables, fruits, avoid saturated and transfats Contact provider with health questions or concerns as needed Continue to check and record blood pressure routinely and contact provider if questions or concerns  Our next appointment is by telephone on 03/07/24 at 11:00 am  Please call the care guide team at [phone] if you need to cancel or reschedule your appointment.   If you are experiencing a Mental Health or Behavioral Health Crisis or need someone to talk to, please call the Suicide and Crisis Lifeline: 988 call the [place] National Suicide Prevention Lifeline: (910)093-1841 or TTY: 254 125 6591 TTY [phone]) to talk to a trained counselor  Kathyrn Sheriff, RN, MSN, BSN, CCM [place]  [facility], Population Health Case Manager Phone: [phone]

## 2024-02-03 ENCOUNTER — Telehealth: Payer: Self-pay

## 2024-02-03 NOTE — Telephone Encounter (Signed)
 Per Warner Mccreedy NP, I reached out to, [place], pt's daughter. She is aware of procedure being moved to 3/19 with potential of being moved to 3/12.  She agrees to date/time

## 2024-02-03 NOTE — Telephone Encounter (Signed)
-----   Message from Doylene Bode sent at 02/03/2024  4:09 PM EST ----- Please let her daughter know that I have moved her procedure to March 19. There is a chance March 12 may open and we will move her up to that spot .

## 2024-02-04 ENCOUNTER — Telehealth: Payer: Self-pay | Admitting: *Deleted

## 2024-02-04 ENCOUNTER — Telehealth: Payer: Self-pay | Admitting: Internal Medicine

## 2024-02-04 ENCOUNTER — Encounter (HOSPITAL_COMMUNITY): Payer: Self-pay | Admitting: Gynecologic Oncology

## 2024-02-04 ENCOUNTER — Other Ambulatory Visit: Payer: Self-pay

## 2024-02-04 NOTE — Progress Notes (Addendum)
 COVID Vaccine Completed:  Yes  Date of COVID positive in last 90 days:  No  PCP - Hillard Danker, MD Cardiologist -  N/A  Chest x-ray - n/a EKG - 11-19-23 Epic Stress Test - n/a ECHO - 02-10-12 Epic Cardiac Cath - n/a Pacemaker/ICD device last checked: Spinal Cord Stimulator: n/a  Bowel Prep - N/A  Sleep Study - Yes, +sleep apnea CPAP - No  Fasting Blood Sugar - n/a Checks Blood Sugar _____ times a day  Last dose of GLP1 agonist-  N/A GLP1 instructions:  Hold 7 days before surgery    Last dose of SGLT-2 inhibitors-  N/A SGLT-2 instructions:  Hold 3 days before surgery   Blood Thinner Instructions:  N/A Aspirin Instructions: Last Dose:  Activity level:  Unable to climb stairs due to bilateral knee pain.  Able to perform activities of daily living without stopping and without symptoms of chest pain.  Patient has shortness of breath with exertion at times and has prn O2 if needed.  Patient lives alone  Anesthesia review:  Shortness of breath with exertion and prn home O2.  Has not needed oxygen in several weeks.    Patient denies shortness of breath, fever, cough and chest pain at PAT appointment (completed over the phone)  Patient verbalized understanding of instructions that were given to them at the PAT appointment. Patient was also instructed that they will need to review over the PAT instructions again at home before surgery.

## 2024-02-04 NOTE — Telephone Encounter (Signed)
 Per Dr Pricilla Holm faxed optimization form to PCP (Dr Okey Dupre [phone])

## 2024-02-04 NOTE — Telephone Encounter (Signed)
-----   Message from Doylene Bode sent at 02/04/2024 10:23 AM EST ----- Please let her daughter know and pt know her procedure has been moved up to Wednesday, March 12. She will receive a phone call from the hospital about arrival time etc.

## 2024-02-04 NOTE — Telephone Encounter (Signed)
 Surgical clearance received from [facility] and placed in provider box up front.   Patient is scheduled for surgery on March 12.

## 2024-02-04 NOTE — Telephone Encounter (Signed)
 Placed inside box

## 2024-02-04 NOTE — Telephone Encounter (Signed)
 Spoke with patient's daughter [name] and relayed message from Warner Mccreedy, NP that pt's procedure has been moved up to Wednesday, March 12 th. Pre-admission testing will be calling from the hospital about the time for procedure and the office will call on Tuesday for pre-op call. [name] verbalized understanding and thanked the office for calling.

## 2024-02-07 NOTE — Discharge Instructions (Signed)
 AFTER SURGERY INSTRUCTIONS   Return to work: 1-2 days if applicable   Activity: 1. Be up and out of the bed during the day.  Take a nap if needed.  You may walk up steps but be careful and use the hand rail.  Stair climbing will tire you more than you think, you may need to stop part way and rest.    2. No lifting or straining for 2 weeks over 10 pounds. No pushing, pulling, straining for 2 weeks.   3. No driving for minimum 24 hours after surgery.  Do not drive if you are taking narcotic pain medicine and make sure that your reaction time has returned.    4. You can shower as soon as the next day after surgery. Shower daily. No tub baths or submerging your body in water until cleared by your surgeon for at least 2 weeks. If you have the soap that was given to you by pre-surgical testing that was used before surgery, you do not need to use it afterwards because this can irritate your incisions.    5. No sexual activity and nothing in the vagina for 2 weeks minimum.   6. You may experience vaginal spotting and discharge after surgery.  The spotting is normal but if you experience heavy bleeding, call our office.   7. Take Tylenol for pain if you are able to take these medication and only use narcotic pain medication for severe pain not relieved by the Tylenol.  Monitor your Tylenol intake to a max of 4,000 mg in a 24 hour period.    Diet: 1. Low sodium Heart Healthy Diet is recommended but you are cleared to resume your normal (before surgery) diet after your procedure.   2. It is safe to use a laxative, such as Miralax or Colace, if you have difficulty moving your bowels.    Wound Care: 1. Keep clean and dry.  Shower daily.   Reasons to call the Doctor: Fever - Oral temperature greater than 100.4 degrees Fahrenheit Foul-smelling vaginal discharge Difficulty urinating Nausea and vomiting Increased pain at the site of the incision that is unrelieved with pain medicine. Difficulty  breathing with or without chest pain New calf pain especially if only on one side Sudden, continuing increased vaginal bleeding with or without clots.   Contacts: For questions or concerns you should contact:   Dr. Eugene Garnet at [phone]   Warner Mccreedy, NP at [phone]   After Hours: call [phone] and have the GYN Oncologist paged/contacted (after 5 pm or on the weekends).   Messages sent via mychart are for non-urgent matters and are not responded to after hours so for urgent needs, please call the after hours number.

## 2024-02-07 NOTE — Telephone Encounter (Signed)
 Called Dr Frutoso Chase office, they are working on the clearance form

## 2024-02-07 NOTE — Telephone Encounter (Signed)
 Done

## 2024-02-07 NOTE — Telephone Encounter (Signed)
 Copied from CRM [id number]. Topic: General - Other >> Feb 07, 2024  3:37 PM Elizebeth Brooking wrote: Reason for CRM: Marcelino Duster from [facility] called in regarding the status of the urgent medical clearance form, she stated that surgery os scheduled for Wednesday so need that back before than

## 2024-02-08 ENCOUNTER — Telehealth: Payer: Self-pay

## 2024-02-08 NOTE — Anesthesia Preprocedure Evaluation (Addendum)
 Anesthesia Evaluation  Patient identified by MRN, date of birth, ID band Patient awake    Reviewed: Allergy & Precautions, NPO status , Patient's Chart, lab work & pertinent test results  History of Anesthesia Complications Negative for: history of anesthetic complications  Airway Mallampati: II  TM Distance: >3 FB Neck ROM: Full    Dental no notable dental hx. (+) Teeth Intact, Dental Advisory Given   Pulmonary asthma , sleep apnea and Continuous Positive Airway Pressure Ventilation    Pulmonary exam normal breath sounds clear to auscultation       Cardiovascular hypertension, (-) angina (-) Past MI Normal cardiovascular exam Rhythm:Regular Rate:Normal  2023 Echo Left ventricle: The cavity size was normal. Wall thickness   was increased in a pattern of moderate LVH. Systolic   function was normal. The estimated ejection fraction was   in the range of 60% to 65%. Wall motion was normal; there   were no regional wall motion abnormalities. Doppler   parameters are consistent with abnormal left ventricular   relaxation (grade 1 diastolic dysfunction). - Aortic valve: There was no stenosis. Mild regurgitation. - Mitral valve: Trivial regurgitation. - Left atrium: The atrium was mildly dilated. - Right ventricle: The cavity size was normal. Systolic   function was normal. - Pulmonary arteries: PA peak pressure: 24mm Hg (S). - Inferior vena cava: The vessel was normal in size; the   respirophasic diameter changes were in the normal range (=   50%); findings are consistent with normal central venous   pressure.    Neuro/Psych  Headaches    GI/Hepatic Neg liver ROS,GERD  Controlled and Medicated,,  Endo/Other  negative endocrine ROS    Renal/GU Lab Results      Component                Value               Date                                 K                        4.1                 12/30/2023                CO2                       28                  12/30/2023                BUN                      7                   12/30/2023                CREATININE               1.09                12/30/2023                GFR  47.86 (L)           12/30/2023                      Musculoskeletal  (+) Arthritis ,    Abdominal   Peds  Hematology Lab Results      Component                Value               Date                      WBC                      5.7                 12/30/2023                HGB                      12.8                12/30/2023                HCT                      39.1                12/30/2023                MCV                      95.0                12/30/2023                PLT                      294.0               12/30/2023              Anesthesia Other Findings   Reproductive/Obstetrics                             Anesthesia Physical Anesthesia Plan  ASA: 3  Anesthesia Plan: General   Post-op Pain Management: Ofirmev IV (intra-op)*   Induction: Intravenous  PONV Risk Score and Plan: 4 or greater and Treatment may vary due to age or medical condition and Ondansetron  Airway Management Planned: LMA  Additional Equipment: None  Intra-op Plan:   Post-operative Plan:   Informed Consent: I have reviewed the patients History and Physical, chart, labs and discussed the procedure including the risks, benefits and alternatives for the proposed anesthesia with the patient or authorized representative who has indicated his/her understanding and acceptance.     Dental advisory given  Plan Discussed with: CRNA and Surgeon  Anesthesia Plan Comments: (See PAT note from 3/11 by Sherlie Ban PA-C )        Anesthesia Quick Evaluation

## 2024-02-08 NOTE — Telephone Encounter (Addendum)
 Telephone call to, [name], pt's daughter to check on pre-operative status.  Patient compliant with pre-operative instructions.  Reinforced nothing to eat after midnight. Clear liquids until 0800. Patient to arrive at 0900.  No questions or concerns voiced.  Instructed to call for any needs.

## 2024-02-08 NOTE — Telephone Encounter (Signed)
 Received clearance form

## 2024-02-08 NOTE — Progress Notes (Addendum)
 Case: [id number] Date/Time: 02/09/24 0815   Procedures:      DILATATION & CURETTAGE/HYSTEROSCOPY WITH POSSIBLE MYOSURE     POSSILE OPERATIVE ULTRASOUND GUIDANCE   Anesthesia type: Choice   Pre-op diagnosis: COLOUTERINE FISTULA, THICKENED ENDOMETRIUM   Location: [facility] ROOM 05 / [facility]   Surgeons: Carver Fila, MD       DISCUSSION: Heather Grant is an 83 yo female who presents to PAT prior to surgery above. PMH of HTN, OSA (mild, no CPAP), nocturnal hypoxia on home O2, asthma, migraines, diverticulitis with colouterine fistula, arthritis.   Patient admitted from 11/12/23-11/28/23 for endometritis caused by a colouterine fistula due to diverticulitis. She underwent hysteroscopy D&C due to cervical stenosis to allow drainage of abscess. Culture grew E. coli, Streptococcus constellatus and bacterial fragilis. ID was consulted and patient finished broad spectrum abx course. Now scheduled for repeat D&C to r/o malignancy now that infection has cleared. Hospital course c/b A.fib with RVR. Per hospitalist: "new onset afib on 12/18; suspect precipitated in setting of infection. Improved with metoprolol but it was later stopped because of bradycardia. Currently normal sinus rhythm without AV nodal blocking agent. Since A-fib was short-lived in the setting of infection, anticoagulation was not considered"  Patient has followed up with ID on 12/22/23. Noted to be doing well. Labs improving. Has f/u on 3/18.  Patient previously followed with Pulmonology for management of asthma, now follows with just PCP. Uses albuterol and dulera. Patient also previously had a sleep study which showed mild OSA and does not require CPAP. She does have nocturnal hypoxemia and is prescribed 2L O2 at night. It appears patient may be using O2 as needed however.  Last seen by PCP on 12/24/23 after hospital f/u. BP controlled. Prescribed tramadol for knee pain. Medical clearance signed patient is low risk and optimized on  02/07/24 (scanned in media on 3/11)  VS: Ht 4\' 9"  (1.448 m)   Wt 68 kg   BMI 32.46 kg/m   PROVIDERS: Myrlene Broker, MD   LABS: obtain DOS  Labs Reviewed - No data to display   IMAGES:   EKG 11/19/23:  NSR, rate 64 RBBB Inverted T wave in lead III  CV:  Echo 02/10/2012: Study Conclusions  - Left ventricle: The cavity size was normal. Wall thickness   was increased in a pattern of moderate LVH. Systolic   function was normal. The estimated ejection fraction was   in the range of 60% to 65%. Wall motion was normal; there   were no regional wall motion abnormalities. Doppler   parameters are consistent with abnormal left ventricular   relaxation (grade 1 diastolic dysfunction). - Aortic valve: There was no stenosis. Mild regurgitation. - Mitral valve: Trivial regurgitation. - Left atrium: The atrium was mildly dilated. - Right ventricle: The cavity size was normal. Systolic   function was normal. - Pulmonary arteries: PA peak pressure: 24mm Hg (S). - Inferior vena cava: The vessel was normal in size; the   respirophasic diameter changes were in the normal range (=   50%); findings are consistent with normal central venous   pressure.    Past Medical History:  Diagnosis Date   Anemia    Arthritis    hands and knees. May be hips   Gout    great toe, ankles   History of home oxygen therapy    prn   Hyperlipidemia    Hypertension    Migraines    Moderate persistent asthma    Nocturnal  dyspnea    ONO 2010 with several readings  below 88%   Obesity, Class II, BMI 35-39.9    OSA (obstructive sleep apnea)    mild by study Dec '09. AHI 5.7   Plantar fasciitis    Varicella     Past Surgical History:  Procedure Laterality Date   CARPAL TUNNEL RELEASE     right   cataract     right eye with IOL   COLONOSCOPY WITH PROPOFOL N/A 04/26/2018   Procedure: COLONOSCOPY WITH PROPOFOL;  Surgeon: Sherrilyn Rist, MD;  Location: [facility];  Service:  Gastroenterology;  Laterality: N/A;   DILATION AND CURETTAGE OF UTERUS     DILATION AND EVACUATION N/A 11/15/2023   Procedure: Cervical Dilation;  Surgeon: Osborn Coho, MD;  Location: [facility] OR;  Service: Gynecology;  Laterality: N/A;   G5P4     NSVD    MEDICATIONS: No current facility-administered medications for this encounter.    acetaminophen (TYLENOL) 325 MG tablet   albuterol (VENTOLIN HFA) 108 (90 Base) MCG/ACT inhaler   colchicine 0.6 MG tablet   DULERA 100-5 MCG/ACT AERO   hydrALAZINE (APRESOLINE) 25 MG tablet   LUMIGAN 0.01 % SOLN   OXYGEN   Polyvinyl Alcohol (LUBRICANT DROPS OP)   rosuvastatin (CRESTOR) 20 MG tablet   traMADol (ULTRAM) 50 MG tablet   VOLTAREN 1 % GEL   Ubaldo Glassing, PA-C [facility] Short Stay/Anesthesiology [facility] Phone [phone] 02/08/2024 8:59 AM

## 2024-02-09 ENCOUNTER — Ambulatory Visit (HOSPITAL_BASED_OUTPATIENT_CLINIC_OR_DEPARTMENT_OTHER)

## 2024-02-09 ENCOUNTER — Other Ambulatory Visit: Payer: Self-pay

## 2024-02-09 ENCOUNTER — Ambulatory Visit (HOSPITAL_COMMUNITY)
Admission: RE | Admit: 2024-02-09 | Discharge: 2024-02-09 | Discharge status: Home / Self Care | Source: Ambulatory Visit | Attending: Gynecologic Oncology | Admitting: Gynecologic Oncology

## 2024-02-09 ENCOUNTER — Encounter (HOSPITAL_COMMUNITY): Payer: Self-pay | Admitting: Gynecologic Oncology

## 2024-02-09 ENCOUNTER — Ambulatory Visit (HOSPITAL_COMMUNITY)

## 2024-02-09 ENCOUNTER — Ambulatory Visit (HOSPITAL_COMMUNITY)
Admission: RE | Admit: 2024-02-09 | Discharge: 2024-02-09 | Disposition: A | Discharge status: Home / Self Care | Payer: 59 | Attending: Gynecologic Oncology | Admitting: Gynecologic Oncology

## 2024-02-09 ENCOUNTER — Encounter (HOSPITAL_COMMUNITY)
Admission: RE | Disposition: A | Discharge status: Home / Self Care | Payer: Self-pay | Source: Home / Self Care | Attending: Gynecologic Oncology

## 2024-02-09 DIAGNOSIS — N824 Other female intestinal-genital tract fistulae: Secondary | ICD-10-CM | POA: Insufficient documentation

## 2024-02-09 DIAGNOSIS — R9389 Abnormal findings on diagnostic imaging of other specified body structures: Secondary | ICD-10-CM

## 2024-02-09 DIAGNOSIS — Z79899 Other long term (current) drug therapy: Secondary | ICD-10-CM | POA: Insufficient documentation

## 2024-02-09 DIAGNOSIS — J4520 Mild intermittent asthma, uncomplicated: Secondary | ICD-10-CM | POA: Diagnosis not present

## 2024-02-09 DIAGNOSIS — N719 Inflammatory disease of uterus, unspecified: Secondary | ICD-10-CM

## 2024-02-09 DIAGNOSIS — J4540 Moderate persistent asthma, uncomplicated: Secondary | ICD-10-CM | POA: Diagnosis not present

## 2024-02-09 DIAGNOSIS — Z9981 Dependence on supplemental oxygen: Secondary | ICD-10-CM | POA: Diagnosis not present

## 2024-02-09 DIAGNOSIS — G4733 Obstructive sleep apnea (adult) (pediatric): Secondary | ICD-10-CM | POA: Diagnosis not present

## 2024-02-09 DIAGNOSIS — K219 Gastro-esophageal reflux disease without esophagitis: Secondary | ICD-10-CM | POA: Insufficient documentation

## 2024-02-09 DIAGNOSIS — I10 Essential (primary) hypertension: Secondary | ICD-10-CM

## 2024-02-09 DIAGNOSIS — Z5982 Transportation insecurity: Secondary | ICD-10-CM | POA: Insufficient documentation

## 2024-02-09 DIAGNOSIS — D649 Anemia, unspecified: Secondary | ICD-10-CM

## 2024-02-09 HISTORY — DX: Dependence on supplemental oxygen: Z99.81

## 2024-02-09 HISTORY — PX: DILATATION & CURETTAGE/HYSTEROSCOPY WITH MYOSURE: SHX6511

## 2024-02-09 HISTORY — DX: Anemia, unspecified: D64.9

## 2024-02-09 HISTORY — PX: OPERATIVE ULTRASOUND: SHX5996

## 2024-02-09 LAB — CBC
HCT: 39.9 % (ref 36.0–46.0)
Hemoglobin: 12.4 g/dL (ref 12.0–15.0)
MCH: 30.2 pg (ref 26.0–34.0)
MCHC: 31.1 g/dL (ref 30.0–36.0)
MCV: 97.3 fL (ref 80.0–100.0)
Platelets: 258 10*3/uL (ref 150–400)
RBC: 4.1 MIL/uL (ref 3.87–5.11)
RDW: 13.4 % (ref 11.5–15.5)
WBC: 3.9 10*3/uL — ABNORMAL LOW (ref 4.0–10.5)
nRBC: 0 % (ref 0.0–0.2)

## 2024-02-09 LAB — BASIC METABOLIC PANEL
Anion gap: 9 (ref 5–15)
BUN: 15 mg/dL (ref 8–23)
CO2: 25 mmol/L (ref 22–32)
Calcium: 10 mg/dL (ref 8.9–10.3)
Chloride: 106 mmol/L (ref 98–111)
Creatinine, Ser: 1.12 mg/dL — ABNORMAL HIGH (ref 0.44–1.00)
GFR, Estimated: 50 mL/min — ABNORMAL LOW (ref >60)
Glucose, Bld: 87 mg/dL (ref 70–99)
Potassium: 3.9 mmol/L (ref 3.5–5.1)
Sodium: 140 mmol/L (ref 135–145)

## 2024-02-09 MED ORDER — FENTANYL CITRATE (PF) 100 MCG/2ML IJ SOLN
INTRAMUSCULAR | Status: AC
  Filled 2024-02-09: qty 2

## 2024-02-09 MED ORDER — ACETAMINOPHEN 500 MG PO TABS
1000.0000 mg | ORAL_TABLET | ORAL | Status: AC
  Administered 2024-02-09: 1000 mg via ORAL
  Filled 2024-02-09: qty 2

## 2024-02-09 MED ORDER — DEXAMETHASONE SODIUM PHOSPHATE 10 MG/ML IJ SOLN
INTRAMUSCULAR | Status: DC | PRN
  Administered 2024-02-09: 8 mg via INTRAVENOUS

## 2024-02-09 MED ORDER — LIDOCAINE HCL (PF) 2 % IJ SOLN
INTRAMUSCULAR | Status: DC | PRN
Start: 2024-02-09 — End: 2024-02-09
  Administered 2024-02-09: 60 mg via INTRADERMAL

## 2024-02-09 MED ORDER — AMISULPRIDE (ANTIEMETIC) 5 MG/2ML IV SOLN: 10.0000 mg | Freq: Once | INTRAVENOUS | Status: DC | PRN

## 2024-02-09 MED ORDER — FENTANYL CITRATE PF 50 MCG/ML IJ SOSY: 25.0000 ug | PREFILLED_SYRINGE | INTRAMUSCULAR | Status: DC | PRN

## 2024-02-09 MED ORDER — EPHEDRINE SULFATE-NACL 50-0.9 MG/10ML-% IV SOSY
PREFILLED_SYRINGE | INTRAVENOUS | Status: DC | PRN
  Administered 2024-02-09 (×2): 5 mg via INTRAVENOUS

## 2024-02-09 MED ORDER — CHLORHEXIDINE GLUCONATE 0.12 % MT SOLN
15.0000 mL | Freq: Once | OROMUCOSAL | Status: AC
  Administered 2024-02-09: 15 mL via OROMUCOSAL

## 2024-02-09 MED ORDER — PROPOFOL 10 MG/ML IV BOLUS
INTRAVENOUS | Status: AC
  Filled 2024-02-09: qty 20

## 2024-02-09 MED ORDER — FENTANYL CITRATE (PF) 100 MCG/2ML IJ SOLN
INTRAMUSCULAR | Status: DC | PRN
  Administered 2024-02-09 (×2): 25 ug via INTRAVENOUS

## 2024-02-09 MED ORDER — ORAL CARE MOUTH RINSE: 15.0000 mL | Freq: Once | OROMUCOSAL | Status: AC

## 2024-02-09 MED ORDER — PROPOFOL 10 MG/ML IV BOLUS
INTRAVENOUS | Status: DC | PRN
  Administered 2024-02-09: 140 mg via INTRAVENOUS

## 2024-02-09 MED ORDER — LIDOCAINE HCL 1 % IJ SOLN
INTRAMUSCULAR | Status: DC | PRN
  Administered 2024-02-09: 10 mL

## 2024-02-09 MED ORDER — ONDANSETRON HCL 4 MG/2ML IJ SOLN
INTRAMUSCULAR | Status: DC | PRN
  Administered 2024-02-09: 4 mg via INTRAVENOUS

## 2024-02-09 MED ORDER — DEXAMETHASONE SODIUM PHOSPHATE 4 MG/ML IJ SOLN: 4.0000 mg | INTRAMUSCULAR | Status: DC

## 2024-02-09 MED ORDER — LACTATED RINGERS IV SOLN
INTRAVENOUS | Status: DC
Start: 2024-02-09 — End: 2024-02-09

## 2024-02-09 MED ORDER — LIDOCAINE HCL (PF) 1 % IJ SOLN
INTRAMUSCULAR | Status: AC
  Filled 2024-02-09: qty 30

## 2024-02-09 NOTE — Op Note (Signed)
 OPERATIVE NOTE  PATIENT: Heather Grant DATE: 02/09/24  Preop Diagnosis: Thickened endometrium, known colouterine fistula  Postoperative Diagnosis: same as above  Surgery: Hysteroscopy, endometrial sampling with Myosure, ultrasound guidance  Surgeons:  Eugene Garnet MD  Assistant: none  Anesthesia: General   Estimated blood loss: 10 ml  IVF:  see I&O flowsheet   Urine output: n/a   Fluid deficit: 285 cc  Complications: None apparent  Pathology: endometrial curetteings, cervical biopsy at 12 o'clock  Operative findings: Minimal purulent appearing discharge noted at the vaginal introitus after vaginal prep performed. On EUA, mobile 8-10 cm uterus. No rectovaginal fistula appreciated. Cervix mildly indurated, normal in appearance although flush with the vagina anteriorly. Cervix easily dilated to 71F. Uterus cavity with inflamed endometrium, areas of calcification anteriorly. NO pus noted within the uterine cavity, no fecal contents. Posteriorly, along the entire posterior wall, there was smooth tissue that pulsed with the irrigation. Appearance more typical of a polyp, but no clear origin of the polyp and unable to clearly see the posterior uterine wall (to assure that there was not colon prolapsing into the uterus). Endometrial sampling done circumferentially although avoiding the posterior wall.      Procedure: The patient was identified in the preoperative holding area. Informed consent was signed on the chart. Patient was seen history was reviewed and exam was performed.   The patient was then taken to the operating room and placed in the supine position with SCD hose on. General anesthesia was then induced without difficulty. She was then placed in the dorsolithotomy position. The perineum was prepped with Betadine. The vagina was prepped with CHG. The patient was then draped after the prep was dried.   Timeout was performed the patient, procedure,  antibiotic, allergy, and length of procedure.   The speculum was placed in the posterior vagina. The single tooth tenaculum was placed on the anterior lip of the cervix. The uterine sound was placed in the cervix and advanced to the fundus. The cervix was successively dilated using pratts dilators to 71F.  The Myosure hysteroscope was inserted and the uterine cavity inspected as above. Given difficulty defining the posterior anatomy, bedside transabdominal ultrasound was performed. It was difficult to tell if appearance of the posterior uterine wall was due to a polyp lesion or related to her colouterine fistula.   Using the Myosure reach, the anterior, left and right endometrium were sampled. Tissue was collected in the specimen sock to be sent to pathology.   Pictures were taken. The camera was removed. The tenaculum was removed and hemostasis was observed.   Cervical biopsy was taken at 12 o'clock. Paracervical block with 1% lidocaine was injected.   The vagina was irrigated.  All instrument, suture, laparotomy, Ray-Tec, and needle counts were correct x2. The patient tolerated the procedure well and was taken recovery room in stable condition.   Carver Fila, MD

## 2024-02-09 NOTE — Interval H&P Note (Signed)
 History and Physical Interval Note:  02/09/2024 10:26 AM  Heather Grant  has presented today for surgery, with the diagnosis of COLOUTERINE FISTULA, THICKENED ENDOMETRIUM.  The various methods of treatment have been discussed with the patient and family. After consideration of risks, benefits and other options for treatment, the patient has consented to  Procedure(s): DILATATION & CURETTAGE/HYSTEROSCOPY WITH POSSIBLE MYOSURE (N/A) POSSILE OPERATIVE ULTRASOUND GUIDANCE (N/A) as a surgical intervention.  The patient's history has been reviewed, patient examined, no change in status, stable for surgery.  I have reviewed the patient's chart and labs.  Questions were answered to the patient's satisfaction.    The risks of surgery were discussed in detail and she understands these to include infection; injury to adjacent organs such as bowel, bladder, blood vessels, ureters and nerves; bleeding which may require blood transfusion; anesthesia risk; thromboembolic events; possible death; unforeseen complications; possible need for re-exploration; medical complications such as heart attack, stroke, pleural effusion and pneumonia. The patient will receive DVT and antibiotic prophylaxis as indicated. She voiced a clear understanding. She had the opportunity to ask questions.   Carver Fila

## 2024-02-09 NOTE — Anesthesia Procedure Notes (Signed)
 Procedure Name: LMA Insertion Date/Time: 02/09/2024 11:12 AM  Performed by: Elisabeth Cara, CRNAPre-anesthesia Checklist: Patient identified, Emergency Drugs available, Suction available, Patient being monitored and Timeout performed Patient Re-evaluated:Patient Re-evaluated prior to induction Oxygen Delivery Method: Circle system utilized Preoxygenation: Pre-oxygenation with 100% oxygen Induction Type: IV induction LMA: LMA with gastric port inserted LMA Size: 4.0 Number of attempts: 1 Placement Confirmation: positive ETCO2 and breath sounds checked- equal and bilateral Tube secured with: Tape Dental Injury: Teeth and Oropharynx as per pre-operative assessment

## 2024-02-09 NOTE — Transfer of Care (Signed)
 Immediate Anesthesia Transfer of Care Note  Patient: Heather Grant  Procedure(s) Performed: DILATATION & CURETTAGE/HYSTEROSCOPY WITH MYOSURE WITH CERVICAL BIOPSY OPERATIVE ULTRASOUND GUIDANCE  Patient Location: PACU  Anesthesia Type:General  Level of Consciousness: awake, alert , oriented, and patient cooperative  Airway & Oxygen Therapy: Patient Spontanous Breathing and Patient connected to face mask oxygen  Post-op Assessment: Report given to RN, Post -op Vital signs reviewed and stable, and Patient moving all extremities  Post vital signs: Reviewed and stable  Last Vitals:  Vitals Value Taken Time  BP 160/141 02/09/24 1215  Temp    Pulse 73 02/09/24 1217  Resp 13 02/09/24 1217  SpO2 100 % 02/09/24 1217  Vitals shown include unfiled device data.  Last Pain:  Vitals:   02/09/24 0937  TempSrc: Oral  PainSc:          Complications: No notable events documented.

## 2024-02-09 NOTE — Anesthesia Postprocedure Evaluation (Signed)
 Anesthesia Post Note  Patient: Heather Grant  Procedure(s) Performed: DILATATION & CURETTAGE/HYSTEROSCOPY WITH MYOSURE WITH CERVICAL BIOPSY OPERATIVE ULTRASOUND GUIDANCE     Patient location during evaluation: PACU Anesthesia Type: General Level of consciousness: awake and alert Pain management: pain level controlled Vital Signs Assessment: post-procedure vital signs reviewed and stable Respiratory status: spontaneous breathing, nonlabored ventilation, respiratory function stable and patient connected to nasal cannula oxygen Cardiovascular status: blood pressure returned to baseline and stable Postop Assessment: no apparent nausea or vomiting Anesthetic complications: no  No notable events documented.  Last Vitals:  Vitals:   02/09/24 1245 02/09/24 1255  BP: (!) 161/69 (!) 185/82  Pulse: 75   Resp: 14   Temp:  36.6 C  SpO2: 98%     Last Pain:  Vitals:   02/09/24 1255  TempSrc: Oral  PainSc:                  [name]

## 2024-02-10 ENCOUNTER — Telehealth: Payer: Self-pay | Admitting: *Deleted

## 2024-02-10 ENCOUNTER — Encounter (HOSPITAL_COMMUNITY): Payer: Self-pay | Admitting: Gynecologic Oncology

## 2024-02-10 LAB — SURGICAL PATHOLOGY

## 2024-02-10 NOTE — Telephone Encounter (Signed)
 Spoke with patient's daughter [name] this morning. She states patient is eating, drinking and urinating well. She has not had a BM yet but is passing gas. Encouraged her to drink plenty of water. She denies fever or chills. She rates her pain 0/10.  Instructed to call office with any fever, chills, purulent drainage, uncontrolled pain or any other questions or concerns. Patient verbalizes understanding.   Pt aware of post op appointments as well as the office number [phone] and after hours number [phone] to call if she has any questions or concerns

## 2024-02-14 ENCOUNTER — Ambulatory Visit (INDEPENDENT_AMBULATORY_CARE_PROVIDER_SITE_OTHER): Payer: 59 | Admitting: Internal Medicine

## 2024-02-14 ENCOUNTER — Encounter (HOSPITAL_COMMUNITY)

## 2024-02-14 ENCOUNTER — Encounter: Payer: Self-pay | Admitting: Internal Medicine

## 2024-02-14 VITALS — BP 116/68 | HR 50 | Temp 97.7°F | Ht <= 58 in | Wt 153.2 lb

## 2024-02-14 DIAGNOSIS — M1009 Idiopathic gout, multiple sites: Secondary | ICD-10-CM

## 2024-02-14 DIAGNOSIS — E785 Hyperlipidemia, unspecified: Secondary | ICD-10-CM

## 2024-02-14 DIAGNOSIS — N824 Other female intestinal-genital tract fistulae: Secondary | ICD-10-CM

## 2024-02-14 DIAGNOSIS — E782 Mixed hyperlipidemia: Secondary | ICD-10-CM | POA: Diagnosis not present

## 2024-02-14 DIAGNOSIS — J4540 Moderate persistent asthma, uncomplicated: Secondary | ICD-10-CM

## 2024-02-14 DIAGNOSIS — Z0000 Encounter for general adult medical examination without abnormal findings: Secondary | ICD-10-CM

## 2024-02-14 LAB — LIPID PANEL
Cholesterol: 127 mg/dL (ref 0–200)
HDL: 48 mg/dL (ref >39.00)
LDL Cholesterol: 67 mg/dL (ref 0–99)
NonHDL: 78.95
Total CHOL/HDL Ratio: 3
Triglycerides: 60 mg/dL (ref 0.0–149.0)
VLDL: 12 mg/dL (ref 0.0–40.0)

## 2024-02-14 LAB — URIC ACID: Uric Acid, Serum: 7.7 mg/dL — ABNORMAL HIGH (ref 2.4–7.0)

## 2024-02-14 MED ORDER — HYDRALAZINE HCL 25 MG PO TABS: 25.0000 mg | ORAL_TABLET | Freq: Three times a day (TID) | ORAL | 3 refills | Status: DC

## 2024-02-14 MED ORDER — DULERA 100-5 MCG/ACT IN AERO: INHALATION_SPRAY | RESPIRATORY_TRACT | 3 refills | Status: DC

## 2024-02-14 MED ORDER — ROSUVASTATIN CALCIUM 20 MG PO TABS: 20.0000 mg | ORAL_TABLET | Freq: Every day | ORAL | 2 refills | Status: DC

## 2024-02-14 NOTE — Progress Notes (Unsigned)
   Subjective:   Patient ID: Heather Grant, female    DOB: December 28, 1940, 83 y.o.   MRN: [id number]  HPI The patient is here for physical.  PMH, [facility], social history reviewed and updated  Review of Systems  Objective:  Physical Exam  There were no vitals filed for this visit.  Assessment & Plan:

## 2024-02-15 ENCOUNTER — Telehealth: Payer: 59 | Admitting: Internal Medicine

## 2024-02-15 DIAGNOSIS — N824 Other female intestinal-genital tract fistulae: Secondary | ICD-10-CM

## 2024-02-15 DIAGNOSIS — L0291 Cutaneous abscess, unspecified: Secondary | ICD-10-CM | POA: Diagnosis not present
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
## 2024-02-16 ENCOUNTER — Encounter (HOSPITAL_COMMUNITY)

## 2024-02-16 ENCOUNTER — Encounter: Payer: Self-pay | Admitting: Podiatry

## 2024-02-16 ENCOUNTER — Inpatient Hospital Stay: Attending: Gynecologic Oncology | Admitting: Gynecologic Oncology

## 2024-02-16 ENCOUNTER — Ambulatory Visit (HOSPITAL_COMMUNITY)

## 2024-02-16 ENCOUNTER — Encounter: Payer: Self-pay | Admitting: Gynecologic Oncology

## 2024-02-16 ENCOUNTER — Ambulatory Visit (INDEPENDENT_AMBULATORY_CARE_PROVIDER_SITE_OTHER): Payer: 59 | Admitting: Podiatry

## 2024-02-16 DIAGNOSIS — M79676 Pain in unspecified toe(s): Secondary | ICD-10-CM

## 2024-02-16 DIAGNOSIS — M79672 Pain in left foot: Secondary | ICD-10-CM

## 2024-02-16 DIAGNOSIS — R9389 Abnormal findings on diagnostic imaging of other specified body structures: Secondary | ICD-10-CM | POA: Diagnosis not present

## 2024-02-16 DIAGNOSIS — B351 Tinea unguium: Secondary | ICD-10-CM

## 2024-02-16 DIAGNOSIS — Q828 Other specified congenital malformations of skin: Secondary | ICD-10-CM

## 2024-02-16 DIAGNOSIS — Z7189 Other specified counseling: Secondary | ICD-10-CM | POA: Diagnosis not present

## 2024-02-16 DIAGNOSIS — N824 Other female intestinal-genital tract fistulae: Secondary | ICD-10-CM | POA: Diagnosis not present

## 2024-02-16 DIAGNOSIS — M79671 Pain in right foot: Secondary | ICD-10-CM
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
## 2024-02-17 ENCOUNTER — Ambulatory Visit (HOSPITAL_COMMUNITY): Payer: 59

## 2024-02-18 ENCOUNTER — Encounter: Payer: Self-pay | Admitting: Internal Medicine

## 2024-02-18 NOTE — Assessment & Plan Note (Signed)
 No flare today and using dulera regularly. Albuterol as needed. Continue.

## 2024-02-18 NOTE — Assessment & Plan Note (Signed)
 Flu shot up to date. Pneumonia complete. Shingrix due at pharmacy. Tetanus due at pharmacy. Colonoscopy aged out but upcoming for fistula. Mammogram aged out, pap smear aged out and dexa complete. Counseled about sun safety and mole surveillance. Counseled about the dangers of distracted driving. Given 10 year screening recommendations.

## 2024-02-18 NOTE — Assessment & Plan Note (Signed)
 Checking lipid panel and adjust crestor as needed.

## 2024-02-18 NOTE — Assessment & Plan Note (Signed)
 Checking uric acid and adjust as needed. Uses colchicine for flares prn. No flare currently.

## 2024-02-18 NOTE — Assessment & Plan Note (Signed)
 Has upcoming colonoscopy and monitoring with gyn/onc.

## 2024-02-21 ENCOUNTER — Encounter (HOSPITAL_COMMUNITY): Payer: Self-pay | Admitting: Gastroenterology

## 2024-02-21 ENCOUNTER — Telehealth: Payer: Self-pay | Admitting: *Deleted

## 2024-02-21 ENCOUNTER — Encounter: Payer: Self-pay | Admitting: Gynecologic Oncology

## 2024-02-21 ENCOUNTER — Telehealth: Payer: Self-pay | Admitting: Gastroenterology

## 2024-02-21 NOTE — Progress Notes (Signed)
  Subjective:  Patient ID: Heather Grant, female    DOB: 02-04-1941,  MRN: [id number]  83 y.o. female presents painful porokeratotic lesion(s) left lower extremity and painful mycotic toenails that limit ambulation. Painful toenails interfere with ambulation. Aggravating factors include wearing enclosed shoe gear. Pain is relieved with periodic professional debridement. Painful porokeratotic lesions are aggravated when weightbearing with and without shoegear. Pain is relieved with periodic professional debridement.  Chief Complaint  Patient presents with   Nail Problem    "I come to get my toenails cut."   New problem(s): None   PCP is Myrlene Broker, MD , and last visit was February 14, 2024.  No Known Allergies  Review of Systems: Negative except as noted in the HPI.   Objective:  Heather Grant is a pleasant 83 y.o. female WD, WN in NAD. AAO x 3.  Vascular Examination: Vascular status intact b/l with palpable pedal pulses. CFT immediate b/l. Pedal hair diminished. No edema. No pain with calf compression b/l. Skin temperature gradient WNL b/l. No varicosities noted. No cyanosis or clubbing noted.  Neurological Examination: Sensation grossly intact b/l with 10 gram monofilament. Vibratory sensation intact b/l.  Dermatological Examination: Pedal skin with normal turgor, texture and tone b/l. No open wounds nor interdigital macerations noted. Toenails 1-5 b/l thick, discolored, elongated with subungual debris and pain on dorsal palpation. Porokeratotic lesion(s) left third digit and sub 5th met base left lower extremity. No erythema, no edema, no drainage, no fluctuance.   Musculoskeletal Examination: Muscle strength 5/5 to b/l LE.  No pain, crepitus noted b/l. HAV with bunion deformity noted b/l LE. Hammertoe deformity noted 2-5 b/l. Utilizes rollator for ambulation assistance.  Radiographs: None  Last A1c:       No data to display          Assessment:    1. Pain due to onychomycosis of toenail   2. Porokeratosis   3. Pain in both feet    Plan:  Consent given for treatment. Patient examined. All patient's and/or POA's questions/concerns addressed on today's visit. Mycotic toenails 1-5 debrided in length and girth without incident. Porokeratotic lesion(s) left third digit and sub 5th met base left lower extremity pared and enucleated with sharp debridement without incident.Continue soft, supportive shoe gear daily. Report any pedal injuries to medical professional. Call office if there are any quesitons/concerns. -Patient/POA to call should there be question/concern in the interim.  Return in about 3 months (around 05/18/2024).  Freddie Breech, DPM      [place] LOCATION: [address] [place] [address]                   Office [phone]   [facility] LOCATION: [address] [place], [place] [address] Office [phone]

## 2024-02-21 NOTE — Telephone Encounter (Signed)
 Procedure:02/28/24 Procedure date: colon Procedure location: wl Arrival Time: 9am Spoke with the patient Y/N: y Any prep concerns? y  Has the patient obtained the prep from the pharmacy ? Y and  Do you have a care partner and transportation: y Any additional concerns?  Pt state she has a concern about her moms fissula. She would like to know if this procedure will effect her at all?

## 2024-02-21 NOTE — Telephone Encounter (Signed)
 Daughter concerned that she has not met Dr Myrtie Neither. Unclear on the goal of the colonoscopy and if it will affect the fistula.

## 2024-02-21 NOTE — Telephone Encounter (Signed)
 Left message to call me back. I am not sure which daughter called. I called the telephone number given which is the patient's number.

## 2024-02-21 NOTE — Telephone Encounter (Signed)
 Inbound call from patient's daughter stating she is returning call to nurse regarding 3/31 procedure. Please advise, thank you.

## 2024-02-21 NOTE — Telephone Encounter (Signed)
 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  Surgery was recommending a colonoscopy to evaluate the colon and rule out a colon mass or other abnormalities that may be contributing to this problem and potentially affect any surgical procedure that is done for that fistula.  So I am aware of this issue, I thoroughly reviewed the APP's office consult note and the December 2024 CT abdomen and pelvis.  We often perform a colonoscopy in similar scenarios, and I feel it is okay to proceed. This patient will meet me in the endoscopy lab before the procedure, and her daughter (if she accompanies her mother that day) will meet me in the postprocedure area.  Ellwood Dense MD

## 2024-02-21 NOTE — Telephone Encounter (Signed)
 Share this information with [name], daughter of the patient. She expresses her satisfaction with this information and that it "eases" her mind. Encouraged to call with any other questions or concerns.

## 2024-02-21 NOTE — Telephone Encounter (Signed)
 Spoke with patient's daughter [name] and relayed message from Warner Mccreedy, NP that the light bleeding Heather Grant is experiencing is normal and can last for several weeks. [name] verbalized understanding and stated her mother is only wearing a panty liner and changes it not that often and the bleeding is very light and spotty. Advised LaDoona if her mother experiences any increased bleeding with or without clots to reach back out to the office. [name] thanked the office for calling.

## 2024-02-22 ENCOUNTER — Ambulatory Visit: Payer: Self-pay | Admitting: Licensed Clinical Social Worker

## 2024-02-22 NOTE — Patient Outreach (Signed)
 Care Coordination   Follow Up Visit Note   02/22/2024 Name: Heather Grant MRN: [id number] DOB: 09/22/1941  Heather Grant is a 83 y.o. year old female who sees Heather Broker, MD for primary care. I spoke with  Heather Grant by phone today.  What matters to the patients health and wellness today?  Symptom Management    Goals Addressed             This Visit's Progress    Obtain Supportive Resources   On track    Activities and task to complete in order to accomplish goals.   Keep all upcoming appointments discussed today Continue with compliance of taking medication prescribed by Doctor Implement healthy coping skills discussed to assist with management of symptoms           SDOH assessments and interventions completed:  No     Care Coordination Interventions:  Yes, provided  Interventions Today    Flowsheet Row Most Recent Value  Chronic Disease   Chronic disease during today's visit Chronic Obstructive Pulmonary Disease (COPD)  General Interventions   General Interventions Discussed/Reviewed General Interventions Reviewed, Doctor Visits  [Patient reports management of symptoms, reports she has not been experiencing any pain or discomfort.]  Doctor Visits Discussed/Reviewed Doctor Visits Reviewed, Specialist  [Upcoming appts and procedures reviewed]  Mental Health Interventions   Mental Health Discussed/Reviewed Mental Health Reviewed, Coping Strategies, Anxiety  Nutrition Interventions   Nutrition Discussed/Reviewed Nutrition Reviewed  Pharmacy Interventions   Pharmacy Dicussed/Reviewed Pharmacy Topics Reviewed  Safety Interventions   Safety Discussed/Reviewed Safety Reviewed       Follow up plan: Follow up call scheduled for 2-4 weeks    Encounter Outcome:  Patient Visit Completed   Jenel Lucks, LCSW [place]  [facility], [facility] Clinical Social Worker Direct Dial: [phone]  Fax:  [phone] Website: [email or web address] 11:29 AM

## 2024-02-22 NOTE — Patient Instructions (Signed)
 Visit Information  Thank you for taking time to visit with me today. Please don't hesitate to contact me if I can be of assistance to you.   Following are the goals we discussed today:   Goals Addressed             This Visit's Progress    Obtain Supportive Resources   On track    Activities and task to complete in order to accomplish goals.   Keep all upcoming appointments discussed today Continue with compliance of taking medication prescribed by Doctor Implement healthy coping skills discussed to assist with management of symptoms           Our next appointment is by telephone on 4/29 at 11:30 AM  Please call the care guide team at [phone] if you need to cancel or reschedule your appointment.   If you are experiencing a Mental Health or Behavioral Health Crisis or need someone to talk to, please call the Suicide and Crisis Lifeline: 988 call 911   Patient verbalizes understanding of instructions and care plan provided today and agrees to view in MyChart. Active MyChart status and patient understanding of how to access instructions and care plan via MyChart confirmed with patient.     Windy Fast [facility]  [facility], [facility] Clinical Social Worker Direct Dial: [phone]  Fax: [phone] Website: [email or web address] 11:30 AM

## 2024-02-28 ENCOUNTER — Other Ambulatory Visit: Payer: Self-pay

## 2024-02-28 ENCOUNTER — Ambulatory Visit (HOSPITAL_COMMUNITY): Payer: Self-pay | Admitting: Physician Assistant

## 2024-02-28 ENCOUNTER — Ambulatory Visit (HOSPITAL_COMMUNITY)
Admission: RE | Admit: 2024-02-28 | Discharge: 2024-02-28 | Disposition: A | Discharge status: Home / Self Care | Payer: 59 | Attending: Gastroenterology | Admitting: Gastroenterology

## 2024-02-28 ENCOUNTER — Encounter (HOSPITAL_COMMUNITY): Payer: Self-pay | Admitting: Gastroenterology

## 2024-02-28 ENCOUNTER — Encounter (HOSPITAL_COMMUNITY)
Admission: RE | Disposition: A | Discharge status: Home / Self Care | Payer: Self-pay | Source: Home / Self Care | Attending: Gastroenterology

## 2024-02-28 DIAGNOSIS — D123 Benign neoplasm of transverse colon: Secondary | ICD-10-CM

## 2024-02-28 DIAGNOSIS — K6289 Other specified diseases of anus and rectum: Secondary | ICD-10-CM

## 2024-02-28 DIAGNOSIS — G4733 Obstructive sleep apnea (adult) (pediatric): Secondary | ICD-10-CM | POA: Insufficient documentation

## 2024-02-28 DIAGNOSIS — K5730 Diverticulosis of large intestine without perforation or abscess without bleeding: Secondary | ICD-10-CM | POA: Insufficient documentation

## 2024-02-28 DIAGNOSIS — I10 Essential (primary) hypertension: Secondary | ICD-10-CM | POA: Insufficient documentation

## 2024-02-28 DIAGNOSIS — N824 Other female intestinal-genital tract fistulae: Secondary | ICD-10-CM | POA: Diagnosis present

## 2024-02-28 DIAGNOSIS — J4540 Moderate persistent asthma, uncomplicated: Secondary | ICD-10-CM | POA: Diagnosis not present

## 2024-02-28 DIAGNOSIS — K648 Other hemorrhoids: Secondary | ICD-10-CM

## 2024-02-28 HISTORY — PX: POLYPECTOMY: SHX5525

## 2024-02-28 HISTORY — PX: COLONOSCOPY WITH PROPOFOL: SHX5780

## 2024-02-28 SURGERY — COLONOSCOPY WITH PROPOFOL
Anesthesia: Monitor Anesthesia Care

## 2024-02-28 MED ORDER — PROPOFOL 1000 MG/100ML IV EMUL
INTRAVENOUS | Status: AC
  Filled 2024-02-28: qty 100

## 2024-02-28 MED ORDER — PROPOFOL 10 MG/ML IV BOLUS
INTRAVENOUS | Status: DC | PRN
Start: 2024-02-28 — End: 2024-02-28
  Administered 2024-02-28 (×3): 10 mg via INTRAVENOUS

## 2024-02-28 MED ORDER — LIDOCAINE HCL 1 % IJ SOLN
INTRAMUSCULAR | Status: DC | PRN
  Administered 2024-02-28: 40 mg via INTRADERMAL

## 2024-02-28 MED ORDER — SODIUM CHLORIDE 0.9 % IV SOLN: INTRAVENOUS | Status: DC

## 2024-02-28 MED ORDER — PROPOFOL 500 MG/50ML IV EMUL
INTRAVENOUS | Status: DC | PRN
  Administered 2024-02-28: 130 ug/kg/min via INTRAVENOUS

## 2024-02-28 SURGICAL SUPPLY — 20 items

## 2024-02-28 NOTE — Discharge Instructions (Signed)

## 2024-02-28 NOTE — Transfer of Care (Signed)
 Immediate Anesthesia Transfer of Care Note  Patient: Heather Grant  Procedure(s) Performed: COLONOSCOPY WITH PROPOFOL POLYPECTOMY  Patient Location: Endoscopy Unit  Anesthesia Type:MAC  Level of Consciousness: awake, alert , oriented, and patient cooperative  Airway & Oxygen Therapy: Patient Spontanous Breathing and Patient connected to face mask oxygen  Post-op Assessment: Report given to RN and Post -op Vital signs reviewed and stable  Post vital signs: Reviewed and stable  Last Vitals:  Vitals Value Taken Time  BP 161/64 02/28/24 1050  Temp    Pulse 70 02/28/24 1057  Resp 18 02/28/24 1057  SpO2 100 % 02/28/24 1057  Vitals shown include unfiled device data.  Last Pain:  Vitals:   02/28/24 1048  TempSrc: (P) Temporal  PainSc: (P) 0-No pain         Complications: No notable events documented.

## 2024-02-28 NOTE — Op Note (Signed)
 [facility] Patient Name: Heather Grant Procedure Date: 02/28/2024 MRN: [id number] Attending MD: Starr Lake. Myrtie Neither , MD, [id number] Date of Birth: 01-07-1941 CSN: [id number] Age: 83 Admit Type: Outpatient Procedure:                Colonoscopy Indications:              Colo-uterine fistula Providers:                Sherilyn Cooter L. Myrtie Neither, MD, Stephens Shire RN, RN, Rhodia Albright, Technician Referring MD:              Medicines:                Monitored Anesthesia Care Complications:            No immediate complications. Estimated Blood Loss:      Procedure:                Pre-Anesthesia Assessment:                           - Prior to the procedure, a History and Physical                            was performed, and patient medications and                            allergies were reviewed. The patient's tolerance of                            previous anesthesia was also reviewed. The risks                            and benefits of the procedure and the sedation                            options and risks were discussed with the patient.                            All questions were answered, and informed consent                            was obtained. Prior Anticoagulants: The patient has                            taken no anticoagulant or antiplatelet agents. ASA                            Grade Assessment: IV - A patient with severe                            systemic disease that is a constant threat to life.  After reviewing the risks and benefits, the patient                            was deemed in satisfactory condition to undergo the                            procedure.                           After obtaining informed consent, the colonoscope                            was passed under direct vision. Throughout the                            procedure, the patient's blood pressure, pulse, and                             oxygen saturations were monitored continuously. The                            PCF-HQ190L ([id number]) Olympus colonoscope was                            introduced through the anus and advanced to the the                            cecum, identified by appendiceal orifice and                            ileocecal valve. The colonoscopy was performed                            without difficulty. The patient tolerated the                            procedure well. The quality of the bowel                            preparation was excellent. The ileocecal valve,                            appendiceal orifice, and rectum were photographed. Scope In: 10:23:56 AM Scope Out: 10:40:43 AM Scope Withdrawal Time: 0 hours 11 minutes 10 seconds  Total Procedure Duration: 0 hours 16 minutes 47 seconds  Findings:      The digital rectal exam findings include decreased sphincter tone.      Repeat examination of right colon under NBI performed.      A diminutive polyp was found in the distal transverse colon. The polyp       was sessile. The polyp was removed with a cold snare. Resection and       retrieval were complete.      Multiple diverticula were found in the left colon. Associated       tortuosity, especially in the recto-sigmoid region.  Internal hemorrhoids were found.      The exam was otherwise without abnormality on direct and retroflexion       views. Impression:               - Decreased sphincter tone found on digital rectal                            exam.                           - One diminutive polyp in the distal transverse                            colon, removed with a cold snare. Resected and                            retrieved.                           - Diverticulosis in the left colon.                           - Internal hemorrhoids.                           - The examination was otherwise normal on direct                            and retroflexion  views. Moderate Sedation:      MAC sedation used Recommendation:           - Patient has a contact number available for                            emergencies. The signs and symptoms of potential                            delayed complications were discussed with the                            patient. Return to normal activities tomorrow.                            Written discharge instructions were provided to the                            patient.                           - Resume previous diet.                           - Continue present medications.                           - Await pathology results.                           -  No repeat colonoscopy due to age, current                            guidelines and low risk findings today.                           - Contact your gynecologist regarding timing of any                            surgical procedure that is to be performed.                           (this report will be forwarded to both gynecology                            and colo-rectal surgeons) Procedure Code(s):        --- Professional ---                           [phone], Colonoscopy, flexible; with removal of                            tumor(s), polyp(s), or other lesion(s) by snare                            technique Diagnosis Code(s):        --- Professional ---                           K62.89, Other specified diseases of anus and rectum                           D12.3, Benign neoplasm of transverse colon (hepatic                            flexure or splenic flexure)                           K64.8, Other hemorrhoids                           K57.30, Diverticulosis of large intestine without                            perforation or abscess without bleeding CPT copyright 2022 American Medical Association. All rights reserved. The codes documented in this report are preliminary and upon coder review may  be revised to meet current compliance  requirements. [name], MD 02/28/2024 10:49:48 AM This report has been signed electronically. Number of Addenda: 0

## 2024-02-28 NOTE — H&P (Signed)
 History and Physical:  This patient presents for endoscopic testing for: Colo uterine fistula Diverticulosis  83 year old woman seen in our office 12/30/2023 for consultation at the request of both gynecology and general surgery for a Coley uterine fistula.  Clinical details outlined in that note.  She denies having abdominal pain today.  Since her last evaluation with [place], she has since seen Dr. Cliffton Asters of colorectal surgery on 01/13/2024, and also had a hysteroscopy with sampling of endometrial tissue in 02/09/2024  Patient is otherwise without complaints or active issues today.   Past Medical History: Past Medical History:  Diagnosis Date   Anemia    Arthritis    hands and knees. May be hips   Gout    great toe, ankles   History of home oxygen therapy    prn   Hyperlipidemia    Hypertension    Migraines    Moderate persistent asthma    Nocturnal dyspnea    ONO 2010 with several readings  below 88%   Obesity, Class II, BMI 35-39.9    OSA (obstructive sleep apnea)    mild by study Dec '09. AHI 5.7   Plantar fasciitis    Varicella      Past Surgical History: Past Surgical History:  Procedure Laterality Date   CARPAL TUNNEL RELEASE     right   cataract     right eye with IOL   COLONOSCOPY WITH PROPOFOL N/A 04/26/2018   Procedure: COLONOSCOPY WITH PROPOFOL;  Surgeon: Sherrilyn Rist, MD;  Location: [facility] ENDOSCOPY;  Service: Gastroenterology;  Laterality: N/A;   DILATATION & CURETTAGE/HYSTEROSCOPY WITH MYOSURE N/A 02/09/2024   Procedure: DILATATION & CURETTAGE/HYSTEROSCOPY WITH MYOSURE WITH CERVICAL BIOPSY;  Surgeon: Carver Fila, MD;  Location: [facility] ORS;  Service: Gynecology;  Laterality: N/A;   DILATION AND CURETTAGE OF UTERUS     DILATION AND EVACUATION N/A 11/15/2023   Procedure: Cervical Dilation;  Surgeon: Osborn Coho, MD;  Location: [facility] OR;  Service: Gynecology;  Laterality: N/A;   G5P4     NSVD   OPERATIVE ULTRASOUND N/A 02/09/2024   Procedure: OPERATIVE  ULTRASOUND GUIDANCE;  Surgeon: Carver Fila, MD;  Location: [facility] ORS;  Service: Gynecology;  Laterality: N/A;    Allergies: No Known Allergies  Outpatient Meds: Current Facility-Administered Medications  Medication Dose Route Frequency Provider Last Rate Last Admin   0.9 %  sodium chloride infusion   Intravenous Continuous Danis, Starr Lake III, MD          ___________________________________________________________________ Objective   Exam:  BP (!) 160/64   Pulse 77   Temp 98.3 F (36.8 C) (Temporal)   Resp 19   Ht 4\' 9"  (1.448 m)   Wt 69.4 kg   SpO2 99%   BMI 33.11 kg/m   CV: regular , S1/S2 Resp: clear to auscultation bilaterally, normal RR and effort noted GI: soft, no tenderness, with active bowel sounds.   Assessment: Colo uterine fistula Colonic diverticulosis   Plan: Colonoscopy   The benefits and risks of the planned procedure(s) were described in detail with the patient or (when appropriate) their health care proxy.  Risks were outlined as including, but not limited to, bleeding, infection, perforation, adverse medication reaction leading to cardiac or pulmonary decompensation, pancreatitis (if ERCP).  The limitation of incomplete mucosal visualization was also discussed.  No guarantees or warranties were given.  The patient is appropriate for an endoscopic procedure in the ambulatory setting.   - Heather Jupiter, MD

## 2024-02-28 NOTE — Interval H&P Note (Signed)
 History and Physical Interval Note:  02/28/2024 10:07 AM  Heather Grant  has presented today for surgery, with the diagnosis of Colo uterine fistula.  The various methods of treatment have been discussed with the patient and family. After consideration of risks, benefits and other options for treatment, the patient has consented to  Procedure(s): COLONOSCOPY WITH PROPOFOL (N/A) as a surgical intervention.  The patient's history has been reviewed, patient examined, no change in status, stable for surgery.  I have reviewed the patient's chart and labs.  Questions were answered to the patient's satisfaction.     Charlie Pitter III

## 2024-02-28 NOTE — Anesthesia Preprocedure Evaluation (Signed)
 Anesthesia Evaluation  Patient identified by MRN, date of birth, ID band Patient awake    Reviewed: Allergy & Precautions, H&P , NPO status , Patient's Chart, lab work & pertinent test results  Airway Mallampati: II   Neck ROM: full    Dental   Pulmonary asthma , sleep apnea    breath sounds clear to auscultation       Cardiovascular hypertension,  Rhythm:regular Rate:Normal     Neuro/Psych  Headaches    GI/Hepatic ,GERD  ,,  Endo/Other    Renal/GU      Musculoskeletal  (+) Arthritis ,    Abdominal   Peds  Hematology   Anesthesia Other Findings   Reproductive/Obstetrics                             Anesthesia Physical Anesthesia Plan  ASA: 3  Anesthesia Plan: MAC   Post-op Pain Management:    Induction: Intravenous  PONV Risk Score and Plan: 2 and Propofol infusion and Treatment may vary due to age or medical condition  Airway Management Planned: Nasal Cannula  Additional Equipment:   Intra-op Plan:   Post-operative Plan:   Informed Consent: I have reviewed the patients History and Physical, chart, labs and discussed the procedure including the risks, benefits and alternatives for the proposed anesthesia with the patient or authorized representative who has indicated his/her understanding and acceptance.     Dental advisory given  Plan Discussed with: CRNA, Anesthesiologist and Surgeon  Anesthesia Plan Comments:        Anesthesia Quick Evaluation

## 2024-02-28 NOTE — Anesthesia Postprocedure Evaluation (Signed)
 Anesthesia Post Note  Patient: Heather Grant  Procedure(s) Performed: COLONOSCOPY WITH PROPOFOL POLYPECTOMY     Patient location during evaluation: PACU Anesthesia Type: MAC Level of consciousness: awake and alert Pain management: pain level controlled Vital Signs Assessment: post-procedure vital signs reviewed and stable Respiratory status: spontaneous breathing, nonlabored ventilation, respiratory function stable and patient connected to nasal cannula oxygen Cardiovascular status: stable and blood pressure returned to baseline Postop Assessment: no apparent nausea or vomiting Anesthetic complications: no   No notable events documented.  Last Vitals:  Vitals:   02/28/24 1110 02/28/24 1120  BP: (!) 167/69 (!) 152/59  Pulse: 66 62  Resp: 19 12  Temp:    SpO2: 100% 100%    Last Pain:  Vitals:   02/28/24 1120  TempSrc:   PainSc: 0-No pain                 [name]

## 2024-02-29 ENCOUNTER — Encounter (HOSPITAL_COMMUNITY): Payer: Self-pay | Admitting: Gastroenterology

## 2024-02-29 ENCOUNTER — Encounter: Payer: Self-pay | Admitting: Gastroenterology

## 2024-02-29 LAB — SURGICAL PATHOLOGY

## 2024-03-01 ENCOUNTER — Encounter: Payer: Self-pay | Admitting: Internal Medicine

## 2024-03-01 ENCOUNTER — Other Ambulatory Visit: Payer: Self-pay

## 2024-03-01 DIAGNOSIS — E785 Hyperlipidemia, unspecified: Secondary | ICD-10-CM

## 2024-03-01 MED ORDER — DULERA 100-5 MCG/ACT IN AERO: INHALATION_SPRAY | RESPIRATORY_TRACT | 3 refills | Status: AC

## 2024-03-01 MED ORDER — HYDRALAZINE HCL 25 MG PO TABS: 25.0000 mg | ORAL_TABLET | Freq: Three times a day (TID) | ORAL | 3 refills | Status: DC

## 2024-03-01 MED ORDER — ROSUVASTATIN CALCIUM 20 MG PO TABS: 20.0000 mg | ORAL_TABLET | Freq: Every day | ORAL | 2 refills | Status: AC

## 2024-03-02 DIAGNOSIS — R0602 Shortness of breath: Secondary | ICD-10-CM | POA: Diagnosis not present

## 2024-03-02 DIAGNOSIS — J45998 Other asthma: Secondary | ICD-10-CM | POA: Diagnosis not present

## 2024-03-07 ENCOUNTER — Other Ambulatory Visit: Payer: Self-pay

## 2024-03-07 ENCOUNTER — Ambulatory Visit: Payer: Self-pay

## 2024-03-07 NOTE — Patient Instructions (Addendum)
 Visit Information  Thank you for taking time to visit with me today. Please don't hesitate to contact me if I can be of assistance.   Patient has met all nursing care management goals. No Follow up call has been scheduled. Patient has been provided contact information should new needs arise.    Please call the Suicide and Crisis Lifeline: 988 call the [place] National Suicide Prevention Lifeline: (848)175-1297 or TTY: 438-558-8187 TTY 902-040-1937) to talk to a trained counselor if you are experiencing a Mental Health or Behavioral Health Crisis or need someone to talk to.  Kathyrn Sheriff, RN, MSN, BSN, CCM [place]  [facility], Population Health Case Manager Phone: [phone]

## 2024-03-07 NOTE — Patient Outreach (Signed)
 Complex Care Management   Visit Note  03/07/2024  Name:  Heather Grant MRN: [id number] DOB: June 16, 1941  Situation: Follow up-Referral received for Complex Care Management related to COPD I obtained verbal consent from patient.  Visit completed with patient  on the phone  Background:   Past Medical History:  Diagnosis Date   Anemia    Arthritis    hands and knees. May be hips   Gout    great toe, ankles   History of home oxygen therapy    prn   Hyperlipidemia    Hypertension    Migraines    Moderate persistent asthma    Nocturnal dyspnea    ONO 2010 with several readings  below 88%   Obesity, Class II, BMI 35-39.9    OSA (obstructive sleep apnea)    mild by study Dec '09. AHI 5.7   Plantar fasciitis    Varicella     Assessment: Patient reports she is doing well. She denies any signs/symptoms of COPD exacerbation. Dilation and Curetage completed on 02/09/24. Patient denies any bleeding or problems at this time. Patient also completed colonoscopy on 02/28/24. Patient with no questions. Denies no nursing care management needs at this time. Agreeable to case closure.   Patient Reported Symptoms: Cognitive Alert and oriented to person, place, and time, Normal speech and language skills  Neurological No symptoms reported    HEENT No symptoms reported    Cardiovascular No symptoms reported    Respiratory No symptoms reported    Endocrine No symptoms reported    Gastrointestinal No symptoms reported    Genitourinary No symptoms reported    Integumentary No symptoms reported    Musculoskeletal No symptoms reported    Psychosocial No symptoms reported     There were no vitals filed for this visit.  Medications Reviewed Today     Reviewed by Colletta Maryland, RN (Registered Nurse) on 03/07/24 at 1430  Med List Status: <None>   Medication Order Taking? Sig Documenting Provider Last Dose Status Informant  acetaminophen (TYLENOL) 325 MG tablet [id number] Yes Take 2  tablets (650 mg total) by mouth every 6 (six) hours as needed for mild pain (pain score 1-3) (or Fever >/= 101). Lorin Glass, MD Taking Active Self  albuterol (VENTOLIN HFA) 108 (90 Base) MCG/ACT inhaler [id number] Yes USE 1 TO 2 INHALATIONS BY MOUTH  EVERY 6 HOURS AS NEEDED FOR  WHEEZING OR SHORTNESS OF Elray Buba, MD Taking Active Self  colchicine 0.6 MG tablet [id number] Yes Take 0.6 mg by mouth daily. [provider] Taking Active Self  hydrALAZINE (APRESOLINE) 25 MG tablet [id number] Yes Take 1 tablet (25 mg total) by mouth 3 (three) times daily. Myrlene Broker, MD Taking Active   LUMIGAN 0.01 % SOLN [id number] Yes Place 1 drop into both eyes at bedtime. [provider] Taking Active Self  mometasone-formoterol (DULERA) 100-5 MCG/ACT Sandrea Matte [id number] Yes USE 2 INHALATIONS BY MOUTH TWICE DAILY Myrlene Broker, MD Taking Active   OXYGEN [id number] Yes Inhale 2 L into the lungs at bedtime as needed (shortness of breath). [provider] Taking Active Self           Med Note Jonnie Kind Dec 02, 2023 11:29 AM) 12/02/23: Reports during TOC call, has at her home-- reports uses as needed; caregiver- daughter reports she has not needed often "lately"  Polyvinyl Alcohol (LUBRICANT DROPS OP) [id number] Yes Place 1 drop into both eyes daily. [provider] Taking Active Self  rosuvastatin (CRESTOR) 20 MG tablet [id number] Yes Take 1 tablet (20 mg total) by mouth daily. Myrlene Broker, MD Taking Active   traMADol Janean Sark) 50 MG tablet [id number] Yes Take 50 mg by mouth every 6 (six) hours as needed for moderate pain (pain score 4-6). [provider] Taking Active Self  VOLTAREN 1 % GEL [id number] Yes Apply 2 g topically daily as needed (knee pain).  [provider] Taking Active Self           Med Note Pearson Forster, Leota Sauers   Fri Oct 12, 2014  3:34 PM)             Recommendation:   Ongoing follow up with  providers as scheduled/recommended  Follow Up Plan:   Closing From:  Nursing care management Patient has met all nursing care management goals. Nursing Care Management case will be closed. Patient has been provided contact information should new needs arise. HIPAA compliant message also left with daughter per message requesting to call if care management needs in the future.  Kathyrn Sheriff, RN, MSN, BSN, CCM [place]  [facility], Population Health Case Manager Phone: [phone]

## 2024-03-14 ENCOUNTER — Encounter: Payer: Self-pay | Admitting: Gynecologic Oncology

## 2024-03-24 ENCOUNTER — Telehealth: Payer: Self-pay | Admitting: *Deleted

## 2024-03-24 NOTE — Telephone Encounter (Signed)
 Spoke with patient's daughter Dorita Garter and relayed message from Dr. Orvil Bland that patient can follow back up with her gyn Dr. Caridad Chard. Dorita Garter states that they saw Dr. Camilo Cella this week and yes, agreed that she will call and make an appt. For her mother to follow up with Dr. Caridad Chard. [name] thanked the office for calling.

## 2024-03-28 ENCOUNTER — Encounter: Payer: Self-pay | Admitting: Licensed Clinical Social Worker

## 2024-04-01 DIAGNOSIS — R0602 Shortness of breath: Secondary | ICD-10-CM | POA: Diagnosis not present

## 2024-04-01 DIAGNOSIS — J45998 Other asthma: Secondary | ICD-10-CM | POA: Diagnosis not present

## 2024-04-03 ENCOUNTER — Telehealth: Payer: Self-pay | Admitting: Licensed Clinical Social Worker

## 2024-04-03 NOTE — Telephone Encounter (Signed)
 Copied from CRM [id number]. Topic: General - Call Back - No Documentation >> Apr 03, 2024 11:21 AM Zipporah Him wrote: Reason for CRM: Calling to speak with Jasmine. CAL refused pt call. Please call back when available.

## 2024-04-12 ENCOUNTER — Other Ambulatory Visit: Payer: Self-pay | Admitting: Licensed Clinical Social Worker

## 2024-04-18 NOTE — Patient Instructions (Signed)
 Visit Information  Thank you for taking time to visit with me today. Please don't hesitate to contact me if I can be of assistance to you before our next scheduled appointment.  Our next appointment is by telephone on 6/6 at 11:30 AM Please call the care guide team at [phone] if you need to cancel or reschedule your appointment.   Following is a copy of your care plan:   Goals Addressed             This Visit's Progress    LCSW VBCI Social Work Care Plan   On track    Problems:   Disease Management support and education needs related to Stress related to Health Conditions  CSW Clinical Goal(s):   Over the next 90 days the Patient will attend all scheduled medical appointments as evidenced by patient report and care team review of appointment completion in electronic MEDICAL RECORD NUMBER  demonstrate a reduction in symptoms related to Stress at related to Health Conditions .  Interventions:  Mental Health:  Evaluation of current treatment plan related to Stress due to health conditions Active listening / Reflection utilized Emotional Support Provided Mindfulness or Relaxation training provided Solution-Focued Strategies employed:  Patient Goals/Self-Care Activities:  Continue taking your medication as prescribed.   Increase coping skills and healthy habits  Plan:   Telephone follow up appointment with care management team member scheduled for:  2-4 weeks     COMPLETED: Obtain Supportive Resources       Activities and task to complete in order to accomplish goals.   Keep all upcoming appointments discussed today Continue with compliance of taking medication prescribed by Doctor Implement healthy coping skills discussed to assist with management of symptoms           Please call the Suicide and Crisis Lifeline: 988 go to [facility] [address] [place] [phone]) call 911 if you are experiencing a Mental Health or  Behavioral Health Crisis or need someone to talk to.  Patient verbalizes understanding of instructions and care plan provided today and agrees to view in MyChart. Active MyChart status and patient understanding of how to access instructions and care plan via MyChart confirmed with patient.     Arlis Bent [facility]  [facility], [facility] Clinical Social Worker Direct Dial: [phone]  Fax: [phone] Website: [email or web address] 5:04 AM

## 2024-04-18 NOTE — Patient Outreach (Signed)
 Complex Care Management   Visit Note  04/12/2024  Name:  Heather Grant  MRN: [id number] DOB: 03-Mar-1941  Situation: Referral received for Complex Care Management related to Stress at managing health conditions I obtained verbal consent from Patient.  Visit completed with pt  on the phone  Background:   Past Medical History:  Diagnosis Date   Anemia    Arthritis    hands and knees. May be hips   Gout    great toe, ankles   History of home oxygen  therapy    prn   Hyperlipidemia    Hypertension    Migraines    Moderate persistent asthma    Nocturnal dyspnea    ONO 2010 with several readings  below 88%   Obesity, Class II, BMI 35-39.9    OSA (obstructive sleep apnea)    mild by study Dec '09. AHI 5.7   Plantar fasciitis    Varicella     Assessment: Patient Reported Symptoms:  Cognitive Cognitive Status: Alert and oriented to person, place, and time, Normal speech and language skills Cognitive/Intellectual Conditions Management [RPT]: None reported or documented in medical history or problem list      Neurological Neurological Review of Symptoms: No symptoms reported    HEENT HEENT Symptoms Reported: No symptoms reported      Cardiovascular Cardiovascular Symptoms Reported: No symptoms reported Does patient have uncontrolled Hypertension?: No Cardiovascular Conditions: Hypertension  Respiratory Respiratory Symptoms Reported: No symptoms reported    Endocrine Patient reports the following symptoms related to hypoglycemia or hyperglycemia : No symptoms reported Is patient diabetic?: No    Gastrointestinal Gastrointestinal Symptoms Reported: No symptoms reported      Genitourinary Genitourinary Symptoms Reported: No symptoms reported    Integumentary Integumentary Symptoms Reported: No symptoms reported    Musculoskeletal Musculoskelatal Symptoms Reviewed: Unsteady gait Musculoskeletal Conditions: Unsteady gait Musculoskeletal Management Strategies: Coping  strategies, Routine screening      Psychosocial Psychosocial Symptoms Reported: No symptoms reported Behavioral Management Strategies: Coping strategies, Support system Major Change/Loss/Stressor/Fears (CP): Medical condition, self Quality of Family Relationships: helpful, involved, supportive Do you feel physically threatened by others?: No      01/28/2024   10:12 AM  Depression screen PHQ 2/9  Decreased Interest 0  Down, Depressed, Hopeless 0  PHQ - 2 Score 0  Altered sleeping 0  Tired, decreased energy 0  Change in appetite 0  Feeling bad or failure about yourself  0  Trouble concentrating 0  Moving slowly or fidgety/restless 0  Suicidal thoughts 0  PHQ-9 Score 0    There were no vitals filed for this visit.  Medications Reviewed Today     Reviewed by Adriana Albany, LCSW (Social Worker) on 04/12/24 at 1224  Med List Status: <None>   Medication Order Taking? Sig Documenting Provider Last Dose Status Informant  acetaminophen  (TYLENOL ) 325 MG tablet [id number] Yes Take 2 tablets (650 mg total) by mouth every 6 (six) hours as needed for mild pain (pain score 1-3) (or Fever >/= 101). Hoyt Macleod, MD Taking Active Self  albuterol  (VENTOLIN  HFA) 108 (90 Base) MCG/ACT inhaler [id number] Yes USE 1 TO 2 INHALATIONS BY MOUTH  EVERY 6 HOURS AS NEEDED FOR  WHEEZING OR SHORTNESS OF Montgomery Apgar, MD Taking Active Self  colchicine  0.6 MG tablet [id number] Yes Take 0.6 mg by mouth daily. [provider] Taking Active Self  hydrALAZINE  (APRESOLINE ) 25 MG tablet [id number] Yes Take 1 tablet (25 mg total) by mouth 3 (three) times daily.  Adelia Homestead, MD Taking Active   LUMIGAN 0.01 % SOLN [id number] Yes Place 1 drop into both eyes at bedtime. [provider] Taking Active Self  mometasone -formoterol  (DULERA ) 100-5 MCG/ACT AERO [id number] Yes USE 2 INHALATIONS BY MOUTH TWICE DAILY Adelia Homestead, MD Taking Active   OXYGEN  [id number] Yes Inhale 2 L  into the lungs at bedtime as needed (shortness of breath). [provider] Taking Active Self           Med Note (TOUSEY, LAINE M   Thu Dec 02, 2023 11:29 AM) 12/02/23: Reports during TOC call, has at her home-- reports uses as needed; caregiver- daughter reports she has not needed often "lately"  Polyvinyl Alcohol (LUBRICANT DROPS OP) [id number] Yes Place 1 drop into both eyes daily. [provider] Taking Active Self  rosuvastatin  (CRESTOR ) 20 MG tablet [id number] Yes Take 1 tablet (20 mg total) by mouth daily. Adelia Homestead, MD Taking Active   traMADol  (ULTRAM ) 50 MG tablet [id number] No Take 50 mg by mouth every 6 (six) hours as needed for moderate pain (pain score 4-6).  Patient not taking: Reported on 04/12/2024   [provider] Not Taking Active Self  VOLTAREN 1 % GEL [id number] Yes Apply 2 g topically daily as needed (knee pain).  [provider] Taking Active Self           Med Note Junnie Olives, Malissa Se   Fri Oct 12, 2014  3:34 PM)              Recommendation:   Continue utilizing strategies discussed to assist with symptom management  Follow Up Plan:   Telephone follow-up in 1 month  Alease Hunter, LCSW [facility]  [facility], [facility] Clinical Social Worker Direct Dial: [phone]  Fax: [phone] Website: [email or web address] 5:04 AM

## 2024-04-20 DIAGNOSIS — M25561 Pain in right knee: Secondary | ICD-10-CM | POA: Diagnosis not present

## 2024-04-20 DIAGNOSIS — M25562 Pain in left knee: Secondary | ICD-10-CM | POA: Diagnosis not present

## 2024-05-02 DIAGNOSIS — R0602 Shortness of breath: Secondary | ICD-10-CM | POA: Diagnosis not present

## 2024-05-02 DIAGNOSIS — J45998 Other asthma: Secondary | ICD-10-CM | POA: Diagnosis not present

## 2024-05-05 ENCOUNTER — Other Ambulatory Visit: Payer: Self-pay | Admitting: Licensed Clinical Social Worker

## 2024-05-15 NOTE — Patient Outreach (Signed)
 Complex Care Management   Visit Note  05/05/2024  Name:  Heather Grant  MRN: [id number] DOB: 1941-09-29  Situation: Referral received for Complex Care Management related to SDOH Barriers:  Housing Senior I obtained verbal consent from Patient.  Visit completed with pt  on the phone  Background:   Past Medical History:  Diagnosis Date   Anemia    Arthritis    hands and knees. May be hips   Gout    great toe, ankles   History of home oxygen  therapy    prn   Hyperlipidemia    Hypertension    Migraines    Moderate persistent asthma    Nocturnal dyspnea    ONO 2010 with several readings  below 88%   Obesity, Class II, BMI 35-39.9    OSA (obstructive sleep apnea)    mild by study Dec '09. AHI 5.7   Plantar fasciitis    Varicella     Assessment: Patient Reported Symptoms:  Cognitive Cognitive Status: Alert and oriented to person, place, and time, Normal speech and language skills Cognitive/Intellectual Conditions Management [RPT]: None reported or documented in medical history or problem list      Neurological Neurological Review of Symptoms: No symptoms reported    HEENT HEENT Symptoms Reported: No symptoms reported      Cardiovascular Cardiovascular Symptoms Reported: No symptoms reported    Respiratory Respiratory Symptoms Reported: No symptoms reported    Endocrine Patient reports the following symptoms related to hypoglycemia or hyperglycemia : No symptoms reported Is patient diabetic?: No    Gastrointestinal Gastrointestinal Symptoms Reported: No symptoms reported      Genitourinary Genitourinary Symptoms Reported: No symptoms reported    Integumentary Integumentary Symptoms Reported: No symptoms reported    Musculoskeletal Musculoskelatal Symptoms Reviewed: Unsteady gait Musculoskeletal Conditions: Unsteady gait Musculoskeletal Management Strategies: Routine screening      Psychosocial Psychosocial Symptoms Reported: No symptoms reported Additional  Psychological Details: Patient reports that she is unhappy with housing community. Resources discussed and will be mailed to residence Behavioral Management Strategies: Support system, Community resources Major Change/Loss/Stressor/Fears (CP): Environment Quality of Family Relationships: helpful, involved, supportive Do you feel physically threatened by others?: No      01/28/2024   10:12 AM  Depression screen PHQ 2/9  Decreased Interest 0  Down, Depressed, Hopeless 0  PHQ - 2 Score 0  Altered sleeping 0  Tired, decreased energy 0  Change in appetite 0  Feeling bad or failure about yourself  0  Trouble concentrating 0  Moving slowly or fidgety/restless 0  Suicidal thoughts 0  PHQ-9 Score 0    There were no vitals filed for this visit.  Medications Reviewed Today   Medications were not reviewed in this encounter     Recommendation:   Continue Current Plan of Care  Follow Up Plan:   Telephone follow-up in 1 month  Alease Hunter, LCSW [facility]  [facility], [facility] Clinical Social Worker Direct Dial: [phone]  Fax: [phone] Website: [email or web address] 9:24 AM

## 2024-05-15 NOTE — Patient Instructions (Signed)
 Visit Information  Thank you for taking time to visit with me today. Please don't hesitate to contact me if I can be of assistance to you before our next scheduled appointment.  Your next care management appointment is by telephone on 07/11 at 11:30 AM  Please call the care guide team at [phone] if you need to cancel, schedule, or reschedule an appointment.   Please call the Suicide and Crisis Lifeline: 988 go to [facility] Urgent [facility] [address], [place] [phone]) call 911 if you are experiencing a Mental Health or Behavioral Health Crisis or need someone to talk to.  Alease Hunter, LCSW [place]  [facility], [facility] Clinical Social Worker Direct Dial: [phone]  Fax: [phone] Website: [email or web address] 9:25 AM

## 2024-05-29 ENCOUNTER — Encounter: Payer: Self-pay | Admitting: Internal Medicine

## 2024-05-29 DIAGNOSIS — J4540 Moderate persistent asthma, uncomplicated: Secondary | ICD-10-CM

## 2024-05-29 DIAGNOSIS — R200 Anesthesia of skin: Secondary | ICD-10-CM

## 2024-05-29 DIAGNOSIS — M150 Primary generalized (osteo)arthritis: Secondary | ICD-10-CM

## 2024-05-30 NOTE — Telephone Encounter (Signed)
 Patient and family is wanting to move forward with a powered wheelchair

## 2024-05-30 NOTE — Addendum Note (Signed)
 Addended by: ROLLENE NORRIS A on: 05/30/2024 04:21 PM   Modules accepted: Orders

## 2024-06-01 DIAGNOSIS — J45998 Other asthma: Secondary | ICD-10-CM | POA: Diagnosis not present

## 2024-06-01 DIAGNOSIS — R0602 Shortness of breath: Secondary | ICD-10-CM | POA: Diagnosis not present

## 2024-06-06 ENCOUNTER — Ambulatory Visit (INDEPENDENT_AMBULATORY_CARE_PROVIDER_SITE_OTHER): Admitting: Podiatry

## 2024-06-06 ENCOUNTER — Encounter: Payer: Self-pay | Admitting: Podiatry

## 2024-06-06 DIAGNOSIS — M79676 Pain in unspecified toe(s): Secondary | ICD-10-CM | POA: Diagnosis not present

## 2024-06-06 DIAGNOSIS — B351 Tinea unguium: Secondary | ICD-10-CM | POA: Diagnosis not present

## 2024-06-06 NOTE — Progress Notes (Signed)
  Subjective:  Patient ID: Heather Grant , female    DOB: [birth year],  MRN: [id number]  83 y.o. female presents painful porokeratotic lesion(s) left foot and painful mycotic toenails that limit ambulation. Painful toenails interfere with ambulation. Aggravating factors include wearing enclosed shoe gear. Pain is relieved with periodic professional debridement. Painful porokeratotic lesions are aggravated when weightbearing with and without shoegear. Pain is relieved with periodic professional debridement.  Chief Complaint  Patient presents with   RFC    RFC Non diabetic toenail trim.    New problem(s): None   PCP is Rollene Almarie LABOR, MD , and last visit was February 14, 2024.  No Known Allergies  Review of Systems: Negative except as noted in the HPI.   Objective:  Precious A Cangemi  is a pleasant 83 y.o. female WD, WN in NAD. AAO x 3.  Vascular Examination: Vascular status intact b/l with palpable pedal pulses. CFT immediate b/l. Pedal hair present. No edema. No pain with calf compression b/l. Skin temperature gradient WNL b/l. No varicosities noted. No cyanosis or clubbing noted.  Neurological Examination: Sensation grossly intact b/l with 10 gram monofilament. Vibratory sensation intact b/l.  Dermatological Examination: Pedal skin with normal turgor, texture and tone b/l. No open wounds nor interdigital macerations noted. Toenails 1-5 b/l thick, discolored, elongated with subungual debris and pain on dorsal palpation. No hyperkeratotic lesions noted b/l. Resolved lesiolns distal tip left 3rd digit and plantar sub 5th met base left foot.  Musculoskeletal Examination: Muscle strength 5/5 to b/l LE.  No pain, crepitus noted b/l. HAV with bunion deformity noted b/l LE. Hammertoe(s) 2-5 b/l.  Radiographs: None  Assessment:   1. Pain due to onychomycosis of toenail    Plan:  Patient was evaluated and treated. All patient's and/or POA's questions/concerns addressed on  today's visit. Toenails 1-5 debrided in length and girth without incident. Continue soft, supportive shoe gear daily. Report any pedal injuries to medical professional. Call office if there are any questions/concerns. -Patient/POA to call should there be question/concern in the interim.  Return in about 3 months (around 09/06/2024).  Delon LITTIE Merlin, DPM      [place] LOCATION: [address] [place] [address]                   Office [phone]   [facility] LOCATION: [address] [place], [place] [address] Office [phone]

## 2024-06-09 ENCOUNTER — Other Ambulatory Visit: Payer: Self-pay | Admitting: Licensed Clinical Social Worker

## 2024-06-15 NOTE — Patient Instructions (Signed)
 Visit Information  Thank you for taking time to visit with me today. Please don't hesitate to contact me if I can be of assistance to you before our next scheduled appointment.  Your next care management appointment is by telephone on 08/01 at 11:30 AM  Please call the care guide team at [phone] if you need to cancel, schedule, or reschedule an appointment.   Please call the Suicide and Crisis Lifeline: 988 go to [facility] Urgent [facility] [address] [place] [phone]) call 911 if you are experiencing a Mental Health or Behavioral Health Crisis or need someone to talk to.  Rolin Kerns, LCSW [place]  [facility], [facility] Clinical Social Worker Direct Dial: [phone]  Fax: [phone] Website: [email or web address] 9:44 AM

## 2024-06-21 ENCOUNTER — Ambulatory Visit: Admitting: Physical Therapy

## 2024-06-30 ENCOUNTER — Other Ambulatory Visit: Payer: Self-pay | Admitting: Licensed Clinical Social Worker

## 2024-06-30 NOTE — Patient Instructions (Signed)
 Visit Information  Thank you for taking time to visit with me today. Please don't hesitate to contact me if I can be of assistance to you before our next scheduled appointment.  Your next care management appointment is by telephone on 09/5 at 11:30 AM  Please call the care guide team at [phone] if you need to cancel, schedule, or reschedule an appointment.   Please call the Suicide and Crisis Lifeline: 988 go to [facility] Urgent [facility] [address], [place] [phone]) call 911 if you are experiencing a Mental Health or Behavioral Health Crisis or need someone to talk to.  Rolin Kerns, LCSW [place]  [facility], [facility] Clinical Social Worker Direct Dial: [phone]  Fax: [phone] Website: [email or web address] 4:20 PM

## 2024-06-30 NOTE — Patient Outreach (Signed)
 Complex Care Management   Visit Note  06/30/2024  Name:  Heather Grant  MRN: [id number] DOB: 1941/08/02  Situation: Referral received for Complex Care Management related to Housing I obtained verbal consent from Patient.  Visit completed with pt  on the phone  Background:   Past Medical History:  Diagnosis Date   Anemia    Arthritis    hands and knees. May be hips   Gout    great toe, ankles   History of home oxygen  therapy    prn   Hyperlipidemia    Hypertension    Migraines    Moderate persistent asthma    Nocturnal dyspnea    ONO 2010 with several readings  below 88%   Obesity, Class II, BMI 35-39.9    OSA (obstructive sleep apnea)    mild by study Dec '09. AHI 5.7   Plantar fasciitis    Varicella     Assessment: Patient Reported Symptoms:  Cognitive Cognitive Status: Alert and oriented to person, place, and time, Normal speech and language skills Cognitive/Intellectual Conditions Management [RPT]: None reported or documented in medical history or problem list      Neurological Neurological Review of Symptoms: No symptoms reported Neurological Comment: Reminded pt of upcoming Neuro Rehab Eval appt  HEENT HEENT Symptoms Reported: Not assessed      Cardiovascular Cardiovascular Symptoms Reported: Not assessed    Respiratory Respiratory Symptoms Reported: Not assesed    Endocrine Endocrine Symptoms Reported: Not assessed    Gastrointestinal Gastrointestinal Symptoms Reported: Not assessed      Genitourinary Genitourinary Symptoms Reported: Not assessed    Integumentary Integumentary Symptoms Reported: Not assessed    Musculoskeletal Musculoskelatal Symptoms Reviewed: Not assessed        Psychosocial Psychosocial Symptoms Reported: No symptoms reported            01/28/2024   10:12 AM  Depression screen PHQ 2/9  Decreased Interest 0  Down, Depressed, Hopeless 0  PHQ - 2 Score 0  Altered sleeping 0  Tired, decreased energy 0  Change in  appetite 0  Feeling bad or failure about yourself  0  Trouble concentrating 0  Moving slowly or fidgety/restless 0  Suicidal thoughts 0  PHQ-9 Score 0    There were no vitals filed for this visit.  Medications Reviewed Today     Reviewed by Dent Plantz D, LCSW (Social Worker) on 06/30/24 at 1142  Med List Status: <None>   Medication Order Taking? Sig Documenting Provider Last Dose Status Informant  acetaminophen  (TYLENOL ) 325 MG tablet [id number]  Take 2 tablets (650 mg total) by mouth every 6 (six) hours as needed for mild pain (pain score 1-3) (or Fever >/= 101). Arlice Reichert, MD  Active Self  albuterol  (VENTOLIN  HFA) 108 (90 Base) MCG/ACT inhaler [id number]  USE 1 TO 2 INHALATIONS BY MOUTH  EVERY 6 HOURS AS NEEDED FOR  WHEEZING OR SHORTNESS OF SHERIDA Rollene Almarie DELENA, MD  Active Self  colchicine  0.6 MG tablet [id number]  Take 0.6 mg by mouth daily. [provider]  Active Self  hydrALAZINE  (APRESOLINE ) 25 MG tablet [id number]  Take 1 tablet (25 mg total) by mouth 3 (three) times daily. Rollene Almarie DELENA, MD  Active   LUMIGAN 0.01 % SOLN [id number]  Place 1 drop into both eyes at bedtime. [provider]  Active Self  mometasone -formoterol  (DULERA ) 100-5 MCG/ACT AERO [id number]  USE 2 INHALATIONS BY MOUTH TWICE DAILY Rollene Almarie DELENA, MD  Active   OXYGEN  [id number]  Inhale 2 L into the lungs at bedtime as needed (shortness of breath). [provider]  Active Self           Med Note (TOUSEY, LAINE M   Thu Dec 02, 2023 11:29 AM) 12/02/23: Reports during TOC call, has at her home-- reports uses as needed; caregiver- daughter reports she has not needed often lately  Polyvinyl Alcohol (LUBRICANT DROPS OP) [id number]  Place 1 drop into both eyes daily. [provider]  Active Self  rosuvastatin  (CRESTOR ) 20 MG tablet [id number]  Take 1 tablet (20 mg total) by mouth daily. Rollene Almarie LABOR, MD  Active   traMADol  (ULTRAM ) 50 MG tablet [id number]   Take 50 mg by mouth every 6 (six) hours as needed for moderate pain (pain score 4-6). [provider]  Active Self  VOLTAREN 1 % GEL [id number]  Apply 2 g topically daily as needed (knee pain).  [provider]  Active Self           Med Note EPIFANIO, SHARENE BIRCH   Fri Oct 12, 2014  3:34 PM)              Recommendation:   Continue Current Plan of Care  Follow Up Plan:   Telephone follow-up in 1 month  Rolin Kerns, LCSW [facility]  [facility], [facility] Clinical Social Worker Direct Dial: [phone]  Fax: [phone] Website: [email or web address] 4:20 PM

## 2024-07-02 DIAGNOSIS — R0602 Shortness of breath: Secondary | ICD-10-CM | POA: Diagnosis not present

## 2024-07-02 DIAGNOSIS — J45998 Other asthma: Secondary | ICD-10-CM | POA: Diagnosis not present

## 2024-07-04 ENCOUNTER — Other Ambulatory Visit: Payer: Self-pay

## 2024-07-04 ENCOUNTER — Ambulatory Visit: Attending: Internal Medicine

## 2024-07-04 DIAGNOSIS — M6281 Muscle weakness (generalized): Secondary | ICD-10-CM | POA: Diagnosis not present

## 2024-07-04 DIAGNOSIS — R202 Paresthesia of skin: Secondary | ICD-10-CM | POA: Insufficient documentation

## 2024-07-04 DIAGNOSIS — R2689 Other abnormalities of gait and mobility: Secondary | ICD-10-CM | POA: Diagnosis not present

## 2024-07-04 DIAGNOSIS — H43813 Vitreous degeneration, bilateral: Secondary | ICD-10-CM | POA: Diagnosis not present

## 2024-07-04 DIAGNOSIS — M150 Primary generalized (osteo)arthritis: Secondary | ICD-10-CM | POA: Diagnosis not present

## 2024-07-04 DIAGNOSIS — J4540 Moderate persistent asthma, uncomplicated: Secondary | ICD-10-CM | POA: Insufficient documentation

## 2024-07-04 DIAGNOSIS — Z961 Presence of intraocular lens: Secondary | ICD-10-CM | POA: Diagnosis not present

## 2024-07-04 DIAGNOSIS — R200 Anesthesia of skin: Secondary | ICD-10-CM | POA: Insufficient documentation

## 2024-07-04 DIAGNOSIS — H401122 Primary open-angle glaucoma, left eye, moderate stage: Secondary | ICD-10-CM | POA: Diagnosis not present

## 2024-07-04 NOTE — Therapy (Signed)
 OUTPATIENT PHYSICAL THERAPY WHEELCHAIR EVALUATION   Patient Name: Heather Grant  MRN: [id number] DOB:1941-01-14, 83 y.o., female Today's Date: 07/04/2024  END OF SESSION:  PT End of Session - 07/04/24 1045     Visit Number 1    Number of Visits 1    PT Start Time 1045    PT Stop Time 1145    PT Time Calculation (min) 60 min    Equipment Utilized During Treatment Gait belt    Activity Tolerance Patient tolerated treatment well    Behavior During Therapy WFL for tasks assessed/performed          Past Medical History:  Diagnosis Date   Anemia    Arthritis    hands and knees. May be hips   Gout    great toe, ankles   History of home oxygen  therapy    prn   Hyperlipidemia    Hypertension    Migraines    Moderate persistent asthma    Nocturnal dyspnea    ONO 2010 with several readings  below 88%   Obesity, Class II, BMI 35-39.9    OSA (obstructive sleep apnea)    mild by study Dec '09. AHI 5.7   Plantar fasciitis    Varicella    Past Surgical History:  Procedure Laterality Date   CARPAL TUNNEL RELEASE     right   cataract     right eye with IOL   COLONOSCOPY WITH PROPOFOL  N/A 04/26/2018   Procedure: COLONOSCOPY WITH PROPOFOL ;  Surgeon: Legrand Victory LITTIE DOUGLAS, MD;  Location: [facility];  Service: Gastroenterology;  Laterality: N/A;   COLONOSCOPY WITH PROPOFOL  N/A 02/28/2024   Procedure: COLONOSCOPY WITH PROPOFOL ;  Surgeon: Legrand Victory LITTIE DOUGLAS, MD;  Location: [facility];  Service: Gastroenterology;  Laterality: N/A;   DILATATION & CURETTAGE/HYSTEROSCOPY WITH MYOSURE N/A 02/09/2024   Procedure: DILATATION & CURETTAGE/HYSTEROSCOPY WITH MYOSURE WITH CERVICAL BIOPSY;  Surgeon: Viktoria Comer SAUNDERS, MD;  Location: [facility];  Service: Gynecology;  Laterality: N/A;   DILATION AND CURETTAGE OF UTERUS     DILATION AND EVACUATION N/A 11/15/2023   Procedure: Cervical Dilation;  Surgeon: Henry Slough, MD;  Location: [facility];  Service: Gynecology;  Laterality: N/A;   G5P4      NSVD   OPERATIVE ULTRASOUND N/A 02/09/2024   Procedure: OPERATIVE ULTRASOUND GUIDANCE;  Surgeon: Viktoria Comer SAUNDERS, MD;  Location: [facility];  Service: Gynecology;  Laterality: N/A;   POLYPECTOMY  02/28/2024   Procedure: POLYPECTOMY;  Surgeon: Legrand Victory LITTIE DOUGLAS, MD;  Location: [facility];  Service: Gastroenterology;;   Patient Active Problem List   Diagnosis Date Noted   Benign neoplasm of transverse colon 02/28/2024   Thickened endometrium 02/09/2024   Colouterine fistula 11/25/2023   Moderate persistent asthma 11/13/2023   Numbness and tingling of both feet 10/16/2022   Cyst, dermoid, scalp and neck 02/06/2022   Headache 08/31/2019   Osteopenia 08/31/2019   Chronic venous insufficiency 12/04/2016   GERD (gastroesophageal reflux disease) 06/19/2016   Chronic rhinitis 03/15/2014   Routine general medical examination at a health care facility 07/08/2011   Osteoarthritis 07/08/2011   Essential hypertension    OSA (obstructive sleep apnea)    Gout    Hyperlipidemia     PCP: Dr. Almarie Cleveland  REFERRING PROVIDER: Cleveland Almarie DELENA, MD  THERAPY DIAG:  Other abnormalities of gait and mobility  Muscle weakness (generalized)  Rationale for Evaluation and Treatment Rehabilitation  SUBJECTIVE:  SUBJECTIVE STATEMENT: Pt presents for wheelchair evaluation. Painful toenails interfere with ambulation. Patient has difficulty with ambulation with or without shoes. Pain is relieved some with periodic professional debridement by podiatry Painful porokeratotic lesions are aggravated when weightbearing. Pt has hx of gout issues in bil lower LE, HTN, O2 dependant 2 L at home with concentrator, asthma. Pt lives by herself at home. Pt has fmaily that checks on her daily. Pt reports that she takes showers  2x/day with shower bench and with help of her family. Family helps with cooking, cleaning and home management. Pt reports of L shoulder pain and can't reach overhead.    PRECAUTIONS: Fall  RED FLAGS: None  WEIGHT BEARING RESTRICTIONS No    OCCUPATION: retired  PLOF:  Needs assistance with ADLs, Needs assistance with homemaking, Needs assistance with gait, and Needs assistance with transfers  PATIENT GOALS: get a power chair         MEDICAL HISTORY:  Primary diagnosis onset: 05/30/2024     Medical Diagnosis with ICD-10 code: J45.40 (ICD-10-CM) - Moderate persistent asthma without complication  R20.0,R20.2 (ICD-10-CM) - Numbness and tingling of both feet  M15.0 (ICD-10-CM) - Primary osteoarthritis involving multiple joints   [] Progressive disease  Relevant future surgeries:     Height: 4' 9 Weight: 153 lbs Explain recent changes or trends in weight:      History:  Past Medical History:  Diagnosis Date   Anemia    Arthritis    hands and knees. May be hips   Gout    great toe, ankles   History of home oxygen  therapy    prn   Hyperlipidemia    Hypertension    Migraines    Moderate persistent asthma    Nocturnal dyspnea    ONO 2010 with several readings  below 88%   Obesity, Class II, BMI 35-39.9    OSA (obstructive sleep apnea)    mild by study Dec '09. AHI 5.7   Plantar fasciitis    Varicella        Cardio Status:  Functional Limitations:   [x] Intact  []  Impaired      Respiratory Status:  Functional Limitations:   [] Intact  [] Impaired   [x] SOB [] COPD [x] O2 Dependent __2____LPM  [] Ventilator Dependent  Resp equip:                                                     Objective Measure(s):   Orthotics:   [] Amputee:                                                             [] Prosthesis:        HOME ENVIRONMENT:  [] House [] Condo/town home [x] Apartment [] Asst living [] LTCF         [] Own  [x] Rent   [x] Lives alone [] Lives with others -                              Hours without assistance: 24  [x] Home is accessible to patient  Storage of wheelchair:  [x] In home   [] Other Comments:        COMMUNITY :  TRANSPORTATION:  [] Car [] Midwife [] Adapted w/c Lift []  Ambulance [] Other:                     [] Sits in wheelchair during transport   Where is w/c stored during transport?  [] [place] [place]  []  [employer]  r   [] Self-Driver       Drive while in  Biomedical scientist [] yes [x] no   Employment and/or school:  Specific requirements pertaining to mobility        Other:  COMMUNICATION:  Verbal Communication  [x] WFL [] receptive [] WFL [] expressive [] Understandable  [] Difficult to understand  [] non-communicative  Primary Language:___English___________ 2nd:_____________  Communication provided by:[x] Patient [x] Family [] Caregiver [] Translator   [] Uses an Paramedic device     Manufacturer/Model :                                                                MOBILITY/BALANCE:  Sitting Balance  Standing Balance  Transfers  Ambulation   [x] WFL      [] WFL  [] Independent  []  Independent   [] Uses UE for balance in sitting Comments:  [] Uses UE/device for stability Comments:  [x]  Min assist  []  Ambulates independently with       device:___________________      []  Mod assist  [x]  Able to ambulate __10____ feet        safely/functionally/independently   []  Min assist  [x]  Min assist  []  Max assist  []  Non-functional ambulator         History/High risk of falls   []  Mod assist  []  Mod assist  []  Dependent  []  Unable to ambulate   []  Max  assist  []  Max assist  Transfer method:[] 1 person [] 2 person [] sliding board [] squat pivot [x] stand pivot [] mechanical patient lift  [] other:   []  Unable  []  Unable    Fall History: # of falls in the past 6 months? 0 # of "near" falls in the past 6 months? Intermittently, pt reports of mostly sedintary ADLs, gets up 1 or 2x day with family to do little walking     CURRENT  SEATING / MOBILITY:  Current Mobility Device: [] None [x] Cane/Walker [] Manual [] Dependent [] Dependent w/ Tilt rScooter  [] Power (type of control):   Manufacturer:  Model:  Serial #:   Size:  Color:  Age:   Purchased by whom:   Current condition of mobility base:    Current seating system:                                                                       Age of seating system:    Describe posture in present seating system:    Is the current mobility meeting medical necessity?:  [] Yes [x] No Describe: Pt has difficulty with performing standing/walking and has to rely on family for some cooking, cleaning, ambulation.  Ability to complete Mobility-Related Activities of Daily Living (MRADL's) with Current Mobility Device:   Move room to room  [] Independent  [x] Min [] Mod [] Max assist  [] Unable  Comments: uses RW, shower bench, family assist with cooking, cleaning, showers, LE dressing on occaison  Meal prep  [] Independent  [] Min [] Mod [] Max assist  [x] Unable    Feeding  [x] Independent  [] Min [] Mod [] Max assist  [] Unable    Bathing  [] Independent  [] Min [x] Mod [] Max assist  [] Unable    Grooming  [x] Independent  [] Min [] Mod [] Max assist  [] Unable    UE dressing  [] Independent  [x] Min [] Mod [] Max assist  [] Unable    LE dressing  [] Independent   [x] Min [] Mod [] Max assist  [] Unable    Toileting  [x] Independent  [] Min [] Mod [] Max assist  [] Unable    Bowel Mgt: [x]  Continent []  Incontinent []  Accidents []  Diapers []  Colostomy []  Bowel Program:  Bladder Mgt: [x]  Continent []  Incontinent []  Accidents []  Diapers []  Urinal []  Intermittent Cath []  Indwelling Cath []  Supra-pubic Cath     Current Mobility Equipment Trialed/ Ruled Out:    Does not meet mobility needs due to:    Mark all boxes that indicate inability to use the specific equipment listed     Meets needs for safe  independent functional  ambulation  / mobility    Risk of  Falling or History of Falls     Enviromental limitations      Cognition    Safety concerns with  physical ability    Decreased / limitations endurance  & strength     Decreased / limitations  motor skills  & coordination    Pain    Pace /  Speed    Cardiac and/or  respiratory condition    Contra - indicated by diagnosis   Cane/Crutches  []   [x]   []   []   [x]   [x]   [x]   []   [x]   []   []    Walker / Rollator  []  NA   []   [x]   []   []   [x]   [x]   [x]   []   [x]   []   []     Manual Wheelchair [id number]:  [x]  NA  []   []   []   []   []   []   []   []   []   []   []    Manual W/C (K0005) with power assist  [x]  NA  []   []   []   []   []   []   []   []   []   []   []    Scooter  [x]  NA  []   []   []   []   []   []   []   []   []   []   []    Power Wheelchair: standard joystick  []  NA  [x]   []   []   []   []   []   []   []   []   []   []    Power Wheelchair: alternative controls  []  NA  []   []   []   []   []   []   []   []   []   []   []    Summary:  The least costly alternative for independent functional mobility was found to be:    []  Crutch/Cane  []  Walker []  Manual w/c  []  Manual w/c with power assist   []  Scooter   [x]  Power w/c std joystick   []  Power w/c alternative control        []  Requires dependent care mobility Market researcher for [employer] are adequate for safe  mobility equipment operation  [x]   Yes []   No  Patient is willing and motivated to use recommended mobility equipment  [x]   Yes []   No       []  Patient is unable to safely operate mobility equipment independently and requires dependent care equipment Comments:           SENSATION and SKIN ISSUES:  Sensation [x]  Intact  []  Impaired []  Absent []  Hyposensate []  Hypersensate  []  Defensiveness  Location(s) of impairment:    Pressure Relief Method(s):  [x]  Lean side to side to offload (without risk of falling)  []   W/C push up (4+ times/hour for 15+ seconds) [x]  Stand up (without risk of falling)    []  Other: (Describe): Effective pressure relief  method(s) above can be performed consistently throughout the day: [x] Yes  []  No If not, Why?:  Skin Integrity Risk:       [x]  Low risk           []  Moderate risk            []  High risk  If high risk, explain:   Skin Issues/Skin Integrity  Current skin Issues  []  Yes [x]  No [x]  Intact  []   Red area   []   Open area  []  Scar tissue  []  At risk from prolonged sitting  Where: History of Skin Issues  []  Yes [x]  No Where : When: Stage: Hx of skin flap surgeries  []  Yes [x]  No Where:  When:  Pain: [x]  Yes []  No   Pain Location(s): bil lower LE, L shoulder pain, L knee pain Intensity scale: (0-10) : 8-9/10 constant pain How does pain interfere with mobility and/or MRADLs? - unable to stand for any functional length of time, able to perform short distance ambulation with walker (10 feet)        MAT EVALUATION:  Neuro-Muscular Status: (Tone, Reflexive, Responses, etc.)     [x]   Intact   []  Spasticity:  []  Hypotonicity  []  Fluctuating  []  Muscle Spasms  []  Poor Righting Reactions/Poor Equilibrium Reactions  []  Primal Reflex(s):    Comments:            COMMENTS:    POSTURE:     Comments:  Pelvis Anterior/Posterior:  [x]  Neutral   []  Posterior  []  Anterior  []  Fixed - No movement []  Tendency away from neutral []  Flexible []  Self-correction []  External correction Obliquity (viewed from front)  [x]  WFL []  R Obliquity []  L Obliquity  []  Fixed - No movement []  Tendency away from neutral []  Flexible []  Self-correction []  External correction Rotation  [x]  WFL []  R anterior []  L anterior  []  Fixed - No movement []  Tendency away from neutral []  Flexible []  Self-correction []  External correction Tonal Influence Pelvis:  [x]  Normal []  Flaccid []  Low tone []  Spasticity []  Dystonia []  Pelvis thrust []  Other:    Trunk Anterior/Posterior:  [x]  WFL []  Thoracic kyphosis []  Lumbar lordosis  []  Fixed - No movement []  Tendency away from neutral []   Flexible []  Self-correction []  External correction  [x]  WFL []  Convex to left  []  Convex to right []  S-curve   []  C-curve []  Multiple curves []  Tendency away from neutral []  Flexible []  Self-correction []  External correction Rotation of shoulders and upper trunk:  [x]  Neutral []  Left-anterior []  Right- anterior []  Fixed- no movement []  Tendency away from neutral []  Flexible []  Self correction []  External correction Tonal influence Trunk:  [x]  Normal []  Flaccid []   Low tone []  Spasticity []  Dystonia []  Other:   Head & Neck  [x]  Functional []  Flexed    []  Extended []  Rotated right  []  Rotated left []  Laterally flexed right []  Laterally flexed left []  Cervical hyperextension   [x]  Good head control []  Adequate head control []  Limited head control []  Absent head control Describe tone/movement of head and neck:      Lower Extremity Measurements: LE ROM:  Active ROM Right 07/04/2024 Left 07/04/2024  Hip flexion    Hip extension    Hip abduction    Hip adduction    Knee flexion    Knee extension    Ankle dorsiflexion    Ankle plantarflexion     (Blank rows = not tested)  LE MMT:  MMT Right 07/04/2024 Left 07/04/2024  Hip flexion    Hip extension    Hip abduction    Hip adduction    Knee flexion    Knee extension    Ankle dorsiflexion    Ankle plantarflexion     (Blank rows = not tested)  Hip positions:  [x]  Neutral   []  Abducted   []  Adducted  []  Subluxed   []  Dislocated   []  Fixed   []  Tendency away from neutral [x]  Flexible [x]  Self-correction []  External correction   Hip Windswept:[x]  Neutral  []  Right    []  Left  []  Subluxed   []  Dislocated   []  Fixed   []  Tendency away from neutral [x]  Flexible [x]  Self-correction []  External correction  LE Tone: [x]  Normal []  Low tone []  Spasticity []  Flaccid []  Dystonia []  Rocks/Extends at hip []  Thrust into knee extension []  Pushes legs downward into footrest  Foot positioning: ROM  Concerns: Dorsiflexed: []  Right   []  Left Plantar flexed: [x]  Right    [x]  Left Inversion: []  Right    []  Left Eversion: []  Right    []  Left  LE Edema: []  1+ (Barely detectable impression when finger is pressed into skin) []  2+ (slight indentation. 15 seconds to rebound) [x]  3+ (deeper indentation. 30 seconds to rebound) []  4+ (>30 seconds to rebound)  UE Measurements:  UPPER EXTREMITY ROM:   Active ROM Right 07/04/2024 Left 07/04/2024  Shoulder flexion    Shoulder abduction    Shoulder adduction    Elbow flexion    Elbow extension    Wrist flexion    Wrist extension    (Blank rows = not tested)  UPPER EXTREMITY MMT:  MMT Right 07/04/2024 Left 07/04/2024  Shoulder flexion    Shoulder abduction    Shoulder adduction    Elbow flexion    Elbow extension    Wrist flexion    Wrist extension    Pinch strength    Grip strength    (Blank rows = not tested)  Shoulder Posture:  Right Tendency towards Left  []   Functional []    [x]   Elevation [x]    []   Depression []    [x]   Protraction [x]    []   Retraction []    []   Internal rotation []    []   External rotation []    []   Subluxed []     UE Tone: [x]  Normal []  Flaccid []  Low tone []  Spasticity  []  Dystonia []  Other:   UE Edema: [x]  1+ (Barely detectable impression when finger is pressed into skin) []  2+ (slight indentation. 15 seconds to rebound) []  3+ (deeper indentation. 30 seconds to rebound) []  4+ (>30 seconds to rebound)  Wrist/Hand: Handedness: [x]  Right   []   Left   []  NA: Comments:  Right  Left  []   WNL []    []   Limitations []    []   Contractures []    []   Fisting []    []   Tremors []    []   Weak grasp []    []   Poor dexterity []    []   Hand movement non functional []    []   Paralysis []         MOBILITY BASE RECOMMENDATIONS and JUSTIFICATION:  MOBILITY BASE  JUSTIFICATION   Manufacturer:   Games developer:              Evo       [id number]         Color:  Seat Width:  16 Seat Depth 18   []  Manual mobility  base (continue below)   []  Scooter/POV  [x]  Power mobility base   Number of hours per day spent in above selected mobility base: 6-8 hours  Typical daily mobility base use Schedule: during the awake hours in the day   [x]  is not a safe, functional ambulator  []  limitation prevents from completing a MRADL(s) within a reasonable time frame    [x]  limitation places at high risk of morbidity or mortality secondary to  the attempts to perform a    MRADL(s)  [x]  limitation prevents accomplishing a MRADL(s) entirely  [x]  provide independent mobility  [x]  equipment is a lifetime medical need  [x]  walker or cane inadequate  [x]  any type manual wheelchair      inadequate  [x]  scooter/POV inadequate      []  requires dependent mobility          MANUAL MOBILITY      []  Standard manual wheelchair  K0001      Arm:    []  both []  right  []  left      Foot:   []  both []  right   []  left  []  self-propels wheelchair  []  will use on regular basis  []  chair fits throughout home  []  willing and motivated to use  []  propels with assistance     []  dependent use   []  Standard hemi-manual wheelchair  K0002      Arm:    []  both []  right  []  left      Foot:   []  both []  right   []  left  []  lower seat height required to foot propel  []  short stature  []  self-propels wheelchair  []  will use on regular basis  []  chair fits throughout home  []  willing and motivated to use   []  propels with assistance  []  dependent use   []  Lightweight manual wheelchair  K0003      Arm:    []  both []  right  []  left      Foot:   []  both  []  right  []  left                   []  hemi height required  []  medical condition and weight of  wheelchair affect ability to self      propel standard manual wheelchair in the residence  []  can and does self-propel (marginal propulsion skills)  []  daily use _________hours  []  chair fits throughout home  []  willing and motivated to use  []  lower seat height required to foot propel   []  short stature   []  High strength lightweight manual  wheelchair (Breezy Ultra 4)  K0004     Arm:    []   both []  right  []  left     Foot:   []  both []  right   []  left                                                                  []  hemi height required []  medical condition and weight of wheelchair affect ability to self propel while engaging in frequent MRADL(s) that cannot be performed in a standard or lightweight manual wheelchair  []  daily use _________hours  []  chair fits throughout home  []  willing and motivated to use  []  prevent repetitive use injuries   []  lower seat height required to foot propel  []  short stature    []  Ultra-lightweight manual wheelchair  K0005     Arm:    []  both []  right  []  left     Foot:   []  both []  right  []  left       []  hemi height required  []  heavy duty    Front seat to floor _____ inches      Rear seat to floor _____ inches      Back height _____ inches     Back angle ______ degrees      Front angle _____ degrees  []   full-time manual wheelchair user  []  Requires individualized fitting and optimal adjustments for multiple features that include adjustable axle configuration, fully adjustable center of gravity, wheel camber, seat and back angle, angle of seat slope, which cannot be accommodated by a K0001 through K0004 manual wheelchair  []  prevent repetitive use injuries  []  daily use_________hours   []  user has high activity patterns that frequently require  them  to go out into the community for the purpose of independently accomplishing high level MRADL activities. Examples of these might include a combination of; shopping, work, school, Photographer, childcare, independently loading and unloading from a vehicle etc.  []  lower seat height required to foot propel  []  short stature  []  heavy duty -  weight over 250lbs   []  Current chair is a K0005   manufacture:___________________  model:_________________  serial#____________________   age:_________    []  First time X9994 user (complete trial)  K0004 time and # of strokes to propel 30 feet: ________seconds _________strokes  X9994 time and # of strokes to propel 30 feet: ________seconds _________strokes  What was the result of the trial between the K0004 and K0005 manual wheelchair? ___    What features of the K0005 w/c are needed as compared to the K0004 base? Why?___    []  adjustable seat and back angle changes the angle of seat slope of the frame to attain a gravity assisted position for efficient propulsion and proper weight distribution along the frame     []  the front of the wheelchair will be configured higher than the back of the chair to allow gravity to assist the user with postural stability  []  the center of the wheel will be positioned for stability, safety and efficient propulsion  []  adjustable axle allows for vertical, horizontal, camber and overall width changes  throughout the wheels for adjustment of the client's exact needs and abilities.   []  adjustable axle increases the stability and function of the chair allowing for adjustment of the center of  gravity.   []  accommodates the client's anatomical position in the chair maximizing independence in mobility and maneuverability in all environments.   []  create a minimal fixed tilt-in space to assist in positioning.   []  Describe users full-time manual wheelchair activity patterns:___    []  Power assist Comments:  []  prevent repetitive use injuries  []  repetitive strain injury present in    shoulder girdle    []  shoulder pain is (> or =) to 7/10     during manual propulsion       Current Pain _____/10  []  requires conservation of energy to participate in MRADL(s) runable to propel up ramps or curbs using manual wheelchair  []  been K0005 user greater than one year  []  user unwilling to use power      wheelchair (reason): []  less expensive option to power   wheelchair   []  rim activated power assist -       decreased strength   []  Heavy duty manual wheelchair       K0006     Arm:    []  both []  right  []  left     Foot:   []  both []  right  []  left     []  hemi height required    []  Dependent base  []  user exceeds 250lbs  []  non-functional ambulator    []  extreme spasticity  []  over active movement   []  broken frame/hx of repeated     repairs  []  able to self-propel in residence       []  lower seat to floor height required  []  unable to self-propel in residence   []  Extra heavy duty manual wheelchair  K0007     Arm:    []  both []  right  []  left     Foot:   []  both []  right  []  left     []  hemi height required  []  Dependent base  []  user exceeds 300lbs  []  non-functional ambulator    []  able to self-propel in residence   []  lower seat to floor height required  []  unable to self-propel in residence     []  Manual wheelchair with tilt [phone]      (Manual "Tilt-n-Space")  []  patient is dependent for transfers  []  patient requires frequent       positioning for pressure relief   []  patient requires frequent      positioning for poor/absent trunk control        []  Stroller Base  []  infant/child   []  unable to propel manual      wheelchair  []  allows for growth  []  non-functional ambulator  []  non-functional UE  []  independent mobility is not a goal at this time    MANUAL FRAME OPTIONS      Push handles  []  extended   []  angle adjustable   []  standard  []  caregiver access  []  caregiver assist    []  allows "hooking" to enable      increased ability to perform ADLs or maintain balance   []  Angle Adjustable Back  []  postural control  []  control of tone/spasticity  []  accommodation of range of motion  []  UE functional control  []  accommodation for seating system    Rear wheel placement  []  std/fixed  [] fully adjustableramputee   []  camber ________degree  []  removable rear wheel  []  non-removable rear wheel  Wheel size _______  Wheel style_______________________  []  improved UE  access to wheels  []   increase propulsion ability  []  improved stability  []  changing angle in space for      improvement of postural stability  []  remove for transport    []  allow for seating system to fit on  base  []  amputee placement  []  1-arm drive access   r R  r L  []  enable propulsion of manual       wheelchair with one arm    []  amputee placement   Wheel rims/ Hand rims  []  Standard    []  Specialized-____ []  provide ability to propel manual   []  increase self-propulsion with hand wheelchair weakness/decreased grasp     []  Spoke protector/guard   []  prevent hands from getting caught in spokes   Tires:  []  pneumatic  []  flat free inserts  []  solid  Style:  []  decrease roll resistance              []  prevent frequent flats  []  increase shock absorbency  []  decrease maintenance   []  decrease pain from road shock    []  decrease spasms from road shock    Wheel Locks:    []  push []  pull []  scissor  []  lock wheels for transfers  []  lock wheels from rolling   Brake/wheel lock extension:  []  R  []  L  []  allow user to operate wheel locks due to decreased reach or strength   Caster housing:  Materials engineer size:                      Style:                                          []  suspension fork  []  maneuverability   []  stability of wheelchair   []  durability  []  maintenance  []  angle adjustment for posture  []  allow for feet to come under        wheelchair base  []  allows change in seat to floor  height   []  increase shock absorbency  []  decrease pain from road shock  []  decrease spasms from road    shock   []  Side guards  []  prevent clothing getting caught in wheel or becoming soiled   [] provide hip and pelvic stability  []  eliminates contact between body and wheels  []  limit hand contact with wheels   []  Anti-tippers      []  prevent wheelchair from tipping    backward  []  assist caregiver with curbs     POWER MOBILITY      []  Scooter/POV    []  can safely operate   []  can  safely transfer   []  has adequate trunk stability   []  cannot functionally propel  manual wheelchair    [x]  Power mobility base    []  non-ambulatory   [x]  cannot functionally propel manual wheelchair   [x]  cannot functionally and safely      operate scooter/POV  [x]  can safely operate power       wheelchair  [x]  home is accessible  [x]  willing to use power wheelchair     Tilt  []  Powered tilt on powered chair  []  Powered tilt on manual chair  []  Manual tilt on manual chair Comments:  []  change position for pressure      []  elief/cannot weight shift   []  change position against  gravitational force on head and      shoulders   []  decrease pain  []  blood pressure management   []  control autonomic dysreflexia  []  decrease respiratory distress  []  management of spasticity  []  management of low tone  []  facilitate postural control   []  rest periods   []  control edema  []  increase sitting tolerance   []  aid with transfers     Recline   []  Power recline on power chair  []  Manual recline on manual chair  Comments:    []  intermittent catheterization  []  manage spasticity  []  accommodate femur to back angle  []  change position for pressure relief/cannot weight shift rhigh risk of pressure sore development  []  tilt alone does not accomplish     effective pressure relief, maximum pressure relief achieved at -      _______ degrees tilt   _______ degrees recline   []  difficult to transfer to and from bed []  rest periods and sleeping in chair  []  repositioning for transfers  []  bring to full recline for ADL care  []  clothing/diaper changes in chair  []  gravity PEG tube feeding  []  head positioning  []  decrease pain  []  blood pressure management   []  control autonomic dysreflexia  []  decrease respiratory distress  []  user on ventilator     Elevator on mobility base  []  Power wheelchair  []  Scooter  []  increase Indep in transfers   []  increase Indep in ADLs    []  bathroom  function and safety  []  kitchen/cooking function and safety  []  shopping  []  raise height for communication at standing level  []  raise height for eye contact which reduces cervical neck strain and pain  []  drive at raised height for safety and navigating crowds  []  Other:   []  Vertical position system  (anterior tilt)     (Drive locks-out)    []  Stand       (Drive enabled)  []  independent weight bearing  []  decrease joint contractures  []  decrease/manage spasticity  []  decrease/manage spasms  []  pressure distribution away from   scapula, sacrum, coccyx, and ischial tuberosity  []  increase digestion and elimination   []  access to counters and cabinets  []  increase reach  []  increase interaction with others at eye level, reduces neck strain  []  increase performance of       MRADL(s)      Power elevating legrest    []  Center mount (Single) 85-170 degrees       []  Standard (Pair) 100-170 degrees  []  position legs at 90 degrees, not available with std power ELR  []  center mount tucks into chair to decrease turning radius in home, not available with std power ELR  []  provide change in position for LE  []  elevate legs during recline    []  maintain placement of feet on      footplate  []  decrease edema  []  improve circulation  []  actuator needed to elevate legrest  []  actuator needed to articulate legrest preventing knees from flexing  []  Increase ground clearance over      curbs  []   STD (pair) independently                     elevate legrest   POWER WHEELCHAIR CONTROLS      Controls/input device  []  Expandable  [x]  Non-expandable  []  Proportional  []  Right Hand []  Left Hand  []  Non-proportional/switches/head-array  []   Electrical/proximity         []   Mechanical      Manufacturer:___________________   Type:________________________ [x]  provides access for controlling wheelchair  []  programming for accurate control  []  progressive disease/changing condition  []  required for  alternative drive      controls       []  lacks motor control to operate  proportional drive control  []  unable to understand proportional controls  []  limited movement/strength  []  extraneous movement / tremors / ataxic / spastic       []  Upgraded electronics controller/harness    []  Single power (tilt or recline)   []  Expandable    []  Non-expandable plus   []  Multi-power (tilt, recline, power legrest, power seat lift, vertical positioning system, stand)  []  allows input device to communicate with drive motors  []  harness provides necessary connections between the controller, input device, and seat functions     []  needed in order to operate power seat functions through joystick/ input device  []  required for alternative drive controls     []  Enhanced display  []  required to connect all alternative drive controls   []  required for upgraded joystick      (lite-throw, heavy duty, micro)  []  Allows user to see in which mode and drive the wheelchair is set; necessary for alternate controls       []  Upgraded tracking electronics  []  correct tracking when on uneven surfaces makes switch driving more efficient and less fatiguing  []  increase safety when driving  []  increase ability to traverse thresholds    []  Safety / reset / mode switches     Type:    []  Used to change modes and stop the wheelchair when driving     [x]  Mount for joystick / input device/switches  [x]  swing away for access or transfers   [x]  attaches joystick / input device / switches to wheelchair   []  provides for consistent access  []  midline for optimal placement    []  Attendant controlled joystick plus     mount  []  safety  []  long distance driving  []  operation of seat functions  []  compliance with transportation regulations    [x]  Battery U1 x 2 [x]  required to power (power assist / scooter/ power wc / other):   []  Power inverter (24V to 12V)  []  required for ventilator / respiratory equipment / other:     CHAIR  OPTIONS MANUAL & POWER      Armrests   [x]  adjustable height []  removable  []  swing away []  fixed  [x]  flip back  []  reclining  []  full length pads []  desk []  tube arms []  gel pads  [x]  provide support with elbow at 90    [x]  remove/flip back/swing away for  transfers  [x]  provide support and positioning of upper body    [x]  allow to come closer to table top  []  remove for access to tables  [x]  provide support for w/c tray  []  change of height/angles for variable activities   []  Elbow support / Elbow stop  []  keep elbow positioned on arm pad  []  keep arms from falling off arm pad  during tilt and/or recline   Upper Extremity Support  []  Arm trough  []   R  []   L  Style:  []  swivel mount []  fixed mount   []  posterior hand support  []   tray  [x]  full tray  []  joystick cut out  []   R  []   L  Style:  [x]  decrease gravitational pull on      shoulders  [x]  provide support to increase UE  function  []  provide hand support in natural    position  []  position flaccid UE  []  decrease subluxation    []  decrease edema       []  manage spasticity   []  provide midline positioning  [x]  provide work surface  []  placement for AAC/ Computer/ EADL       Hangers/ Legrests   []  ______ degree  []  Elevating []  articulating  []  swing away []  fixed []  lift off  []  heavy duty  []  adjustable knee angle  []  adjustable calf panel   []  longer extension tube              []  provide LE support  []  maintain placement of feet on      footplate   []  accommodate lower leg length  []  accommodate to hamstring       tightness  []  enable transfers  []  provide change in position for LE's  []  elevate legs during recline    []  decrease edema  []  durability      Foot support   [x]  footplate []  R []  L [x]  flip up           []  Depth adjustable   []  angle adjustable  []  foot board/one piece    [x]  provide foot support  []  accommodate to ankle ROM  [x]  allow foot to go under wheelchair base  []  enable  transfers     []  Shoe holders  []  position foot    []  decrease / manage spasticity  []  control position of LE  []  stability    []  safety     []  Ankle strap/heel      loops  []  support foot on foot support  []  decrease extraneous movement  []  provide input to heel   []  protect foot     []  Amputee adapter []  R  []  L     Style:                  Size:  []  Provide support for stump/residual extremity    []  Transportation tie-down  []  to provide crash tested tie-down brackets    []  Crutch/cane holder    []  O2 holder    []  IV hanger   []  Ventilator tray/mount    []  stabilize accessory on wheelchair       Component  Justification     [x]  Seat cushion      []  accommodate impaired sensation  []  decubitus ulcers present or history  []  unable to shift weight  []  increase pressure distribution  []  prevent pelvic extension  []  custom required "off-the-shelf"    seat cushion will not accommodate deformity  [x]  stabilize/promote pelvis alignment  [x]  stabilize/promote femur alignment  []  accommodate obliquity  []  accommodate multiple deformity  []  incontinent/accidents  [x]  low maintenance     []  seat mounts                 []  fixed []  removable  []  attach seat platform/cushion to wheelchair frame    []  Seat wedge    []  provide increased aggressiveness of seat shape to decrease sliding  down in the seat  []  accommodate ROM        []  Cover replacement   []  protect back or seat cushion  []  incontinent/accidents    []   Solid seat / insert    []  support cushion to prevent      hammocking  []  allows attachment of cushion to mobility base    []  Lateral pelvic/thigh/hip     support (Guides)     []  decrease abduction  []  accommodate pelvis  []  position upper legs  []  accommodate spasticity  []  removable for transfers     []  Lateral pelvic/thigh      supports mounts  []  fixed   []  swing-away   []  removable  []  mounts lateral pelvic/thigh supports     []  mounts lateral pelvic/thigh supports  swing-away or removable for transfers    []  Medial thigh support (Pommel)  [] decrease adduction  [] accommodate ROM  []  remove for transfers   []  alignment      []  Medial thigh   []  fixed      support mounts      []  swing-away   []  removable  []  mounts medial thigh supports   []  Mounts medial supports swing- away or removable for transfers       Component  Justification   [x]  Back       [x]  provide posterior trunk support []  facilitate tone  [x]  provide lumbar/sacral support []  accommodate deformity  [x]  support trunk in midline   []  custom required "off-the-shelf" back support will not accommodate deformity   []  provide lateral trunk support []  accommodate or decrease tone            []  Back mounts  []  fixed  []  removable  []  attach back rest/cushion to wheelchair frame   []  Lateral trunk      supports  []  R []  L  []  decrease lateral trunk leaning  []  accommodate asymmetry    []  contour for increased contact  []  safety    []  control of tone    []  Lateral trunk      supports mounts  []  fixed  []  swing-away   []  removable  []  mounts lateral trunk supports     []  Mounts lateral trunk supports swing-away or removable for transfers   []  Anterior chest      strap, vest     []  decrease forward movement of shoulder  []  decrease forward movement of trunk  []  safety/stability  []  added abdominal support  []  trunk alignment  []  assistance with shoulder control   []  decrease shoulder elevation    []  Headrest      []  provide posterior head support  []  provide posterior neck support  []  provide lateral head support  []  provide anterior head support  []  support during tilt and recline  []  improve feeding     []  improve respiration  []  placement of switches  []  safety    []  accommodate ROM   []  accommodate tone  []  improve visual orientation   []  Headrest           []  fixed []  removable []  flip down      Mounting hardware   []  swing-away laterals/switches  []  mount headrest   []   mounts headrest flip down or  removable for transfers  []  mount headrest swing-away laterals   []  mount switches     []  Neck Support    []  decrease neck rotation  []  decrease forward neck flexion   Pelvic Positioner    [x]  std hip belt          []  padded hip belt  []  dual pull hip belt  []   four point hip belt  []  stabilize tone  [x]  decrease falling out of chair  []  prevent excessive extension  []  special pull angle to control      rotation  []  pad for protection over boney   prominence  []  promote comfort    []  Essential needs        bag/pouch   []  medicines []  special food rorthotics []  clothing changes  []  diapers  []  catheter/hygiene []  ostomy supplies   The above equipment has a life- long use expectancy.  Growth and changes in medical and/or functional conditions would be the exceptions.   SUMMARY:    ASSESSMENT: Functional testing: TUG: 96 seconds with 2 WW.  CLINICAL IMPRESSION: Patient is a 83 y.o. female who was seen today for physical therapy evaluation and treatment for power mobility assessment. Patient has PMH that includes bil LE gout, O2 dependency,  shoulder pain, knee pain, and debility. Patient is currently using rolling walker for in home mobility. Based on Timed up and go test score of 96 sec with Rolator, patient's mobility is significantly impaired and is at high risk for fall considering her independent living status. Pt currently is dependant on family members for cooking, cleaning, showers, ADLs, and MRADLs. Any type of manual wheelchair is not applicable considering her age and comorbidity. Least expensive option that will meet patiet's medical neccessasity, improve independence, and decrease caregiver dependence will be group 2 power wheelchair with a full tray to provide a working surface for carry things as she moves from room to room.   OBJECTIVE IMPAIRMENTS Abnormal gait, cardiopulmonary status limiting activity, decreased activity tolerance, decreased  balance, decreased endurance, decreased knowledge of use of DME, decreased mobility, difficulty walking, decreased strength, increased edema, increased muscle spasms, impaired flexibility, impaired UE functional use, and pain.   ACTIVITY LIMITATIONS carrying, lifting, bending, standing, stairs, transfers, bed mobility, bathing, and dressing  PARTICIPATION LIMITATIONS: meal prep, cleaning, laundry, driving, shopping, and community activity  PERSONAL FACTORS Age, Time since onset of injury/illness/exacerbation, Transportation, and 3+ comorbidities: gout, LE swelling, knee DJD, shoulder pain are also affecting patient's functional outcome.   REHAB POTENTIAL: Good  CLINICAL DECISION MAKING: Stable/uncomplicated  EVALUATION COMPLEXITY: Moderate                                   GOALS: One time visit. No goals established.    PLAN: PT FREQUENCY: one time visit    Raj LOISE Blanch, PT 07/04/2024, 10:45 AM    I concur with the above findings and recommendations of the therapist:  Physician name printed:         Physician's signature:      Date:

## 2024-07-14 ENCOUNTER — Ambulatory Visit (INDEPENDENT_AMBULATORY_CARE_PROVIDER_SITE_OTHER): Admitting: Internal Medicine

## 2024-07-14 ENCOUNTER — Encounter: Payer: Self-pay | Admitting: Internal Medicine

## 2024-07-14 VITALS — BP 134/68 | HR 54 | Temp 98.0°F | Ht <= 58 in | Wt 153.0 lb

## 2024-07-14 DIAGNOSIS — J4540 Moderate persistent asthma, uncomplicated: Secondary | ICD-10-CM

## 2024-07-14 DIAGNOSIS — M1009 Idiopathic gout, multiple sites: Secondary | ICD-10-CM | POA: Diagnosis not present

## 2024-07-14 DIAGNOSIS — I10 Essential (primary) hypertension: Secondary | ICD-10-CM

## 2024-07-14 DIAGNOSIS — M150 Primary generalized (osteo)arthritis: Secondary | ICD-10-CM

## 2024-07-14 DIAGNOSIS — E782 Mixed hyperlipidemia: Secondary | ICD-10-CM

## 2024-07-14 DIAGNOSIS — R5383 Other fatigue: Secondary | ICD-10-CM | POA: Diagnosis not present

## 2024-07-14 LAB — LIPID PANEL
Cholesterol: 128 mg/dL (ref 0–200)
HDL: 45.8 mg/dL (ref >39.00)
LDL Cholesterol: 62 mg/dL (ref 0–99)
NonHDL: 82.04
Total CHOL/HDL Ratio: 3
Triglycerides: 100 mg/dL (ref 0.0–149.0)
VLDL: 20 mg/dL (ref 0.0–40.0)

## 2024-07-14 LAB — COMPREHENSIVE METABOLIC PANEL WITH GFR
ALT: 17 U/L (ref 0–35)
AST: 21 U/L (ref 0–37)
Albumin: 3.9 g/dL (ref 3.5–5.2)
Alkaline Phosphatase: 59 U/L (ref 39–117)
BUN: 12 mg/dL (ref 6–23)
CO2: 29 meq/L (ref 19–32)
Calcium: 9.6 mg/dL (ref 8.4–10.5)
Chloride: 106 meq/L (ref 96–112)
Creatinine, Ser: 1.04 mg/dL (ref 0.40–1.20)
GFR: 50.44 mL/min — ABNORMAL LOW (ref >60.00)
Glucose, Bld: 88 mg/dL (ref 70–99)
Potassium: 4.3 meq/L (ref 3.5–5.1)
Sodium: 142 meq/L (ref 135–145)
Total Bilirubin: 0.7 mg/dL (ref 0.2–1.2)
Total Protein: 6 g/dL (ref 6.0–8.3)

## 2024-07-14 LAB — CBC
HCT: 37.1 % (ref 36.0–46.0)
Hemoglobin: 12.4 g/dL (ref 12.0–15.0)
MCHC: 33.5 g/dL (ref 30.0–36.0)
MCV: 93 fl (ref 78.0–100.0)
Platelets: 249 K/uL (ref 150.0–400.0)
RBC: 3.99 Mil/uL (ref 3.87–5.11)
RDW: 13.6 % (ref 11.5–15.5)
WBC: 4.6 K/uL (ref 4.0–10.5)

## 2024-07-14 LAB — VITAMIN B12: Vitamin B-12: 240 pg/mL (ref 211–911)

## 2024-07-14 LAB — VITAMIN D 25 HYDROXY (VIT D DEFICIENCY, FRACTURES): VITD: 52.77 ng/mL (ref 30.00–100.00)

## 2024-07-14 LAB — HEMOGLOBIN A1C: Hgb A1c MFr Bld: 5 % (ref 4.6–6.5)

## 2024-07-14 LAB — TSH: TSH: 0.95 u[IU]/mL (ref 0.35–5.50)

## 2024-07-14 NOTE — Assessment & Plan Note (Signed)
 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 She is impaired in her ADLs including dressing, bathing, cooking which will be positively impacted by power mobility device.

## 2024-07-14 NOTE — Assessment & Plan Note (Signed)
 Her chronic asthma has caused reduction in lung function which contributed to her mobility and activity tolerance. She is impaired in ADLS such as bathing, dressing, cooking which will be improved by use of power mobility device. She does qualify for same and I have recommended to proceed with obtaining power mobility device.

## 2024-07-14 NOTE — Assessment & Plan Note (Signed)
 Has disease in joints due to this over the years. The pain from this limits activity. Her ADLS such as bathing, dressing, cooking are limited by mobility and would be benefited by power mobility device.

## 2024-07-14 NOTE — Progress Notes (Signed)
 Subjective:   Patient ID: Heather Grant , female    DOB: 10-15-1941, 83 y.o.   MRN: [id number]  Discussed the use of AI scribe software for clinical note transcription with the patient, who gave verbal consent to proceed.  History of Present Illness Heather Grant  is an 83 year old female who presents for a wheelchair assessment due to chronic mobility issues.  She has chronic mobility issues characterized by difficulty walking and pain, leading to a history of falls or near falls when using mobility aids like a cane or walker. She finds these aids inadequate and unsuitable for her home environment.  She uses a shower chair or bench to assist with bathing due to her mobility challenges. Her current weight is approximately 153 pounds, as last recorded in March.  She was assessed by physical therapy on August 5th.  The patient is an 83 YO female coming in for wheelchair assessment.  Current weight 153 lb and ht 4'9''  ADLs movement within the house that would be aided by the mobility device, cooking, bathing, dressing.  She is physically and mentally capable of using recommended mobility device safely in her home.   She has tried in past other mobility devices and has had falls. Her mobility issues will not be resolved with cane or walker or scooter due to safety concerns and risk of falls. She has had past falls and has a high fear of falling currently.   She had PT assessment done by [facility] on 07/04/24 and I have reviewed that assessment in its full detail and agree with the assessment copied and pasted below:   OUTPATIENT PHYSICAL THERAPY WHEELCHAIR EVALUATION     Patient Name: Heather Grant  MRN: [id number] DOB:24-Dec-1940, 83 y.o., female Today's Date: 07/04/2024   END OF SESSION:   PT End of Session - 07/04/24 1045       Visit Number 1     Number of Visits 1     PT Start Time 1045     PT Stop Time 1145     PT Time Calculation (min) 60 min      Equipment Utilized During Treatment Gait belt     Activity Tolerance Patient tolerated treatment well     Behavior During Therapy WFL for tasks assessed/performed                  Past Medical History:  Diagnosis Date   Anemia     Arthritis      hands and knees. May be hips   Gout      great toe, ankles   History of home oxygen  therapy      prn   Hyperlipidemia     Hypertension     Migraines     Moderate persistent asthma     Nocturnal dyspnea      ONO 2010 with several readings  below 88%   Obesity, Class II, BMI 35-39.9     OSA (obstructive sleep apnea)      mild by study Dec '09. AHI 5.7   Plantar fasciitis     Varicella               Past Surgical History:  Procedure Laterality Date   CARPAL TUNNEL RELEASE        right   cataract        right eye with IOL   COLONOSCOPY WITH PROPOFOL  N/A 04/26/2018    Procedure: COLONOSCOPY WITH PROPOFOL ;  Surgeon: Legrand Victory LITTIE DOUGLAS, MD;  Location: [facility];  Service: Gastroenterology;  Laterality: N/A;   COLONOSCOPY WITH PROPOFOL  N/A 02/28/2024    Procedure: COLONOSCOPY WITH PROPOFOL ;  Surgeon: Legrand Victory LITTIE DOUGLAS, MD;  Location: [facility];  Service: Gastroenterology;  Laterality: N/A;   DILATATION & CURETTAGE/HYSTEROSCOPY WITH MYOSURE N/A 02/09/2024    Procedure: DILATATION & CURETTAGE/HYSTEROSCOPY WITH MYOSURE WITH CERVICAL BIOPSY;  Surgeon: Viktoria Comer SAUNDERS, MD;  Location: [facility];  Service: Gynecology;  Laterality: N/A;   DILATION AND CURETTAGE OF UTERUS       DILATION AND EVACUATION N/A 11/15/2023    Procedure: Cervical Dilation;  Surgeon: Henry Slough, MD;  Location: [facility];  Service: Gynecology;  Laterality: N/A;   G5P4        NSVD   OPERATIVE ULTRASOUND N/A 02/09/2024    Procedure: OPERATIVE ULTRASOUND GUIDANCE;  Surgeon: Viktoria Comer SAUNDERS, MD;  Location: [facility];  Service: Gynecology;  Laterality: N/A;   POLYPECTOMY   02/28/2024    Procedure: POLYPECTOMY;  Surgeon: Legrand Victory LITTIE DOUGLAS, MD;  Location: [facility];  Service: Gastroenterology;;            Patient Active Problem List    Diagnosis Date Noted   Benign neoplasm of transverse colon 02/28/2024   Thickened endometrium 02/09/2024   Colouterine fistula 11/25/2023   Moderate persistent asthma 11/13/2023   Numbness and tingling of both feet 10/16/2022   Cyst, dermoid, scalp and neck 02/06/2022   Headache 08/31/2019   Osteopenia 08/31/2019   Chronic venous insufficiency 12/04/2016   GERD (gastroesophageal reflux disease) 06/19/2016   Chronic rhinitis 03/15/2014   Routine general medical examination at a health care facility 07/08/2011   Osteoarthritis 07/08/2011   Essential hypertension     OSA (obstructive sleep apnea)     Gout     Hyperlipidemia        PCP: Dr. Almarie Cleveland   REFERRING PROVIDER: Cleveland Almarie LABOR, MD   THERAPY DIAG:  Other abnormalities of gait and mobility   Muscle weakness (generalized)   Rationale for Evaluation and Treatment Rehabilitation   SUBJECTIVE:                                                                                                                                                                                            SUBJECTIVE STATEMENT: Pt presents for wheelchair evaluation. Painful toenails interfere with ambulation. Patient has difficulty with ambulation with or without shoes. Pain is relieved some with periodic professional debridement by podiatry Painful porokeratotic lesions are aggravated when weightbearing. Pt has hx of gout issues in bil lower LE, HTN, O2 dependant 2  L at home with concentrator, asthma. Pt lives by herself at home. Pt has fmaily that checks on her daily. Pt reports that she takes showers 2x/day with shower bench and with help of her family. Family helps with cooking, cleaning and home management. Pt reports of L shoulder pain and can't reach overhead.     PRECAUTIONS: Fall   RED FLAGS: None     WEIGHT BEARING RESTRICTIONS No       OCCUPATION: retired   PLOF:  Needs assistance with ADLs, Needs assistance with homemaking, Needs assistance with gait, and Needs assistance with transfers   PATIENT GOALS: get a power chair            MEDICAL HISTORY:      Primary diagnosis onset: 05/30/2024       Medical Diagnosis with ICD-10 code: J45.40 (ICD-10-CM) - Moderate persistent asthma without complication  R20.0,R20.2 (ICD-10-CM) - Numbness and tingling of both feet  M15.0 (ICD-10-CM) - Primary osteoarthritis involving multiple joints    [] Progressive disease  Relevant future surgeries:     Height: 4' 9 Weight: 153 lbs Explain recent changes or trends in weight:      History:      Past Medical History:  Diagnosis Date   Anemia     Arthritis      hands and knees. May be hips   Gout      great toe, ankles   History of home oxygen  therapy      prn   Hyperlipidemia     Hypertension     Migraines     Moderate persistent asthma     Nocturnal dyspnea      ONO 2010 with several readings  below 88%   Obesity, Class II, BMI 35-39.9     OSA (obstructive sleep apnea)      mild by study Dec '09. AHI 5.7   Plantar fasciitis     Varicella                         Cardio Status:           Functional Limitations:   [x] Intact  []  Impaired      Respiratory Status:           Functional Limitations:   [] Intact  [] Impaired   [x] SOB [] COPD [x] O2 Dependent __2____LPM  [] Ventilator Dependent  Resp equip:                                                     Objective Measure(s):   Orthotics:   [] Amputee:                                                             [] Prosthesis:          HOME ENVIRONMENT:  [] House [] Condo/town home [x] Apartment [] Asst living [] LTCF         [] Own  [x] Rent   [x] Lives alone [] Lives with others -                             Hours  without assistance: 24  [x] Home is accessible to patient                                  Storage of wheelchair:  [x] In home   [] Other Comments:           COMMUNITY :     TRANSPORTATION:  [] Car [] Research officer, political party [] Adapted w/c Lift []  Ambulance [] Other:                     [] Sits in wheelchair during transport   Where is w/c stored during transport?  [] [employer]  []  [employer]  r   [] Self-Driver       Drive while in  Biomedical scientist [] yes [x] no   Employment and/or school:  Specific requirements pertaining to mobility        Other:  COMMUNICATION:  Verbal Communication  [x] WFL [] receptive [] WFL [] expressive [] Understandable  [] Difficult to understand  [] non-communicative  Primary Language:___English___________ 2nd:_____________  Communication provided by:[x] Patient [x] Family [] Caregiver [] Translator   [] Uses an Paramedic device     Manufacturer/Model :                                                                                       MOBILITY/BALANCE:  Sitting Balance  Standing Balance  Transfers  Ambulation   [x] WFL      [] WFL  [] Independent  []  Independent   [] Uses UE for balance in sitting Comments:  [] Uses UE/device for stability Comments:  [x]  Min assist  []  Ambulates independently with       device:___________________      []  Mod assist  [x]  Able to ambulate __10____ feet        safely/functionally/independently   []  Min assist  [x]  Min assist  []  Max assist  []  Non-functional ambulator         History/High risk of falls   []  Mod assist  []  Mod assist  []  Dependent  []  Unable to ambulate   []  Max  assist  []  Max assist  Transfer method:[] 1 person [] 2 person [] sliding board [] squat pivot [x] stand pivot [] mechanical patient lift  [] other:   []  Unable  []  Unable    Fall History: # of falls in the past 6 months? 0 # of "near" falls in the past 6 months? Intermittently, pt reports of mostly sedintary ADLs, gets up 1 or 2x day with family to do little walking     CURRENT SEATING / MOBILITY:      Current Mobility Device: [] None [x] Cane/Walker [] Manual [] Dependent [] Dependent w/ Tilt rScooter  [] Power  (type of control):   Manufacturer:  Model:  Serial #:   Size:  Color:  Age:   Purchased by whom:   Current condition of mobility base:    Current seating system:  Age of seating system:    Describe posture in present seating system:    Is the current mobility meeting medical necessity?:  [] Yes [x] No Describe: Pt has difficulty with performing standing/walking and has to rely on family for some cooking, cleaning, ambulation.                                       Ability to complete Mobility-Related Activities of Daily Living (MRADL's) with Current Mobility Device:   Move room to room  [] Independent  [x] Min [] Mod [] Max assist  [] Unable  Comments: uses RW, shower bench, family assist with cooking, cleaning, showers, LE dressing on occaison  Meal prep  [] Independent  [] Min [] Mod [] Max assist  [x] Unable    Feeding  [x] Independent  [] Min [] Mod [] Max assist  [] Unable    Bathing  [] Independent  [] Min [x] Mod [] Max assist  [] Unable    Grooming  [x] Independent  [] Min [] Mod [] Max assist  [] Unable    UE dressing  [] Independent  [x] Min [] Mod [] Max assist  [] Unable    LE dressing  [] Independent   [x] Min [] Mod [] Max assist  [] Unable    Toileting  [x] Independent  [] Min [] Mod [] Max assist  [] Unable    Bowel Mgt: [x]  Continent []  Incontinent []  Accidents []  Diapers []  Colostomy []  Bowel Program:  Bladder Mgt: [x]  Continent []  Incontinent []  Accidents []  Diapers []  Urinal []  Intermittent Cath []  Indwelling Cath []  Supra-pubic Cath                  Current Mobility Equipment Trialed/ Ruled Out:    Does not meet mobility needs due to:    Mark all boxes that indicate inability to use the specific equipment listed     Meets needs for safe  independent functional  ambulation  / mobility    Risk of  Falling or History of Falls    Enviromental limitations      Cognition    Safety concerns with  physical ability    Decreased /  limitations endurance  & strength     Decreased / limitations  motor skills  & coordination    Pain    Pace /  Speed    Cardiac and/or  respiratory condition    Contra - indicated by diagnosis   Cane/Crutches  []   [x]   []   []   [x]   [x]   [x]   []   [x]   []   []    Walker / Rollator  []  NA   []   [x]   []   []   [x]   [x]   [x]   []   [x]   []   []     Manual Wheelchair [id number]:  [x]  NA  []   []   []   []   []   []   []   []   []   []   []    Manual W/C (K0005) with power assist  [x]  NA  []   []   []   []   []   []   []   []   []   []   []    Scooter  [x]  NA  []   []   []   []   []   []   []   []   []   []   []    Power Wheelchair: standard joystick  []  NA  [x]   []   []   []   []   []   []   []   []   []   []    Power Wheelchair: alternative controls  []  NA  []   []   []   []   []   []   []   []   []   []   []   Summary:  The least costly alternative for independent functional mobility was found to be:    []  Crutch/Cane  []  Walker []  Manual w/c  []  Manual w/c with power assist   []  Scooter   [x]  Power w/c std joystick   []  Power w/c alternative control        []  Requires dependent care mobility device   Cabin crew for [employer] skills are adequate for safe mobility equipment operation  [x]   Yes []   No  Patient is willing and motivated to use recommended mobility equipment  [x]   Yes []   No       []  Patient is unable to safely operate mobility equipment independently and requires dependent care equipment Comments:                      SENSATION and SKIN ISSUES:      Sensation [x]  Intact  []  Impaired []  Absent []  Hyposensate []  Hypersensate  []  Defensiveness  Location(s) of impairment:      Pressure Relief Method(s):  [x]  Lean side to side to offload (without risk of falling)  []   W/C push up (4+ times/hour for 15+ seconds) [x]  Stand up (without risk of falling)    []  Other: (Describe): Effective pressure relief method(s) above can be performed consistently throughout the day: [x] Yes  []  No If not,  Why?:   Skin Integrity Risk:       [x]  Low risk           []  Moderate risk            []  High risk  If high risk, explain:   Skin Issues/Skin Integrity  Current skin Issues  []  Yes [x]  No [x]  Intact  []   Red area   []   Open area  []  Scar tissue  []  At risk from prolonged sitting  Where: History of Skin Issues  []  Yes [x]  No Where : When: Stage: Hx of skin flap surgeries  []  Yes [x]  No Where:  When:  Pain: [x]  Yes []  No   Pain Location(s): bil lower LE, L shoulder pain, L knee pain Intensity scale: (0-10) : 8-9/10 constant pain How does pain interfere with mobility and/or MRADLs? - unable to stand for any functional length of time, able to perform short distance ambulation with walker (10 feet)            MAT EVALUATION:     Neuro-Muscular Status: (Tone, Reflexive, Responses, etc.)     [x]   Intact   []  Spasticity:  []  Hypotonicity  []  Fluctuating  []  Muscle Spasms  []  Poor Righting Reactions/Poor Equilibrium Reactions  []  Primal Reflex(s):     Comments:             COMMENTS:     POSTURE:         Comments:  Pelvis Anterior/Posterior:   [x]  Neutral   []  Posterior  []  Anterior   []  Fixed - No movement []  Tendency away from neutral []  Flexible []  Self-correction []  External correction Obliquity (viewed from front)   [x]  WFL []  R Obliquity []  L Obliquity   []  Fixed - No movement []  Tendency away from neutral []  Flexible []  Self-correction []  External correction Rotation   [x]  WFL []  R anterior []  L anterior   []  Fixed - No movement []  Tendency away from neutral []  Flexible []  Self-correction []  External correction Tonal Influence Pelvis:   [x]  Normal []  Flaccid []   Low tone []  Spasticity []  Dystonia []  Pelvis thrust []  Other:     Trunk Anterior/Posterior:   [x]  WFL []  Thoracic kyphosis []  Lumbar lordosis   []  Fixed - No movement []  Tendency away from neutral []  Flexible []  Self-correction []  External correction   [x]  WFL []   Convex to left  []  Convex to right []  S-curve   []  C-curve []  Multiple curves []  Tendency away from neutral []  Flexible []  Self-correction []  External correction Rotation of shoulders and upper trunk:   [x]  Neutral []  Left-anterior []  Right- anterior []  Fixed- no movement []  Tendency away from neutral []  Flexible []  Self correction []  External correction Tonal influence Trunk:   [x]  Normal []  Flaccid []  Low tone []  Spasticity []  Dystonia []  Other:   Head & Neck   [x]  Functional []  Flexed    []  Extended []  Rotated right  []  Rotated left []  Laterally flexed right []  Laterally flexed left []  Cervical hyperextension     [x]  Good head control []  Adequate head control []  Limited head control []  Absent head control Describe tone/movement of head and neck:         Lower Extremity Measurements: LE ROM:   Active ROM Right 07/04/2024 Left 07/04/2024  Hip flexion      Hip extension      Hip abduction      Hip adduction      Knee flexion      Knee extension      Ankle dorsiflexion      Ankle plantarflexion       (Blank rows = not tested)   LE MMT:   MMT Right 07/04/2024 Left 07/04/2024  Hip flexion      Hip extension      Hip abduction      Hip adduction      Knee flexion      Knee extension      Ankle dorsiflexion      Ankle plantarflexion       (Blank rows = not tested)   Hip positions:  [x]  Neutral   []  Abducted   []  Adducted  []  Subluxed   []  Dislocated   []  Fixed   []  Tendency away from neutral [x]  Flexible [x]  Self-correction []  External correction     Hip Windswept:[x]  Neutral  []  Right    []  Left  []  Subluxed   []  Dislocated   []  Fixed   []  Tendency away from neutral [x]  Flexible [x]  Self-correction []  External correction   LE Tone: [x]  Normal []  Low tone []  Spasticity []  Flaccid []  Dystonia []  Rocks/Extends at hip []  Thrust into knee extension []  Pushes legs downward into footrest   Foot positioning: ROM Concerns: Dorsiflexed: []   Right   []  Left Plantar flexed: [x]  Right    [x]  Left Inversion: []  Right    []  Left Eversion: []  Right    []  Left   LE Edema: []  1+ (Barely detectable impression when finger is pressed into skin) []  2+ (slight indentation. 15 seconds to rebound) [x]  3+ (deeper indentation. 30 seconds to rebound) []  4+ (>30 seconds to rebound)   UE Measurements:  UPPER EXTREMITY ROM:    Active ROM Right 07/04/2024 Left 07/04/2024  Shoulder flexion      Shoulder abduction      Shoulder adduction      Elbow flexion      Elbow extension      Wrist flexion      Wrist extension      (  Blank rows = not tested)   UPPER EXTREMITY MMT:   MMT Right 07/04/2024 Left 07/04/2024  Shoulder flexion      Shoulder abduction      Shoulder adduction      Elbow flexion      Elbow extension      Wrist flexion      Wrist extension      Pinch strength      Grip strength      (Blank rows = not tested)   Shoulder Posture:   Right Tendency towards Left  []   Functional []    [x]   Elevation [x]    []   Depression []    [x]   Protraction [x]    []   Retraction []    []   Internal rotation []    []   External rotation []    []   Subluxed []      UE Tone: [x]  Normal []  Flaccid []  Low tone []  Spasticity  []  Dystonia []  Other:    UE Edema: [x]  1+ (Barely detectable impression when finger is pressed into skin) []  2+ (slight indentation. 15 seconds to rebound) []  3+ (deeper indentation. 30 seconds to rebound) []  4+ (>30 seconds to rebound)   Wrist/Hand: Handedness: [x]  Right   []  Left   []  NA: Comments:  Right   Left  []   WNL []    []   Limitations []    []   Contractures []    []   Fisting []    []   Tremors []    []   Weak grasp []    []   Poor dexterity []    []   Hand movement non functional []    []   Paralysis []            MOBILITY BASE RECOMMENDATIONS and JUSTIFICATION:      MOBILITY BASE  JUSTIFICATION   Manufacturer:   Games developer:              Evo       [id number]         Color:  Seat Width:  16 Seat Depth 18    []  Manual mobility base (continue below)   []  Scooter/POV  [x]  Power mobility base    Number of hours per day spent in above selected mobility base: 6-8 hours   Typical daily mobility base use Schedule: during the awake hours in the day     [x]  is not a safe, functional ambulator  []  limitation prevents from completing a MRADL(s) within a reasonable time frame    [x]  limitation places at high risk of morbidity or mortality secondary to  the attempts to perform a    MRADL(s)  [x]  limitation prevents accomplishing a MRADL(s) entirely  [x]  provide independent mobility  [x]  equipment is a lifetime medical need  [x]  walker or cane inadequate  [x]  any type manual wheelchair      inadequate  [x]  scooter/POV inadequate       []  requires dependent mobility           MANUAL MOBILITY       []  Standard manual wheelchair  K0001      Arm:    []  both []  right  []  left      Foot:   []  both []  right   []  left  []  self-propels wheelchair  []  will use on regular basis  []  chair fits throughout home  []  willing and motivated to use  []  propels with assistance      []  dependent use   []  Standard hemi-manual wheelchair  X9997      Arm:    []  both []  right  []  left      Foot:   []  both []  right   []  left  []  lower seat height required to foot propel  []  short stature  []  self-propels wheelchair  []  will use on regular basis  []  chair fits throughout home  []  willing and motivated to use    []  propels with assistance  []  dependent use   []  Lightweight manual wheelchair  K0003      Arm:    []  both []  right  []  left      Foot:   []  both  []  right  []  left                   []  hemi height required  []  medical condition and weight of  wheelchair affect ability to self      propel standard manual wheelchair in the residence  []  can and does self-propel (marginal propulsion skills)  []  daily use _________hours  []  chair fits throughout home  []  willing and motivated to use  []  lower seat  height required to foot propel  []  short stature   []  High strength lightweight manual  wheelchair (Breezy Ultra 4)  K0004     Arm:    []  both []  right  []  left     Foot:   []  both []  right   []  left                                                                  []  hemi height required []  medical condition and weight of wheelchair affect ability to self propel while engaging in frequent MRADL(s) that cannot be performed in a standard or lightweight manual wheelchair  []  daily use _________hours  []  chair fits throughout home  []  willing and motivated to use  []  prevent repetitive use injuries    []  lower seat height required to foot propel  []  short stature     []  Ultra-lightweight manual wheelchair  K0005     Arm:    []  both []  right  []  left     Foot:   []  both []  right  []  left       []  hemi height required  []  heavy duty    Front seat to floor _____ inches      Rear seat to floor _____ inches      Back height _____ inches     Back angle ______ degrees      Front angle _____ degrees  []   full-time manual wheelchair user  []  Requires individualized fitting and optimal adjustments for multiple features that include adjustable axle configuration, fully adjustable center of gravity, wheel camber, seat and back angle, angle of seat slope, which cannot be accommodated by a K0001 through K0004 manual wheelchair  []  prevent repetitive use injuries  []  daily use_________hours    []  user has high activity patterns that frequently require  them  to go out into the community for the purpose of independently accomplishing high level MRADL activities. Examples of these might include a combination of; shopping, work, school, Photographer, childcare, independently loading and unloading from a vehicle etc.  []   lower seat height required to foot propel  []  short stature  []  heavy duty -  weight over 250lbs   []  Current chair is a K0005   manufacture:___________________  model:_________________   serial#____________________  age:_________      []  First time X9994 user (complete trial)  K0004 time and # of strokes to propel 30 feet: ________seconds _________strokes  X9994 time and # of strokes to propel 30 feet: ________seconds _________strokes  What was the result of the trial between the K0004 and K0005 manual wheelchair? ___       What features of the K0005 w/c are needed as compared to the K0004 base? Why?___       []  adjustable seat and back angle changes the angle of seat slope of the frame to attain a gravity assisted position for efficient propulsion and proper weight distribution along the frame     []  the front of the wheelchair will be configured higher than the back of the chair to allow gravity to assist the user with postural stability  []  the center of the wheel will be positioned for stability, safety and efficient propulsion  []  adjustable axle allows for vertical, horizontal, camber and overall width changes  throughout the wheels for adjustment of the client's exact needs and abilities.   []  adjustable axle increases the stability and function of the chair allowing for adjustment of the center of gravity.   []  accommodates the client's anatomical position in the chair maximizing independence in mobility and maneuverability in all environments.   []  create a minimal fixed tilt-in space to assist in positioning.   []  Describe users full-time manual wheelchair activity patterns:___          []  Power assist Comments:  []  prevent repetitive use injuries  []  repetitive strain injury present in    shoulder girdle             []  shoulder pain is (> or =) to 7/10     during manual propulsion       Current Pain _____/10  []  requires conservation of energy to participate in MRADL(s) runable to propel up ramps or curbs using manual wheelchair  []  been K0005 user greater than one year  []  user unwilling to use power      wheelchair (reason): []  less expensive option to  power   wheelchair    []  rim activated power assist -      decreased strength   []  Heavy duty manual wheelchair       K0006     Arm:    []  both []  right  []  left     Foot:   []  both []  right  []  left     []  hemi height required    []  Dependent base  []  user exceeds 250lbs  []  non-functional ambulator    []  extreme spasticity  []  over active movement   []  broken frame/hx of repeated     repairs  []  able to self-propel in residence        []  lower seat to floor height required  []  unable to self-propel in residence   []  Extra heavy duty manual wheelchair  K0007     Arm:    []  both []  right  []  left     Foot:   []  both []  right  []  left     []  hemi height required  []  Dependent base  []  user exceeds 300lbs  []  non-functional ambulator    []   able to self-propel in residence    []  lower seat to floor height required  []  unable to self-propel in residence     []  Manual wheelchair with tilt [phone]      (Manual "Tilt-n-Space")  []  patient is dependent for transfers  []  patient requires frequent       positioning for pressure relief    []  patient requires frequent      positioning for poor/absent trunk control        []  Stroller Base  []  infant/child   []  unable to propel manual      wheelchair  []  allows for growth  []  non-functional ambulator  []  non-functional UE  []  independent mobility is not a goal at this time     MANUAL FRAME OPTIONS       Push handles  []  extended   []  angle adjustable   []  standard  []  caregiver access  []  caregiver assist    []  allows "hooking" to enable      increased ability to perform ADLs or maintain balance   []  Angle Adjustable Back  []  postural control  []  control of tone/spasticity  []  accommodation of range of motion  []  UE functional control  []  accommodation for seating system     Rear wheel placement  []  std/fixed  [] fully adjustableramputee   []  camber ________degree  []  removable rear wheel  []  non-removable rear wheel  Wheel size  _______  Wheel style_______________________  []  improved UE access to wheels  []  increase propulsion ability  []  improved stability  []  changing angle in space for      improvement of postural stability  []  remove for transport    []  allow for seating system to fit on  base  []  amputee placement  []  1-arm drive access   r R  r L  []  enable propulsion of manual       wheelchair with one arm    []  amputee placement   Wheel rims/ Hand rims  []  Standard    []  Specialized-____ []  provide ability to propel manual   []  increase self-propulsion with hand wheelchair weakness/decreased grasp     []  Spoke protector/guard   []  prevent hands from getting caught in spokes   Tires:  []  pneumatic  []  flat free inserts  []  solid  Style:  []  decrease roll resistance                     []  prevent frequent flats  []  increase shock absorbency   []  decrease maintenance   []  decrease pain from road shock           []  decrease spasms from road shock    Wheel Locks:    []  push []  pull []  scissor  []  lock wheels for transfers        []  lock wheels from rolling   Brake/wheel lock extension:  []  R  []  L  []  allow user to operate wheel locks due to decreased reach or strength   Caster housing:  Caster size:                      Style:                                          []  suspension fork  []  maneuverability   []   stability of wheelchair   []  durability  []  maintenance  []  angle adjustment for posture  []  allow for feet to come under        wheelchair base  []  allows change in seat to floor  height    []  increase shock absorbency  []  decrease pain from road shock  []  decrease spasms from road    shock   []  Side guards  []  prevent clothing getting caught in wheel or becoming soiled   [] provide hip and pelvic stability  []  eliminates contact between body and wheels  []  limit hand contact with wheels   []  Anti-tippers      []  prevent wheelchair from tipping    backward  []  assist caregiver with  curbs     POWER MOBILITY       []  Scooter/POV    []  can safely operate   []  can safely transfer   []  has adequate trunk stability   []  cannot functionally propel  manual wheelchair     [x]  Power mobility base    []  non-ambulatory   [x]  cannot functionally propel manual wheelchair   [x]  cannot functionally and safely      operate scooter/POV  [x]  can safely operate power       wheelchair  [x]  home is accessible  [x]  willing to use power wheelchair     Tilt  []  Powered tilt on powered chair  []  Powered tilt on manual chair  []  Manual tilt on manual chair Comments:  []  change position for pressure      []  elief/cannot weight shift   []  change position against      gravitational force on head and      shoulders   []  decrease pain  []  blood pressure management   []  control autonomic dysreflexia  []  decrease respiratory distress  []  management of spasticity  []  management of low tone  []  facilitate postural control   []  rest periods   []  control edema  []  increase sitting tolerance   []  aid with transfers            Recline   []  Power recline on power chair  []  Manual recline on manual chair  Comments:    []  intermittent catheterization  []  manage spasticity  []  accommodate femur to back angle  []  change position for pressure relief/cannot weight shift rhigh risk of pressure sore development  []  tilt alone does not accomplish     effective pressure relief, maximum pressure relief achieved at -      _______ degrees tilt   _______ degrees recline    []  difficult to transfer to and from bed []  rest periods and sleeping in chair  []  repositioning for transfers  []  bring to full recline for ADL care  []  clothing/diaper changes in chair  []  gravity PEG tube feeding  []  head positioning  []  decrease pain  []  blood pressure management   []  control autonomic dysreflexia  []  decrease respiratory distress  []  user on ventilator     Elevator on mobility base  []  Power  wheelchair  []  Scooter  []  increase Indep in transfers   []  increase Indep in ADLs    []  bathroom function and safety  []  kitchen/cooking function and safety  []  shopping  []  raise height for communication at standing level  []  raise height for eye contact which reduces cervical neck strain and pain  []  drive at raised height for safety and  navigating crowds  []  Other:   []  Vertical position system  (anterior tilt)     (Drive locks-out)    []  Stand       (Drive enabled)  []  independent weight bearing  []  decrease joint contractures  []  decrease/manage spasticity  []  decrease/manage spasms  []  pressure distribution away from   scapula, sacrum, coccyx, and ischial tuberosity  []  increase digestion and elimination   []  access to counters and cabinets  []  increase reach  []  increase interaction with others at eye level, reduces neck strain  []  increase performance of       MRADL(s)       Power elevating legrest    []  Center mount (Single) 85-170 degrees       []  Standard (Pair) 100-170 degrees  []  position legs at 90 degrees, not available with std power ELR  []  center mount tucks into chair to decrease turning radius in home, not available with std power ELR  []  provide change in position for LE  []  elevate legs during recline    []  maintain placement of feet on      footplate  []  decrease edema  []  improve circulation  []  actuator needed to elevate legrest  []  actuator needed to articulate legrest preventing knees from flexing  []  Increase ground clearance over      curbs  []   STD (pair) independently                     elevate legrest   POWER WHEELCHAIR CONTROLS       Controls/input device  []  Expandable  [x]  Non-expandable  []  Proportional  []  Right Hand []  Left Hand  []  Non-proportional/switches/head-array  []  Electrical/proximity         []   Mechanical      Manufacturer:___________________   Type:________________________ [x]  provides access for controlling  wheelchair  []  programming for accurate control  []  progressive disease/changing condition  []  required for alternative drive      controls       []  lacks motor control to operate  proportional drive control  []  unable to understand proportional controls  []  limited movement/strength  []  extraneous movement / tremors / ataxic / spastic       []  Upgraded electronics controller/harness    []  Single power (tilt or recline)   []  Expandable    []  Non-expandable plus   []  Multi-power (tilt, recline, power legrest, power seat lift, vertical positioning system, stand)  []  allows input device to communicate with drive motors  []  harness provides necessary connections between the controller, input device, and seat functions      []  needed in order to operate power seat functions through joystick/ input device  []  required for alternative drive controls     []  Enhanced display  []  required to connect all alternative drive controls   []  required for upgraded joystick      (lite-throw, heavy duty, micro)  []  Allows user to see in which mode and drive the wheelchair is set; necessary for alternate controls       []  Upgraded tracking electronics  []  correct tracking when on uneven surfaces makes switch driving more efficient and less fatiguing  []  increase safety when driving  []  increase ability to traverse thresholds    []  Safety / reset / mode switches     Type:    []  Used to change modes and stop the wheelchair when driving     [  x] [facility] for joystick / input device/switches  [x]  swing away for access or transfers   [x]  attaches joystick / input device / switches to wheelchair   []  provides for consistent access  []  midline for optimal placement     []  Attendant controlled joystick plus     mount  []  safety  []  long distance driving  []  operation of seat functions  []  compliance with transportation regulations     [x]  Battery U1 x 2 [x]  required to power (power assist / scooter/ power wc /  other):   []  Power inverter (24V to 12V)  []  required for ventilator / respiratory equipment / other:          CHAIR OPTIONS MANUAL & POWER       Armrests   [x]  adjustable height []  removable  []  swing away []  fixed  [x]  flip back  []  reclining  []  full length pads []  desk []  tube arms []  gel pads  [x]  provide support with elbow at 90    [x]  remove/flip back/swing away for  transfers  [x]  provide support and positioning of upper body    [x]  allow to come closer to table top  []  remove for access to tables  [x]  provide support for w/c tray  []  change of height/angles for variable activities   []  Elbow support / Elbow stop  []  keep elbow positioned on arm pad  []  keep arms from falling off arm pad  during tilt and/or recline   Upper Extremity Support  []  Arm trough  []   R  []   L  Style:  []  swivel mount []  fixed mount   []  posterior hand support  []   tray  [x]  full tray  []  joystick cut out  []   R  []   L  Style:  [x]  decrease gravitational pull on      shoulders  [x]  provide support to increase UE  function  []  provide hand support in natural    position  []  position flaccid UE  []  decrease subluxation    []  decrease edema       []  manage spasticity   []  provide midline positioning  [x]  provide work surface  []  placement for AAC/ Computer/ EADL             Hangers/ Legrests   []  ______ degree  []  Elevating []  articulating  []  swing away []  fixed []  lift off  []  heavy duty  []  adjustable knee angle  []  adjustable calf panel   []  longer extension tube              []  provide LE support  []  maintain placement of feet on      footplate   []  accommodate lower leg length  []  accommodate to hamstring       tightness  []  enable transfers  []  provide change in position for LE's  []  elevate legs during recline    []  decrease edema  []  durability       Foot support   [x]  footplate []  R []  L [x]  flip up           []  Depth adjustable   []  angle adjustable  []  foot  board/one piece    [x]  provide foot support  []  accommodate to ankle ROM  [x]  allow foot to go under wheelchair base  []  enable transfers      []  Shoe holders  []  position foot    []   decrease / manage spasticity  []  control position of LE  []  stability             []  safety      []  Ankle strap/heel      loops  []  support foot on foot support  []  decrease extraneous movement  []  provide input to heel   []  protect foot     []  Amputee adapter []  R  []  L     Style:                  Size:  []  Provide support for stump/residual extremity     []  Transportation tie-down  []  to provide crash tested tie-down brackets    []  Crutch/cane holder    []  O2 holder    []  IV hanger   []  Ventilator tray/mount    []  stabilize accessory on wheelchair       Component  Justification                [x]  Seat cushion             []  accommodate impaired sensation  []  decubitus ulcers present or history  []  unable to shift weight  []  increase pressure distribution  []  prevent pelvic extension  []  custom required "off-the-shelf"    seat cushion will not accommodate deformity  [x]  stabilize/promote pelvis alignment  [x]  stabilize/promote femur alignment  []  accommodate obliquity  []  accommodate multiple deformity  []  incontinent/accidents  [x]  low maintenance      []  seat mounts                 []  fixed []  removable  []  attach seat platform/cushion to wheelchair frame    []  Seat wedge              []  provide increased aggressiveness of seat shape to decrease sliding  down in the seat  []  accommodate ROM        []  Cover replacement   []  protect back or seat cushion  []  incontinent/accidents    []  Solid seat / insert     []  support cushion to prevent      hammocking  []  allows attachment of cushion to mobility base    []  Lateral pelvic/thigh/hip     support (Guides)      []  decrease abduction  []  accommodate pelvis  []  position upper legs  []  accommodate spasticity  []  removable for transfers      []   Lateral pelvic/thigh      supports mounts  []  fixed   []  swing-away   []  removable  []  mounts lateral pelvic/thigh supports     []  mounts lateral pelvic/thigh supports swing-away or removable for transfers    []  Medial thigh support (Pommel)  [] decrease adduction  [] accommodate ROM  []  remove for transfers   []  alignment           []  Medial thigh             []  fixed      support mounts      []  swing-away   []  removable  []  mounts medial thigh supports   []  Mounts medial supports swing- away or removable for transfers             Component  Justification   [x]  Back       [x]  provide posterior trunk support []  facilitate tone  [x]  provide lumbar/sacral support []  accommodate deformity  [x]  support  trunk in midline          []  custom required "off-the-shelf" back support will not accommodate deformity   []  provide lateral trunk support []  accommodate or decrease tone                             []  Back mounts  []  fixed  []  removable  []  attach back rest/cushion to wheelchair frame   []  Lateral trunk      supports  []  R []  L  []  decrease lateral trunk leaning  []  accommodate asymmetry       []  contour for increased contact  []  safety    []  control of tone     []  Lateral trunk      supports mounts  []  fixed  []  swing-away   []  removable  []  mounts lateral trunk supports     []  Mounts lateral trunk supports swing-away or removable for transfers   []  Anterior chest      strap, vest     []  decrease forward movement of shoulder  []  decrease forward movement of trunk  []  safety/stability  []  added abdominal support  []  trunk alignment  []  assistance with shoulder control   []  decrease shoulder elevation     []  Headrest                []  provide posterior head support  []  provide posterior neck support  []  provide lateral head support  []  provide anterior head support  []  support during tilt and recline  []  improve feeding           []  improve respiration  []  placement of switches   []  safety    []  accommodate ROM   []  accommodate tone  []  improve visual orientation   []  Headrest           []  fixed []  removable []  flip down      Mounting hardware   []  swing-away laterals/switches  []  mount headrest   []  mounts headrest flip down or  removable for transfers  []  mount headrest swing-away laterals   []  mount switches     []  Neck Support           []  decrease neck rotation  []  decrease forward neck flexion   Pelvic Positioner       [x]  std hip belt          []  padded hip belt  []  dual pull hip belt  []  four point hip belt  []  stabilize tone  [x]  decrease falling out of chair  []  prevent excessive extension  []  special pull angle to control      rotation  []  pad for protection over boney   prominence  []  promote comfort     []  Essential needs                   bag/pouch   []  medicines []  special food rorthotics []  clothing changes  []  diapers  []  catheter/hygiene []  ostomy supplies   The above equipment has a life- long use expectancy.  Growth and changes in medical and/or functional conditions would be the exceptions.   SUMMARY:     ASSESSMENT: Functional testing: TUG: 96 seconds with 2 WW.   CLINICAL IMPRESSION: Patient is a 83 y.o. female who was seen today for physical therapy evaluation and treatment for power mobility assessment. Patient has PMH that includes  bil LE gout, O2 dependency,  shoulder pain, knee pain, and debility. Patient is currently using rolling walker for in home mobility. Based on Timed up and go test score of 96 sec with Rolator, patient's mobility is significantly impaired and is at high risk for fall considering her independent living status. Pt currently is dependant on family members for cooking, cleaning, showers, ADLs, and MRADLs. Any type of manual wheelchair is not applicable considering her age and comorbidity. Least expensive option that will meet patiet's medical neccessasity, improve independence, and decrease caregiver  dependence will be group 2 power wheelchair with a full tray to provide a working surface for carry things as she moves from room to room.     OBJECTIVE IMPAIRMENTS Abnormal gait, cardiopulmonary status limiting activity, decreased activity tolerance, decreased balance, decreased endurance, decreased knowledge of use of DME, decreased mobility, difficulty walking, decreased strength, increased edema, increased muscle spasms, impaired flexibility, impaired UE functional use, and pain.    ACTIVITY LIMITATIONS carrying, lifting, bending, standing, stairs, transfers, bed mobility, bathing, and dressing   PARTICIPATION LIMITATIONS: meal prep, cleaning, laundry, driving, shopping, and community activity   PERSONAL FACTORS Age, Time since onset of injury/illness/exacerbation, Transportation, and 3+ comorbidities: gout, LE swelling, knee DJD, shoulder pain are also affecting patient's functional outcome.    REHAB POTENTIAL: Good   CLINICAL DECISION MAKING: Stable/uncomplicated   EVALUATION COMPLEXITY: Moderate                                    GOALS: One time visit. No goals established.       PLAN: PT FREQUENCY: one time visit       Raj LOISE Blanch, PT 07/04/2024, 10:45 AM     I concur with the above findings and recommendations of the therapist:      Physician name printed:                   Physician's signature:      Date:      Review of Systems  Constitutional: Negative.   HENT: Negative.    Eyes: Negative.   Respiratory:  Negative for cough, chest tightness and shortness of breath.   Cardiovascular:  Negative for chest pain, palpitations and leg swelling.  Gastrointestinal:  Negative for abdominal distention, abdominal pain, constipation, diarrhea, nausea and vomiting.  Musculoskeletal:  Positive for arthralgias, back pain, gait problem and myalgias.  Skin: Negative.   Neurological:  Positive for weakness.  Psychiatric/Behavioral: Negative.       Objective:  Physical Exam Constitutional:      Appearance: She is well-developed.  HENT:     Head: Normocephalic and atraumatic.  Cardiovascular:     Rate and Rhythm: Normal rate and regular rhythm.  Pulmonary:     Effort: Pulmonary effort is normal. No respiratory distress.     Breath sounds: Normal breath sounds. No wheezing or rales.  Abdominal:     General: Bowel sounds are normal. There is no distension.     Palpations: Abdomen is soft.     Tenderness: There is no abdominal tenderness. There is no rebound.  Musculoskeletal:        General: Tenderness present.     Cervical back: Normal range of motion.  Skin:    General: Skin is warm and dry.  Neurological:     Mental Status: She is alert and oriented to person, place, and time.  Motor: Weakness present.     Coordination: Coordination abnormal.     Comments: See copied PT note for exact measurements on strength of upper and lower extremities.      Vitals:   07/14/24 0912  BP: 134/68  Pulse: (!) 54  Temp: 98 F (36.7 C)  TempSrc: Oral  SpO2: 98%  Height: 4' 9 (1.448 m)   Assessment and Plan Assessment & Plan Impaired mobility requiring power wheelchair   Chronic impaired mobility with no significant changes since August 5th. Current aids are insufficient. A power wheelchair is expected to improve independence, reduce pain, and mitigate falls. Proceed with acquisition of a power wheelchair as recommended by physical therapy. Coordinate with home health company and insurance for wheelchair fulfillment.

## 2024-07-17 ENCOUNTER — Ambulatory Visit: Payer: Self-pay | Admitting: Internal Medicine

## 2024-08-02 DIAGNOSIS — J45998 Other asthma: Secondary | ICD-10-CM | POA: Diagnosis not present

## 2024-08-04 ENCOUNTER — Telehealth: Payer: Self-pay

## 2024-08-04 ENCOUNTER — Other Ambulatory Visit: Payer: Self-pay | Admitting: Licensed Clinical Social Worker

## 2024-08-04 NOTE — Patient Outreach (Signed)
 Complex Care Management   Visit Note  08/04/2024  Name:  Heather Grant  MRN: [id number] DOB: November 22, 1941  Situation: Referral received for Complex Care Management related to Stress I obtained verbal consent from Patient.  Visit completed with Patient  on the phone  Background:   Past Medical History:  Diagnosis Date   Anemia    Arthritis    hands and knees. May be hips   Gout    great toe, ankles   History of home oxygen  therapy    prn   Hyperlipidemia    Hypertension    Migraines    Moderate persistent asthma    Nocturnal dyspnea    ONO 2010 with several readings  below 88%   Obesity, Class II, BMI 35-39.9    OSA (obstructive sleep apnea)    mild by study Dec '09. AHI 5.7   Plantar fasciitis    Varicella     Assessment: Patient Reported Symptoms:  Cognitive Cognitive Status: Alert and oriented to person, place, and time, Normal speech and language skills Cognitive/Intellectual Conditions Management [RPT]: None reported or documented in medical history or problem list   Health Maintenance Behaviors: Annual physical exam  Neurological Neurological Review of Symptoms: No symptoms reported    HEENT HEENT Symptoms Reported: No symptoms reported      Cardiovascular Cardiovascular Symptoms Reported: No symptoms reported    Respiratory Respiratory Symptoms Reported: No symptoms reported    Endocrine Endocrine Symptoms Reported: No symptoms reported Is patient diabetic?: No    Gastrointestinal Gastrointestinal Symptoms Reported: No symptoms reported      Genitourinary Genitourinary Symptoms Reported: No symptoms reported    Integumentary Integumentary Symptoms Reported: No symptoms reported    Musculoskeletal Musculoskelatal Symptoms Reviewed: Limited mobility Musculoskeletal Management Strategies: Adequate rest, Coping strategies, Routine screening, Weight management Falls in the past year?: No Number of falls in past year: 1 or less Was there an injury with  Fall?: No Fall Risk Category Calculator: 0 Patient Fall Risk Level: Low Fall Risk Patient at Risk for Falls Due to: Impaired mobility  Psychosocial Psychosocial Symptoms Reported: No symptoms reported Behavioral Management Strategies: Coping strategies, Support system Major Change/Loss/Stressor/Fears (CP): Medical condition, self Techniques to Cope with Loss/Stress/Change: Diversional activities, Medication      08/04/2024    PHQ2-9 Depression Screening   Little interest or pleasure in doing things    Feeling down, depressed, or hopeless    PHQ-2 - Total Score    Trouble falling or staying asleep, or sleeping too much    Feeling tired or having little energy    Poor appetite or overeating     Feeling bad about yourself - or that you are a failure or have let yourself or your family down    Trouble concentrating on things, such as reading the newspaper or watching television    Moving or speaking so slowly that other people could have noticed.  Or the opposite - being so fidgety or restless that you have been moving around a lot more than usual    Thoughts that you would be better off dead, or hurting yourself in some way    PHQ2-9 Total Score    If you checked off any problems, how difficult have these problems made it for you to do your work, take care of things at home, or get along with other people    Depression Interventions/Treatment      There were no vitals filed for this visit.  Medications Reviewed Today  Reviewed by Heather Grant BIRCH, LCSW (Social Worker) on 08/04/24 at 1136  Med List Status: <None>   Medication Order Taking? Sig Documenting Provider Last Dose Status Informant  acetaminophen  (TYLENOL ) 325 MG tablet [id number]  Take 2 tablets (650 mg total) by mouth every 6 (six) hours as needed for mild pain (pain score 1-3) (or Fever >/= 101). Heather Reichert, MD  Active Self  albuterol  (VENTOLIN  HFA) 108 (90 Base) MCG/ACT inhaler [id number]  USE 1 TO 2 INHALATIONS BY  MOUTH  EVERY 6 HOURS AS NEEDED FOR  WHEEZING OR SHORTNESS OF Heather Heather Almarie DELENA, MD  Active Self  colchicine  0.6 MG tablet [id number]  Take 0.6 mg by mouth daily. [provider]  Active Self  hydrALAZINE  (APRESOLINE ) 25 MG tablet [id number]  Take 1 tablet (25 mg total) by mouth 3 (three) times daily. Heather Almarie DELENA, MD  Active   LUMIGAN 0.01 % SOLN [id number]  Place 1 drop into both eyes at bedtime. [provider]  Active Self  mometasone -formoterol  (DULERA ) 100-5 MCG/ACT AERO [id number]  USE 2 INHALATIONS BY MOUTH TWICE DAILY Heather Almarie DELENA, MD  Active   OXYGEN  [id number]  Inhale 2 L into the lungs at bedtime as needed (shortness of breath). [provider]  Active Self           Med Note (Heather Grant, Heather Grant   Thu Dec 02, 2023 11:29 AM) 12/02/23: Reports during Heather Grant call, has at her home-- reports uses as needed; caregiver- daughter reports she has not needed often lately  Polyvinyl Alcohol (LUBRICANT DROPS OP) [id number]  Place 1 drop into both eyes daily. [provider]  Active Self  rosuvastatin  (CRESTOR ) 20 MG tablet [id number]  Take 1 tablet (20 mg total) by mouth daily. Heather Almarie DELENA, MD  Active   traMADol  (ULTRAM ) 50 MG tablet [id number]  Take 50 mg by mouth every 6 (six) hours as needed for moderate pain (pain score 4-6). [provider]  Active Self  VOLTAREN 1 % GEL [id number]  Apply 2 g topically daily as needed (knee pain).  [provider]  Active Self           Med Note EPIFANIO, Heather Grant   Fri Oct 12, 2014  3:34 PM)              Recommendation:   Continue Current Plan of Care  Follow Up Plan:   Telephone follow-up in 1 month  Grant Ezzard, LCSW [facility]  [facility], [facility] Clinical Social Worker Direct Dial: [phone]  Fax: [phone] Website: [email or web address] 11:57 AM

## 2024-08-04 NOTE — Patient Instructions (Signed)
 Visit Information  Thank you for taking time to visit with me today. Please don't hesitate to contact me if I can be of assistance to you before our next scheduled appointment.  Your next care management appointment is by telephone on 10/10 at 11:30 AM  Please call the care guide team at [phone] if you need to cancel, schedule, or reschedule an appointment.   Please call the Suicide and Crisis Lifeline: 988 go to [facility] Urgent [facility] [address], [place] [phone]) call 911 if you are experiencing a Mental Health or Behavioral Health Crisis or need someone to talk to.  Rolin Kerns, LCSW [place]  [facility], [facility] Clinical Social Worker Direct Dial: [phone]  Fax: [phone] Website: [email or web address] 11:58 AM

## 2024-08-07 ENCOUNTER — Telehealth: Payer: Self-pay

## 2024-08-07 NOTE — Telephone Encounter (Signed)
 Forms from [facility] [employer] have been fax back successfully @10 :15 am

## 2024-08-07 NOTE — Telephone Encounter (Signed)
 Forms were sent back successfully

## 2024-08-13 ENCOUNTER — Other Ambulatory Visit: Payer: Self-pay | Admitting: Internal Medicine

## 2024-08-13 DIAGNOSIS — J45901 Unspecified asthma with (acute) exacerbation: Secondary | ICD-10-CM

## 2024-08-14 ENCOUNTER — Other Ambulatory Visit: Payer: Self-pay | Admitting: Internal Medicine

## 2024-08-18 ENCOUNTER — Telehealth: Payer: Self-pay

## 2024-08-18 NOTE — Telephone Encounter (Signed)
 Second part of forms from [employer] and [employer] have been fax back successfully    `

## 2024-09-01 DIAGNOSIS — R0602 Shortness of breath: Secondary | ICD-10-CM | POA: Diagnosis not present

## 2024-09-01 DIAGNOSIS — J45998 Other asthma: Secondary | ICD-10-CM | POA: Diagnosis not present

## 2024-09-08 ENCOUNTER — Other Ambulatory Visit: Payer: Self-pay | Admitting: Licensed Clinical Social Worker

## 2024-09-18 NOTE — Patient Instructions (Signed)
 Visit Information  Thank you for taking time to visit with me today. Please don't hesitate to contact me if I can be of assistance to you before our next scheduled appointment.  Your next care management appointment is by telephone on 11/21 at 11:30 AM  Please call the care guide team at [phone] if you need to cancel, schedule, or reschedule an appointment.   Please call the Suicide and Crisis Lifeline: 988 go to [facility] Urgent [facility] [address] [place] [phone]) call 911 if you are experiencing a Mental Health or Behavioral Health Crisis or need someone to talk to.  Rolin Kerns, LCSW [place]  [facility], [facility] Clinical Social Worker Direct Dial: [phone]  Fax: [phone] Website: [email or web address] 5:17 AM

## 2024-09-18 NOTE — Patient Outreach (Signed)
 Complex Care Management   Visit Note  09/08/2024  Name:  Heather Grant  MRN: [id number] DOB: Mar 11, 1941  Situation: Referral received for Complex Care Management related to Stress I obtained verbal consent from Patient.  Visit completed with Patient  on the phone  Background:   Past Medical History:  Diagnosis Date   Anemia    Arthritis    hands and knees. May be hips   Gout    great toe, ankles   History of home oxygen  therapy    prn   Hyperlipidemia    Hypertension    Migraines    Moderate persistent asthma    Nocturnal dyspnea    ONO 2010 with several readings  below 88%   Obesity, Class II, BMI 35-39.9    OSA (obstructive sleep apnea)    mild by study Dec '09. AHI 5.7   Plantar fasciitis    Varicella     Assessment: Patient Reported Symptoms:  Cognitive Cognitive Status: No symptoms reported, Alert and oriented to person, place, and time, Normal speech and language skills      Neurological Neurological Review of Symptoms: Not assessed    HEENT HEENT Symptoms Reported: Not assessed      Cardiovascular Cardiovascular Symptoms Reported: Not assessed    Respiratory Respiratory Symptoms Reported: Not assesed    Endocrine Endocrine Symptoms Reported: Not assessed    Gastrointestinal Gastrointestinal Symptoms Reported: Not assessed      Genitourinary Genitourinary Symptoms Reported: Not assessed    Integumentary Integumentary Symptoms Reported: Not assessed    Musculoskeletal Musculoskelatal Symptoms Reviewed: Not assessed, Limited mobility, Joint pain Musculoskeletal Comment: Pt continues to endorse knee and leg pain. States injections are ineffective and she will continue working with specialist to obtain w/c      Psychosocial Psychosocial Symptoms Reported: No symptoms reported          09/18/2024    PHQ2-9 Depression Screening   Little interest or pleasure in doing things    Feeling down, depressed, or hopeless    PHQ-2 - Total Score     Trouble falling or staying asleep, or sleeping too much    Feeling tired or having little energy    Poor appetite or overeating     Feeling bad about yourself - or that you are a failure or have let yourself or your family down    Trouble concentrating on things, such as reading the newspaper or watching television    Moving or speaking so slowly that other people could have noticed.  Or the opposite - being so fidgety or restless that you have been moving around a lot more than usual    Thoughts that you would be better off dead, or hurting yourself in some way    PHQ2-9 Total Score    If you checked off any problems, how difficult have these problems made it for you to do your work, take care of things at home, or get along with other people    Depression Interventions/Treatment      There were no vitals filed for this visit.  Medications Reviewed Today     Reviewed by Ezzard Rolin BIRCH, LCSW (Social Worker) on 09/18/24 at 0510  Med List Status: <None>   Medication Order Taking? Sig Documenting Provider Last Dose Status Informant  acetaminophen  (TYLENOL ) 325 MG tablet [id number]  Take 2 tablets (650 mg total) by mouth every 6 (six) hours as needed for mild pain (pain score 1-3) (or Fever >/= 101). Dahal, Binaya,  MD  Active Self  albuterol  (VENTOLIN  HFA) 108 (90 Base) MCG/ACT inhaler [id number]  USE 1 TO 2 INHALATIONS BY MOUTH  EVERY 6 HOURS AS NEEDED FOR  WHEEZING OR SHORTNESS OF SHERIDA Rollene Almarie DELENA, MD  Active   colchicine  0.6 MG tablet [id number]  TAKE 1 TABLET BY MOUTH DAILY FOR GOUT FLARE Rollene Almarie DELENA, MD  Active   hydrALAZINE  (APRESOLINE ) 25 MG tablet [id number]  Take 1 tablet (25 mg total) by mouth 3 (three) times daily. Rollene Almarie DELENA, MD  Active   LUMIGAN 0.01 % SOLN [id number]  Place 1 drop into both eyes at bedtime. [provider]  Active Self  mometasone -formoterol  (DULERA ) 100-5 MCG/ACT AERO [id number]  USE 2 INHALATIONS BY MOUTH TWICE DAILY  Rollene Almarie DELENA, MD  Active   OXYGEN  [id number]  Inhale 2 L into the lungs at bedtime as needed (shortness of breath). [provider]  Active Self           Med Note (TOUSEY, LAINE M   Thu Dec 02, 2023 11:29 AM) 12/02/23: Reports during TOC call, has at her home-- reports uses as needed; caregiver- daughter reports she has not needed often lately  Polyvinyl Alcohol (LUBRICANT DROPS OP) [id number]  Place 1 drop into both eyes daily. [provider]  Active Self  rosuvastatin  (CRESTOR ) 20 MG tablet [id number]  Take 1 tablet (20 mg total) by mouth daily. Rollene Almarie DELENA, MD  Active   traMADol  (ULTRAM ) 50 MG tablet [id number]  Take 50 mg by mouth every 6 (six) hours as needed for moderate pain (pain score 4-6). [provider]  Active Self  VOLTAREN 1 % GEL [id number]  Apply 2 g topically daily as needed (knee pain).  [provider]  Active Self           Med Note EPIFANIO, SHARENE BIRCH   Fri Oct 12, 2014  3:34 PM)              Recommendation:   Continue Current Plan of Care  Follow Up Plan:   Telephone follow-up in 1 month  Rolin Kerns, LCSW [facility]  [facility], [facility] Clinical Social Worker Direct Dial: [phone]  Fax: [phone] Website: [email or web address] 5:16 AM

## 2024-09-20 ENCOUNTER — Ambulatory Visit: Admitting: Podiatry

## 2024-09-20 ENCOUNTER — Encounter: Payer: Self-pay | Admitting: Podiatry

## 2024-09-20 DIAGNOSIS — M79676 Pain in unspecified toe(s): Secondary | ICD-10-CM | POA: Diagnosis not present

## 2024-09-20 DIAGNOSIS — B351 Tinea unguium: Secondary | ICD-10-CM | POA: Diagnosis not present

## 2024-09-20 NOTE — Progress Notes (Signed)
  Subjective:  Patient ID: Heather Grant , female    DOB: [birth year],  MRN: [id number]  83 y.o. female presents painful elongated mycotic toenails 1-5 bilaterally which are tender when wearing enclosed shoe gear. Pain is relieved with periodic professional debridement. Chief Complaint  Patient presents with   RFC    RFC Non diabetic toenail trim. LOV with PCP 07/14/24.   New problem(s): None   PCP is Rollene Almarie LABOR, MD.  No Known Allergies  Review of Systems: Negative except as noted in the HPI.   Objective:  Heather Grant  is a pleasant 83 y.o. female in NAD. AAO x 3.  Vascular Examination: Vascular status intact b/l with palpable pedal pulses. CFT immediate b/l. Pedal hair present. No edema. No pain with calf compression b/l. Skin temperature gradient WNL b/l. No varicosities noted. No cyanosis or clubbing noted.  Neurological Examination: Sensation grossly intact b/l with 10 gram monofilament. Vibratory sensation intact b/l.  Dermatological Examination: Pedal skin with normal turgor, texture and tone b/l. No open wounds nor interdigital macerations noted. Toenails 1-5 b/l thick, discolored, elongated with subungual debris and pain on dorsal palpation. No hyperkeratotic lesions noted b/l.   Musculoskeletal Examination: Muscle strength 5/5 to b/l LE.  No pain, crepitus noted b/l. HAV with bunion deformity noted b/l LE. Hammertoe(s) 2-5 b/l. Ambulates with cane assistance.   Radiographs: None  Last A1c:      Latest Ref Rng & Units 07/14/2024    9:42 AM  Hemoglobin A1C  Hemoglobin-A1c 4.6 - 6.5 % 5.0    Assessment:   1. Pain due to onychomycosis of toenail    Plan:  Consent given for treatment. Patient examined. All patient's and/or POA's questions/concerns addressed on today's visit. Mycotic toenails 1-5 b/l debrided in length and girth without incident. Continue soft, supportive shoe gear daily. Report any pedal injuries to medical professional. Call  office if there are any quesitons/concerns. -Patient/POA to call should there be question/concern in the interim.  Return in about 3 months (around 12/21/2024).  Heather Grant, DPM      [place] LOCATION: [address], [place] [address]                   Office [phone]   [facility] LOCATION: [address] [place], [place] [address] Office [phone]

## 2024-09-25 DIAGNOSIS — R200 Anesthesia of skin: Secondary | ICD-10-CM | POA: Diagnosis not present

## 2024-09-25 DIAGNOSIS — M150 Primary generalized (osteo)arthritis: Secondary | ICD-10-CM | POA: Diagnosis not present

## 2024-09-25 DIAGNOSIS — J4540 Moderate persistent asthma, uncomplicated: Secondary | ICD-10-CM | POA: Diagnosis not present

## 2024-10-20 ENCOUNTER — Other Ambulatory Visit: Payer: Self-pay | Admitting: Licensed Clinical Social Worker

## 2024-10-20 NOTE — Patient Instructions (Signed)
 Visit Information  Thank you for taking time to visit with me today. Please don't hesitate to contact me if I can be of assistance to you before our next scheduled appointment.  Your next care management appointment is by telephone on 12/19 at 11:30 AM  Please call the care guide team at [phone] if you need to cancel, schedule, or reschedule an appointment.   Please call the Suicide and Crisis Lifeline: 988 go to [facility] Urgent [facility] [address], [place] [phone]) call 911 if you are experiencing a Mental Health or Behavioral Health Crisis or need someone to talk to.  Rolin Kerns, LCSW [place]  [facility], [facility] Clinical Social Worker Direct Dial: [phone]  Fax: [phone] Website: [email or web address] 4:37 PM

## 2024-10-20 NOTE — Patient Outreach (Signed)
 Complex Care Management   Visit Note  10/20/2024  Name:  Heather Grant  MRN: [id number] DOB: 07/11/1941  Situation: Referral received for Complex Care Management related to Stress I obtained verbal consent from Patient.  Visit completed with Patient  on the phone  Background:   Past Medical History:  Diagnosis Date   Anemia    Arthritis    hands and knees. May be hips   Gout    great toe, ankles   History of home oxygen  therapy    prn   Hyperlipidemia    Hypertension    Migraines    Moderate persistent asthma    Nocturnal dyspnea    ONO 2010 with several readings  below 88%   Obesity, Class II, BMI 35-39.9    OSA (obstructive sleep apnea)    mild by study Dec '09. AHI 5.7   Plantar fasciitis    Varicella     Assessment: Patient Reported Symptoms:  Cognitive Cognitive Status: No symptoms reported, Alert and oriented to person, place, and time, Normal speech and language skills Cognitive/Intellectual Conditions Management [RPT]: None reported or documented in medical history or problem list      Neurological Neurological Review of Symptoms: No symptoms reported    HEENT HEENT Symptoms Reported: No symptoms reported      Cardiovascular Cardiovascular Symptoms Reported: No symptoms reported    Respiratory Respiratory Symptoms Reported: No symptoms reported    Endocrine Endocrine Symptoms Reported: No symptoms reported Is patient diabetic?: No    Gastrointestinal Gastrointestinal Symptoms Reported: No symptoms reported, Constipation Additional Gastrointestinal Details: Patient will take laxatives, as needed Gastrointestinal Management Strategies: Medication therapy, Coping strategies Gastrointestinal Comment: Occassional constipation    Genitourinary Genitourinary Symptoms Reported: No symptoms reported    Integumentary Integumentary Symptoms Reported: No symptoms reported    Musculoskeletal Musculoskelatal Symptoms Reviewed: Limited mobility, Joint  pain Musculoskeletal Management Strategies: Adequate rest, Medical device, Routine screening, Medication therapy, Coping strategies Musculoskeletal Comment: Pt received a power w/c approx 2 weeks ago. She continues to utilize tiger balm to assist with joint pain, as needed Falls in the past year?: No Patient at Risk for Falls Due to: Impaired mobility  Psychosocial Psychosocial Symptoms Reported: No symptoms reported Behavioral Management Strategies: Coping strategies, Support system Major Change/Loss/Stressor/Fears (CP): Medical condition, self      10/20/2024    PHQ2-9 Depression Screening   Little interest or pleasure in doing things    Feeling down, depressed, or hopeless    PHQ-2 - Total Score    Trouble falling or staying asleep, or sleeping too much    Feeling tired or having little energy    Poor appetite or overeating     Feeling bad about yourself - or that you are a failure or have let yourself or your family down    Trouble concentrating on things, such as reading the newspaper or watching television    Moving or speaking so slowly that other people could have noticed.  Or the opposite - being so fidgety or restless that you have been moving around a lot more than usual    Thoughts that you would be better off dead, or hurting yourself in some way    PHQ2-9 Total Score    If you checked off any problems, how difficult have these problems made it for you to do your work, take care of things at home, or get along with other people    Depression Interventions/Treatment      There were no  vitals filed for this visit.    Medications Reviewed Today     Reviewed by Ezzard Rolin BIRCH, LCSW (Social Worker) on 10/20/24 at 1143  Med List Status: <None>   Medication Order Taking? Sig Documenting Provider Last Dose Status Informant  acetaminophen  (TYLENOL ) 325 MG tablet [id number]  Take 2 tablets (650 mg total) by mouth every 6 (six) hours as needed for mild pain (pain score 1-3) (or  Fever >/= 101). Arlice Reichert, MD  Active Self  albuterol  (VENTOLIN  HFA) 108 (90 Base) MCG/ACT inhaler [id number]  USE 1 TO 2 INHALATIONS BY MOUTH  EVERY 6 HOURS AS NEEDED FOR  WHEEZING OR SHORTNESS OF SHERIDA Rollene Almarie DELENA, MD  Active   colchicine  0.6 MG tablet [id number]  TAKE 1 TABLET BY MOUTH DAILY FOR GOUT FLARE Rollene Almarie DELENA, MD  Active   hydrALAZINE  (APRESOLINE ) 25 MG tablet [id number]  Take 1 tablet (25 mg total) by mouth 3 (three) times daily. Rollene Almarie DELENA, MD  Active   LUMIGAN 0.01 % SOLN [id number]  Place 1 drop into both eyes at bedtime. [provider]  Active Self  mometasone -formoterol  (DULERA ) 100-5 MCG/ACT AERO [id number]  USE 2 INHALATIONS BY MOUTH TWICE DAILY Rollene Almarie DELENA, MD  Active   OXYGEN  [id number]  Inhale 2 L into the lungs at bedtime as needed (shortness of breath). [provider]  Active Self           Med Note (TOUSEY, LAINE M   Thu Dec 02, 2023 11:29 AM) 12/02/23: Reports during TOC call, has at her home-- reports uses as needed; caregiver- daughter reports she has not needed often lately  Polyvinyl Alcohol (LUBRICANT DROPS OP) [id number]  Place 1 drop into both eyes daily. [provider]  Active Self  rosuvastatin  (CRESTOR ) 20 MG tablet [id number]  Take 1 tablet (20 mg total) by mouth daily. Rollene Almarie DELENA, MD  Active   traMADol  (ULTRAM ) 50 MG tablet [id number]  Take 50 mg by mouth every 6 (six) hours as needed for moderate pain (pain score 4-6). [provider]  Active Self  VOLTAREN 1 % GEL [id number]  Apply 2 g topically daily as needed (knee pain).  [provider]  Active Self           Med Note EPIFANIO, SHARENE BIRCH   Fri Oct 12, 2014  3:34 PM)              Recommendation:   Continue Current Plan of Care  Follow Up Plan:   Telephone follow-up in 1 month  Rolin Ezzard, LCSW [facility] Health  [facility], [facility] Clinical Social Worker Direct Dial: [phone]  Fax: [phone] Website: [email or web address] 4:37 PM

## 2024-11-17 ENCOUNTER — Telehealth: Admitting: Licensed Clinical Social Worker

## 2024-11-17 NOTE — Patient Outreach (Signed)
 Complex Care Management   Visit Note  11/17/2024  Name:  Heather Grant  MRN: [id number] DOB: Nov 23, 1941  Situation: Referral received for Complex Care Management related to Stress I obtained verbal consent from Patient.  Visit completed with Patient  on the phone  Background:   Past Medical History:  Diagnosis Date   Anemia    Arthritis    hands and knees. May be hips   Gout    great toe, ankles   History of home oxygen  therapy    prn   Hyperlipidemia    Hypertension    Migraines    Moderate persistent asthma    Nocturnal dyspnea    ONO 2010 with several readings  below 88%   Obesity, Class II, BMI 35-39.9    OSA (obstructive sleep apnea)    mild by study Dec '09. AHI 5.7   Plantar fasciitis    Varicella     Assessment: Patient Reported Symptoms:  Cognitive Cognitive Status: No symptoms reported, Alert and oriented to person, place, and time, Normal speech and language skills Cognitive/Intellectual Conditions Management [RPT]: None reported or documented in medical history or problem list      Neurological Neurological Review of Symptoms: Not assessed    HEENT HEENT Symptoms Reported: Eye pain HEENT Management Strategies: Coping strategies, Routine screening HEENT Comment: Pt endorsed soreness to left eye, agreed to f/up with Dr. Octavia    Cardiovascular Cardiovascular Symptoms Reported: No symptoms reported    Respiratory Respiratory Symptoms Reported: No symptoms reported    Endocrine Endocrine Symptoms Reported: No symptoms reported Is patient diabetic?: No    Gastrointestinal Gastrointestinal Symptoms Reported: Constipation Gastrointestinal Management Strategies: Coping strategies, Medication therapy    Genitourinary Genitourinary Symptoms Reported: No symptoms reported    Integumentary Integumentary Symptoms Reported: No symptoms reported    Musculoskeletal Musculoskelatal Symptoms Reviewed: Limited mobility, Joint pain Musculoskeletal Management  Strategies: Coping strategies, Medication therapy, Routine screening, Medical device Musculoskeletal Comment: Patient reports fingers are locking up occassionally resulting in being sore   Patient at Risk for Falls Due to: Impaired mobility  Psychosocial Psychosocial Symptoms Reported: Other Other Psychosocial Conditions: Pain Behavioral Management Strategies: Adequate rest, Coping strategies, Support system, Medication therapy Major Change/Loss/Stressor/Fears (CP): Medical condition, self Techniques to Cope with Loss/Stress/Change: Diversional activities, Spiritual practice(s)      11/17/2024    PHQ2-9 Depression Screening   Little interest or pleasure in doing things    Feeling down, depressed, or hopeless    PHQ-2 - Total Score    Trouble falling or staying asleep, or sleeping too much    Feeling tired or having little energy    Poor appetite or overeating     Feeling bad about yourself - or that you are a failure or have let yourself or your family down    Trouble concentrating on things, such as reading the newspaper or watching television    Moving or speaking so slowly that other people could have noticed.  Or the opposite - being so fidgety or restless that you have been moving around a lot more than usual    Thoughts that you would be better off dead, or hurting yourself in some way    PHQ2-9 Total Score    If you checked off any problems, how difficult have these problems made it for you to do your work, take care of things at home, or get along with other people    Depression Interventions/Treatment      There were no vitals  filed for this visit.    Medications Reviewed Today     Reviewed by Donise Woodle D, LCSW (Social Worker) on 11/17/24 at 1341  Med List Status: <None>   Medication Order Taking? Sig Documenting Provider Last Dose Status Informant  acetaminophen  (TYLENOL ) 325 MG tablet [id number]  Take 2 tablets (650 mg total) by mouth every 6 (six) hours as  needed for mild pain (pain score 1-3) (or Fever >/= 101). Arlice Reichert, MD  Active Self  albuterol  (VENTOLIN  HFA) 108 (90 Base) MCG/ACT inhaler [id number]  USE 1 TO 2 INHALATIONS BY MOUTH  EVERY 6 HOURS AS NEEDED FOR  WHEEZING OR SHORTNESS OF SHERIDA Rollene Almarie DELENA, MD  Active   colchicine  0.6 MG tablet [id number]  TAKE 1 TABLET BY MOUTH DAILY FOR GOUT FLARE Rollene Almarie DELENA, MD  Active   hydrALAZINE  (APRESOLINE ) 25 MG tablet [id number]  Take 1 tablet (25 mg total) by mouth 3 (three) times daily. Rollene Almarie DELENA, MD  Active   LUMIGAN 0.01 % SOLN [id number]  Place 1 drop into both eyes at bedtime. [provider]  Active Self  mometasone -formoterol  (DULERA ) 100-5 MCG/ACT AERO [id number]  USE 2 INHALATIONS BY MOUTH TWICE DAILY Rollene Almarie DELENA, MD  Active   OXYGEN  [id number]  Inhale 2 L into the lungs at bedtime as needed (shortness of breath). [provider]  Active Self           Med Note (TOUSEY, LAINE M   Thu Dec 02, 2023 11:29 AM) 12/02/23: Reports during TOC call, has at her home-- reports uses as needed; caregiver- daughter reports she has not needed often lately  Polyvinyl Alcohol (LUBRICANT DROPS OP) [id number]  Place 1 drop into both eyes daily. [provider]  Active Self  rosuvastatin  (CRESTOR ) 20 MG tablet [id number]  Take 1 tablet (20 mg total) by mouth daily. Rollene Almarie DELENA, MD  Active   traMADol  (ULTRAM ) 50 MG tablet [id number]  Take 50 mg by mouth every 6 (six) hours as needed for moderate pain (pain score 4-6). [provider]  Active Self  VOLTAREN 1 % GEL [id number]  Apply 2 g topically daily as needed (knee pain).  [provider]  Active Self           Med Note EPIFANIO, SHARENE BIRCH   Fri Oct 12, 2014  3:34 PM)              Recommendation:   PCP Follow-up Continue Current Plan of Care  Follow Up Plan:   Telephone follow-up in 1 month  Rolin Kerns, LCSW [facility]  [facility],  [facility] Clinical Social Worker Direct Dial: [phone]  Fax: [phone] Website: [email or web address] 1:52 PM

## 2024-11-17 NOTE — Patient Instructions (Signed)
 Visit Information  Thank you for taking time to visit with me today. Please don't hesitate to contact me if I can be of assistance to you before our next scheduled appointment.  Your next care management appointment is by telephone on 1/30 at 11:30 AM  Please call the care guide team at [phone] if you need to cancel, schedule, or reschedule an appointment.   Please call the Suicide and Crisis Lifeline: 988 go to [facility] Urgent [facility] [address] [place] [phone]) call 911 if you are experiencing a Mental Health or Behavioral Health Crisis or need someone to talk to.  Rolin Kerns, LCSW  [facility]  [facility], [facility] Clinical Social Worker Direct Dial: [phone]  Fax: [phone] Website: [email or web address] 1:52 PM

## 2024-12-05 ENCOUNTER — Telehealth: Payer: Self-pay

## 2024-12-05 NOTE — Telephone Encounter (Signed)
 Copied from CRM [id number]. Topic: Clinical - Request for Lab/Test Order >> Dec 05, 2024 10:53 AM Winona R wrote: Pt needs lab orders prior to scheduling. >> Dec 05, 2024 10:58 AM Winona R wrote: Pt daughter states the pt need what ever blood work the provider believes the pt may need.

## 2024-12-07 ENCOUNTER — Encounter: Payer: Self-pay | Admitting: Internal Medicine

## 2024-12-08 NOTE — Telephone Encounter (Signed)
 Dr. Rollene requested additional information concerning this matter. Messaged daughter and she provided the additional information. This has been forwarded to Dr. Rollene. Awaiting a response.

## 2024-12-12 ENCOUNTER — Encounter: Payer: Self-pay | Admitting: Internal Medicine

## 2024-12-20 ENCOUNTER — Encounter: Payer: Self-pay | Admitting: Internal Medicine

## 2024-12-20 ENCOUNTER — Ambulatory Visit: Admitting: Internal Medicine

## 2024-12-20 VITALS — BP 138/80 | HR 56 | Temp 97.9°F | Ht <= 58 in | Wt 159.6 lb

## 2024-12-20 DIAGNOSIS — N824 Other female intestinal-genital tract fistulae: Secondary | ICD-10-CM | POA: Diagnosis not present

## 2024-12-20 DIAGNOSIS — K5904 Chronic idiopathic constipation: Secondary | ICD-10-CM | POA: Diagnosis not present

## 2024-12-20 DIAGNOSIS — I10 Essential (primary) hypertension: Secondary | ICD-10-CM

## 2024-12-20 DIAGNOSIS — J4540 Moderate persistent asthma, uncomplicated: Secondary | ICD-10-CM

## 2024-12-20 LAB — CBC WITH DIFFERENTIAL/PLATELET
Basophils Absolute: 0.1 K/uL (ref 0.0–0.1)
Basophils Relative: 1.1 % (ref 0.0–3.0)
Eosinophils Absolute: 0.1 K/uL (ref 0.0–0.7)
Eosinophils Relative: 2.9 % (ref 0.0–5.0)
HCT: 38.3 % (ref 36.0–46.0)
Hemoglobin: 13 g/dL (ref 12.0–15.0)
Lymphocytes Relative: 44.4 % (ref 12.0–46.0)
Lymphs Abs: 2.2 K/uL (ref 0.7–4.0)
MCHC: 33.9 g/dL (ref 30.0–36.0)
MCV: 93.8 fl (ref 78.0–100.0)
Monocytes Absolute: 0.3 K/uL (ref 0.1–1.0)
Monocytes Relative: 6.3 % (ref 3.0–12.0)
Neutro Abs: 2.3 K/uL (ref 1.4–7.7)
Neutrophils Relative %: 45.3 % (ref 43.0–77.0)
Platelets: 270 K/uL (ref 150.0–400.0)
RBC: 4.08 Mil/uL (ref 3.87–5.11)
RDW: 13.4 % (ref 11.5–15.5)
WBC: 5 K/uL (ref 4.0–10.5)

## 2024-12-20 LAB — COMPREHENSIVE METABOLIC PANEL WITH GFR
ALT: 23 U/L (ref 3–35)
AST: 28 U/L (ref 5–37)
Albumin: 4 g/dL (ref 3.5–5.2)
Alkaline Phosphatase: 59 U/L (ref 39–117)
BUN: 15 mg/dL (ref 6–23)
CO2: 31 meq/L (ref 19–32)
Calcium: 9.9 mg/dL (ref 8.4–10.5)
Chloride: 106 meq/L (ref 96–112)
Creatinine, Ser: 1.09 mg/dL (ref 0.40–1.20)
GFR: 47.53 mL/min — ABNORMAL LOW
Glucose, Bld: 88 mg/dL (ref 70–99)
Potassium: 4.1 meq/L (ref 3.5–5.1)
Sodium: 140 meq/L (ref 135–145)
Total Bilirubin: 0.7 mg/dL (ref 0.2–1.2)
Total Protein: 6.3 g/dL (ref 6.0–8.3)

## 2024-12-20 LAB — SEDIMENTATION RATE: Sed Rate: 8 mm/h (ref 0–30)

## 2024-12-20 LAB — C-REACTIVE PROTEIN: CRP: <0.5 mg/dL — ABNORMAL LOW (ref 1.0–20.0)

## 2024-12-20 NOTE — Patient Instructions (Signed)
 We will check the labs today and get the ultrasound done.   Try fiber over the counter to help with going to the bathroom more regular.

## 2024-12-20 NOTE — Progress Notes (Signed)
 "  Subjective:   Patient ID: Heather Grant , female    DOB: 1941-04-20, 84 y.o.   MRN: [id number]  Discussed the use of AI scribe software for clinical note transcription with the patient, who gave verbal consent to proceed.  History of Present Illness Heather Grant  is an 84 year old female who presents for ongoing follow-up of discharge related to a previous fistula. She is accompanied by [name], her daughter.  She experiences ongoing clear discharge primarily when she needs to urinate and occasionally if she does not reach the bathroom in time. The discharge does not soak through a panty liner or menstrual pad, and she changes the pad two to three times a day, mainly when using the bathroom. The discharge does not have an odor.  She has constipation, feeling the need to have a bowel movement more frequently than she currently does. She uses a gel pill, possibly a stool softener, but it has not been effective. She has not been using fiber supplements like Metamucil or Benefiber. Her diet includes processed meats and potato chips.  She uses albuterol  and Dulera  for asthma management, typically in the morning, and occasionally uses a nebulizer. She has an oxygen  machine at home, which she uses occasionally, but it is unclear if it is still necessary. No new breathing troubles or chest pains are reported.  Her sleep is inconsistent, and she sometimes has difficulty sleeping. She has not tried over-the-counter sleep aids like melatonin or Z-Quil.  Her appetite has improved, and she is eating and drinking well. She experiences some leg pain, which she attributes to the cold weather, but her mobility is about the same as usual. No significant changes in mobility are reported.  Review of Systems  Constitutional: Negative.   HENT: Negative.    Eyes: Negative.   Respiratory:  Negative for cough, chest tightness and shortness of breath.   Cardiovascular:  Negative for chest pain,  palpitations and leg swelling.  Gastrointestinal:  Negative for abdominal distention, abdominal pain, constipation, diarrhea, nausea and vomiting.  Genitourinary:  Positive for vaginal discharge.  Musculoskeletal: Negative.   Skin: Negative.   Neurological: Negative.   Psychiatric/Behavioral: Negative.      Objective:  Physical Exam Constitutional:      Appearance: She is well-developed.  HENT:     Head: Normocephalic and atraumatic.  Cardiovascular:     Rate and Rhythm: Normal rate and regular rhythm.  Pulmonary:     Effort: Pulmonary effort is normal. No respiratory distress.     Breath sounds: Normal breath sounds. No wheezing or rales.  Abdominal:     General: Bowel sounds are normal. There is no distension.     Palpations: Abdomen is soft.     Tenderness: There is no abdominal tenderness.  Musculoskeletal:        General: Tenderness present.     Cervical back: Normal range of motion.  Skin:    General: Skin is warm and dry.  Neurological:     Mental Status: She is alert and oriented to person, place, and time.     Coordination: Coordination abnormal.     Vitals:   12/20/24 1102  BP: 138/80  Pulse: (!) 56  Temp: 97.9 F (36.6 C)  TempSrc: Oral  SpO2: 98%  Weight: 159 lb 9.6 oz (72.4 kg)  Height: 4' 9 (1.448 m)    Assessment and Plan Assessment & Plan Colouterine fistula   She experiences ongoing clear, watery discharge primarily during urination, without pelvic  pain. Previous scans showed improvement. A pelvic ultrasound is ordered to assess for drainage issues or fluid accumulation, and blood work is ordered to check for signs of infection or other abnormalities. She will follow up with a gynecologist for further evaluation and management.  Essential hypertension Checking CMP and labs today. BP at goal on regimen adjust as needed.  Chronic constipation   She has increased bowel movement frequency with incomplete evacuation, and the current stool softener  is ineffective. Her diet is high in processed foods and low in fiber. Starting Metamucil or Benefiber is recommended to increase dietary fiber intake. Increasing fluid intake and incorporating more fruits, vegetables, and fiber-rich foods into her diet is advised.  Asthma   Her asthma is moderate persistent and well-controlled with albuterol  and Dulera , with no recent exacerbations or increased rescue inhaler use. Oxygen  therapy is not regularly used. She should continue current asthma management with albuterol  and Dulera . A nighttime oxygen  study is considered to reassess the need for oxygen  therapy.   "

## 2024-12-22 ENCOUNTER — Ambulatory Visit: Payer: Self-pay | Admitting: Internal Medicine

## 2024-12-22 DIAGNOSIS — K5900 Constipation, unspecified: Secondary | ICD-10-CM | POA: Insufficient documentation

## 2024-12-22 NOTE — Telephone Encounter (Signed)
 Message reviewed through MyChart.

## 2024-12-27 ENCOUNTER — Other Ambulatory Visit: Payer: Self-pay | Admitting: Internal Medicine

## 2024-12-29 ENCOUNTER — Other Ambulatory Visit: Payer: Self-pay | Admitting: Licensed Clinical Social Worker

## 2025-01-03 ENCOUNTER — Ambulatory Visit: Admitting: Podiatry

## 2025-01-04 NOTE — Patient Instructions (Signed)
 Visit Information  Thank you for taking time to visit with me today. Please don't hesitate to contact me if I can be of assistance to you before our next scheduled appointment.  Your next care management appointment is by telephone on 2/27 at 12:30 PM  Please call the care guide team at [phone] if you need to cancel, schedule, or reschedule an appointment.   Please call the Suicide and Crisis Lifeline: 988 go to [facility] Urgent [facility] [address], [place] [phone]) call 911 if you are experiencing a Mental Health or Behavioral Health Crisis or need someone to talk to.  Rolin Kerns, LCSW [place]  [facility], [facility] Clinical Social Worker Direct Dial: [phone]  Fax: [phone] Website: [email or web address] 7:43 AM

## 2025-01-04 NOTE — Patient Outreach (Signed)
 Complex Care Management   Visit Note  12/29/2024  Name:  Heather Grant  MRN: [id number] DOB: 1941-05-06  Situation: Referral received for Complex Care Management related to Stress I obtained verbal consent from Patient.  Visit completed with Patient  on the phone  Background:   Past Medical History:  Diagnosis Date   Anemia    Arthritis    hands and knees. May be hips   Gout    great toe, ankles   History of home oxygen  therapy    prn   Hyperlipidemia    Hypertension    Migraines    Moderate persistent asthma    Nocturnal dyspnea    ONO 2010 with several readings  below 88%   Obesity, Class II, BMI 35-39.9    OSA (obstructive sleep apnea)    mild by study Dec '09. AHI 5.7   Plantar fasciitis    Varicella     Assessment: Patient Reported Symptoms:  Cognitive Cognitive Status: No symptoms reported, Alert and oriented to person, place, and time, Normal speech and language skills Cognitive/Intellectual Conditions Management [RPT]: None reported or documented in medical history or problem list   Health Maintenance Behaviors: Annual physical exam  Neurological Neurological Review of Symptoms: Not assessed    HEENT HEENT Symptoms Reported: Eye pain HEENT Management Strategies: Medication therapy, Routine screening HEENT Comment: Patient reports eyes are sore, has an upcoming appt with provider on 01/03/25    Cardiovascular Cardiovascular Symptoms Reported: Not assessed    Respiratory Respiratory Symptoms Reported: Not assesed    Endocrine Endocrine Symptoms Reported: Not assessed    Gastrointestinal Gastrointestinal Symptoms Reported: Not assessed      Genitourinary Genitourinary Symptoms Reported: Not assessed    Integumentary Integumentary Symptoms Reported: Not assessed    Musculoskeletal Musculoskelatal Symptoms Reviewed: Not assessed        Psychosocial Psychosocial Symptoms Reported: No symptoms reported Behavioral Management Strategies: Adequate rest,  Coping strategies, Support system Major Change/Loss/Stressor/Fears (CP): Medical condition, self      01/04/2025    PHQ2-9 Depression Screening   Little interest or pleasure in doing things    Feeling down, depressed, or hopeless    PHQ-2 - Total Score    Trouble falling or staying asleep, or sleeping too much    Feeling tired or having little energy    Poor appetite or overeating     Feeling bad about yourself - or that you are a failure or have let yourself or your family down    Trouble concentrating on things, such as reading the newspaper or watching television    Moving or speaking so slowly that other people could have noticed.  Or the opposite - being so fidgety or restless that you have been moving around a lot more than usual    Thoughts that you would be better off dead, or hurting yourself in some way    PHQ2-9 Total Score    If you checked off any problems, how difficult have these problems made it for you to do your work, take care of things at home, or get along with other people    Depression Interventions/Treatment      There were no vitals filed for this visit.    Medications Reviewed Today     Reviewed by Ezzard Rolin BIRCH, LCSW (Social Worker) on 01/04/25 at [phone]  Med List Status: <None>   Medication Order Taking? Sig Documenting Provider Last Dose Status Informant  acetaminophen  (TYLENOL ) 325 MG tablet [id number]  Take  2 tablets (650 mg total) by mouth every 6 (six) hours as needed for mild pain (pain score 1-3) (or Fever >/= 101). Arlice Reichert, MD  Active Self  albuterol  (VENTOLIN  HFA) 108 (90 Base) MCG/ACT inhaler [id number]  USE 1 TO 2 INHALATIONS BY MOUTH  EVERY 6 HOURS AS NEEDED FOR  WHEEZING OR SHORTNESS OF SHERIDA Rollene Almarie DELENA, MD  Active   colchicine  0.6 MG tablet [id number]  TAKE 1 TABLET BY MOUTH DAILY FOR GOUT FLARE Rollene Almarie DELENA, MD  Active   hydrALAZINE  (APRESOLINE ) 25 MG tablet [id number]  TAKE 1 TABLET BY MOUTH 3 TIMES  DAILY Rollene Almarie DELENA, MD  Active   LUMIGAN 0.01 % SOLN [id number]  Place 1 drop into both eyes at bedtime. [provider]  Active Self  mometasone -formoterol  (DULERA ) 100-5 MCG/ACT AERO [id number]  USE 2 INHALATIONS BY MOUTH TWICE DAILY Rollene Almarie DELENA, MD  Active   OXYGEN  [id number]  Inhale 2 L into the lungs at bedtime as needed (shortness of breath). [provider]  Active Self           Med Note (TOUSEY, LAINE M   Thu Dec 02, 2023 11:29 AM) 12/02/23: Reports during TOC call, has at her home-- reports uses as needed; caregiver- daughter reports she has not needed often lately  Polyvinyl Alcohol (LUBRICANT DROPS OP) [id number]  Place 1 drop into both eyes daily. [provider]  Active Self  rosuvastatin  (CRESTOR ) 20 MG tablet [id number]  Take 1 tablet (20 mg total) by mouth daily. Rollene Almarie DELENA, MD  Active   traMADol  (ULTRAM ) 50 MG tablet [id number]  Take 50 mg by mouth every 6 (six) hours as needed for moderate pain (pain score 4-6). [provider]  Active Self  VOLTAREN 1 % GEL [id number]  Apply 2 g topically daily as needed (knee pain).  [provider]  Active Self           Med Note EPIFANIO, SHARENE BIRCH   Fri Oct 12, 2014  3:34 PM)              Recommendation:   Continue Current Plan of Care  Follow Up Plan:   Telephone follow-up in 1 month  Rolin Kerns, LCSW [facility]  [facility], [facility] Clinical Social Worker Direct Dial: [phone]  Fax: [phone] Website: [email or web address] 7:42 AM

## 2025-01-26 ENCOUNTER — Telehealth: Admitting: Licensed Clinical Social Worker

## 2025-02-16 ENCOUNTER — Encounter: Admitting: Internal Medicine

## 2025-02-16 ENCOUNTER — Ambulatory Visit: Payer: 59

## 2025-04-04 ENCOUNTER — Ambulatory Visit: Admitting: Podiatry
